# Patient Record
Sex: Female | Born: 1935 | Race: White | Hispanic: No | Smoking: Former smoker
Health system: Southern US, Community
[De-identification: ages and names within clinical notes are randomized; demographics above are authoritative.]

## PROBLEM LIST (undated history)

## (undated) DIAGNOSIS — B0229 Other postherpetic nervous system involvement: Secondary | ICD-10-CM

## (undated) DIAGNOSIS — G629 Polyneuropathy, unspecified: Secondary | ICD-10-CM

## (undated) DIAGNOSIS — E785 Hyperlipidemia, unspecified: Secondary | ICD-10-CM

## (undated) DIAGNOSIS — I4892 Unspecified atrial flutter: Secondary | ICD-10-CM

## (undated) DIAGNOSIS — M869 Osteomyelitis, unspecified: Secondary | ICD-10-CM

## (undated) DIAGNOSIS — I480 Paroxysmal atrial fibrillation: Secondary | ICD-10-CM

## (undated) DIAGNOSIS — R208 Other disturbances of skin sensation: Secondary | ICD-10-CM

## (undated) DIAGNOSIS — D649 Anemia, unspecified: Secondary | ICD-10-CM

## (undated) DIAGNOSIS — Z89621 Acquired absence of right hip joint: Secondary | ICD-10-CM

## (undated) DIAGNOSIS — N183 Chronic kidney disease, stage 3 unspecified: Secondary | ICD-10-CM

## (undated) DIAGNOSIS — I10 Essential (primary) hypertension: Secondary | ICD-10-CM

## (undated) DIAGNOSIS — S7290XA Unspecified fracture of unspecified femur, initial encounter for closed fracture: Secondary | ICD-10-CM

## (undated) DIAGNOSIS — I455 Other specified heart block: Secondary | ICD-10-CM

## (undated) DIAGNOSIS — I5032 Chronic diastolic (congestive) heart failure: Secondary | ICD-10-CM

## (undated) DIAGNOSIS — Z950 Presence of cardiac pacemaker: Secondary | ICD-10-CM

## (undated) DIAGNOSIS — G4730 Sleep apnea, unspecified: Secondary | ICD-10-CM

## (undated) DIAGNOSIS — K219 Gastro-esophageal reflux disease without esophagitis: Secondary | ICD-10-CM

## (undated) DIAGNOSIS — H9190 Unspecified hearing loss, unspecified ear: Secondary | ICD-10-CM

## (undated) DIAGNOSIS — K922 Gastrointestinal hemorrhage, unspecified: Secondary | ICD-10-CM

## (undated) DIAGNOSIS — I2510 Atherosclerotic heart disease of native coronary artery without angina pectoris: Secondary | ICD-10-CM

## (undated) DIAGNOSIS — E079 Disorder of thyroid, unspecified: Secondary | ICD-10-CM

## (undated) DIAGNOSIS — I495 Sick sinus syndrome: Secondary | ICD-10-CM

## (undated) HISTORY — PX: TUBAL LIGATION: SHX77

## (undated) HISTORY — DX: Atherosclerotic heart disease of native coronary artery without angina pectoris: I25.10

## (undated) HISTORY — PX: LEG AMPUTATION: SHX1105

## (undated) HISTORY — DX: Chronic kidney disease, stage 3 (moderate): N18.3

## (undated) HISTORY — DX: Polyneuropathy, unspecified: G62.9

## (undated) HISTORY — PX: SEPTOPLASTY: SUR1290

## (undated) HISTORY — DX: Acquired absence of right hip joint: Z89.621

## (undated) HISTORY — DX: Gastro-esophageal reflux disease without esophagitis: K21.9

## (undated) HISTORY — DX: Unspecified fracture of unspecified femur, initial encounter for closed fracture: S72.90XA

## (undated) HISTORY — DX: Presence of cardiac pacemaker: Z95.0

## (undated) HISTORY — DX: Chronic kidney disease, stage 3 unspecified: N18.30

## (undated) HISTORY — PX: APPENDECTOMY: SHX54

## (undated) HISTORY — PX: COLONOSCOPY: SHX174

## (undated) HISTORY — PX: ABDOMINAL HYSTERECTOMY: SHX81

## (undated) HISTORY — DX: Other disturbances of skin sensation: R20.8

## (undated) HISTORY — PX: EYE SURGERY: SHX253

## (undated) HISTORY — DX: Sick sinus syndrome: I49.5

## (undated) HISTORY — DX: Chronic diastolic (congestive) heart failure: I50.32

## (undated) HISTORY — DX: Osteomyelitis, unspecified: M86.9

## (undated) HISTORY — DX: Gastrointestinal hemorrhage, unspecified: K92.2

## (undated) HISTORY — DX: Other postherpetic nervous system involvement: B02.29

## (undated) HISTORY — DX: Disorder of thyroid, unspecified: E07.9

## (undated) HISTORY — DX: Anemia, unspecified: D64.9

---

## 1998-05-16 ENCOUNTER — Other Ambulatory Visit: Admission: RE | Admit: 1998-05-16 | Discharge: 1998-05-16 | Payer: Self-pay | Admitting: Obstetrics and Gynecology

## 1998-08-16 ENCOUNTER — Encounter: Payer: Self-pay | Admitting: Orthopedic Surgery

## 1998-08-21 ENCOUNTER — Encounter: Payer: Self-pay | Admitting: Orthopedic Surgery

## 1998-08-21 ENCOUNTER — Inpatient Hospital Stay (HOSPITAL_COMMUNITY): Admission: RE | Admit: 1998-08-21 | Discharge: 1998-08-27 | Payer: Self-pay | Admitting: Orthopedic Surgery

## 1999-07-07 ENCOUNTER — Other Ambulatory Visit: Admission: RE | Admit: 1999-07-07 | Discharge: 1999-07-07 | Payer: Self-pay | Admitting: Obstetrics and Gynecology

## 2001-02-23 ENCOUNTER — Ambulatory Visit (HOSPITAL_COMMUNITY): Admission: RE | Admit: 2001-02-23 | Discharge: 2001-02-23 | Payer: Self-pay | Admitting: Gastroenterology

## 2001-08-18 ENCOUNTER — Other Ambulatory Visit: Admission: RE | Admit: 2001-08-18 | Discharge: 2001-08-18 | Payer: Self-pay | Admitting: Obstetrics and Gynecology

## 2001-12-14 ENCOUNTER — Encounter: Admission: RE | Admit: 2001-12-14 | Discharge: 2002-03-14 | Payer: Self-pay | Admitting: Orthopedic Surgery

## 2002-03-15 ENCOUNTER — Encounter: Admission: RE | Admit: 2002-03-15 | Discharge: 2002-03-16 | Payer: Self-pay | Admitting: Orthopedic Surgery

## 2002-04-06 HISTORY — PX: KNEE ARTHROSCOPY: SUR90

## 2002-08-21 ENCOUNTER — Inpatient Hospital Stay (HOSPITAL_COMMUNITY): Admission: EM | Admit: 2002-08-21 | Discharge: 2002-08-23 | Payer: Self-pay | Admitting: Emergency Medicine

## 2002-08-21 ENCOUNTER — Encounter: Payer: Self-pay | Admitting: Emergency Medicine

## 2002-10-04 ENCOUNTER — Encounter: Payer: Self-pay | Admitting: Orthopedic Surgery

## 2002-10-06 ENCOUNTER — Inpatient Hospital Stay (HOSPITAL_COMMUNITY): Admission: RE | Admit: 2002-10-06 | Discharge: 2002-10-12 | Payer: Self-pay | Admitting: Orthopedic Surgery

## 2003-04-13 ENCOUNTER — Encounter: Admission: RE | Admit: 2003-04-13 | Discharge: 2003-04-13 | Payer: Self-pay | Admitting: Orthopedic Surgery

## 2003-04-16 ENCOUNTER — Ambulatory Visit (HOSPITAL_BASED_OUTPATIENT_CLINIC_OR_DEPARTMENT_OTHER): Admission: RE | Admit: 2003-04-16 | Discharge: 2003-04-16 | Payer: Self-pay | Admitting: Orthopedic Surgery

## 2003-04-16 ENCOUNTER — Ambulatory Visit (HOSPITAL_COMMUNITY): Admission: RE | Admit: 2003-04-16 | Discharge: 2003-04-16 | Payer: Self-pay | Admitting: Orthopedic Surgery

## 2003-06-13 ENCOUNTER — Ambulatory Visit (HOSPITAL_BASED_OUTPATIENT_CLINIC_OR_DEPARTMENT_OTHER): Admission: RE | Admit: 2003-06-13 | Discharge: 2003-06-13 | Payer: Self-pay | Admitting: Orthopedic Surgery

## 2003-06-13 ENCOUNTER — Ambulatory Visit (HOSPITAL_COMMUNITY): Admission: RE | Admit: 2003-06-13 | Discharge: 2003-06-13 | Payer: Self-pay | Admitting: Orthopedic Surgery

## 2004-02-07 ENCOUNTER — Ambulatory Visit: Payer: Self-pay | Admitting: Internal Medicine

## 2004-02-15 ENCOUNTER — Ambulatory Visit: Payer: Self-pay | Admitting: Internal Medicine

## 2004-04-06 HISTORY — PX: REPLACEMENT TOTAL KNEE: SUR1224

## 2004-08-20 ENCOUNTER — Ambulatory Visit: Payer: Self-pay | Admitting: Internal Medicine

## 2004-08-28 ENCOUNTER — Ambulatory Visit: Payer: Self-pay | Admitting: Internal Medicine

## 2005-03-05 ENCOUNTER — Ambulatory Visit: Payer: Self-pay | Admitting: Internal Medicine

## 2005-03-12 ENCOUNTER — Ambulatory Visit: Payer: Self-pay | Admitting: Internal Medicine

## 2005-08-24 ENCOUNTER — Ambulatory Visit: Payer: Self-pay | Admitting: Family Medicine

## 2005-09-01 ENCOUNTER — Ambulatory Visit: Payer: Self-pay | Admitting: Internal Medicine

## 2005-09-15 ENCOUNTER — Ambulatory Visit: Payer: Self-pay | Admitting: Internal Medicine

## 2005-09-25 ENCOUNTER — Ambulatory Visit: Payer: Self-pay | Admitting: Internal Medicine

## 2005-10-05 ENCOUNTER — Encounter: Admission: RE | Admit: 2005-10-05 | Discharge: 2005-10-05 | Payer: Self-pay | Admitting: Orthopedic Surgery

## 2005-10-06 ENCOUNTER — Ambulatory Visit (HOSPITAL_BASED_OUTPATIENT_CLINIC_OR_DEPARTMENT_OTHER): Admission: RE | Admit: 2005-10-06 | Discharge: 2005-10-06 | Payer: Self-pay | Admitting: Orthopedic Surgery

## 2005-10-06 ENCOUNTER — Encounter (INDEPENDENT_AMBULATORY_CARE_PROVIDER_SITE_OTHER): Payer: Self-pay | Admitting: Specialist

## 2005-10-08 ENCOUNTER — Emergency Department (HOSPITAL_COMMUNITY): Admission: EM | Admit: 2005-10-08 | Discharge: 2005-10-08 | Payer: Self-pay | Admitting: Emergency Medicine

## 2006-01-08 ENCOUNTER — Ambulatory Visit: Payer: Self-pay | Admitting: Internal Medicine

## 2006-04-20 ENCOUNTER — Ambulatory Visit: Payer: Self-pay | Admitting: Internal Medicine

## 2006-04-20 LAB — CONVERTED CEMR LAB
AST: 30 units/L (ref 0–37)
LDL Cholesterol: 101 mg/dL — ABNORMAL HIGH (ref 0–99)
Total CHOL/HDL Ratio: 3.2
Triglycerides: 162 mg/dL — ABNORMAL HIGH (ref 0–149)
VLDL: 32 mg/dL (ref 0–40)

## 2006-04-27 ENCOUNTER — Ambulatory Visit: Payer: Self-pay | Admitting: Internal Medicine

## 2006-10-27 ENCOUNTER — Ambulatory Visit: Payer: Self-pay | Admitting: Internal Medicine

## 2006-11-03 ENCOUNTER — Ambulatory Visit: Payer: Self-pay | Admitting: Internal Medicine

## 2006-11-03 DIAGNOSIS — E785 Hyperlipidemia, unspecified: Secondary | ICD-10-CM

## 2006-11-03 DIAGNOSIS — E039 Hypothyroidism, unspecified: Secondary | ICD-10-CM

## 2006-11-03 LAB — CONVERTED CEMR LAB
AST: 32 units/L (ref 0–37)
BUN: 36 mg/dL — ABNORMAL HIGH (ref 6–23)
Creatinine, Ser: 1 mg/dL (ref 0.4–1.2)
HDL: 50.1 mg/dL (ref >39.0)
Hgb A1c MFr Bld: 6.1 % — ABNORMAL HIGH (ref 4.6–6.0)
Potassium: 4.2 meq/L (ref 3.5–5.1)
Triglycerides: 195 mg/dL — ABNORMAL HIGH (ref 0–149)
VLDL: 39 mg/dL (ref 0–40)

## 2006-11-08 ENCOUNTER — Telehealth (INDEPENDENT_AMBULATORY_CARE_PROVIDER_SITE_OTHER): Payer: Self-pay | Admitting: *Deleted

## 2006-12-22 ENCOUNTER — Encounter: Payer: Self-pay | Admitting: Internal Medicine

## 2006-12-30 ENCOUNTER — Encounter: Payer: Self-pay | Admitting: Internal Medicine

## 2007-02-01 ENCOUNTER — Ambulatory Visit: Payer: Self-pay | Admitting: Internal Medicine

## 2007-02-02 ENCOUNTER — Encounter (INDEPENDENT_AMBULATORY_CARE_PROVIDER_SITE_OTHER): Payer: Self-pay | Admitting: *Deleted

## 2007-02-07 ENCOUNTER — Telehealth (INDEPENDENT_AMBULATORY_CARE_PROVIDER_SITE_OTHER): Payer: Self-pay | Admitting: *Deleted

## 2007-03-22 ENCOUNTER — Encounter: Payer: Self-pay | Admitting: Internal Medicine

## 2007-05-02 ENCOUNTER — Telehealth (INDEPENDENT_AMBULATORY_CARE_PROVIDER_SITE_OTHER): Payer: Self-pay | Admitting: *Deleted

## 2007-06-03 ENCOUNTER — Telehealth (INDEPENDENT_AMBULATORY_CARE_PROVIDER_SITE_OTHER): Payer: Self-pay | Admitting: *Deleted

## 2007-07-20 ENCOUNTER — Ambulatory Visit: Payer: Self-pay | Admitting: Internal Medicine

## 2007-07-20 ENCOUNTER — Telehealth: Payer: Self-pay | Admitting: Internal Medicine

## 2007-07-20 ENCOUNTER — Encounter (INDEPENDENT_AMBULATORY_CARE_PROVIDER_SITE_OTHER): Payer: Self-pay | Admitting: *Deleted

## 2007-07-31 ENCOUNTER — Encounter (INDEPENDENT_AMBULATORY_CARE_PROVIDER_SITE_OTHER): Payer: Self-pay | Admitting: *Deleted

## 2007-07-31 LAB — CONVERTED CEMR LAB: Hgb A1c MFr Bld: 6 % (ref 4.6–6.0)

## 2007-08-03 ENCOUNTER — Telehealth: Payer: Self-pay | Admitting: Internal Medicine

## 2007-08-11 ENCOUNTER — Ambulatory Visit: Payer: Self-pay | Admitting: Internal Medicine

## 2007-09-01 ENCOUNTER — Telehealth (INDEPENDENT_AMBULATORY_CARE_PROVIDER_SITE_OTHER): Payer: Self-pay | Admitting: *Deleted

## 2007-09-01 ENCOUNTER — Encounter (INDEPENDENT_AMBULATORY_CARE_PROVIDER_SITE_OTHER): Payer: Self-pay | Admitting: *Deleted

## 2007-11-28 ENCOUNTER — Ambulatory Visit: Payer: Self-pay | Admitting: Internal Medicine

## 2007-11-28 DIAGNOSIS — K219 Gastro-esophageal reflux disease without esophagitis: Secondary | ICD-10-CM | POA: Insufficient documentation

## 2007-11-28 DIAGNOSIS — I10 Essential (primary) hypertension: Secondary | ICD-10-CM

## 2007-11-28 DIAGNOSIS — R7309 Other abnormal glucose: Secondary | ICD-10-CM

## 2007-11-29 ENCOUNTER — Encounter (INDEPENDENT_AMBULATORY_CARE_PROVIDER_SITE_OTHER): Payer: Self-pay | Admitting: *Deleted

## 2007-12-02 ENCOUNTER — Encounter (INDEPENDENT_AMBULATORY_CARE_PROVIDER_SITE_OTHER): Payer: Self-pay | Admitting: Obstetrics and Gynecology

## 2007-12-02 ENCOUNTER — Ambulatory Visit (HOSPITAL_COMMUNITY): Admission: RE | Admit: 2007-12-02 | Discharge: 2007-12-02 | Payer: Self-pay | Admitting: Obstetrics and Gynecology

## 2007-12-20 ENCOUNTER — Encounter (INDEPENDENT_AMBULATORY_CARE_PROVIDER_SITE_OTHER): Payer: Self-pay | Admitting: *Deleted

## 2007-12-20 ENCOUNTER — Ambulatory Visit: Payer: Self-pay | Admitting: Internal Medicine

## 2007-12-20 LAB — CONVERTED CEMR LAB
OCCULT 1: NEGATIVE
OCCULT 3: NEGATIVE

## 2008-01-25 ENCOUNTER — Ambulatory Visit: Payer: Self-pay | Admitting: Internal Medicine

## 2008-05-30 ENCOUNTER — Ambulatory Visit: Payer: Self-pay | Admitting: Internal Medicine

## 2008-05-30 LAB — CONVERTED CEMR LAB
LDL Cholesterol: 93 mg/dL (ref 0–99)
Total CHOL/HDL Ratio: 2.8
Triglycerides: 122 mg/dL (ref 0–149)

## 2008-06-04 ENCOUNTER — Ambulatory Visit: Payer: Self-pay | Admitting: Internal Medicine

## 2008-06-09 LAB — CONVERTED CEMR LAB: Hgb A1c MFr Bld: 6.2 % — ABNORMAL HIGH (ref 4.6–6.0)

## 2008-06-12 ENCOUNTER — Encounter (INDEPENDENT_AMBULATORY_CARE_PROVIDER_SITE_OTHER): Payer: Self-pay | Admitting: *Deleted

## 2008-12-06 ENCOUNTER — Ambulatory Visit: Payer: Self-pay | Admitting: Internal Medicine

## 2008-12-06 ENCOUNTER — Telehealth (INDEPENDENT_AMBULATORY_CARE_PROVIDER_SITE_OTHER): Payer: Self-pay | Admitting: *Deleted

## 2008-12-06 LAB — CONVERTED CEMR LAB
Cholesterol: 271 mg/dL — ABNORMAL HIGH (ref 0–200)
Direct LDL: 180.3 mg/dL
Hgb A1c MFr Bld: 5.9 % (ref 4.6–6.5)

## 2008-12-07 ENCOUNTER — Ambulatory Visit: Payer: Self-pay | Admitting: Internal Medicine

## 2008-12-07 ENCOUNTER — Telehealth (INDEPENDENT_AMBULATORY_CARE_PROVIDER_SITE_OTHER): Payer: Self-pay | Admitting: *Deleted

## 2008-12-13 ENCOUNTER — Ambulatory Visit: Payer: Self-pay | Admitting: Internal Medicine

## 2008-12-17 ENCOUNTER — Encounter: Payer: Self-pay | Admitting: Internal Medicine

## 2008-12-21 ENCOUNTER — Ambulatory Visit: Payer: Self-pay | Admitting: Internal Medicine

## 2008-12-21 DIAGNOSIS — M549 Dorsalgia, unspecified: Secondary | ICD-10-CM | POA: Insufficient documentation

## 2008-12-21 LAB — CONVERTED CEMR LAB
Bilirubin Urine: NEGATIVE
Nitrite: NEGATIVE

## 2008-12-27 ENCOUNTER — Telehealth (INDEPENDENT_AMBULATORY_CARE_PROVIDER_SITE_OTHER): Payer: Self-pay | Admitting: *Deleted

## 2009-01-16 ENCOUNTER — Telehealth (INDEPENDENT_AMBULATORY_CARE_PROVIDER_SITE_OTHER): Payer: Self-pay | Admitting: *Deleted

## 2009-02-01 ENCOUNTER — Encounter (INDEPENDENT_AMBULATORY_CARE_PROVIDER_SITE_OTHER): Payer: Self-pay | Admitting: *Deleted

## 2009-02-08 ENCOUNTER — Telehealth (INDEPENDENT_AMBULATORY_CARE_PROVIDER_SITE_OTHER): Payer: Self-pay | Admitting: *Deleted

## 2009-03-04 ENCOUNTER — Ambulatory Visit: Payer: Self-pay | Admitting: Internal Medicine

## 2009-03-12 ENCOUNTER — Encounter (INDEPENDENT_AMBULATORY_CARE_PROVIDER_SITE_OTHER): Payer: Self-pay | Admitting: *Deleted

## 2009-03-12 LAB — CONVERTED CEMR LAB
ALT: 25 units/L (ref 0–35)
AST: 30 units/L (ref 0–37)
Alkaline Phosphatase: 60 units/L (ref 39–117)
Cholesterol: 192 mg/dL (ref 0–200)
Total CHOL/HDL Ratio: 3

## 2009-03-22 ENCOUNTER — Ambulatory Visit: Payer: Self-pay | Admitting: Internal Medicine

## 2009-04-18 ENCOUNTER — Telehealth (INDEPENDENT_AMBULATORY_CARE_PROVIDER_SITE_OTHER): Payer: Self-pay | Admitting: *Deleted

## 2009-04-30 ENCOUNTER — Telehealth (INDEPENDENT_AMBULATORY_CARE_PROVIDER_SITE_OTHER): Payer: Self-pay | Admitting: *Deleted

## 2009-05-27 ENCOUNTER — Telehealth (INDEPENDENT_AMBULATORY_CARE_PROVIDER_SITE_OTHER): Payer: Self-pay | Admitting: *Deleted

## 2009-06-26 ENCOUNTER — Telehealth (INDEPENDENT_AMBULATORY_CARE_PROVIDER_SITE_OTHER): Payer: Self-pay | Admitting: *Deleted

## 2009-08-01 ENCOUNTER — Telehealth (INDEPENDENT_AMBULATORY_CARE_PROVIDER_SITE_OTHER): Payer: Self-pay | Admitting: *Deleted

## 2009-08-27 ENCOUNTER — Telehealth (INDEPENDENT_AMBULATORY_CARE_PROVIDER_SITE_OTHER): Payer: Self-pay | Admitting: *Deleted

## 2009-09-05 ENCOUNTER — Ambulatory Visit: Payer: Self-pay | Admitting: Internal Medicine

## 2009-09-05 DIAGNOSIS — R5381 Other malaise: Secondary | ICD-10-CM | POA: Insufficient documentation

## 2009-09-05 DIAGNOSIS — G479 Sleep disorder, unspecified: Secondary | ICD-10-CM | POA: Insufficient documentation

## 2009-09-05 DIAGNOSIS — R5383 Other fatigue: Secondary | ICD-10-CM

## 2009-09-12 LAB — CONVERTED CEMR LAB
ALT: 26 units/L (ref 0–35)
AST: 31 units/L (ref 0–37)
Basophils Absolute: 0 10*3/uL (ref 0.0–0.1)
Basophils Relative: 0.6 % (ref 0.0–3.0)
Bilirubin, Direct: 0.1 mg/dL (ref 0.0–0.3)
Cholesterol: 248 mg/dL — ABNORMAL HIGH (ref 0–200)
Eosinophils Absolute: 0.1 10*3/uL (ref 0.0–0.7)
Eosinophils Relative: 2.4 % (ref 0.0–5.0)
HDL: 78.6 mg/dL (ref >39.00)
Lymphocytes Relative: 38.7 % (ref 12.0–46.0)
Lymphs Abs: 2.3 10*3/uL (ref 0.7–4.0)
MCHC: 34.4 g/dL (ref 30.0–36.0)
Monocytes Relative: 8.5 % (ref 3.0–12.0)
Neutro Abs: 3 10*3/uL (ref 1.4–7.7)
Neutrophils Relative %: 49.8 % (ref 43.0–77.0)
Platelets: 285 10*3/uL (ref 150.0–400.0)
Potassium: 4.2 meq/L (ref 3.5–5.1)
RBC: 3.88 M/uL (ref 3.87–5.11)
Total Protein: 6.8 g/dL (ref 6.0–8.3)
Triglycerides: 153 mg/dL — ABNORMAL HIGH (ref 0.0–149.0)
VLDL: 30.6 mg/dL (ref 0.0–40.0)

## 2009-09-16 ENCOUNTER — Encounter: Payer: Self-pay | Admitting: Internal Medicine

## 2009-09-20 ENCOUNTER — Ambulatory Visit: Payer: Self-pay | Admitting: Pulmonary Disease

## 2009-09-20 DIAGNOSIS — R0989 Other specified symptoms and signs involving the circulatory and respiratory systems: Secondary | ICD-10-CM | POA: Insufficient documentation

## 2009-09-20 DIAGNOSIS — G4710 Hypersomnia, unspecified: Secondary | ICD-10-CM

## 2009-09-20 DIAGNOSIS — R0609 Other forms of dyspnea: Secondary | ICD-10-CM

## 2009-09-27 ENCOUNTER — Ambulatory Visit: Payer: Self-pay | Admitting: Pulmonary Disease

## 2009-09-30 ENCOUNTER — Ambulatory Visit: Payer: Self-pay | Admitting: Internal Medicine

## 2009-09-30 ENCOUNTER — Inpatient Hospital Stay (HOSPITAL_COMMUNITY): Admission: EM | Admit: 2009-09-30 | Discharge: 2009-10-01 | Payer: Self-pay | Admitting: Emergency Medicine

## 2009-10-01 ENCOUNTER — Encounter: Payer: Self-pay | Admitting: Internal Medicine

## 2009-10-02 DIAGNOSIS — G4733 Obstructive sleep apnea (adult) (pediatric): Secondary | ICD-10-CM

## 2009-10-09 ENCOUNTER — Ambulatory Visit: Payer: Self-pay | Admitting: Pulmonary Disease

## 2009-10-09 DIAGNOSIS — I4891 Unspecified atrial fibrillation: Secondary | ICD-10-CM

## 2009-10-14 ENCOUNTER — Ambulatory Visit: Payer: Self-pay | Admitting: Internal Medicine

## 2009-10-14 ENCOUNTER — Ambulatory Visit: Payer: Self-pay

## 2009-10-14 DIAGNOSIS — R002 Palpitations: Secondary | ICD-10-CM | POA: Insufficient documentation

## 2009-11-13 ENCOUNTER — Ambulatory Visit: Payer: Self-pay | Admitting: Pulmonary Disease

## 2009-12-02 ENCOUNTER — Ambulatory Visit: Payer: Self-pay | Admitting: Internal Medicine

## 2009-12-13 ENCOUNTER — Encounter: Payer: Self-pay | Admitting: Internal Medicine

## 2010-01-01 ENCOUNTER — Ambulatory Visit: Payer: Self-pay | Admitting: Pulmonary Disease

## 2010-01-24 ENCOUNTER — Telehealth: Payer: Self-pay | Admitting: Pulmonary Disease

## 2010-02-12 ENCOUNTER — Encounter: Payer: Self-pay | Admitting: Internal Medicine

## 2010-02-27 ENCOUNTER — Encounter: Payer: Self-pay | Admitting: Pulmonary Disease

## 2010-03-13 ENCOUNTER — Encounter: Payer: Self-pay | Admitting: Pulmonary Disease

## 2010-03-18 ENCOUNTER — Ambulatory Visit: Payer: Self-pay | Admitting: Internal Medicine

## 2010-03-24 ENCOUNTER — Encounter: Payer: Self-pay | Admitting: Pulmonary Disease

## 2010-03-27 ENCOUNTER — Telehealth (INDEPENDENT_AMBULATORY_CARE_PROVIDER_SITE_OTHER): Payer: Self-pay | Admitting: *Deleted

## 2010-03-27 LAB — CONVERTED CEMR LAB
ALT: 25 units/L (ref 0–35)
Alkaline Phosphatase: 54 units/L (ref 39–117)
Direct LDL: 120.3 mg/dL
HDL: 62 mg/dL (ref >39.00)
Total CHOL/HDL Ratio: 4
Total Protein: 6.4 g/dL (ref 6.0–8.3)
Triglycerides: 188 mg/dL — ABNORMAL HIGH (ref 0.0–149.0)

## 2010-04-01 ENCOUNTER — Ambulatory Visit: Payer: Self-pay | Admitting: Internal Medicine

## 2010-04-01 DIAGNOSIS — E119 Type 2 diabetes mellitus without complications: Secondary | ICD-10-CM

## 2010-04-02 ENCOUNTER — Telehealth: Payer: Self-pay | Admitting: Internal Medicine

## 2010-04-11 ENCOUNTER — Encounter: Payer: Self-pay | Admitting: Pulmonary Disease

## 2010-04-15 ENCOUNTER — Encounter: Payer: Self-pay | Admitting: Pulmonary Disease

## 2010-04-15 ENCOUNTER — Ambulatory Visit (HOSPITAL_BASED_OUTPATIENT_CLINIC_OR_DEPARTMENT_OTHER)
Admission: RE | Admit: 2010-04-15 | Discharge: 2010-04-15 | Payer: Self-pay | Source: Home / Self Care | Attending: Pulmonary Disease | Admitting: Pulmonary Disease

## 2010-04-17 ENCOUNTER — Ambulatory Visit
Admission: RE | Admit: 2010-04-17 | Discharge: 2010-04-17 | Payer: Self-pay | Source: Home / Self Care | Attending: Pulmonary Disease | Admitting: Pulmonary Disease

## 2010-05-02 ENCOUNTER — Telehealth (INDEPENDENT_AMBULATORY_CARE_PROVIDER_SITE_OTHER): Payer: Self-pay | Admitting: *Deleted

## 2010-05-04 LAB — CONVERTED CEMR LAB
ALT: 29 units/L (ref 0–35)
AST: 34 units/L (ref 0–37)
Albumin: 4.1 g/dL (ref 3.5–5.2)
Basophils Absolute: 0.1 10*3/uL (ref 0.0–0.1)
Bilirubin, Direct: 0.1 mg/dL (ref 0.0–0.3)
CO2: 33 meq/L — ABNORMAL HIGH (ref 19–32)
Calcium: 9.8 mg/dL (ref 8.4–10.5)
Cholesterol, target level: 200 mg/dL
Direct LDL: 133 mg/dL
Eosinophils Absolute: 0.1 10*3/uL (ref 0.0–0.7)
GFR calc Af Amer: 78 mL/min
Glucose, Bld: 118 mg/dL — ABNORMAL HIGH (ref 70–99)
HDL: 64.2 mg/dL (ref >39.0)
Hgb A1c MFr Bld: 5.9 % (ref 4.6–6.0)
LDL Goal: 100 mg/dL
MCHC: 33.8 g/dL (ref 30.0–36.0)
Neutro Abs: 5.5 10*3/uL (ref 1.4–7.7)
Platelets: 247 10*3/uL (ref 150–400)
Sodium: 141 meq/L (ref 135–145)
TSH: 1.97 microintl units/mL (ref 0.35–5.50)
Total Bilirubin: 0.8 mg/dL (ref 0.3–1.2)

## 2010-05-08 ENCOUNTER — Other Ambulatory Visit: Payer: Self-pay | Admitting: Dermatology

## 2010-05-08 NOTE — Letter (Signed)
Summary: [facility]   Imported By: Lanelle Bal 02/25/2010 09:47:12  _____________________________________________________________________  External Attachment:    Type:   Image     Comment:   External Document

## 2010-05-08 NOTE — Assessment & Plan Note (Signed)
Summary: rov to review cpap titration study    Copy to:  Marga Melnick Primary Provider/Referring Provider:  Marga Melnick MD  CC:  Pt is here for a f/u appt to discuss sleep study  results. .  History of Present Illness: the pt comes in today for f/u of her recent cpap titration study.  She has known osa, and has not responded as expected to cpap treatment.  She underwent titration study 04/15/10, and surprisingly found to have fairly high pressure needs.  She was changed over to bilevel at 17/13, and ultimately required 21/17 to eliminate apneas with subsequent REM rebound.  I have reviewed the study with her in detail, and answered all of her questions.   Medications Prior to Update: 1)  Levothyroxine Sodium 100 Mcg Tabs (Levothyroxine Sodium) .Marland Kitchen.. 1 By Mouth Qd 2)  Demadex 20 Mg  Tabs (Torsemide) .Marland Kitchen.. 1 By Mouth Qd 3)  Omeprazole 40 Mg Cpdr (Omeprazole) .... Take 1 Tablet By Mouth Once A Day 4)  Singulair 10 Mg  Tabs (Montelukast Sodium) .Marland Kitchen.. 1 By Mouth Qd 5)  Avapro 300 Mg  Tabs (Irbesartan) .Marland Kitchen.. 1 By Mouth Qd 6)  Premarin 0.3 Mg  Tabs (Estrogens Conjugated) .Marland Kitchen.. 1 By Mouth Qd 7)  Fish Oil 1000 Mg  Caps (Omega-3 Fatty Acids) .... Take 1 Capsule By Mouth Once A Day 8)  Calcium/vit D .... Bid 9)  Multivitamins   Tabs (Multiple Vitamin) .... Take 1 Tablet By Mouth Once A Day 10)  Iron 325 (65 Fe) Mg Tabs (Ferrous Sulfate) .... Take 1 Tablet By Mouth Once A Day 11)  Metoprolol Tartrate 50 Mg  Tabs (Metoprolol Tartrate) .Marland Kitchen.. 1 By Mouth Bid 12)  Amlodipine Besylate 10 Mg Tabs (Amlodipine Besylate) .... One By Mouth Daily 13)  Coq10 150 Mg Caps (Coenzyme Q10) .Marland Kitchen.. 1 By Mouth Once Daily 14)  Aspirin 81 Mg Tabs (Aspirin) .Marland Kitchen.. 1 By Mouth Once Daily 15)  Lorazepam 0.5 Mg Tabs (Lorazepam) .Marland Kitchen.. 1-2 At Bedtime As Needed 16)  Simvastatin 40 Mg Tabs (Simvastatin) .... 1/2 Tablet Po At Bedtime 17)  Glucosamine 500 Mg Caps (Glucosamine Sulfate) .Marland Kitchen.. 1 By Mouth Two Times A Day 18)  Align  Caps  (Probiotic Product) .... Take 1 Capsule By Mouth Once A Day  Allergies (verified): 1)  ! * Latex  Review of Systems       The patient complains of sneezing, itching, hand/feet swelling, and joint stiffness or pain.  The patient denies shortness of breath with activity, shortness of breath at rest, productive cough, non-productive cough, coughing up blood, chest pain, irregular heartbeats, acid heartburn, indigestion, loss of appetite, weight change, abdominal pain, difficulty swallowing, sore throat, tooth/dental problems, headaches, nasal congestion/difficulty breathing through nose, ear ache, anxiety, depression, rash, change in color of mucus, and fever.    Vital Signs:  Patient profile:   77 year old female Height:      63 inches Weight:      139.38 pounds BMI:     24.78 O2 Sat:      97 % on Room air Temp:     97.9 degrees F oral Pulse rate:   68 / minute BP sitting:   130 / 70  (left arm) Cuff size:   regular  Vitals Entered By: Arman Filter LPN (April 17, 2010 2:57 PM)  O2 Flow:  Room air CC: Pt is here for a f/u appt to discuss sleep study  results.  Comments Medications reviewed with patient Arman Filter LPN  April 17, 2010 2:57 PM    Physical Exam  General:  wd female in nad Nose:  no skin breakdown or pressure necrosis from cpap mask Extremities:  no significant edema, no cyanosis  Neurologic:  alert, oriented, moves all 4.   Impression & Recommendations:  Problem # 1:  OBSTRUCTIVE SLEEP APNEA (ICD-327.23) the pt was found to have fairly high pressure needs for adequate treatment of her osa.  Will need to try her on bilevel, and see if she can tolerate the recommended pressure.  Will start a little lower to allow for desensitization, and then try to get her to therapeutic level.  Will also change over to the mask recommended at the sleep center fitting.  Other Orders: Est. Patient Level III (91478) DME Referral (DME)  Patient Instructions: 1)  will  change your cpap machine to a bipap machine at 18/14, but will try to increase to 21/17 in 2-3 weeks.  Let me know if tolerance issue. 2)  change mask to a small/medium respironics comfort gel full face mask. 3)  followup with me in 8 weeks, but again, let me know if tolerance issues.    Immunization History:  Influenza Immunization History:    Influenza:  historical (04/06/2010)
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
## 2010-05-08 NOTE — Assessment & Plan Note (Signed)
Summary: home sleep testing shows moderate to severe osa   History of Present Illness: the pt has undergone home sleep testing with a type 3 device.  I have reviewed the data and tracings with the following findings:  1) flow evaluation period of 6h 2) 114 obstructive apneas, and 113 obstructive hypopneas, for AHI 36/hr. 3) desat to as low as 75%, with less than or equal to 88%.  Allergies: 1)  ! * Latex   Impression & Recommendations:  Problem # 1:  OBSTRUCTIVE SLEEP APNEA (ICD-327.23)  the pt has moderate to severe osa by her home study, and will need aggressive treatment.  Will arrange for office visit to discuss results and to talk about treatment options.  Other Orders: Sleep Std Airflow/Heartrate and O2 SAT unattended (04540)  Appended Document: home sleep testing shows moderate to severe osa megan, pt needs ov to discuss sleep study results.  let her know she does have sleep apnea  Appended Document: home sleep testing shows moderate to severe osa called and spoke with pt.  pt scheduled to see [facility] 10-09-2009 at 4pm

## 2010-05-08 NOTE — Assessment & Plan Note (Signed)
Summary: followup to discuss labs///sph   Vital Signs:  Patient profile:   77 year old female Weight:      141 pounds BMI:     25.07 Pulse rate:   60 / minute Resp:     15 per minute BP sitting:   140 / 70  (left arm) Cuff size:   regular  Vitals Entered By: Shonna Chock CMA (April 01, 2010 3:30 PM) CC: Follow-up visit: discuss labs (copy given) , Type 2 diabetes mellitus follow-up   Primary Care Provider:  Marga Melnick MD  CC:  Follow-up visit: discuss labs (copy given)  and Type 2 diabetes mellitus follow-up.  History of Present Illness:      This is an 77 year old woman who presents for Hyperlipidemia follow-up; Dr Johney Frame decreased her Simvastatiin from 40 mg to 20 mg during overnight stay for AF demonstated on EMT strips.  The patient reports itching and fatigue ( over holiday), but denies muscle aches, GI upset, abdominal pain, flushing, constipation, and diarrhea.  Other symptoms include pedal edema.  The patient denies the following symptoms: chest pain/pressure, exercise intolerance, dypsnea, palpitations, and syncope.  Compliance with medications (by patient report) has been near 100%.  Dietary compliance has been good.  The patient reports exercising 3 X per week.  Adjunctive measures currently used by the patient include fiber, ASA, fish oil supplements, and Co-Q 10. LDL 120, but TSH is 14.33.   Type 2 Diabetes Mellitus Follow-Up      The patient is also here for Type 2 diabetes mellitus follow-up.  A1c  is  6.3%. The patient reports polyuria and weight gain of 2 # , but denies polydipsia, blurred vision, and self managed hypoglycemia.  The patient denies the following symptoms: vomiting, orthostatic symptoms, poor wound healing, vision loss, and foot ulcer.  The patient has been measuring capillary blood glucose before breakfast, range 93-104.    Current Medications (verified): 1)  Synthroid 75 Mcg  Tabs (Levothyroxine Sodium) .Marland Kitchen.. 1 By Mouth Qd 2)  Demadex 20 Mg  Tabs  (Torsemide) .Marland Kitchen.. 1 By Mouth Qd 3)  Omeprazole 40 Mg Cpdr (Omeprazole) .... Take 1 Tablet By Mouth Once A Day 4)  Singulair 10 Mg  Tabs (Montelukast Sodium) .Marland Kitchen.. 1 By Mouth Qd 5)  Avapro 300 Mg  Tabs (Irbesartan) .Marland Kitchen.. 1 By Mouth Qd 6)  Premarin 0.3 Mg  Tabs (Estrogens Conjugated) .Marland Kitchen.. 1 By Mouth Qd 7)  Fish Oil 1000 Mg  Caps (Omega-3 Fatty Acids) .... Take 1 Capsule By Mouth Once A Day 8)  Calcium/vit D .... Bid 9)  Multivitamins   Tabs (Multiple Vitamin) .... Take 1 Tablet By Mouth Once A Day 10)  Iron 325 (65 Fe) Mg Tabs (Ferrous Sulfate) .... Take 1 Tablet By Mouth Once A Day 11)  Metoprolol Tartrate 50 Mg  Tabs (Metoprolol Tartrate) .Marland Kitchen.. 1 By Mouth Bid 12)  Amlodipine Besylate 10 Mg Tabs (Amlodipine Besylate) .... One By Mouth Daily 13)  Coq10 150 Mg Caps (Coenzyme Q10) .Marland Kitchen.. 1 By Mouth Once Daily 14)  Aspirin 81 Mg Tabs (Aspirin) .Marland Kitchen.. 1 By Mouth Once Daily 15)  Lorazepam 0.5 Mg Tabs (Lorazepam) .Marland Kitchen.. 1-2 At Bedtime As Needed 16)  Simvastatin 40 Mg Tabs (Simvastatin) .... 1/2 Tablet Po At Bedtime 17)  Glucosamine 500 Mg Caps (Glucosamine Sulfate) .Marland Kitchen.. 1 By Mouth Two Times A Day 18)  Align  Caps (Probiotic Product) .... Take 1 Capsule By Mouth Once A Day  Allergies: 1)  ! * Latex  Review of Systems Eyes:  Denies double vision and vision loss-both eyes. ENT:  Denies difficulty swallowing and hoarseness. Derm:  Complains of changes in nail beds and hair loss; denies dryness and lesion(s). Neuro:  Complains of numbness; denies tingling; RUE numbness despite CTS surgery. Endo:  Denies cold intolerance and heat intolerance.  Physical Exam  General:  well-nourished;alert,appropriate and cooperative throughout examination Neck:  No deformities, masses, or tenderness noted. Lungs:  Normal respiratory effort, chest expands symmetrically. Lungs are clear to auscultation, no crackles or wheezes. Heart:  Normal rate and regular rhythm. S1 and S2 normal without gallop, murmur, click, rub .  S4 Pulses:  R and L carotid,radial, L dorsalis pedis and posterior tibial pulse  are full  Extremities:  trace left pedal edema.   Neurologic:  alert & oriented X3 and sensation intact to light touch over L foot.   Skin:  Intact without suspicious lesions or rashes Psych:  memory intact for recent and remote, normally interactive, and good eye contact.     Impression & Recommendations:  Problem # 1:  DIABETES MELLITUS, TYPE II, CONTROLLED (ICD-250.00)  Her updated medication list for this problem includes:    Avapro 300 Mg Tabs (Irbesartan) .Marland Kitchen... 1 by mouth qd    Aspirin 81 Mg Tabs (Aspirin) .Marland Kitchen... 1 by mouth once daily  Problem # 2:  HYPOTHYROIDISM (ICD-244.9) not corrected  Problem # 3:  HYPERLIPIDEMIA NEC/NOS (ICD-272.4) LDL not @ goal but HYPOthyroidism uncorrected Her updated medication list for this problem includes:    Simvastatin 40 Mg Tabs (Simvastatin) .Marland Kitchen... 1/2 tablet po at bedtime  Problem # 4:  ATRIAL FIBRILLATION (ICD-427.31) RR @ present Her updated medication list for this problem includes:    Metoprolol Tartrate 50 Mg Tabs (Metoprolol tartrate) .Marland Kitchen... 1 by mouth bid    Amlodipine Besylate 10 Mg Tabs (Amlodipine besylate) ..... One by mouth daily    Aspirin 81 Mg Tabs (Aspirin) .Marland Kitchen... 1 by mouth once daily  Problem # 5:  OBSTRUCTIVE SLEEP APNEA (ICD-327.23) titration studies pending  Complete Medication List: 1)  Levothyroxine Sodium 100 Mcg  .Marland KitchenMarland Kitchen. 1 by mouth qd 2)  Demadex 20 Mg Tabs (Torsemide) .Marland Kitchen.. 1 by mouth qd 3)  Omeprazole 40 Mg Cpdr (Omeprazole) .... Take 1 tablet by mouth once a day 4)  Singulair 10 Mg Tabs (Montelukast sodium) .Marland Kitchen.. 1 by mouth qd 5)  Avapro 300 Mg Tabs (Irbesartan) .Marland Kitchen.. 1 by mouth qd 6)  Premarin 0.3 Mg Tabs (Estrogens conjugated) .Marland Kitchen.. 1 by mouth qd 7)  Fish Oil 1000 Mg Caps (Omega-3 fatty acids) .... Take 1 capsule by mouth once a day 8)  Calcium/vit D  .... Bid 9)  Multivitamins Tabs (Multiple vitamin) .... Take 1 tablet by mouth once  a day 10)  Iron 325 (65 Fe) Mg Tabs (Ferrous sulfate) .... Take 1 tablet by mouth once a day 11)  Metoprolol Tartrate 50 Mg Tabs (Metoprolol tartrate) .Marland Kitchen.. 1 by mouth bid 12)  Amlodipine Besylate 10 Mg Tabs (Amlodipine besylate) .... One by mouth daily 13)  Coq10 150 Mg Caps (coenzyme Q10)  .Marland Kitchen.. 1 by mouth once daily 14)  Aspirin 81 Mg Tabs (Aspirin) .Marland Kitchen.. 1 by mouth once daily 15)  Lorazepam 0.5 Mg Tabs (Lorazepam) .Marland Kitchen.. 1-2 at bedtime as needed 16)  Simvastatin 40 Mg Tabs (Simvastatin) .... 1/2 tablet po at bedtime 17)  Glucosamine 500 Mg Caps (Glucosamine sulfate) .Marland Kitchen.. 1 by mouth two times a day 18)  Align Caps (Probiotic product) .... Take 1 capsule by  mouth once a day  Patient Instructions: 1)  Please schedule a follow-up  Lab appointment in  8 weeks for  2)  TSH (244.9). Repeat Lipids  & A1c after thyroid function has been normal for > 3 months. Prescriptions: LORAZEPAM 0.5 MG TABS (LORAZEPAM) 1-2 at bedtime as needed  #90 x 1   Entered and Authorized by:   Marga Melnick MD   Signed by:   Marga Melnick MD on 04/01/2010   Method used:   Print then Give to Patient   RxID:   [id number] LEVOTHYROXINE SODIUM 100 MCG 1 by mouth qd  #90 x 1   Entered and Authorized by:   Marga Melnick MD   Signed by:   Marga Melnick MD on 04/01/2010   Method used:   Print then Give to Patient   RxID:   [id number]    Orders Added: 1)  Est. Patient Level IV [13244]

## 2010-05-08 NOTE — Progress Notes (Signed)
Summary: simvastatin  Phone Note Refill Request Message from:  Fax from Pharmacy on May 27, 2009 12:33 PM  Refills Requested: Medication #1:  SIMVASTATIN 40 MG TABS 1 at bedtime. Initial call taken by: Doristine Devoid,  May 27, 2009 12:33 PM    Prescriptions: SIMVASTATIN 40 MG TABS (SIMVASTATIN) 1 at bedtime  #90 x 0   Entered by:   Doristine Devoid   Authorized by:   Marga Melnick MD   Signed by:   Doristine Devoid on 05/27/2009   Method used:   Electronically to        [facility]* (mail-order)             ,          Ph: [phone]       Fax: [phone]   RxID:   [id number]

## 2010-05-08 NOTE — Progress Notes (Signed)
Summary: Levothyroxine rx  Phone Note Call from Patient   Summary of Call: Patient left message on triage that she needs synthroid sent to local pharmacy while she waits on mail order. Please send to [facility]. Lucious Groves CMA  April 02, 2010 2:25 PM     New/Updated Medications: LEVOTHYROXINE SODIUM 100 MCG TABS (LEVOTHYROXINE SODIUM) 1 by mouth qd Prescriptions: LEVOTHYROXINE SODIUM 100 MCG TABS (LEVOTHYROXINE SODIUM) 1 by mouth qd  #30 x 0   Entered by:   Lucious Groves CMA   Authorized by:   Marga Melnick MD   Signed by:   Lucious Groves CMA on 04/02/2010   Method used:   Electronically to        [id number]* (retail)       [address] [place], [place]  [address]       Ph: [phone]       Fax: [phone]   RxID:   [id number]

## 2010-05-08 NOTE — Assessment & Plan Note (Signed)
Summary: Discuss sleep study,referral/drb   Vital Signs:  Patient profile:   77 year old female Weight:      139.2 pounds BMI:     24.55 Pulse rate:   60 / minute Resp:     16 per minute BP sitting:   126 / 70  (left arm) Cuff size:   large  Vitals Entered By: Shonna Chock ([place]  2, 2011 9:54 AM) CC: Discuss getting a sleep study-energy is low, Fatigue, Insomnia Comments REVIEWED MED LIST, PATIENT AGREED DOSE AND INSTRUCTION CORRECT    CC:  Discuss getting a sleep study-energy is low, Fatigue, and Insomnia.  History of Present Illness:  Fatigue      This is an 77 year old woman who presents with Fatigue, worse over 8 weeks.  The patient reports persistent fatigue, fatigue unrelated to  physical limitations (water aerobics 3X/week).This is  primarily a  motivational fatigue.  The patient denies fever, night sweats, weight loss, exertional chest pain, dyspnea, cough, hemoptysis, and new medications.  Other symptoms include leg swelling, melena, severe snoring despite a dental appliance ("Snore Guard"), and daytime sleepiness.  The patient denies the following symptoms: orthopnea, PND, adenopathy, and skin changes.  Depressive symptoms include feeling depressed  and poor sleep.  The patient denies anhedonia and altered appetite.  Her husband has dementia which is a major  stress.   The patient reports  occasional difficulty falling asleep,occa  frequent awakening, occasional  early awakening,  occasional nightmares, and occa  leg movement & Phantom Pain RLE.  Behaviors that may contribute to insomnia include daytime naps.  Past treatments that have been effective include sedatives(Lorazepam).    Allergies (verified): No Known Drug Allergies  Review of Systems General:  Denies chills. Eyes:  Denies blurring, double vision, and vision loss-both eyes. ENT:  Denies difficulty swallowing and hoarseness. CV:  Denies palpitations. Resp:  Denies morning headaches; No observed apnea. GI:   Denies constipation and diarrhea. Derm:  Complains of changes in nail beds; denies dryness. Neuro:  Complains of numbness and tingling; N&T LUE from CTS. Psych:  Denies anxiety, easily angered, easily tearful, and irritability. Endo:  Denies cold intolerance and heat intolerance.  Physical Exam  General:  well-nourished,in no acute distress; alert,appropriate and cooperative throughout examination Eyes:  No corneal or conjunctival inflammation noted. No lid lag. Perrla. Nose:  External nasal examination shows no deformity or inflammation. Nasal mucosa are pink and moist without lesions or exudates. Mouth:  Oral mucosa and oropharynx without lesions or exudates.  Teeth in good repair. Oropharynx not crowded Neck:  No deformities, masses, or tenderness noted. Lungs:  Normal respiratory effort, chest expands symmetrically. Lungs are clear to auscultation, no crackles or wheezes. Heart:  regular rhythm, no murmur, no gallop, no rub, no JVD, no HJR, and bradycardia.   Split S1 Abdomen:  Bowel sounds positive,abdomen soft and non-tender without masses, organomegaly or hernias noted. Extremities:  No clubbing, cyanosis, edema. Prosthesis RLE.  Neurologic:  alert & oriented X3 and DTR decreased LLE . Skin:  Intact without suspicious lesions or rashes Cervical Nodes:  No lymphadenopathy noted Axillary Nodes:  No palpable lymphadenopathy Psych:  memory intact for recent and remote, normally interactive, good eye contact, not anxious appearing, and not depressed appearing.     Impression & Recommendations:  Problem # 1:  FATIGUE (ICD-780.79)  Multifactorial  Orders: Sleep Disorder Referral (Sleep Disorder) Venipuncture (08657) TLB-CBC Platelet - w/Differential (85025-CBCD) TLB-TSH (Thyroid Stimulating Hormone) (84443-TSH)  Problem # 2:  SLEEP  DISORDER, CHRONIC (ICD-780.50)  Orders: Sleep Disorder Referral (Sleep Disorder)  Problem # 3:  HYPERTENSION (ICD-401.9) controlled Her updated  medication list for this problem includes:    Demadex 20 Mg Tabs (Torsemide) .Marland Kitchen... 1 by mouth qd    Avapro 300 Mg Tabs (Irbesartan) .Marland Kitchen... 1 by mouth qd    Metoprolol Tartrate 50 Mg Tabs (Metoprolol tartrate) .Marland Kitchen... 1 by mouth bid    Amlodipine Besylate 5 Mg Tabs (Amlodipine besylate) .Marland Kitchen... 1 qd  Orders: Venipuncture (16109) TLB-Creatinine, Blood (82565-CREA) TLB-Potassium (K+) (84132-K) TLB-BUN (Urea Nitrogen) (84520-BUN)  Problem # 4:  HYPERLIPIDEMIA NEC/NOS (ICD-272.4)  Her updated medication list for this problem includes:    Simvastatin 40 Mg Tabs (Simvastatin) .Marland Kitchen... 1 at bedtime  Orders: Venipuncture (60454) TLB-Lipid Panel (80061-LIPID) TLB-Hepatic/Liver Function Pnl (80076-HEPATIC)  Problem # 5:  HYPOTHYROIDISM (ICD-244.9)  Her updated medication list for this problem includes:    Synthroid 75 Mcg Tabs (Levothyroxine sodium) .Marland Kitchen... 1 by mouth qd  Orders: Venipuncture (09811) TLB-TSH (Thyroid Stimulating Hormone) (84443-TSH)  Complete Medication List: 1)  Synthroid 75 Mcg Tabs (Levothyroxine sodium) .Marland Kitchen.. 1 by mouth qd 2)  Demadex 20 Mg Tabs (Torsemide) .Marland Kitchen.. 1 by mouth qd 3)  Omeprazole 20 Mg Tbec (Omeprazole) .Marland Kitchen.. 1 by mouth once daily 4)  Singulair 10 Mg Tabs (Montelukast sodium) .Marland Kitchen.. 1 by mouth qd 5)  Avapro 300 Mg Tabs (Irbesartan) .Marland Kitchen.. 1 by mouth qd 6)  Premarin 0.3 Mg Tabs (Estrogens conjugated) .Marland Kitchen.. 1 by mouth qd 7)  Fish Oil 1000 Mg Caps (Omega-3 fatty acids) .... Bid 8)  Calcium/vit D  .... Bid 9)  Mvi/fe  10)  Fe  11)  Metoprolol Tartrate 50 Mg Tabs (Metoprolol tartrate) .Marland Kitchen.. 1 by mouth bid 12)  Amlodipine Besylate 5 Mg Tabs (Amlodipine besylate) .Marland Kitchen.. 1 qd 13)  Coq10 150 Mg Caps (coenzyme Q10)  .Marland Kitchen.. 1 by mouth once daily 14)  Zolpidem Tartrate 5 Mg Tabs (Zolpidem tartrate) .Marland Kitchen.. 1 q 3rd night prn 15)  Mobic 15 Mg Tabs (Meloxicam) .Marland Kitchen.. 1 by mouth at bedtime 16)  Bayer Low Strength 81 Mg Tbec (Aspirin) .Marland Kitchen.. 1 by mouth once daily 17)  Lorazepam 0.5 Mg Tabs  (Lorazepam) .Marland Kitchen.. 1-2 at bedtime as needed 18)  Simvastatin 40 Mg Tabs (Simvastatin) .Marland Kitchen.. 1 at bedtime 19)  Glucosamine 500 Mg Caps (Glucosamine sulfate) .Marland Kitchen.. 1 by mouth two times a day

## 2010-05-08 NOTE — Letter (Signed)
Summary: CMN for CPAP Supplies/[facility]  CMN for CPAP Supplies/[facility]   Imported By: Sherian Rein 03/07/2010 08:18:32  _____________________________________________________________________  External Attachment:    Type:   Image     Comment:   External Document

## 2010-05-08 NOTE — Assessment & Plan Note (Signed)
Summary: rov to discuss sleep study results.   Copy to:  Marga Melnick Primary Provider/Referring Provider:  Marga Melnick MD  CC:  Followup to discuss sleep study results.  No new complaints today.Marland Kitchen  History of Present Illness: the pt comes in today for f/u of her recent home sleep study.  She was surprisingly found to have an AHI of 36/hr with desat as low as 75%.  I have gone over the study in detail with her, and answered all of her questions.  Current Medications (verified): 1)  Synthroid 75 Mcg  Tabs (Levothyroxine Sodium) .Marland Kitchen.. 1 By Mouth Qd 2)  Demadex 20 Mg  Tabs (Torsemide) .Marland Kitchen.. 1 By Mouth Qd 3)  Omeprazole 20 Mg  Tbec (Omeprazole) .Marland Kitchen.. 1 By Mouth Once Daily 4)  Singulair 10 Mg  Tabs (Montelukast Sodium) .Marland Kitchen.. 1 By Mouth Qd 5)  Avapro 300 Mg  Tabs (Irbesartan) .Marland Kitchen.. 1 By Mouth Qd 6)  Premarin 0.3 Mg  Tabs (Estrogens Conjugated) .Marland Kitchen.. 1 By Mouth Qd 7)  Fish Oil 1000 Mg  Caps (Omega-3 Fatty Acids) .... Bid 8)  Calcium/vit D .... Bid 9)  Mvi/fe 10)  Fe 11)  Metoprolol Tartrate 50 Mg  Tabs (Metoprolol Tartrate) .Marland Kitchen.. 1 By Mouth Bid 12)  Amlodipine Besylate 5 Mg  Tabs (Amlodipine Besylate) .Marland Kitchen.. 1 Qd 13)  Coq10 150 Mg Caps (Coenzyme Q10) .Marland Kitchen.. 1 By Mouth Once Daily 14)  Mobic 15 Mg Tabs (Meloxicam) .Marland Kitchen.. 1 By Mouth At Bedtime 15)  Bayer Aspirin 325 Mg Tabs (Aspirin) .Marland Kitchen.. 1 Once Daily 16)  Lorazepam 0.5 Mg Tabs (Lorazepam) .Marland Kitchen.. 1-2 At Bedtime As Needed 17)  Simvastatin 40 Mg Tabs (Simvastatin) .Marland Kitchen.. 1 At Bedtime 18)  Glucosamine 500 Mg Caps (Glucosamine Sulfate) .Marland Kitchen.. 1 By Mouth Two Times A Day 19)  Prilosec 20 Mg Cpdr (Omeprazole) .Marland Kitchen.. 1 Once Daily  Allergies (verified): 1)  ! * Latex  Review of Systems       The patient complains of irregular heartbeats.  The patient denies shortness of breath with activity, shortness of breath at rest, productive cough, non-productive cough, coughing up blood, chest pain, acid heartburn, indigestion, loss of appetite, weight change, abdominal  pain, difficulty swallowing, sore throat, tooth/dental problems, headaches, nasal congestion/difficulty breathing through nose, sneezing, itching, ear ache, anxiety, depression, hand/feet swelling, joint stiffness or pain, rash, change in color of mucus, and fever.    Vital Signs:  Patient profile:   77 year old female Weight:      138 pounds O2 Sat:      97 % on Room air Temp:     97.7 degrees F oral Pulse rate:   56 / minute BP sitting:   114 / 62  (left arm)  Vitals Entered By: Vernie Murders (October 09, 2009 4:09 PM)  O2 Flow:  Room air CC: Followup to discuss sleep study results.  No new complaints today.   Physical Exam  General:  wd female in nad   Impression & Recommendations:  Problem # 1:  OBSTRUCTIVE SLEEP APNEA (ICD-327.23) Surprisingly, the pt had moderate to severe osa by her recent sleep study.  I have gone over the results with her, and also discussed the various treatment options.  Given the severity of her osa, along with her underlying cardiac issues, I think a trial of cpap should be considered first.  The pt is willing to do this.  I will set the patient up on cpap at a moderate pressure level to allow for desensitization, and  will troubleshoot the device over the next 4-6weeks if needed.  The pt is to call me if having issues with tolerance.  Will then optimize the pressure once patient is able to wear cpap on a consistent basis.  Time spent with pt today was . discussing the above.  Medications Added to Medication List This Visit: 1)  Bayer Aspirin 325 Mg Tabs (Aspirin) .Marland Kitchen.. 1 once daily 2)  Prilosec 20 Mg Cpdr (Omeprazole) .Marland Kitchen.. 1 once daily  Other Orders: Est. Patient Level IV (95284) DME Referral (DME)  Patient Instructions: 1)  will try on cpap for the next 4 weeks, and followup with me at that time.  Please call if having tolerance issues.

## 2010-05-08 NOTE — Medication Information (Signed)
Summary: Diabetes Supplies/[facility]  Diabetes Supplies/[facility]   Imported By: Lanelle Bal 09/21/2009 11:37:32  _____________________________________________________________________  External Attachment:    Type:   Image     Comment:   External Document

## 2010-05-08 NOTE — Progress Notes (Signed)
Summary: Refill Request  Phone Note Refill Request Call back at [phone] Message from:  Pharmacy on August 01, 2009 8:17 AM  Refills Requested: Medication #1:  OMEPRAZOLE 20 MG  TBEC 1 by mouth once daily   Dosage confirmed as above?Dosage Confirmed   Supply Requested: 3 months   Notes: 1 refill  Medication #2:  DEMADEX 20 MG  TABS 1 by mouth qd   Dosage confirmed as above?Dosage Confirmed   Brand Name Necessary? No   Supply Requested: 3 months   Notes: 1 refill [facility]  Next Appointment Scheduled: none Initial call taken by: Harold Barban,  August 01, 2009 8:17 AM    Prescriptions: OMEPRAZOLE 20 MG  TBEC (OMEPRAZOLE) 1 by mouth once daily  #90 x 2   Entered by:   Shonna Chock   Authorized by:   Marga Melnick MD   Signed by:   Shonna Chock on 08/01/2009   Method used:   Faxed to ...       [facility] (mail-order)             ,          Ph: [phone]       Fax: [phone]   RxID:   [id number] DEMADEX 20 MG  TABS (TORSEMIDE) 1 by mouth qd  #90 x 2   Entered by:   Shonna Chock   Authorized by:   Marga Melnick MD   Signed by:   Shonna Chock on 08/01/2009   Method used:   Faxed to ...       [facility]* (mail-order)             ,          Ph: [phone]       Fax: [phone]   RxID:   [id number]

## 2010-05-08 NOTE — Progress Notes (Signed)
Summary: cpap  Phone Note Call from Patient   Caller: Patient Call For: clance Summary of Call: pt requests to speak to pcc re: cpap and ONO. [phone] (cell) Initial call taken by: Tivis Ringer, CNA,  May 02, 2010 4:26 PM  Follow-up for Phone Call        SPOKE TO PT AND TO [email or web address]  PT EILL BE CALLED ASAP TO SET UP BIPAP Follow-up by: Oneita Jolly,  May 02, 2010 4:51 PM

## 2010-05-08 NOTE — Miscellaneous (Signed)
Summary: Order/[facility]  Order/[facility]   Imported By: Sherian Rein 04/01/2010 11:29:50  _____________________________________________________________________  External Attachment:    Type:   Image     Comment:   External Document

## 2010-05-08 NOTE — Assessment & Plan Note (Signed)
Summary: no visit.Marland Kitchenpt never received cpap device.   Copy to:  Marga Melnick Primary Malene Blaydes/Referring Jacia Sickman:  Marga Melnick MD  CC:  Pt is here for a f/u appt.  Pt states she never got sleep study because she was informed by [facility] from [facility] that Medicare would not pay for pt to get a cpap machine because she didn't have a "formal sleep study" only had an apnea link study. .  History of Present Illness: no visit.  pt never received cpap machine.  Medications Prior to Update: 1)  Synthroid 75 Mcg  Tabs (Levothyroxine Sodium) .Marland Kitchen.. 1 By Mouth Qd 2)  Demadex 20 Mg  Tabs (Torsemide) .Marland Kitchen.. 1 By Mouth Qd 3)  Omeprazole 20 Mg  Tbec (Omeprazole) .Marland Kitchen.. 1 By Mouth Once Daily 4)  Singulair 10 Mg  Tabs (Montelukast Sodium) .Marland Kitchen.. 1 By Mouth Qd 5)  Avapro 300 Mg  Tabs (Irbesartan) .Marland Kitchen.. 1 By Mouth Qd 6)  Premarin 0.3 Mg  Tabs (Estrogens Conjugated) .Marland Kitchen.. 1 By Mouth Qd 7)  Fish Oil 1000 Mg  Caps (Omega-3 Fatty Acids) .... Bid 8)  Calcium/vit D .... Bid 9)  Mvi/fe 10)  Fe 11)  Metoprolol Tartrate 50 Mg  Tabs (Metoprolol Tartrate) .Marland Kitchen.. 1 By Mouth Bid 12)  Amlodipine Besylate 10 Mg Tabs (Amlodipine Besylate) .... One By Mouth Daily 13)  Coq10 150 Mg Caps (Coenzyme Q10) .Marland Kitchen.. 1 By Mouth Once Daily 14)  Mobic 15 Mg Tabs (Meloxicam) .Marland Kitchen.. 1 By Mouth At Bedtime 15)  Bayer Aspirin 325 Mg Tabs (Aspirin) .Marland Kitchen.. 1 Once Daily 16)  Lorazepam 0.5 Mg Tabs (Lorazepam) .Marland Kitchen.. 1-2 At Bedtime As Needed 17)  Simvastatin 40 Mg Tabs (Simvastatin) .... 1/2 Tablet Po At Bedtime 18)  Glucosamine 500 Mg Caps (Glucosamine Sulfate) .Marland Kitchen.. 1 By Mouth Two Times A Day  Allergies (verified): 1)  ! * Latex  Vital Signs:  Patient profile:   77 year old female Height:      63 inches Weight:      139 pounds BMI:     24.71 O2 Sat:      96 % on Room air Temp:     97.7 degrees F oral Pulse rate:   50 / minute BP sitting:   136 / 70  (left arm) Cuff size:   regular  Vitals Entered By: Arman Filter LPN (November 13, 2009 2:35 PM)  O2  Flow:  Room air CC: Pt is here for a f/u appt.  Pt states she never got sleep study because she was informed by [facility] from [facility] that Medicare would not pay for pt to get a cpap machine because she didn't have a "formal sleep study" only had an apnea link study.  Comments Medications reviewed with patient Arman Filter LPN  November 13, 2009 2:40 PM     Other Orders: No Charge Patient Arrived (NCPA0) (NCPA0)
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
## 2010-05-08 NOTE — Progress Notes (Signed)
Summary: REFILL LORAZEPAM  Phone Note Call from Patient Call back at Home Phone [phone]   Caller: Patient Call For: Marga Melnick MD Reason for Call: Refill Medication Summary of Call: PATIENT IS CALLING TO INFORM us THAT [facility] WILL BE FAXING [place] A RX REQUEST FOR 90 DAY SUPPLY OF LORAZEPAM 0.5MG .  PATIENT STATES [facility] TOLD HER THAT OUR OFFICE NEVER FAXED IN THE ORIGINAL RX.   LAST OV WAS 03-22-2009.   Initial call taken by: Magdalen Spatz [facility],  April 18, 2009 3:49 PM  Follow-up for Phone Call        Faxed over paper request to [facility], that had Dr.Hopper's actual sig not stamped Follow-up by: Shonna Chock,  April 19, 2009 8:15 AM    Prescriptions: LORAZEPAM 0.5 MG TABS (LORAZEPAM) 1-2 at bedtime as needed  #90 x 4   Entered by:   Shonna Chock   Authorized by:   Marga Melnick MD   Signed by:   Shonna Chock on 04/19/2009   Method used:   Historical   RxID:   [id number]
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
## 2010-05-08 NOTE — Letter (Signed)
Summary: Statement of Medical Necessity / [employer]  Statement of Medical Necessity / [employer]   Imported By: Lennie Odor 04/18/2010 09:30:56  _____________________________________________________________________  External Attachment:    Type:   Image     Comment:   External Document

## 2010-05-08 NOTE — Progress Notes (Signed)
Summary: auto showing pressure of 17-20 cm  Phone Note From Other Clinic   Caller: Buford Dresser with [facility] Call For: Dr. Shelle Iron Summary of Call: [phone] Mardelle Matte with [facility] called and stated that auto is showing pt is needing 17-20 cm of pressure.  Pt used the apnea link and has not had a formal study. Since pt is requiring this amount of pressure from cpap, do you feel like pt would benefit from a cpap/bipap titration study? Please advise. Thanks, Initial call taken by: Alfonso Ramus,  January 24, 2010 1:59 PM  Follow-up for Phone Call        Pt called yesterday Monday 01/27/10 and stated that she is having a lot of air escaping from her mask when the cpap gets up to the higher pressures. She states that she just can't wear  the cpap for long before she has to take it off. She is worried about being compliant.  Pt said that doesn't have a problem with the low pressure but when it gets up around 19 is when the air starts coming out of her mask and is very loud. Please advise. Alfonso Ramus  January 28, 2010 10:07 AM   Additional Follow-up for Phone Call Additional follow up Details #1::        my guess is the reason she needs such high pressure is that her mask is leaking, and providing feedback to machine.  We can either send her to the sleep lab to have her pressure adjusted under direct observation, or put limits on the auto for a few weeks and see how she does from there.   If she wants to stick with auto at lower pressures, would get pcc to send order to limit auto to 5cm-14cm.  she may also need to have her mask fit looked at again.   Additional Follow-up by: Barbaraann Share MD,  January 28, 2010 1:21 PM    Additional Follow-up for Phone Call Additional follow up Details #2::    [facility] for pt to return my call. Rhonda Cobb  January 28, 2010 2:04 PM Spoke with pt and she has decided to try reducing pressure on cpap to 5-14 cm. Verbal order given to Buford Dresser, with [facility] to change auto  pressure from 5-20 cwp to 5-14 cwp. Advised pt to call us and let us know if this helps. She verbalized understanding and is aware to call us if she has any additional problems or concerns. She will call us and give [place] a report of how this pressure setting is working for her. Alfonso Ramus  January 28, 2010 4:47 PM
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
## 2010-05-08 NOTE — Miscellaneous (Signed)
  Clinical Lists Changes  Orders: Added new Referral order of Sleep Disorder Referral (Sleep Disorder) - Signed received communication from advanced that pt is not having a good response to cpap, and is having an elevated AHI by download.  I think a lot of this is due to mask leak.  I think she is going to need a formal titration study at sleep center with better mask fitting.  will arrange.

## 2010-05-08 NOTE — Progress Notes (Signed)
Summary: need followup appt; has appt for 12/27  ---- Converted from flag ---- ---- 03/27/2010 6:22 AM, Marga Melnick MD wrote: please verify appt; her hypothyroidism needs to be addressed ------------------------------       Additional Follow-up for Phone Call Additional follow up Details #2::    will call to make appt.Kelsey Joseph  March 27, 2010 11:48 AM  Patient has followup appt with Dr Alwyn Ren for Tuesday 12/27 at 3:10 pm Follow-up by: Kelsey Joseph,  March 27, 2010 12:15 PM

## 2010-05-08 NOTE — Progress Notes (Signed)
Summary: refill  Phone Note Refill Request Message from:  Patient on Aug 27, 2009 4:39 PM  Refills Requested: Medication #1:  SIMVASTATIN 40 MG TABS 1 at bedtime. patient wants rx faxed to [facility]   Method Requested: Fax to Mail Away Pharmacy Initial call taken by: Okey Regal [place],  Aug 27, 2009 4:39 PM    Prescriptions: SIMVASTATIN 40 MG TABS (SIMVASTATIN) 1 at bedtime  #90 x 1   Entered by:   Jeremy Johann CMA   Authorized by:   Marga Melnick MD   Signed by:   Jeremy Johann CMA on 08/28/2009   Method used:   Faxed to ...       [facility] (mail-order)             ,          Ph: [phone]       Fax: [phone]   RxID:   [id number]

## 2010-05-08 NOTE — Progress Notes (Signed)
Summary: Refill Request  Phone Note Refill Request Message from:  Pharmacy on [facility] Fax #: [phone]  Refills Requested: Medication #1:  METOPROLOL TARTRATE 50 MG  TABS 1 by mouth bid   Dosage confirmed as above?Dosage Confirmed   Supply Requested: 3 months Next Appointment Scheduled: none Initial call taken by: Harold Barban,  June 26, 2009 8:18 AM  Follow-up for Phone Call        RX was faxed to [phone] Follow-up by: Shonna Chock,  June 26, 2009 10:05 AM    Prescriptions: METOPROLOL TARTRATE 50 MG  TABS (METOPROLOL TARTRATE) 1 by mouth bid  #180 x 1   Entered by:   Shonna Chock   Authorized by:   Marga Melnick MD   Signed by:   Shonna Chock on 06/26/2009   Method used:   Print then Give to Patient   RxID:   [id number]

## 2010-05-08 NOTE — Letter (Signed)
Summary: [facility]  [facility]   Imported By: Lanelle Bal 12/25/2009 10:14:33  _____________________________________________________________________  External Attachment:    Type:   Image     Comment:   External Document

## 2010-05-08 NOTE — Progress Notes (Signed)
Summary: REFILL  Phone Note Refill Request Message from:  Fax from Pharmacy on [phone]  Refills Requested: Medication #1:  SYNTHROID 75 MCG  TABS 1 by mouth qd Initial call taken by: Barb Merino,  April 30, 2009 9:21 AM    Prescriptions: SYNTHROID 75 MCG  TABS (LEVOTHYROXINE SODIUM) 1 by mouth qd  #90 Tablet x 2   Entered by:   Shonna Chock   Authorized by:   Marga Melnick MD   Signed by:   Shonna Chock on 04/30/2009   Method used:   Electronically to        [id number] (retail)       [address] [place], [place]  [address]       Ph: [phone]       Fax: [phone]   RxID:   [id number]

## 2010-05-29 ENCOUNTER — Telehealth (INDEPENDENT_AMBULATORY_CARE_PROVIDER_SITE_OTHER): Payer: Self-pay | Admitting: *Deleted

## 2010-05-29 ENCOUNTER — Other Ambulatory Visit (INDEPENDENT_AMBULATORY_CARE_PROVIDER_SITE_OTHER): Payer: Medicare Other

## 2010-05-29 ENCOUNTER — Encounter (INDEPENDENT_AMBULATORY_CARE_PROVIDER_SITE_OTHER): Payer: Self-pay | Admitting: *Deleted

## 2010-05-29 ENCOUNTER — Other Ambulatory Visit: Payer: Self-pay | Admitting: Internal Medicine

## 2010-05-29 DIAGNOSIS — E039 Hypothyroidism, unspecified: Secondary | ICD-10-CM

## 2010-05-29 LAB — TSH: TSH: 1.94 u[IU]/mL (ref 0.35–5.50)

## 2010-06-03 NOTE — Progress Notes (Signed)
Summary: tingling  Phone Note Call from Patient Call back at Home Phone [phone] [facility]   Call back at [phone]   Caller: Patient Reason for Call: Talk to Nurse Summary of Call: You asked me if I had tingling in my leg and I said "No" but now I am noticing a considerable amount of tingling.  I had my tsh tested today. If i need medication, please don"t put me on anything that causes weight gain. Blood sugar testing between 86-165 but mostly low. Initial call taken by: Freddy Jaksch,  May 29, 2010 9:29 AM  Follow-up for Phone Call        Please advise. Lucious Groves CMA  May 29, 2010 9:41 AM   Additional Follow-up for Phone Call Additional follow up Details #1::        DM control was excellent (A1c 6.3%) ; fasting glucose goal = 90-150 ON AVERAGE. TSH is in ideal range of 1-3. In 03/2010 there was numbness in arm  in context of PMH of CTS. If symptoms perssit or progress appt needed. A1c should be monitored every 4 months (250.00); report HYPOglycemia. Additional Follow-up by: Marga Melnick MD,  May 30, 2010 4:37 AM    Additional Follow-up for Phone Call Additional follow up Details #2::    Patient aware of Dr.Hoppper's response and will call next week after checking schedule to set up appointment to discuss Tingling futher./Chrae [facility] CMA  May 30, 2010 2:45 PM

## 2010-06-22 LAB — CK TOTAL AND CKMB (NOT AT ARMC)
CK, MB: 2.8 ng/mL (ref 0.3–4.0)
CK, MB: 3 ng/mL (ref 0.3–4.0)
Relative Index: INVALID (ref 0.0–2.5)
Total CK: 92 U/L (ref 7–177)

## 2010-06-22 LAB — URINALYSIS, ROUTINE W REFLEX MICROSCOPIC
Bilirubin Urine: NEGATIVE
Ketones, ur: NEGATIVE mg/dL
Nitrite: POSITIVE — AB
Protein, ur: NEGATIVE mg/dL
Specific Gravity, Urine: 1.016 (ref 1.005–1.030)

## 2010-06-22 LAB — CBC
HCT: 35.6 % — ABNORMAL LOW (ref 36.0–46.0)
Hemoglobin: 12.3 g/dL (ref 12.0–15.0)
MCH: 33.7 pg (ref 26.0–34.0)
MCH: 34 pg (ref 26.0–34.0)
MCHC: 34.7 g/dL (ref 30.0–36.0)
MCV: 97.1 fL (ref 78.0–100.0)
MCV: 97.7 fL (ref 78.0–100.0)
Platelets: 198 10*3/uL (ref 150–400)
Platelets: 218 10*3/uL (ref 150–400)
RBC: 3.46 MIL/uL — ABNORMAL LOW (ref 3.87–5.11)
RDW: 12.4 % (ref 11.5–15.5)
RDW: 12.6 % (ref 11.5–15.5)
WBC: 6.5 10*3/uL (ref 4.0–10.5)

## 2010-06-22 LAB — DIFFERENTIAL
Basophils Relative: 0 % (ref 0–1)
Eosinophils Absolute: 0.1 10*3/uL (ref 0.0–0.7)
Eosinophils Relative: 2 % (ref 0–5)
Lymphocytes Relative: 44 % (ref 12–46)
Monocytes Absolute: 0.6 10*3/uL (ref 0.1–1.0)

## 2010-06-22 LAB — COMPREHENSIVE METABOLIC PANEL
ALT: 25 U/L (ref 0–35)
Albumin: 3.1 g/dL — ABNORMAL LOW (ref 3.5–5.2)
Albumin: 3.6 g/dL (ref 3.5–5.2)
Alkaline Phosphatase: 63 U/L (ref 39–117)
BUN: 22 mg/dL (ref 6–23)
Calcium: 9.1 mg/dL (ref 8.4–10.5)
Chloride: 104 mEq/L (ref 96–112)
Creatinine, Ser: 0.67 mg/dL (ref 0.4–1.2)
Creatinine, Ser: 0.76 mg/dL (ref 0.4–1.2)
GFR calc non Af Amer: >60 mL/min (ref >60)
Total Bilirubin: 0.2 mg/dL — ABNORMAL LOW (ref 0.3–1.2)
Total Bilirubin: 0.5 mg/dL (ref 0.3–1.2)
Total Protein: 6 g/dL (ref 6.0–8.3)

## 2010-06-22 LAB — LIPID PANEL
Cholesterol: 179 mg/dL (ref 0–200)
LDL Cholesterol: 92 mg/dL (ref 0–99)
Total CHOL/HDL Ratio: 3.3 RATIO

## 2010-06-22 LAB — CARDIAC PANEL(CRET KIN+CKTOT+MB+TROPI)
CK, MB: 2.3 ng/mL (ref 0.3–4.0)
Relative Index: INVALID (ref 0.0–2.5)
Relative Index: INVALID (ref 0.0–2.5)
Total CK: 83 U/L (ref 7–177)
Troponin I: 0.02 ng/mL (ref 0.00–0.06)
Troponin I: 0.02 ng/mL (ref 0.00–0.06)

## 2010-06-22 LAB — URINE MICROSCOPIC-ADD ON

## 2010-06-22 LAB — TSH: TSH: 4.286 u[IU]/mL (ref 0.350–4.500)

## 2010-06-22 LAB — TROPONIN I: Troponin I: 0.02 ng/mL (ref 0.00–0.06)

## 2010-06-25 ENCOUNTER — Encounter: Payer: Self-pay | Admitting: Internal Medicine

## 2010-06-25 ENCOUNTER — Ambulatory Visit (INDEPENDENT_AMBULATORY_CARE_PROVIDER_SITE_OTHER): Payer: Medicare Other | Admitting: Internal Medicine

## 2010-06-25 VITALS — BP 112/58 | HR 60 | Temp 98.6°F | Wt 140.4 lb

## 2010-06-25 DIAGNOSIS — G4730 Sleep apnea, unspecified: Secondary | ICD-10-CM

## 2010-06-25 DIAGNOSIS — R609 Edema, unspecified: Secondary | ICD-10-CM

## 2010-06-25 DIAGNOSIS — R209 Unspecified disturbances of skin sensation: Secondary | ICD-10-CM

## 2010-06-25 DIAGNOSIS — R202 Paresthesia of skin: Secondary | ICD-10-CM

## 2010-06-25 DIAGNOSIS — R7309 Other abnormal glucose: Secondary | ICD-10-CM

## 2010-06-25 NOTE — Progress Notes (Signed)
  Subjective:    Patient ID: Kelsey Joseph, female    DOB: 1928/04/03, 77 y.o.   MRN: [id number]  HPI   She has had tingling in the left lower extremity from the knee down for approximately 10 weeks. Additionally she had near syncope last week. This occurred while she was standing in kitchen w/o position change . There were no definite neurologic or cardiac triggers prior to episode. The symptoms resolved basically as she supported herself over < 1 minute. Additionally she has had edema which has been worse over the last month. This has been an intermittent problem in the past. She questions whether it might be related to her diclofenac an nonsteroidal anti-inflammatory agent.  She denies excess salt intake.   She questions whether she has diabetes based on glucose monitoring at home. She has had some hypoglycemia with glucoses in the 70s. Typically the fasting blood sugars will be 80-97. She denies polyuria polyphagia or polydipsia.  2 hours after any meal glucoses have been less than 150.         Review of Systems : she denies blurred vision, double vision, or loss of vision.    she denies any significant hair or nail or skin changes. She denies any heat or cold intolerance.  She's had no significant headaches. She denies any asymmetric weakness or paralysis. Her balance has been good. She participates in water aerobics.   She denies exertional dyspnea or paroxysmal nocturnal dyspnea. As stated she has had no chest pain , rhythm change , or  palpitations particularly in reference to the near syncopal event. She denies any headache  associated with that event.    significantly she has had some difficulty with her CPAP device; she believes it has a leak as noises will actually wake her or her husband.       Objective:   Physical Exam   On exam she is in no acute distress.  She has no scleral icterus or jaundice.   the thyroid is normal to palpation. Deep tendon reflexes are normal in the upper  extremities but decreased in the left lower extremity.  Her chest is clear to auscultation with no rales or increased work of breathing  Heart rhythm is regular with a grade 1 systolic murmur at the left base with radiation to the left carotid.  She has no neck vein distention at 10. There is no hepatojugular reflux.   pulses are intact(Note:she has a  Total RLE prosthesis)    she has no cyanosis or clubbing of the nailbeds. There is trace edema of the left lower extremity.   Assessment & Plan:   #1 tingling in the left lower extremity from the knee down    #2 edema chronic ; possible exacerbation by ingestion of nonsteroidal anti-inflammatory agents. Clinically there is no evidence of congestive heart failure or hepatic insufficiency.   #3 fasting hyperglycemia with excellent glucoses based on home diary.   Plan: #1 a B12 level will be checked along with an RPR ; a recent TSH was normal    #2 a BNP  & CMET will be collected.   #3 an A1c will be drawn to assess diabetes status.   #4 sleep apnea; possible suboptimal seal with her CPAP. Consideration should be given to an echocardiogram if this issue cannot be resolved.

## 2010-06-25 NOTE — Patient Instructions (Signed)
Please schedule a follow up with Dr. Marcelyn Bruins  to assess your CPAP equipment. If sleep apnea is not optimally controlled it could put stress on the right side of the heart and contribute to the edema or swelling problem. Further recommendations will depend on the results from today's test.

## 2010-06-26 LAB — COMPREHENSIVE METABOLIC PANEL
BUN: 46 mg/dL — ABNORMAL HIGH (ref 6–23)
CO2: 28 mEq/L (ref 19–32)
Creatinine, Ser: 0.9 mg/dL (ref 0.4–1.2)
GFR: 60.74 mL/min (ref >60.00)
Glucose, Bld: 135 mg/dL — ABNORMAL HIGH (ref 70–99)
Total Bilirubin: 0.2 mg/dL — ABNORMAL LOW (ref 0.3–1.2)

## 2010-06-26 LAB — BRAIN NATRIURETIC PEPTIDE: Pro B Natriuretic peptide (BNP): 140.4 pg/mL — ABNORMAL HIGH (ref 0.0–100.0)

## 2010-07-03 ENCOUNTER — Other Ambulatory Visit: Payer: Self-pay

## 2010-07-03 MED ORDER — METOPROLOL TARTRATE 50 MG PO TABS: 50.0000 mg | ORAL_TABLET | Freq: Two times a day (BID) | ORAL | Status: DC

## 2010-08-19 NOTE — Op Note (Signed)
NAME:  Kelsey Joseph, Kelsey Joseph                ACCOUNT NO.:  [id number]   MEDICAL RECORD NO.:  [id number]          PATIENT TYPE:  AMB   LOCATION:  SDC                           FACILITY:  [facility]   PHYSICIAN:  Juluis Mire, M.D.   DATE OF BIRTH:  16-Apr-1927   DATE OF PROCEDURE:  12/02/2007  DATE OF DISCHARGE:                               OPERATIVE REPORT   PREOPERATIVE DIAGNOSES:  Redundant labia with irritation from  prosthesis.  Abdominal wall irritation at incision site with prosthesis.   POSTOPERATIVE DIAGNOSES:  Redundant labia with irritation from  prosthesis.  Abdominal wall irritation at incision site with prosthesis.   PROCEDURE:  Revision of abdominal incision.  Bilateral labial reduction.   SURGEON:  Juluis Mire, MD   ANESTHESIA:  General.   ESTIMATED BLOOD LOSS:  Minimal.   PACKS AND DRAINS:  None.   INTRAOPERATIVE BLOOD PLACED:  None.   COMPLICATIONS:  None.   INDICATIONS:  As noted in the history and physical.   PROCEDURES:  The patient was taken to the OR and placed in supine  position.  After satisfactory level of general anesthesia was obtained,  the patient's left leg was placed in the [place] stirrups.  The abdomen,  perineum, and vagina were prepped out with Betadine.  The patient draped  in sterile field.  The right leg was surgically absent.  We first turned  to the abdominal area.  There was an irritated ulcerative area at a  midline incision.  This is the area of the prosthesis seem to irritate  it.  We did a wide excision of the irritated area.  Then, we undermined  the skin using the Bovie.  We brought together the subcu with  interrupted 3-0 Vicryl.  The skin was reapproximated with 3-0 silk.  We  had good hemostasis.   We then went to labia.  First we excised a large portion of the right  labia.  We used the Bovie to bring about hemostasis.  We reapproximated  the skin with interrupted sutures of 3-0 Vicryl.  Left labia was not as  large.  We  approximated to the same size as the right, excised the  distal labial portion.  Again, used the Bovie to bring about hemostasis.  The skin was reapproximated with interrupted suture of 3-0 Vicryl.  At  the end of the procedure, we did instill 0.25% Marcaine to the incision  sites.  We visualized, had good hemostasis, and no evidence of a  hematoma  formation.  Bladder was emptied by in and out catheterization.  The  patient was then taken out of the dorsal lithotomy position once alert  and extubated, transferred to recovery room in good condition.  Sponge,  instrument, and needle counts were reported as correct by circulating  nurse.      Juluis Mire, M.D.  Electronically Signed     JSM/MEDQ  D:  12/02/2007  T:  12/02/2007  Job:  [id number]

## 2010-08-19 NOTE — H&P (Signed)
NAME:  Kelsey Joseph, Kelsey Joseph NO.:  [id number]   MEDICAL RECORD NO.:  [id number]          PATIENT TYPE:  AMB   LOCATION:  SDC                           FACILITY:  [facility]   PHYSICIAN:  Juluis Mire, M.D.   DATE OF BIRTH:  November 12, 1927   DATE OF ADMISSION:  12/02/2007  DATE OF DISCHARGE:                              HISTORY & PHYSICAL   The patient is a 77 year old postmenopausal patient, presents for labial  reduction and abdominal incision revision.   In relation to present admission, the patient has a child, had  osteomyelitis.  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  With  the prosthesis she wears, the labia are chronically being caught and  irritated.  Because of this, we are going to proceed with labial  reduction.  She also has an area of the abdomen becomes irritated.  This  was an area where she had a previous abdominal hysterectomy.  We are  going to try to revise that slightly.  She does understand the nature of  the procedure that obviously despite this continued irritation from the  prosthesis could be undertaken.   In terms of allergies, she has no known drug allergies.   MEDICATIONS:  1. She is on Synthroid 75 mcg a day.  2. Prilosec 40 mg a day.  3. Toprol 50 mg twice a day.  4. Vytorin.  5. Premarin tablets 0.03 mg a day.  6. Singulair.  7. Demadex 50 mg a day.  8. Avapro 300 mg daily.   PAST MEDICAL HISTORY:  She does have a history of hypertension,  hypercholesterolemia, and hypothyroidism.   All medications noted and followed by Dr. Lona Kettle, MD.   PAST SURGICAL HISTORY:  She has had numerous leg procedures on the right  due to the osteomyelitis.  Subsequently had this articulation removal of  the right leg.  She has had a total abdominal hysterectomy in 1970.  She  had a total left knee x2.  She has had a previous bilateral tubal  ligation.   OBSTETRICAL  HISTORY:  She has had four vaginal deliveries.   SOCIAL HISTORY:  Reveals no tobacco or alcohol use.   REVIEW OF SYSTEMS:  Noncontributory.   PHYSICAL EXAM:  VITAL SIGNS:  The patient is afebrile, stable vital  signs.  HEENT: The patient is normocephalic.  Pupils equal and react to light  accommodation.  Extraocular movements were intact.  Sclerae and  conjunctivae are clear.  Oropharynx clear.  NECK:  Without thyromegaly.  BREASTS:  Not examined.  LUNGS:  Clear.  CARDIAC:  Regular rhythm and rate without murmurs or gallops.  ABDOMEN:  She has an abdominal incision from her previous hysterectomy  to the small area of irritation noted in the right lower quadrant.  PELVIC:  Does have redundant labia.  Vaginal mucosa is clear.  She has a  mild cystocele, cuff intact.  Bimanual exam unremarkable.  NEUROLOGIC:  Grossly in normal limits.  EXTREMITIES:  Right leg is missing.  Left leg  unremarkable.  No edema.   IMPRESSION:  1. Labial irritation from leg prosthesis.  Also abdominal wall      irritation.  2. Hypertension.  3. Hypercholesterolemia.  4. Hypothyroidism.   PLAN:  At present time, will undergo labial reduction along with  revision of abdominal incision.  The risks of surgery have been  discussed including the risk of infection, the risk of bleeding or  hematoma formation that could require further surgical management.  There is really no organs near by the injury.  There is a risk of deep  venous thrombosis with associated pulmonary embolus.  She does  understand despite our revisions that continued irritation and pain  could continue.  There will be a time of healing where she cannot use  the prosthesis.  The patient expressed understanding the  indications  and risks.      Juluis Mire, M.D.  Electronically Signed     JSM/MEDQ  D:  12/02/2007  T:  12/02/2007  Job:  [id number]

## 2010-08-22 NOTE — Discharge Summary (Signed)
NAME:  Kelsey Joseph, Kelsey Joseph                          ACCOUNT NO.:  [id number]   MEDICAL RECORD NO.:  [id number]                   PATIENT TYPE:  INP   LOCATION:  5025                                 FACILITY:  [facility]   PHYSICIAN:  Feliberto Gottron. Turner Daniels, M.D.                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   PROCEDURE:  Fourth time revision of left total knee arthroplasty.   HISTORY OF PRESENT ILLNESS:  The patient is a 77 year old woman with a  complex third revision of left total knee in place.  Most recent revision  done by Dr. Turner Daniels four years ago.  Essentially two repairs were placed  previously with a median titanium rod going into the residual femur and  hooking to a custom module which was then further down linked to an S-ROM  Noiles knee.  This rod was bound to the modular using a mortise taper and  also locking pin and through the locking pin, the rod fatigued and fractured  three to four weeks prior to this admission with the sudden onset of  instability and pain and another custom module was made to be cemented on  the residual stem.  This was fabricated in the [employer] over a three to  four week period and it is now time to revise the total knee and femur.   ALLERGIES:  The patient is allergic to ADHESIVE TAPE.   CURRENT MEDICATIONS:  Synthroid, Demadex, Protonix, Lipitor, calcium,  Toprol, Omega-3, garlic, chromium, aspirin, Avapro, Estrace, multivitamin  with iron, Vicodin as needed for pain.   PAST MEDICAL HISTORY:  Usual childhood diseases.  Adult history:  1. Hypertension.  2. Hypothyroidism.  3. Elevated cholesterol.   PAST MEDICAL HISTORY:  1. Multiple revisions left knee.  2. 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   SOCIAL HISTORY:  No  tobacco, positive ethanol occasionally.  No IV drug  abuse.  She lives with her husband who is able-bodied.   FAMILY HISTORY:  Mother positive for hypertension and diabetes.  Father  positive for COPD.   REVIEW OF SYSTEMS:  HEENT:  Positive for glasses and lower dentures.  CARDIOPULMONARY:  Denies any shortness of breath, chest pain, fatigue, or  recent illness.   PHYSICAL EXAMINATION:  VITAL SIGNS:  Pulse 60, respirations 16, blood  pressure 170/68.  GENERAL:  She is a 5 feet 3 inches, 134 pound female.  HEENT:  Normocephalic, atraumatic.  Ears - TMs are clear.  Eyes - pupils are  equal, round and reactive to light and accommodation.  Nose - benign.  Throat is patent.  NECK:  Full range of motion.  CHEST:  Clear to auscultation and percussion.  HEART:  Regular rate and rhythm.  ABDOMEN:  Soft, nontender.  EXTREMITIES:  Left lower extremity is in a immobilizer with skin grafts  intact.  Neurovascularly intact to light touch.  Her upper extremities are  within normal limits.  She has no right lower extremity.   LABORATORY DATA AND X-RAYS:  X-ray shows a fracture with a long stem femoral  component.  Preoperative labs including CBC, CMET, chest x-ray, EKG, PT/PTT  all within normal limits with the exception of the EKG which showed a  possible anterior infarct, age undetermined.  BUN was 30.   HOSPITAL COURSE:  On the day of admission, the patient was taken to the  [facility]  operating room where she underwent a rerevision left  lower total knee arthroplasty using custom components from [employer] to bridge  the gap between the fractured femoral stem and the new S-ROM Noiles knee  module.  The patient was placed on perioperative antibiotics for the first  48 hours.  She was placed on Coumadin  prophylaxis with a target INR of 1.5-  2 per pharmacy protocol.  She was held from any physical therapy because of  the risk to previous graft sites on her skin.  Hemovac double-arm was  placed  into the knee and a Foley catheter was placed in her bladder.  Postoperative  day #1 - she was without complaints.  She was afebrile, vital signs stable,  hemoglobin 10, INR 1.1, Hemovac output 25 mL. Dressing was clean and dry.  She was okayed to be out of bed to chair.  The patient was seen by physical  therapy to have help with ambulation on that date and felt that rehab  consult would be appropriate because of the patient's slow progress due to  her being held because of her lack of range of motion.  Postoperative day #2  - the patient was without complaints, voiding without difficulty.  T-max  100.5. Range of motion 0.8.  Hemoglobin 8.9.  Cultures taken at the time of  surgery remain negative for any type of growth.  INR 1.2.  Hemovac  discontinued without difficulty.  Postoperative day #3 - the patient was  without complaints.  T-max 101. Hemoglobin 8.7, INR 1.7.  Dressing was dry.  Skin intact showing no signs of breakdown; otherwise stable.  She continued  to work on ambulation.  Weightbearing as tolerated with immobilizer as well  as on strengthening.  Postoperative day #4 - the patient was awake and  alert.  Pain was controlled with Percocet.  Vital signs were stable. Wound  showed no significant signs of breakdown.  Hemoglobin 8.7.  She was awaiting  rehab transfer at that point.  The patient was making steady progress with  physical therapy at this point, had actually completed her occupational  therapy with goals met.  Postoperative day #5 - the patient was awake and  alert in mild pain.  Was making significant progress with physical therapy.  PT 18.9; otherwise stable.  Home health planning was in place for home  health physical therapy and OT as needed as well as home health R.N. through  [facility] for blood draws for her Coumadin prophylaxis.  Postoperative day #6 - the patient was awake and alert in mild pain. Vital signs were stable.  She was afebrile.   Wound was clean and dry.  PT 19.2.  the patient was otherwise noted to be stable and improved compared to that  prior to surgery.  She  was discharged home to the care of her family.   She is to return to see Dr. Turner Daniels in one week's time.   DISCHARGE MEDICATIONS:  Prescriptions given for Percocet and Coumadin, to be  [facility]ed [facility] for their prophylaxis protocol, and Keflex  for antibiotic coverage.   DIET:  Regular.   ACTIVITY:  Weightbearing as tolerated with knee immobilizer.  No bending  yet.  Dressing changes p.r.n.     Laural Benes. Jannet Mantis.                     Feliberto Gottron. Turner Daniels, M.D.   Merita Norton  D:  10/25/2002  T:  10/26/2002  Job:  [id number]

## 2010-08-22 NOTE — Op Note (Signed)
NAME:  Kelsey Joseph, Kelsey Joseph                ACCOUNT NO.:  [id number]   MEDICAL RECORD NO.:  [id number]          PATIENT TYPE:  AMB   LOCATION:  DSC                          FACILITY:  [facility]   PHYSICIAN:  Feliberto Gottron. Turner Daniels, M.D.   DATE OF BIRTH:  May 25, 1927   DATE OF PROCEDURE:  10/06/2005  DATE OF DISCHARGE:                                 OPERATIVE REPORT   PREOPERATIVE DIAGNOSIS:  Left elbow olecranon bursitis.   POSTOPERATIVE DIAGNOSIS:  Left elbow olecranon bursitis.   PROCEDURE:  Left olecranon bursectomy.   SURGEON:  Feliberto Gottron. Turner Daniels, M.D.   FIRST ASSISTANT:  None.   ANESTHETIC:  General LMA and a local block at the end.   ESTIMATED BLOOD LOSS:  Minimal.   FLUID REPLACEMENT:  500 mL of crystalloid.   DRAINS PLACED:  None.   TOURNIQUET TIME:  None.   INDICATIONS FOR PROCEDURE:  A 77 year old woman well-known to me for knee  replacement surgery, has had chronic left elbow bursitis now for about 6  months.  She has had a couple of drainages; and cultures have come back  negative.  We tried local anesthetic and cortisone, works for a while, and  then the bursal sac returns.  No erythema, no increased warmth, and then no  sign of infection at this point.  In any event, after 2 attempts at  aspiration and cortisone injection.  She desires elective olecranon  bursectomy.  The risks and benefits of surgery are discussed; and patient  prepared for surgical intervention; questions answered.   DESCRIPTION OF PROCEDURE:  The patient identified by armband, taken the  operating room at [facility].  Appropriate anesthetic monitors  were attached, and general LMA anesthesia induced with the patient in the  supine position.  Tourniquet applied high to the left upper extremity,  button never used.  Left upper extremity prepped and draped in the usual  sterile fashion from the fingertips to the tourniquet; and we began the  procedure by aspirating the bursa with a 10 mL syringe and  an 18-gauge  needle; and got back serosanguineous fluid about 8 mL.  It was sent off for  Gram stain and culture.  I then made a longitudinal, elliptical incision  over the bursa removing about 5 mm of skin at the center of the incision  allowing [place] to tighten up the skin somewhat after removal of the bursa.  We  also incidentally removed, what looked like a small psoriatic lesion when we  did this.   This got [place] down to the bursa which was then removed using dissection  scissors and rongeurs back to healthy-appearing tissue.  Once this had been  accomplished the bursa was irrigated out twice with hydrogen peroxide to  stop any punctate bleeding, which was noted to be minimal.  At this point,  closure of the skin was accomplished with  running 3-0 Vicryl suture and running interlocking 4-0 nylon suture with the  elbow flexed at 100 degrees.  A dressing of Xeroform 4 x 4 dressing,  sponges, Webril, and Ace wrap applied, followed  by a sling.  The patient was  then awakened and taken to the recovery room without difficulty.      Feliberto Gottron. Turner Daniels, M.D.  Electronically Signed     FJR/MEDQ  D:  10/06/2005  T:  10/06/2005  Job:  [id number]

## 2010-08-22 NOTE — Procedures (Signed)
[place]  Patient:    Kelsey Joseph, Kelsey Joseph Visit Number: [id number] MRN: [id number]          Service Type: END Location: ENDO Attending Physician:  Rich Brave Dictated by:   Florencia Reasons, M.D. Proc. Date: 02/23/01 Admit Date:  02/23/2001   CC:         Titus Dubin. Alwyn Ren, M.D. [facility]   Procedure Report  PROCEDURE PERFORMED:  Screening colonoscopy.  ENDOSCOPIST:  Florencia Reasons, M.D.  INDICATIONS FOR PROCEDURE:  The patient is a 77 year old female with a tendency toward constipation but no other worrisome symptoms for colon cancer screening, having last had sigmoidoscopic evaluation about four years ago which was normal to 28 cm except for diverticular disease.  FINDINGS:  Pancolonic scattered diverticulosis, otherwise normal.  DESCRIPTION OF PROCEDURE:  The patient provided written consent for the procedure.  Sedation was fentanyl 100 mcg and Versed 10 mg IV without arrhythmias or desaturation.  The Olympus adjustable tension pediatric video colonoscope was advanced to the cecum with the help of some external abdominal compression.  Pull-back was then performed.  The cecum was identified by clear visualization of the appendiceal orifice.  There was a 2 mm vascular malformation in the proximal ascending colon.  No other arteriovenous malformations were seen.  There was scattered diverticulosis in both the right and the left colon.  No polyps, cancer or colitis were noted.  Retroflexion was unremarkable.  No biopsies were obtained.  The patient tolerated the procedure well and there were no apparent complications.  IMPRESSION:  Normal colonoscopy except for tiny solitary vascular malformation in the proximal colon.  PLAN: Consider screening sigmoidoscopic evaluation in five years with possible repeat colonoscopy in 10 years.Dictated by:   Florencia Reasons, M.D.  Attending Physician:  Rich Brave DD:   02/23/01 TD:  02/23/01 Job: [id number]

## 2010-08-22 NOTE — Op Note (Signed)
NAME:  Kelsey Joseph, Kelsey Joseph                          ACCOUNT NO.:  [id number]   MEDICAL RECORD NO.:  [id number]                   PATIENT TYPE:  AMB   LOCATION:  DSC                                  FACILITY:  [facility]   PHYSICIAN:  Feliberto Gottron. Turner Daniels, M.D.                DATE OF BIRTH:  04/19/1927   DATE OF PROCEDURE:  04/16/2003  DATE OF DISCHARGE:                                 OPERATIVE REPORT   PREOPERATIVE DIAGNOSIS:  Left long finger trigger.   POSTOPERATIVE DIAGNOSIS:  Left long finger trigger.   OPERATION PERFORMED:  Left long finger trigger release.   SURGEON:  Feliberto Gottron. Turner Daniels, M.D.   ANESTHESIA:  Local with intravenous sedation.   ESTIMATED BLOOD LOSS:  Minimal.   FLUIDS REPLACED:  700 mL crystalloid.   DRAINS:  None.   TOURNIQUET TIME:  12 minutes.   INDICATIONS FOR PROCEDURE:  A 77 year old patient of mine with a chronic  left long finger trigger who desires trigger finger release because it is  catching, hanging and causing pain.   DESCRIPTION OF PROCEDURE:  The patient was identified by arm band, taken to  the operating room at [facility] where appropriate  anesthetic monitors were attached and tourniquet applied to the left arm.  The left upper extremity was prepped and draped in sterile fashion from the  finger tips to the tourniquet.  Limb wrapped with an Esmarch bandage.  Tourniquet inflated to 300 mmHg and we began the procedure by first  administering a local block that was actually done before the prep and drape  with 9 mL of 0.5% Marcaine and Xylocaine without epinephrine local  anesthetic.  We then made a 1.5 cm incision over the A-1 pulley of the left  long finger transverse just through the skin and when we got to the  subcutaneous tissue, used tenotomy scissors to spread and get down to the A-  1 pulley.  We could see where the finger was triggering, the pulley was  incised longitudinally and a rectangular section taken out and then the  tendon sheath incision extended proximally and distally for about 0.5 cm  each.  The patient was asked to move her finger back and forth and no  further triggering was noted.  The wound was washed out with normal saline  solution and then the skin only was closed with running 5-0 nylon suture. A  dressing of Xeroform, 2 x 2 dressing sponges and Coban tape applied.  The  tourniquet let down, the patient was then taken to recovery room without  difficulty.                                               Feliberto Gottron. Turner Daniels, M.D.    Ovid Curd  D:  04/16/2003  T:  04/16/2003  Job:  [id number]

## 2010-08-22 NOTE — Op Note (Signed)
NAME:  Kelsey Joseph, Kelsey Joseph                          ACCOUNT NO.:  [id number]   MEDICAL RECORD NO.:  [id number]                   PATIENT TYPE:  INP   LOCATION:  5025                                 FACILITY:  [facility]   PHYSICIAN:  Feliberto Gottron. Turner Daniels, M.D.                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   PROCEDURE:  Revision left total knee replacement.   SURGEON:  Feliberto Gottron. Turner Daniels, M.D.   FIRST ASSISTANT:  Gerrit Halls, P.A.-C   SECOND ASSISTANT:  Johny Shears, P.A. student.   ANESTHESIA:  General endotracheal anesthesia.   ESTIMATED BLOOD LOSS:  300 mL.   FLUIDS REPLACED:  2200 mL Crystalloid.   URINE OUTPUT:  400 mL.   TOURNIQUET TIME:  None.   INDICATIONS FOR PROCEDURE:  The patient is a 77 year old woman with a very  complex 4th revision of left total knee in place. The most recent revision  was done 4 years ago. Essentially 2/3rds of her femur was replaced  previously by a beaded titanium rod going up into the residual femur and  then hooking up to a custom module which then further downlinked to an S-ROM  Noiles knee. In any event the rod was bound to the module using a Morris  taper and also a locking pin, and through the locking pin the rod fatigue  fractured about 3 to 4 weeks ago and yet another custom module was made  which would be cemented on the residual stem which was about 8 or 9 cm in  length and 18.2 mm in diameter. This module was fabricated by the [employer] over a 3 to 4 week period and it is now time to revise her left  total knee and femur.   DESCRIPTION OF PROCEDURE:  The patient was identified  by armband and was  taken to the operating room at [facility] main hospital.  Appropriate  anesthetic monitors were attached and general endotracheal anesthesia was  induced with the patient in the supine position on a beanbag. She was then  rolled about 30 degrees into the right lateral decubitus position  and held  there with the beanbag. The left lower extremity was then prepped and draped  in the usual sterile fashion from the ankle to the hemipelvis.   Utilizing an old anterolateral incision we cut through the skin and  subcutaneous tissue following  the old incision and through the vastus  lateralis. We immediately identified  the metal fracture fatigue site as we  cut through the vastus lateralis and removed titanium stained membrane and  also exposed the knee joint itself  down to the proximal tibia  anterolaterally about 4 cm distal  to the joint. The quadriceps tendon was  intact. The residual patella was still intact, and this was not disturbed.   We worked carefully around the scar tissue and once we had fully exposed the  distal femur we were able to extract the entire femoral component, the wrist  pin and the tibial rotating bearing came out as 1 unit. We then exposed the  distal femoral metal stem that remained circumferentially over about a 9 to  10-cm area to the point where bone was growing back on it from the previous  2 reconstructions ago that were done in 1994, where the beaded stem was  hammered up into the femur.   We then used a wire brush to clean the beads so that we would  have a good  interlock with the cement which would go over the custom module. With this  exposure we were able to slip the custom module over the distal femoral stem  and put an extra small trial with a 12-mm bearing on the module and we were  able to reduce the knee and take it through a range of motion from about 0  to 90 degrees, and it was felt to be actually quite stable.   At this point the trial components were removed. The distal femoral beaded  stem was  dried with suction and sponges, as was the sleeve of the module. A  double batch of Palacos polymethyl methacrylate cement was then mixed and  injected into the module which was then slipped over the beaded stem and the  cement allowed to harden in the correct rotation. This gave [place] good firm  fixation between the residual stem and the module. An extra small femur was  then hammered onto the stem in the correct rotation and another double batch  of Palacos polymethyl methacrylate cement with 1500 mg of Zinacef was  applied medially and laterally to mimic the contours of the distal femur.   A 12-mm bearing and wrist pin were then placed on the distal femur and  locked, and then this entire construct was placed into the tibial component  with an efflux of about 60 degrees from an anterolateral side. Once the  reduction had been obtained, the knee was taken through a range of motion  from 0 to about 80 degrees without too much difficulty, and medium Hemovacs  were placed deep in the wound.   The wound was once again pulse lavaged clean and then hand lavaged clean.  The fascial layer including the vastus lateralis and the lateral knee  capsule was closed with running #1 Vicryl suture. The subcutaneous tissue  with 0 and 2-0 undyed Vicryl suture and the skin  with skin staples. A  dressing of Xeroform, 4 x 4 dressing sponges, Webril,  an Ace wrap and a  knee immobilizer were applied.   The patient was then awakened. She was taken to the recovery room without  difficulty.                                               Feliberto Gottron. Turner Daniels, M.D.    Ovid Curd  D:  10/06/2002  T:  10/06/2002  Job:  [id number]

## 2010-08-22 NOTE — Op Note (Signed)
NAME:  Kelsey Joseph, Kelsey Joseph                          ACCOUNT NO.:  [id number]   MEDICAL RECORD NO.:  [id number]                   PATIENT TYPE:  AMB   LOCATION:  DSC                                  FACILITY:  [facility]   PHYSICIAN:  Feliberto Gottron. Turner Daniels, M.D.                DATE OF BIRTH:  1927/06/19   DATE OF PROCEDURE:  06/13/2003  DATE OF DISCHARGE:                                 OPERATIVE REPORT   PREOPERATIVE DIAGNOSIS:  Right carpal tunnel syndrome.   POSTOPERATIVE DIAGNOSIS:  Right carpal tunnel syndrome.   OPERATION PERFORMED:  Right carpal tunnel release.   SURGEON:  Feliberto Gottron. Turner Daniels, M.D.   ASSISTANTLaural Benes. Jannet Mantis.   ANESTHESIA:  General LMA.   ESTIMATED BLOOD LOSS:  Minimal.   FLUIDS REPLACED:  500 mL crystalloid.   TOURNIQUET TIME:  12 minutes.   INDICATIONS FOR PROCEDURE:  The patient is a 76-year-woman with severe EMG  proven carpal tunnel syndrome on the right with symptoms, who desires  elective carpal tunnel release and has some mild thenar wasting.   DESCRIPTION OF PROCEDURE:  The patient was identified by arm band, taken to  the operating room at [facility] where appropriate  anesthetic monitors were attached and general LMA anesthesia induced with  the patient in supine position.  Tourniquet applied to the right arm and the  right upper extremity prepped and draped in the usual sterile fashion from  the finger tips to the mid forearm.  The limb was then wrapped with an  Esmarch bandage, tourniquet inflated to 250 mmHg.  We began the procedure by  making a palmar longitudinal incision starting out at the wrist flexion  crease going just to the ulnar side of the thenar crease over a distance of  about 3 to 4 cm.  Small bleeders in the skin and subcutaneous tissue  identified and cauterized with the bipolar and distally we incised the  palmar fascia with a number 15 blade entering the carpal tunnel.  Keeping  tissue tension with medial and  lateral center retractors, a Therapist, nutritional  was then passed volarly into the carpal tunnel.  We cut down on the [facility]  elevator performing the carpal tunnel release proximally.  The fascial  incision was extended up underneath the skin with the black-handled scissors  under direct vision.  Having released the carpal tunnel, we then probed it  with an L-shaped retractor as well as the gloved small finger making sure  there were no masses or adhesions. The wound was then washed out with normal  saline solution irrigation.  0.5% Marcaine plain was placed  in the subcutaneous tissue medially and laterally and then the skin only was  closed with running 5-0 nylon suture.  A dressing of Xeroform, 4 x 4  dressing sponges, Webril and Ace wrap was applied.  The patient was awakened  and  taken to the recovery room without difficulty.                                               Feliberto Gottron. Turner Daniels, M.D.    Ovid Curd  D:  06/13/2003  T:  06/14/2003  Job:  [id number]

## 2010-09-12 ENCOUNTER — Telehealth: Payer: Self-pay | Admitting: Internal Medicine

## 2010-09-12 ENCOUNTER — Telehealth: Payer: Self-pay | Admitting: Pulmonary Disease

## 2010-09-12 DIAGNOSIS — G4733 Obstructive sleep apnea (adult) (pediatric): Secondary | ICD-10-CM

## 2010-09-12 NOTE — Telephone Encounter (Signed)
Patient had labs [phone] dr hoppers comment - hypothyroidism needs addressed. Diabetes is controlled-- tsh [id number] - TSH is in ideal range of 1-3; no change needed. Hopp--- patient said  she was told to have labs this month i dont see order

## 2010-09-12 NOTE — Telephone Encounter (Signed)
Repeat Lipids  & A1c after thyroid function has been normal for > 3 months. 272.4/995.20/250.00

## 2010-09-12 NOTE — Telephone Encounter (Signed)
Patient called back Kelsey Joseph scheduled lab

## 2010-09-15 NOTE — Telephone Encounter (Signed)
This needs to go to one of the sleep mds like sood/cdy/ra to address please

## 2010-09-15 NOTE — Telephone Encounter (Signed)
Pt called and left message on my voice mail that her bipap pressure was set to high and she is requesting her pressure to be decreased. Pt is set up on bipap with [facility]. Pt was seen in January 2012 and was advised to return in 8 wks. Dr. Shelle Iron is out of the office, so this message is being sent to triage. Please call pt to advise.

## 2010-09-15 NOTE — Telephone Encounter (Signed)
Spoke with pt.  She is c/o pressure to high on BIPAP causing her mask to leak and this wakes her up.  She wants the pressure decreased. She states that she has not been able to use machine in 3 wks. Will forward to doc of the day b/c KC out of the office this wk. Please advise thanks!

## 2010-09-15 NOTE — Telephone Encounter (Signed)
Spoke to husband, pt not there will have her call back

## 2010-09-16 ENCOUNTER — Other Ambulatory Visit: Payer: Self-pay | Admitting: Internal Medicine

## 2010-09-16 DIAGNOSIS — E119 Type 2 diabetes mellitus without complications: Secondary | ICD-10-CM

## 2010-09-16 DIAGNOSIS — E039 Hypothyroidism, unspecified: Secondary | ICD-10-CM

## 2010-09-16 DIAGNOSIS — T887XXA Unspecified adverse effect of drug or medicament, initial encounter: Secondary | ICD-10-CM

## 2010-09-16 DIAGNOSIS — E785 Hyperlipidemia, unspecified: Secondary | ICD-10-CM

## 2010-09-16 NOTE — Telephone Encounter (Signed)
Will forward to CDY in KC's absence. Pls advise thanks!

## 2010-09-16 NOTE — Telephone Encounter (Signed)
She is now on BIPAP 21/17 as of ov 04/17/10. This seems high for an 77 yo.   I will have [facility] reduce to 18/15 for trial pending reassessment by Dr Shelle Iron: order through [facility]

## 2010-09-16 NOTE — Telephone Encounter (Signed)
Called spoke with pt's husband, advised of CDY's recs.  Pt's husband verbalized his understanding.  Order placed > husband is aware will be taken care of tomorrow.  Will route to [facility] as FYI.

## 2010-09-17 ENCOUNTER — Other Ambulatory Visit (INDEPENDENT_AMBULATORY_CARE_PROVIDER_SITE_OTHER): Payer: Medicare Other

## 2010-09-17 DIAGNOSIS — T887XXA Unspecified adverse effect of drug or medicament, initial encounter: Secondary | ICD-10-CM

## 2010-09-17 DIAGNOSIS — E039 Hypothyroidism, unspecified: Secondary | ICD-10-CM

## 2010-09-17 DIAGNOSIS — E785 Hyperlipidemia, unspecified: Secondary | ICD-10-CM

## 2010-09-17 LAB — HEMOGLOBIN A1C: Hgb A1c MFr Bld: 6.2 % (ref 4.6–6.5)

## 2010-09-17 LAB — LIPID PANEL
Total CHOL/HDL Ratio: 4
Triglycerides: 308 mg/dL — ABNORMAL HIGH (ref 0.0–149.0)

## 2010-09-17 LAB — LDL CHOLESTEROL, DIRECT: Direct LDL: 118.2 mg/dL

## 2010-09-17 NOTE — Progress Notes (Signed)
Labs only

## 2010-10-02 ENCOUNTER — Telehealth: Payer: Self-pay | Admitting: Pulmonary Disease

## 2010-10-02 NOTE — Telephone Encounter (Signed)
LMTCB

## 2010-10-03 ENCOUNTER — Other Ambulatory Visit: Payer: Self-pay | Admitting: Pulmonary Disease

## 2010-10-03 DIAGNOSIS — G4733 Obstructive sleep apnea (adult) (pediatric): Secondary | ICD-10-CM

## 2010-10-03 NOTE — Telephone Encounter (Signed)
LMTCBx2. Kaveon Blatz, CMA  

## 2010-10-03 NOTE — Telephone Encounter (Signed)
Pt called on 09-16-10 and bipap setting was changed from 21/17 to 18/15. Spoke with the pt and she states  18/15 and she states this is way too strong. She states it feels like she is suffocating and cant catch her breath on this setting. She states her mask fit is fine and she is comfortable with it. She states when the air blows into her face it feels like is creates a suction and she cannot breath.  Please advise. Carron Curie, CMA

## 2010-10-03 NOTE — Telephone Encounter (Signed)
Can decrease further and see how she does, but if we get too low, wont be treating enough of her sleep apnea.  Will send an order to [facility].

## 2010-10-06 NOTE — Telephone Encounter (Signed)
Called, spoke with pt.  She is aware order sent to have pressure decreased to see how she does but if we go too low, wont be treating enough of her sleep apnea.  She verbalized understanding of this.

## 2010-10-12 ENCOUNTER — Other Ambulatory Visit: Payer: Self-pay | Admitting: Internal Medicine

## 2010-10-18 ENCOUNTER — Other Ambulatory Visit: Payer: Self-pay | Admitting: Internal Medicine

## 2010-11-18 ENCOUNTER — Encounter: Payer: Self-pay | Admitting: Internal Medicine

## 2010-11-18 ENCOUNTER — Ambulatory Visit (INDEPENDENT_AMBULATORY_CARE_PROVIDER_SITE_OTHER): Payer: Medicare Other | Admitting: Internal Medicine

## 2010-11-18 DIAGNOSIS — E785 Hyperlipidemia, unspecified: Secondary | ICD-10-CM

## 2010-11-18 DIAGNOSIS — E039 Hypothyroidism, unspecified: Secondary | ICD-10-CM

## 2010-11-18 DIAGNOSIS — E119 Type 2 diabetes mellitus without complications: Secondary | ICD-10-CM

## 2010-11-18 DIAGNOSIS — I10 Essential (primary) hypertension: Secondary | ICD-10-CM

## 2010-11-18 DIAGNOSIS — F329 Major depressive disorder, single episode, unspecified: Secondary | ICD-10-CM

## 2010-11-18 MED ORDER — CITALOPRAM HYDROBROMIDE 20 MG PO TABS: 20.0000 mg | ORAL_TABLET | Freq: Every day | ORAL | Status: DC

## 2010-11-18 MED ORDER — LORAZEPAM 0.5 MG PO TABS: 0.5000 mg | ORAL_TABLET | ORAL | Status: DC

## 2010-11-18 MED ORDER — CITALOPRAM HYDROBROMIDE 20 MG PO TABS: 20.0000 mg | ORAL_TABLET | ORAL | Status: DC

## 2010-11-18 NOTE — Patient Instructions (Signed)
Eat a low-fat diet with lots of fruits and vegetables, up to 7-9 servings per day. Avoid obesity; your goal is waist measurement < 40 inches.Consume less than 40 grams of sugar per day from foods & drinks with High Fructose Corn Sugar as #1,2,3 or # 4 on label. Follow the low carb nutrition program in [employer] as closely as possible to prevent Diabetes progression & complications. White carbohydrates (potatoes, rice, bread, and pasta) have a high spike of sugar and a high load of sugar. For example a  baked potato has a cup of sugar and a  french fry  2 teaspoons of sugar. Yams, wild  rice, whole grained bread &  wheat pasta have been much lower spike and load of  sugar. Portions should be the size of a deck of cards or your palm. Please  schedule fasting Labs : Lipids, TSH in 3-4 months (272.4, 244.9)

## 2010-11-18 NOTE — Progress Notes (Signed)
Subjective:    Patient ID: Kelsey Joseph, female    DOB: 1927/09/20, 77 y.o.   MRN: [id number]  HPI #1 Dyslipidemia assessment: Prior Advanced Lipid Testing: NMR Lipoprofile 2010: LDL goal = < 125.   Family history of premature CAD/ MI: no .  Nutrition: low carb .  Exercise: water aerobics 3X/week . Diabetes : A1c  6.2 . HTN: BP 120- 140/< 60. Smoking history  : quit 1961 .   Weight :  stable. Lab results reviewed :LDL 118. She is on simvastatin 40 mg one half pill at bedtime. Her cardiologist to decrease the dose previously. She had been evaluated for tachycardia arrhythmias which have not recurred.   #2 Thyroid function monitor  Medications status(change in dose/brand/mode of administration):no Constitutional: Weight change: no;  Sleep pattern:good but difficulty with CPAP mask; Appetite:good  Visual change(blurred/diplopia/visual loss):no Hoarseness:no; Swallowing issues:no Neuro: Numbness/tingling:no; Tremor:no Psych: Anxiety:no; Depression:due to husband's health Endo: Temperature intolerance: Heat:no; Cold:no  #3 Diabetes status assessment: Fasting or morning glucose range:  82-91 Highest glucose 2 hours after any meal:  < 150. Hypoglycemia :  no .                                                     Excess thirst ;  Excess hunger;  Excess urination:  no.                                                                                                                                                                   Non healing skin  ulcers or sores,especially over the foot:  no. Eye exam : 5/12, no issues. Foot care : no.     Review of Systems ROS: fatigue: intermittent ; chest pain : no ;claudication: no; palpitations: no; abd pain/bowel changes: no ; myalgias:no;  syncope : no ; memory loss: no;skin changes: no.      Objective:   Physical Exam  Gen.: well-nourished; in no acute distress Neck : full ROM ; thyroid normal Eyes: Extraocular motion intact; no lid lag or  proptosis Heart: Slow & slightly irregular  without significant  gallop, or extra heart sounds. Grade 1/6 systolic murmur  Lungs: Chest clear to auscultation without rales,rales, wheezes Neuro:Deep tendon reflexes in UE  are equal and within normal limits; no tremor  Skin: Warm and dry without significant lesions or rashes; no onycholysis RLE : no edema ; slightly decreased R DPP & PTP Psych: Normally communicative and interactive; no abnormal mood or affect clinically.         Assessment & Plan:  #1 diabetes, excellent control  #2  dyslipidemia; triglycerides have risen from 188 to 308  #3 hypothyroidism, TSH therapeutic  #4 depression  Plan: See orders and recommendations.

## 2010-11-27 ENCOUNTER — Other Ambulatory Visit: Payer: Self-pay | Admitting: Internal Medicine

## 2010-12-10 ENCOUNTER — Other Ambulatory Visit: Payer: Self-pay | Admitting: Internal Medicine

## 2010-12-10 NOTE — Telephone Encounter (Addendum)
Spoke with patient, patient states Dr.Hopper originally prescribed pravachol. Patient said when she was in the hospital she was assigned to Dr.Allred and he changed pravachol. Patient states she doesn't think she needs to see cardiologist regularly Dr.Hopper please advise    Patient also mentioned that she questions if she has a UTI, pressure in bladder area x 2 days. Patient with prosthetic leg and not sure if the pressure is related to that but would like to make sure it is not a bladder infection. Patient aware Dr.Hopper with no openings tomorrow and agreed to seeing Dr.Tabori.

## 2010-12-10 NOTE — Telephone Encounter (Signed)
Please  schedule fasting Labs after 10 weeks of Simvastatin 20 mg qhs : CK,Lipids, hepatic panel(272.4, 995.20)

## 2010-12-11 ENCOUNTER — Encounter: Payer: Self-pay | Admitting: Family Medicine

## 2010-12-11 ENCOUNTER — Ambulatory Visit (INDEPENDENT_AMBULATORY_CARE_PROVIDER_SITE_OTHER): Payer: Medicare Other | Admitting: Family Medicine

## 2010-12-11 VITALS — BP 120/54 | Temp 98.6°F | Wt 149.0 lb

## 2010-12-11 DIAGNOSIS — N390 Urinary tract infection, site not specified: Secondary | ICD-10-CM

## 2010-12-11 LAB — POCT URINALYSIS DIPSTICK
Bilirubin, UA: NEGATIVE
Blood, UA: NEGATIVE
Glucose, UA: NEGATIVE
Spec Grav, UA: 1.01
Urobilinogen, UA: NEGATIVE

## 2010-12-11 MED ORDER — CEPHALEXIN 500 MG PO CAPS: 500.0000 mg | ORAL_CAPSULE | Freq: Two times a day (BID) | ORAL | Status: AC

## 2010-12-11 MED ORDER — SIMVASTATIN 20 MG PO TABS: 20.0000 mg | ORAL_TABLET | Freq: Every day | ORAL | Status: DC

## 2010-12-11 NOTE — Assessment & Plan Note (Signed)
Despite normal UA, pt's sxs and urinary odor are consistent w/ infxn.  Start keflex rather than let pt wait the weekend waiting on cx results.  Reviewed supportive care and red flags that should prompt return.  Pt expressed understanding and is in agreement w/ plan.

## 2010-12-11 NOTE — Progress Notes (Signed)
Addended by: Beverely Low on: 12/11/2010 11:26 AM   Modules accepted: Orders

## 2010-12-11 NOTE — Progress Notes (Signed)
  Subjective:    Patient ID: Kelsey Joseph, female    DOB: 03/28/1928, 77 y.o.   MRN: [id number]  HPI ? UTI- having constant pelvic burning sensation.  Denies dysuria.  sxs started 3-4 days ago.  Reports that leg prosthesis is strapped around pelvis and this is sometimes irritating.  Some increased frequency.  + urgency.  No fevers or chills.  Denies CVA tenderness.  Pt does report strong urinary odor recently.   Review of Systems For ROS see HPI     Objective:   Physical Exam  Vitals reviewed. Constitutional: She appears well-developed and well-nourished. No distress.  Abdominal: Soft. She exhibits no distension. There is no tenderness (no CVA). There is no rebound.          Assessment & Plan:

## 2010-12-11 NOTE — Patient Instructions (Signed)
Start the Keflex for a presumed bladder infection Drink plenty of fluids Call with any questions or concerns Hang in there!!!

## 2010-12-13 LAB — URINE CULTURE: Organism ID, Bacteria: NO GROWTH

## 2010-12-15 ENCOUNTER — Telehealth: Payer: Self-pay

## 2010-12-15 NOTE — Telephone Encounter (Signed)
Message copied by Beverely Low on Mon Dec 15, 2010 10:06 AM ------      Message from: Sheliah Hatch      Created: Sun Dec 14, 2010  8:06 PM       No evidence of infxn.  Can stop abx.

## 2010-12-15 NOTE — Telephone Encounter (Signed)
Left message on personally identified voicemail to notify pt  

## 2010-12-26 ENCOUNTER — Other Ambulatory Visit: Payer: Self-pay | Admitting: Internal Medicine

## 2010-12-26 MED ORDER — AMLODIPINE BESYLATE 10 MG PO TABS: 10.0000 mg | ORAL_TABLET | Freq: Every day | ORAL | Status: DC

## 2010-12-26 NOTE — Telephone Encounter (Signed)
Patient wants refiil for norvasc 10mg    [facility] day supply to [facility] - [facility] day supply [facility] patient going out of town

## 2010-12-26 NOTE — Telephone Encounter (Signed)
RXs sent.

## 2010-12-29 ENCOUNTER — Telehealth: Payer: Self-pay | Admitting: *Deleted

## 2010-12-29 NOTE — Telephone Encounter (Signed)
Opened new phone note in error

## 2011-01-21 ENCOUNTER — Encounter: Payer: Self-pay | Admitting: Internal Medicine

## 2011-01-21 ENCOUNTER — Ambulatory Visit (INDEPENDENT_AMBULATORY_CARE_PROVIDER_SITE_OTHER): Payer: Medicare Other | Admitting: Internal Medicine

## 2011-01-21 ENCOUNTER — Other Ambulatory Visit: Payer: Self-pay | Admitting: Internal Medicine

## 2011-01-21 VITALS — BP 132/72 | HR 56 | Wt 137.0 lb

## 2011-01-21 DIAGNOSIS — S91009A Unspecified open wound, unspecified ankle, initial encounter: Secondary | ICD-10-CM

## 2011-01-21 DIAGNOSIS — S91019A Laceration without foreign body, unspecified ankle, initial encounter: Secondary | ICD-10-CM

## 2011-01-21 DIAGNOSIS — Z23 Encounter for immunization: Secondary | ICD-10-CM

## 2011-01-21 DIAGNOSIS — S81009A Unspecified open wound, unspecified knee, initial encounter: Secondary | ICD-10-CM

## 2011-01-21 NOTE — Progress Notes (Signed)
  Subjective:    Patient ID: Kelsey Joseph, female    DOB: [birth year], 77 y.o.   MRN: [id number]  HPI Last night she noted some drainage on her stocking. She established that this was coming from the anterior ankle area. She denies fever, chills, sweats, change in color of the skin or an injury or  wound in this area.  She's been using a generic antibiotic cream on this (X2). She is unsure as to the status of her tetanus immunization.  She expresses concern as one episode of osteomyelitis began as clear serous drainage from the right knee area. This progressed to the point that amputation was necessary. There is no PMH of MRSA    Review of Systems     Objective:   Physical Exam she is healthy appearing and in no acute distress   there are minor venous varicosities over the left foot.  There appears to be a 4 mm abrasion/laceration which is dry  & closed at this time. There is no significant erythema, temperature d differential or purulence.  Pedal pulses are decreased but intact.  There is suggestion of trace edema.        Assessment & Plan:  #1 minor laceration clinically without evidence of cellulitis  #2 past history of osteomyelitis of the right lower extremity requiring amputation  Plan: See recommendations.

## 2011-01-21 NOTE — Patient Instructions (Signed)
Dip gauze in  sterile saline and applied to the wound twice a day. Cover the wound with Telfa , non stick dressing  without any antibiotic ointment. The saline can be purchased at the drugstore or you can make your own .Boil cup of salt in a gallon of water. Store mixture  in a clean container.Report Warning  signs as discussed (red streaks, pus, fever, increasing pain). 

## 2011-01-24 ENCOUNTER — Other Ambulatory Visit: Payer: Self-pay | Admitting: Internal Medicine

## 2011-01-24 LAB — WOUND CULTURE
Gram Stain: NONE SEEN
Organism ID, Bacteria: NO GROWTH

## 2011-01-27 ENCOUNTER — Other Ambulatory Visit: Payer: Self-pay

## 2011-01-27 MED ORDER — LORAZEPAM 0.5 MG PO TABS: 0.5000 mg | ORAL_TABLET | ORAL | Status: DC

## 2011-01-27 NOTE — Telephone Encounter (Signed)
Paper fax sent back to [facility]

## 2011-02-17 ENCOUNTER — Other Ambulatory Visit: Payer: Self-pay | Admitting: Internal Medicine

## 2011-02-17 DIAGNOSIS — E785 Hyperlipidemia, unspecified: Secondary | ICD-10-CM

## 2011-02-17 DIAGNOSIS — E039 Hypothyroidism, unspecified: Secondary | ICD-10-CM

## 2011-02-17 DIAGNOSIS — T887XXA Unspecified adverse effect of drug or medicament, initial encounter: Secondary | ICD-10-CM

## 2011-02-18 ENCOUNTER — Other Ambulatory Visit (INDEPENDENT_AMBULATORY_CARE_PROVIDER_SITE_OTHER): Payer: Medicare Other

## 2011-02-18 DIAGNOSIS — T887XXA Unspecified adverse effect of drug or medicament, initial encounter: Secondary | ICD-10-CM

## 2011-02-18 DIAGNOSIS — E039 Hypothyroidism, unspecified: Secondary | ICD-10-CM

## 2011-02-18 DIAGNOSIS — E785 Hyperlipidemia, unspecified: Secondary | ICD-10-CM

## 2011-02-18 LAB — CK: Total CK: 92 U/L (ref 7–177)

## 2011-02-18 LAB — LIPID PANEL
HDL: 63.7 mg/dL (ref >39.00)
Total CHOL/HDL Ratio: 3
Triglycerides: 197 mg/dL — ABNORMAL HIGH (ref 0.0–149.0)
VLDL: 39.4 mg/dL (ref 0.0–40.0)

## 2011-02-18 LAB — TSH: TSH: 0.64 u[IU]/mL (ref 0.35–5.50)

## 2011-02-18 LAB — HEPATIC FUNCTION PANEL
ALT: 24 U/L (ref 0–35)
Bilirubin, Direct: 0 mg/dL (ref 0.0–0.3)
Total Bilirubin: 0.6 mg/dL (ref 0.3–1.2)

## 2011-02-18 LAB — LDL CHOLESTEROL, DIRECT: Direct LDL: 126.2 mg/dL

## 2011-02-18 NOTE — Progress Notes (Signed)
12  

## 2011-03-20 ENCOUNTER — Telehealth: Payer: Self-pay | Admitting: Internal Medicine

## 2011-03-20 MED ORDER — SIMVASTATIN 20 MG PO TABS: 20.0000 mg | ORAL_TABLET | Freq: Every day | ORAL | Status: DC

## 2011-03-20 NOTE — Telephone Encounter (Signed)
Patient want rx for simvastatin 90 day supply - [facility] [address]

## 2011-03-20 NOTE — Telephone Encounter (Signed)
RX sent

## 2011-04-24 ENCOUNTER — Encounter: Payer: Self-pay | Admitting: Family Medicine

## 2011-04-24 ENCOUNTER — Ambulatory Visit (INDEPENDENT_AMBULATORY_CARE_PROVIDER_SITE_OTHER): Payer: Medicare Other | Admitting: Family Medicine

## 2011-04-24 VITALS — BP 112/64 | HR 61 | Temp 97.7°F | Wt 139.2 lb

## 2011-04-24 DIAGNOSIS — J329 Chronic sinusitis, unspecified: Secondary | ICD-10-CM

## 2011-04-24 MED ORDER — FLUTICASONE FUROATE 27.5 MCG/SPRAY NA SUSP: 2.0000 | Freq: Every day | NASAL | Status: DC

## 2011-04-24 MED ORDER — AMOXICILLIN-POT CLAVULANATE 875-125 MG PO TABS: 1.0000 | ORAL_TABLET | Freq: Two times a day (BID) | ORAL | Status: AC

## 2011-04-24 NOTE — Patient Instructions (Signed)

## 2011-04-24 NOTE — Progress Notes (Signed)
  Subjective:     Kelsey Joseph is a 78 y.o. female who presents for evaluation of sinus pain. Symptoms include: congestion, cough, frequent clearing of the throat, headaches, nasal congestion and sinus pressure. Onset of symptoms was 3 weeks ago. Symptoms have been gradually worsening since that time. Past history is significant for no history of pneumonia or bronchitis. Patient is a former smoker.  The following portions of the patient's history were reviewed and updated as appropriate: allergies, current medications, past family history, past medical history, past social history, past surgical history and problem list.  Review of Systems Pertinent items are noted in HPI.   Objective:    BP 112/64  Pulse 61  Temp(Src) 97.7 F (36.5 C) (Oral)  Wt 139 lb 3.2 oz (63.141 kg)  SpO2 95% General appearance: alert, cooperative, appears stated age and no distress Ears: + fluid b/l Nose: green discharge, moderate congestion, sinus tenderness bilateral Throat: lips, mucosa, and tongue normal; teeth and gums normal Neck: moderate anterior cervical adenopathy, supple, symmetrical, trachea midline and thyroid not enlarged, symmetric, no tenderness/mass/nodules Lungs: clear to auscultation bilaterally Extremities: CCE   Assessment:    Acute bacterial sinusitis.    Plan:    Nasal steroids per medication orders. Antihistamines per medication orders. Augmentin per medication orders. f/u prn

## 2011-06-22 ENCOUNTER — Other Ambulatory Visit: Payer: Self-pay | Admitting: Dermatology

## 2011-06-22 DIAGNOSIS — L570 Actinic keratosis: Secondary | ICD-10-CM | POA: Diagnosis not present

## 2011-06-22 DIAGNOSIS — C44529 Squamous cell carcinoma of skin of other part of trunk: Secondary | ICD-10-CM | POA: Diagnosis not present

## 2011-06-22 DIAGNOSIS — D045 Carcinoma in situ of skin of trunk: Secondary | ICD-10-CM | POA: Diagnosis not present

## 2011-07-08 ENCOUNTER — Other Ambulatory Visit: Payer: Self-pay | Admitting: Internal Medicine

## 2011-08-26 DIAGNOSIS — H40019 Open angle with borderline findings, low risk, unspecified eye: Secondary | ICD-10-CM | POA: Diagnosis not present

## 2011-08-26 DIAGNOSIS — H04129 Dry eye syndrome of unspecified lacrimal gland: Secondary | ICD-10-CM | POA: Diagnosis not present

## 2011-08-26 DIAGNOSIS — Z961 Presence of intraocular lens: Secondary | ICD-10-CM | POA: Diagnosis not present

## 2011-08-30 ENCOUNTER — Other Ambulatory Visit: Payer: Self-pay | Admitting: Internal Medicine

## 2011-09-10 ENCOUNTER — Other Ambulatory Visit: Payer: Self-pay | Admitting: Internal Medicine

## 2011-09-15 DIAGNOSIS — M19049 Primary osteoarthritis, unspecified hand: Secondary | ICD-10-CM | POA: Diagnosis not present

## 2011-09-15 DIAGNOSIS — M6530 Trigger finger, unspecified finger: Secondary | ICD-10-CM | POA: Diagnosis not present

## 2011-10-13 ENCOUNTER — Ambulatory Visit (INDEPENDENT_AMBULATORY_CARE_PROVIDER_SITE_OTHER): Payer: Medicare Other | Admitting: Emergency Medicine

## 2011-10-13 VITALS — BP 138/60 | HR 56 | Temp 97.8°F | Resp 16 | Ht 63.5 in | Wt 134.0 lb

## 2011-10-13 DIAGNOSIS — IMO0002 Reserved for concepts with insufficient information to code with codable children: Secondary | ICD-10-CM | POA: Diagnosis not present

## 2011-10-13 DIAGNOSIS — L03119 Cellulitis of unspecified part of limb: Secondary | ICD-10-CM

## 2011-10-13 MED ORDER — SULFAMETHOXAZOLE-TRIMETHOPRIM 800-160 MG PO TABS: 1.0000 | ORAL_TABLET | Freq: Two times a day (BID) | ORAL | Status: AC

## 2011-10-13 MED ORDER — SULFAMETHOXAZOLE-TRIMETHOPRIM 800-160 MG PO TABS: 1.0000 | ORAL_TABLET | Freq: Two times a day (BID) | ORAL | Status: DC
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
## 2011-10-13 NOTE — Patient Instructions (Signed)

## 2011-10-14 ENCOUNTER — Encounter: Payer: Self-pay | Admitting: Internal Medicine

## 2011-10-16 ENCOUNTER — Other Ambulatory Visit: Payer: Self-pay

## 2011-10-16 MED ORDER — LORAZEPAM 0.5 MG PO TABS: 0.5000 mg | ORAL_TABLET | ORAL | Status: DC

## 2011-10-16 NOTE — Telephone Encounter (Signed)
RX sent

## 2011-10-18 ENCOUNTER — Telehealth: Payer: Self-pay

## 2011-10-18 MED ORDER — CEFDINIR 300 MG PO CAPS: 300.0000 mg | ORAL_CAPSULE | Freq: Two times a day (BID) | ORAL | Status: AC

## 2011-10-18 NOTE — Telephone Encounter (Signed)
Patient states she saw Dr. Dareen Piano Tuesday for a tear on her arm. Antibiotic seems to be making her sick, so she would like to know if she can stop taking it. Please advise. [phone]

## 2011-10-18 NOTE — Telephone Encounter (Signed)
Please call patient and verify that she is taking the antibiotic (septra DS) with food.  If so, and it's still causing nausea/vomiting, change to cefdinir (Omnicef) 300 mg 2 PO QD x 10 days, #20 no refills

## 2011-10-18 NOTE — Telephone Encounter (Signed)
Pt states she does take the med with food. Sent in RX to [facility] for [employer].

## 2011-10-21 ENCOUNTER — Other Ambulatory Visit: Payer: Self-pay | Admitting: Internal Medicine

## 2011-10-22 ENCOUNTER — Other Ambulatory Visit: Payer: Self-pay | Admitting: Dermatology

## 2011-10-22 DIAGNOSIS — L820 Inflamed seborrheic keratosis: Secondary | ICD-10-CM | POA: Diagnosis not present

## 2011-10-27 DIAGNOSIS — K5730 Diverticulosis of large intestine without perforation or abscess without bleeding: Secondary | ICD-10-CM | POA: Diagnosis not present

## 2011-10-27 DIAGNOSIS — Z1211 Encounter for screening for malignant neoplasm of colon: Secondary | ICD-10-CM | POA: Diagnosis not present

## 2011-11-03 ENCOUNTER — Other Ambulatory Visit: Payer: Self-pay | Admitting: Internal Medicine

## 2011-11-20 DIAGNOSIS — M67919 Unspecified disorder of synovium and tendon, unspecified shoulder: Secondary | ICD-10-CM | POA: Diagnosis not present

## 2011-11-20 DIAGNOSIS — M19019 Primary osteoarthritis, unspecified shoulder: Secondary | ICD-10-CM | POA: Diagnosis not present

## 2011-11-20 DIAGNOSIS — M19049 Primary osteoarthritis, unspecified hand: Secondary | ICD-10-CM | POA: Diagnosis not present

## 2011-11-20 DIAGNOSIS — M719 Bursopathy, unspecified: Secondary | ICD-10-CM | POA: Diagnosis not present

## 2011-11-25 ENCOUNTER — Telehealth: Payer: Self-pay

## 2011-11-25 NOTE — Telephone Encounter (Signed)
Medicare will not cover any labs without diagnostic code. If she has a secondary policy she should call and see if they will allow labs to be drawn prior to the office visit. That is certainly what we would prefer but insurance makes these rules.Marland Kitchen

## 2011-11-25 NOTE — Telephone Encounter (Signed)
Patient called and stated Kelsey Joseph was sending [place] a fax to get her diabetic supplies, I made the patient aware we no longer accept those faxes unless the patient has either been seen recently or if she comes in to discuss with Dr.Hopper, she voiced understanding and said her labs are due in Nov, I made her aware we can schedule, we scheduled CPE for 02/26/12, she wanted to know if she could have her labs done before the apt so she could discuss during CPE. I advised I would send msg to [facility] and we can call back with an answer. She agreed. Please advise     KP

## 2011-11-26 NOTE — Telephone Encounter (Signed)
Patient informed and verbalized understanding

## 2011-12-15 DIAGNOSIS — M19049 Primary osteoarthritis, unspecified hand: Secondary | ICD-10-CM | POA: Diagnosis not present

## 2011-12-15 DIAGNOSIS — M75120 Complete rotator cuff tear or rupture of unspecified shoulder, not specified as traumatic: Secondary | ICD-10-CM | POA: Diagnosis not present

## 2012-01-06 DIAGNOSIS — M75120 Complete rotator cuff tear or rupture of unspecified shoulder, not specified as traumatic: Secondary | ICD-10-CM | POA: Diagnosis not present

## 2012-01-13 DIAGNOSIS — M75120 Complete rotator cuff tear or rupture of unspecified shoulder, not specified as traumatic: Secondary | ICD-10-CM | POA: Diagnosis not present

## 2012-01-13 DIAGNOSIS — M25519 Pain in unspecified shoulder: Secondary | ICD-10-CM | POA: Diagnosis not present

## 2012-01-14 DIAGNOSIS — Z23 Encounter for immunization: Secondary | ICD-10-CM | POA: Diagnosis not present

## 2012-01-18 DIAGNOSIS — M25519 Pain in unspecified shoulder: Secondary | ICD-10-CM | POA: Diagnosis not present

## 2012-01-18 DIAGNOSIS — M75120 Complete rotator cuff tear or rupture of unspecified shoulder, not specified as traumatic: Secondary | ICD-10-CM | POA: Diagnosis not present

## 2012-01-21 DIAGNOSIS — M25519 Pain in unspecified shoulder: Secondary | ICD-10-CM | POA: Diagnosis not present

## 2012-01-26 DIAGNOSIS — M75120 Complete rotator cuff tear or rupture of unspecified shoulder, not specified as traumatic: Secondary | ICD-10-CM | POA: Diagnosis not present

## 2012-01-26 DIAGNOSIS — M25519 Pain in unspecified shoulder: Secondary | ICD-10-CM | POA: Diagnosis not present

## 2012-01-27 ENCOUNTER — Other Ambulatory Visit: Payer: Self-pay | Admitting: Internal Medicine

## 2012-01-28 DIAGNOSIS — M75120 Complete rotator cuff tear or rupture of unspecified shoulder, not specified as traumatic: Secondary | ICD-10-CM | POA: Diagnosis not present

## 2012-01-28 DIAGNOSIS — M25519 Pain in unspecified shoulder: Secondary | ICD-10-CM | POA: Diagnosis not present

## 2012-02-02 DIAGNOSIS — M25519 Pain in unspecified shoulder: Secondary | ICD-10-CM | POA: Diagnosis not present

## 2012-02-02 DIAGNOSIS — M75120 Complete rotator cuff tear or rupture of unspecified shoulder, not specified as traumatic: Secondary | ICD-10-CM | POA: Diagnosis not present

## 2012-02-04 DIAGNOSIS — M75120 Complete rotator cuff tear or rupture of unspecified shoulder, not specified as traumatic: Secondary | ICD-10-CM | POA: Diagnosis not present

## 2012-02-04 DIAGNOSIS — M25519 Pain in unspecified shoulder: Secondary | ICD-10-CM | POA: Diagnosis not present

## 2012-02-05 ENCOUNTER — Other Ambulatory Visit: Payer: Self-pay | Admitting: Internal Medicine

## 2012-02-09 ENCOUNTER — Telehealth: Payer: Self-pay | Admitting: Internal Medicine

## 2012-02-09 NOTE — Telephone Encounter (Signed)
Message copied by Marshell Garfinkel on Tue Feb 09, 2012 10:55 AM ------      Message from: Pecola Lawless      Created: Tue Jan 19, 2012  2:21 PM       Get labs 1 week pre CPX please      ----- Message -----         From: Marshell Garfinkel         Sent: 01/19/2012   2:06 PM           To: Pecola Lawless, MD            Pt is scheduled for CPE on 02/26/12. Does she still need to come in for this?      ----- Message -----         From: Pecola Lawless, MD         Sent: 01/17/2012  10:33 AM           To: [place] P Campbell            Please  schedule fasting Labs & F/U appt : CK,BMET,Lipids, hepatic panel, CBC & dif, TSH.  Codes: 250.00,272.4,995.20

## 2012-02-09 NOTE — Telephone Encounter (Signed)
called coming in for labs 11.15.13 at 8:30

## 2012-02-09 NOTE — Telephone Encounter (Signed)
Lmovm for pt to call office. °

## 2012-02-11 DIAGNOSIS — M25519 Pain in unspecified shoulder: Secondary | ICD-10-CM | POA: Diagnosis not present

## 2012-02-11 DIAGNOSIS — M75120 Complete rotator cuff tear or rupture of unspecified shoulder, not specified as traumatic: Secondary | ICD-10-CM | POA: Diagnosis not present

## 2012-02-16 DIAGNOSIS — M25519 Pain in unspecified shoulder: Secondary | ICD-10-CM | POA: Diagnosis not present

## 2012-02-16 DIAGNOSIS — M75120 Complete rotator cuff tear or rupture of unspecified shoulder, not specified as traumatic: Secondary | ICD-10-CM | POA: Diagnosis not present

## 2012-02-17 DIAGNOSIS — M67919 Unspecified disorder of synovium and tendon, unspecified shoulder: Secondary | ICD-10-CM | POA: Diagnosis not present

## 2012-02-17 DIAGNOSIS — M75120 Complete rotator cuff tear or rupture of unspecified shoulder, not specified as traumatic: Secondary | ICD-10-CM | POA: Diagnosis not present

## 2012-02-18 DIAGNOSIS — M67919 Unspecified disorder of synovium and tendon, unspecified shoulder: Secondary | ICD-10-CM | POA: Diagnosis not present

## 2012-02-18 DIAGNOSIS — M719 Bursopathy, unspecified: Secondary | ICD-10-CM | POA: Diagnosis not present

## 2012-02-18 DIAGNOSIS — M75120 Complete rotator cuff tear or rupture of unspecified shoulder, not specified as traumatic: Secondary | ICD-10-CM | POA: Diagnosis not present

## 2012-02-19 ENCOUNTER — Other Ambulatory Visit (INDEPENDENT_AMBULATORY_CARE_PROVIDER_SITE_OTHER): Payer: Medicare Other

## 2012-02-19 DIAGNOSIS — E785 Hyperlipidemia, unspecified: Secondary | ICD-10-CM

## 2012-02-19 DIAGNOSIS — T887XXA Unspecified adverse effect of drug or medicament, initial encounter: Secondary | ICD-10-CM

## 2012-02-19 DIAGNOSIS — E119 Type 2 diabetes mellitus without complications: Secondary | ICD-10-CM | POA: Diagnosis not present

## 2012-02-19 LAB — HEPATIC FUNCTION PANEL
ALT: 29 U/L (ref 0–35)
AST: 30 U/L (ref 0–37)
Alkaline Phosphatase: 55 U/L (ref 39–117)
Bilirubin, Direct: 0.1 mg/dL (ref 0.0–0.3)
Total Bilirubin: 0.8 mg/dL (ref 0.3–1.2)

## 2012-02-19 LAB — CBC WITH DIFFERENTIAL/PLATELET
Basophils Relative: 0.1 % (ref 0.0–3.0)
Eosinophils Absolute: 0 10*3/uL (ref 0.0–0.7)
MCHC: 33.3 g/dL (ref 30.0–36.0)
MCV: 95.9 fl (ref 78.0–100.0)
Monocytes Absolute: 0.7 10*3/uL (ref 0.1–1.0)
Neutrophils Relative %: 61.5 % (ref 43.0–77.0)
Platelets: 253 10*3/uL (ref 150.0–400.0)
RBC: 4.1 Mil/uL (ref 3.87–5.11)

## 2012-02-19 LAB — BASIC METABOLIC PANEL
BUN: 40 mg/dL — ABNORMAL HIGH (ref 6–23)
CO2: 26 mEq/L (ref 19–32)
Chloride: 102 mEq/L (ref 96–112)
Creatinine, Ser: 0.9 mg/dL (ref 0.4–1.2)
Glucose, Bld: 100 mg/dL — ABNORMAL HIGH (ref 70–99)
Potassium: 3.8 mEq/L (ref 3.5–5.1)

## 2012-02-19 LAB — LDL CHOLESTEROL, DIRECT: Direct LDL: 123.2 mg/dL

## 2012-02-23 DIAGNOSIS — M75120 Complete rotator cuff tear or rupture of unspecified shoulder, not specified as traumatic: Secondary | ICD-10-CM | POA: Diagnosis not present

## 2012-02-23 DIAGNOSIS — M67919 Unspecified disorder of synovium and tendon, unspecified shoulder: Secondary | ICD-10-CM | POA: Diagnosis not present

## 2012-02-23 DIAGNOSIS — M719 Bursopathy, unspecified: Secondary | ICD-10-CM | POA: Diagnosis not present

## 2012-02-25 DIAGNOSIS — M75120 Complete rotator cuff tear or rupture of unspecified shoulder, not specified as traumatic: Secondary | ICD-10-CM | POA: Diagnosis not present

## 2012-02-25 DIAGNOSIS — M719 Bursopathy, unspecified: Secondary | ICD-10-CM | POA: Diagnosis not present

## 2012-02-26 ENCOUNTER — Encounter: Payer: Self-pay | Admitting: Internal Medicine

## 2012-02-26 ENCOUNTER — Ambulatory Visit (INDEPENDENT_AMBULATORY_CARE_PROVIDER_SITE_OTHER): Payer: Medicare Other | Admitting: Internal Medicine

## 2012-02-26 VITALS — BP 150/66 | HR 64 | Temp 97.4°F | Resp 14 | Ht 62.08 in | Wt 131.0 lb

## 2012-02-26 DIAGNOSIS — E785 Hyperlipidemia, unspecified: Secondary | ICD-10-CM | POA: Diagnosis not present

## 2012-02-26 DIAGNOSIS — I10 Essential (primary) hypertension: Secondary | ICD-10-CM | POA: Diagnosis not present

## 2012-02-26 DIAGNOSIS — Z0000 Encounter for general adult medical examination without abnormal findings: Secondary | ICD-10-CM | POA: Diagnosis not present

## 2012-02-26 DIAGNOSIS — I440 Atrioventricular block, first degree: Secondary | ICD-10-CM | POA: Insufficient documentation

## 2012-02-26 DIAGNOSIS — E039 Hypothyroidism, unspecified: Secondary | ICD-10-CM

## 2012-02-26 DIAGNOSIS — R7309 Other abnormal glucose: Secondary | ICD-10-CM

## 2012-02-26 DIAGNOSIS — G4733 Obstructive sleep apnea (adult) (pediatric): Secondary | ICD-10-CM

## 2012-02-26 MED ORDER — CITALOPRAM HYDROBROMIDE 20 MG PO TABS: ORAL_TABLET | ORAL | Status: DC

## 2012-02-26 MED ORDER — LEVOTHYROXINE SODIUM 100 MCG PO TABS: 100.0000 ug | ORAL_TABLET | Freq: Every day | ORAL | Status: DC

## 2012-02-26 NOTE — Patient Instructions (Addendum)
Blood Pressure Goal = < 140/90 , Ideal is an AVERAGE < 135/85. This AVERAGE should be calculated from @ least 5-7 BP readings taken @ different times of day on different days of week. You should not respond to isolated BP readings , but rather the AVERAGE for that week Please bring your  blood pressure cuff to office visits to verify that it is reliable.It  can also be checked against the blood pressure device at the pharmacy. Finger or wrist cuffs are not dependable; an arm cuff is.  Please followup with Dr. Shelle Iron concerning your sleep apnea and inability to tolerate CPAP

## 2012-02-26 NOTE — Progress Notes (Signed)
Subjective:    Patient ID: Kelsey Joseph, female    DOB: Sep 01, 1927, 78 y.o.   MRN: [id number]  HPI Medicare Wellness Visit:  The following psychosocial & medical history were reviewed as required by Medicare.   Social history: caffeine: 3 cups/ day , alcohol:  rarely ,  tobacco use : quit 1960  & exercise : water aerobics 3X/ week X 60 min.   Home & personal  safety / fall risk: only related to amputation, activities of daily living:no limitations , seatbelt use : yes , and smoke alarm employment : yes .  Power of Attorney/Living Will status : in place  Vision ( as recorded per Nurse) & Hearing  evaluation :  Ophth exam 6 mos ago; hearing aids from [employer]. Orientation :oriented X 3 , memory & recall : good, spelling or math testing:good,and mood & affect : normal . Depression / anxiety: on meds with response Travel history : last 2005 [place] , immunization status :up to date , transfusion history: as child & post op as adult, last in 1996, and preventive health surveillance ( colonoscopies, BMD , etc as per protocol/ [facility]): colonoscopy up to date, Dental care: every 12 mos . Chart reviewed &  Updated. Active issues reviewed & addressed.      Review of Systems HYPERTENSION: Disease Monitoring: Blood pressure range- 135-160/59  Chest pain, palpitations-no       Dyspnea- no Medications: Compliance- yes  Lightheadedness,Syncope- no    Edema- minor intermittently   FASTING HYPERGLYCEMIA, PMH of:  Disease Monitoring: Blood Sugar ranges-FBS  96-110  Polyuria/phagia/dipsia- no      Visual problems- no   HYPERLIPIDEMIA: Disease Monitoring: See symptoms for Hypertension Medications: Compliance- yes  Abd pain, bowel changes- no   Muscle aches- L thigh X 2 weeks         Objective:   Physical Exam Gen.: Healthy and well-nourished in appearance. Alert, appropriate and cooperative throughout exam.Appears younger than stated age  Head: Normocephalic without obvious  abnormalities  Eyes: No corneal or conjunctival inflammation noted. Pupils equal round reactive to light and accommodation. Fundal exam is benign without hemorrhages, exudate, papilledema. Extraocular motion intact. Vision grossly normal wit lenses. Ears: External  ear exam reveals no significant lesions or deformities.  Hearing aids bilaterally. Nose: External nasal exam reveals no deformity or inflammation. Nasal mucosa are pink and moist. No lesions or exudates noted.   Mouth: Oral mucosa and oropharynx reveal no lesions or exudates. Teeth in good repair. Neck: No deformities, masses, or tenderness noted. Range of motion & Thyroid normal. Lungs: Normal respiratory effort; chest expands symmetrically. Lungs are clear to auscultation without rales, wheezes, or increased work of breathing. Heart: Normal rate and rhythm. Normal S1 and S2. No gallop, click, or rub. Grade 1/2- 1 over 6 systolic murmur  Abdomen: Bowel sounds normal; abdomen soft and nontender. No masses, organomegaly or hernias noted. Genitalia: Dr Arelia Sneddon, Gyn                                          Musculoskeletal/extremities: No deformity or scoliosis noted of  the thoracic or lumbar spine. No clubbing, cyanosis, edema noted. Tone & strength  Normal. Minor DIP osteoarthritic finger changes . Nail health  Good.AKA RLE Vascular: Carotid & radial artery  are full and equal. Minor carotid bruits present. Decreased dorsalis pedis and  posterior tibial pulses LLE Neurologic:  Alert and oriented x3. Deep tendon reflexes symmetrical and normal.          Skin: Intact without suspicious lesions or rashes. Lymph: No cervical, axillary lymphadenopathy present. Psych: Mood and affect are normal. Normally interactive                                                                                       Assessment & Plan:  #1 Medicare Wellness Exam; criteria met ; data entered #2 Problem List reviewed ; Assessment/ Recommendations made Plan:  see Orders

## 2012-03-01 DIAGNOSIS — M719 Bursopathy, unspecified: Secondary | ICD-10-CM | POA: Diagnosis not present

## 2012-03-01 DIAGNOSIS — M67919 Unspecified disorder of synovium and tendon, unspecified shoulder: Secondary | ICD-10-CM | POA: Diagnosis not present

## 2012-03-01 DIAGNOSIS — M75120 Complete rotator cuff tear or rupture of unspecified shoulder, not specified as traumatic: Secondary | ICD-10-CM | POA: Diagnosis not present

## 2012-03-06 ENCOUNTER — Other Ambulatory Visit: Payer: Self-pay | Admitting: Internal Medicine

## 2012-03-07 NOTE — Telephone Encounter (Signed)
Rx printed and faxed.   MW  

## 2012-03-08 ENCOUNTER — Other Ambulatory Visit: Payer: Self-pay

## 2012-03-08 DIAGNOSIS — M719 Bursopathy, unspecified: Secondary | ICD-10-CM | POA: Diagnosis not present

## 2012-03-08 DIAGNOSIS — M67919 Unspecified disorder of synovium and tendon, unspecified shoulder: Secondary | ICD-10-CM | POA: Diagnosis not present

## 2012-03-08 DIAGNOSIS — M75120 Complete rotator cuff tear or rupture of unspecified shoulder, not specified as traumatic: Secondary | ICD-10-CM | POA: Diagnosis not present

## 2012-03-08 MED ORDER — LORAZEPAM 0.5 MG PO TABS: ORAL_TABLET | ORAL | Status: DC

## 2012-03-08 NOTE — Telephone Encounter (Signed)
RX manually faxed to [phone]

## 2012-03-10 DIAGNOSIS — M67919 Unspecified disorder of synovium and tendon, unspecified shoulder: Secondary | ICD-10-CM | POA: Diagnosis not present

## 2012-03-10 DIAGNOSIS — M75120 Complete rotator cuff tear or rupture of unspecified shoulder, not specified as traumatic: Secondary | ICD-10-CM | POA: Diagnosis not present

## 2012-03-23 DIAGNOSIS — M75120 Complete rotator cuff tear or rupture of unspecified shoulder, not specified as traumatic: Secondary | ICD-10-CM | POA: Diagnosis not present

## 2012-04-03 ENCOUNTER — Other Ambulatory Visit: Payer: Self-pay | Admitting: Internal Medicine

## 2012-04-04 ENCOUNTER — Other Ambulatory Visit: Payer: Self-pay | Admitting: Internal Medicine

## 2012-04-04 NOTE — Telephone Encounter (Signed)
Pt refilled meds on 12/3- #90, should not require refill.  No meds at this time.

## 2012-04-04 NOTE — Telephone Encounter (Signed)
Last seen 02/26/12 and filled 03/08/12 # 90. Dr.Hopper's patient. Please advise     KP

## 2012-04-04 NOTE — Telephone Encounter (Signed)
Refill done.  

## 2012-04-05 NOTE — Telephone Encounter (Signed)
I spoke with patient, patient aware rx not to be filled at this time since last filled on 03/08/12. Patient states her husband has alzheimer and he may have got the mail and missed placed her rx, if it was received earlier in the month. Patient will check and call us if needed.

## 2012-04-14 ENCOUNTER — Encounter: Payer: Self-pay | Admitting: Internal Medicine

## 2012-04-15 ENCOUNTER — Telehealth: Payer: Self-pay

## 2012-04-18 MED ORDER — LORAZEPAM 0.5 MG PO TABS: ORAL_TABLET | ORAL | Status: DC

## 2012-04-18 NOTE — Telephone Encounter (Signed)
Message copied form MyChart  I requested a refill of Lorazepam 20mg  on December 2 and it was denied. The last order I received was sent to me on October 21, 2011. Your nurse thought I received a refill in December but I did not. I searched the records on [email or web address] and verified this information. Please approve a refill. I hope you had a good Christmas and a Happy New Year. Kelsey Joseph    I called [facility] to verify rx received on 03/08/12. I was told rx not received. RX will be re-submitted

## 2012-04-22 ENCOUNTER — Other Ambulatory Visit: Payer: Self-pay | Admitting: Internal Medicine

## 2012-05-17 ENCOUNTER — Emergency Department (HOSPITAL_COMMUNITY)
Admission: EM | Admit: 2012-05-17 | Discharge: 2012-05-17 | Disposition: A | Discharge status: Home / Self Care | Payer: Medicare Other | Attending: Emergency Medicine | Admitting: Emergency Medicine

## 2012-05-17 ENCOUNTER — Encounter (HOSPITAL_COMMUNITY): Payer: Self-pay | Admitting: Adult Health

## 2012-05-17 ENCOUNTER — Telehealth: Payer: Self-pay | Admitting: Internal Medicine

## 2012-05-17 DIAGNOSIS — I10 Essential (primary) hypertension: Secondary | ICD-10-CM | POA: Insufficient documentation

## 2012-05-17 DIAGNOSIS — R55 Syncope and collapse: Secondary | ICD-10-CM | POA: Diagnosis not present

## 2012-05-17 DIAGNOSIS — R079 Chest pain, unspecified: Secondary | ICD-10-CM | POA: Insufficient documentation

## 2012-05-17 DIAGNOSIS — Z87891 Personal history of nicotine dependence: Secondary | ICD-10-CM | POA: Diagnosis not present

## 2012-05-17 DIAGNOSIS — Z8781 Personal history of (healed) traumatic fracture: Secondary | ICD-10-CM | POA: Diagnosis not present

## 2012-05-17 DIAGNOSIS — R42 Dizziness and giddiness: Secondary | ICD-10-CM | POA: Insufficient documentation

## 2012-05-17 DIAGNOSIS — Z79899 Other long term (current) drug therapy: Secondary | ICD-10-CM | POA: Diagnosis not present

## 2012-05-17 DIAGNOSIS — Z8739 Personal history of other diseases of the musculoskeletal system and connective tissue: Secondary | ICD-10-CM | POA: Insufficient documentation

## 2012-05-17 DIAGNOSIS — Z7982 Long term (current) use of aspirin: Secondary | ICD-10-CM | POA: Insufficient documentation

## 2012-05-17 HISTORY — DX: Essential (primary) hypertension: I10

## 2012-05-17 LAB — CBC
Hemoglobin: 14.4 g/dL (ref 12.0–15.0)
MCHC: 34.7 g/dL (ref 30.0–36.0)
Platelets: 257 10*3/uL (ref 150–400)
RDW: 12.7 % (ref 11.5–15.5)

## 2012-05-17 LAB — BASIC METABOLIC PANEL
GFR calc Af Amer: >90 mL/min (ref >90)
GFR calc non Af Amer: 78 mL/min — ABNORMAL LOW (ref >90)
Potassium: 4.1 mEq/L (ref 3.5–5.1)
Sodium: 136 mEq/L (ref 135–145)

## 2012-05-17 LAB — POCT I-STAT TROPONIN I: Troponin i, poc: 0.01 ng/mL (ref 0.00–0.08)

## 2012-05-17 MED ORDER — SODIUM CHLORIDE 0.9 % IV BOLUS (SEPSIS): 1000.0000 mL | Freq: Once | INTRAVENOUS | Status: DC

## 2012-05-17 NOTE — ED Notes (Signed)
Pt presents with one week of near syncopal episodes X5, she states, "It feels like a whoosh and then I feel like I am going to pass out. I have been more week than normal" c/o chest heaviness that began this am. The episodes are described as brief and denies SOB and nausea.

## 2012-05-17 NOTE — ED Notes (Signed)
Pt st's she has been having periods of near syncopal episodes over past week.  St's she has not had a syncopal episode because it only last a short period.  Pt alert and oriented x's 3.
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
## 2012-05-17 NOTE — Telephone Encounter (Signed)
Spoke with patient, patient with 5 near syncope episodes with in the last week. Last Episode was today-this am. I had patient hold while I discussed this with Dr.Hopper, per Dr.Hopper have patient seek medical attention at the Emergency Room, patient will need to have someone drive her, patient was not thrilled at the idea of going to the ER. Patient indicates her husband has Alzheimer and she needs to be there to take care of him, I expressed that is more reason to go to r/o any serious conditions that could be the cause of near syncope episodes. Patient to contact her daughter to take her to the ER.   Patient verbalized understanding that is very important that she not put this off and go to the ER, patient assured me that she would go to the ER.  I will f/u (Check patient's chart) to see if she went to a [facility] owned facility. If not I will call patient to f/u

## 2012-05-17 NOTE — Telephone Encounter (Signed)
Patient states she was trying to send Dr. Alwyn Ren a MyChart message regarding some "almost" black-out spells (not passing out) that she has been having over the past week, but her message would not go through. I transferred pt to [facility] for evaluation.

## 2012-05-18 NOTE — ED Provider Notes (Signed)
I have supervised the resident on the management of this patient and agree with the note above. I personally interviewed and examined the patient and my addendum is below.   Kelsey Joseph is a 79 y.o. female here with several episodes of near syncope over the last week. Patient well appearing. Neuro exam unremarkable (has R leg ambulated, otherwise unremarkable). Labs unremarkable. EKG nl. I don't suspect cardiac cause or strokes. Will d/c home with pcp f/u.    Richardean Canal, MD 05/18/12 2150

## 2012-05-21 ENCOUNTER — Other Ambulatory Visit: Payer: Self-pay

## 2012-05-26 DIAGNOSIS — Z13820 Encounter for screening for osteoporosis: Secondary | ICD-10-CM | POA: Diagnosis not present

## 2012-05-26 DIAGNOSIS — E8941 Symptomatic postprocedural ovarian failure: Secondary | ICD-10-CM | POA: Diagnosis not present

## 2012-05-26 DIAGNOSIS — Z1231 Encounter for screening mammogram for malignant neoplasm of breast: Secondary | ICD-10-CM | POA: Diagnosis not present

## 2012-05-26 DIAGNOSIS — Z124 Encounter for screening for malignant neoplasm of cervix: Secondary | ICD-10-CM | POA: Diagnosis not present

## 2012-05-28 ENCOUNTER — Encounter: Payer: Self-pay | Admitting: Internal Medicine

## 2012-05-29 ENCOUNTER — Other Ambulatory Visit: Payer: Self-pay | Admitting: Internal Medicine

## 2012-06-06 ENCOUNTER — Ambulatory Visit: Payer: Medicare Other | Admitting: Internal Medicine

## 2012-06-08 ENCOUNTER — Encounter: Payer: Self-pay | Admitting: Internal Medicine

## 2012-06-08 ENCOUNTER — Ambulatory Visit (INDEPENDENT_AMBULATORY_CARE_PROVIDER_SITE_OTHER): Payer: Medicare Other | Admitting: Internal Medicine

## 2012-06-08 VITALS — BP 138/72 | HR 68 | Temp 97.7°F | Wt 133.0 lb

## 2012-06-08 DIAGNOSIS — I479 Paroxysmal tachycardia, unspecified: Secondary | ICD-10-CM | POA: Diagnosis not present

## 2012-06-08 DIAGNOSIS — E039 Hypothyroidism, unspecified: Secondary | ICD-10-CM

## 2012-06-08 DIAGNOSIS — R55 Syncope and collapse: Secondary | ICD-10-CM | POA: Diagnosis not present

## 2012-06-08 NOTE — Patient Instructions (Addendum)
If the near syncope or passing out recurs or if having intermittent tachycardia; a cardiac monitor will be ordered.

## 2012-06-08 NOTE — Progress Notes (Signed)
  Subjective:    Patient ID: Kelsey Joseph, female    DOB: 1927-09-16, 79 y.o.   MRN: [id number]  HPI  05/17/12 ER notes were reviewed. She was seen for near syncope. There was no specific positional, neurologic, or cardiac trigger or prodrome. Specifically she denied headache, numbness and tingling, or limb weakness prior to the symptoms. She also denied chest pain or palpitations.  He BUN was minimally elevated at 25; GFR 78; and nonfasting glucose 142.  Since that appointment; she has had no significant near syncopal episodes but has had intermittent tachycardia. Her gynecologist documented tachycardia 2/20.EKG 11 2/11 revealed first-degree AV block but was otherwise normal.  Her last TSH was 0.32 on 02/19/12      Review of Systems Yesterday she noted some visual changes of " zigzag's and flashing lights" with  associated mild  headache lasting approximately 5 minutes. This has happened in the past; she questioned possible visual migraine without significant headache.       Objective:   Physical Exam  Gen.:  well-nourished; in no acute distress Eyes: Extraocular motion intact; no lid lag or proptosis ,hyperpigmentation of lateral sclerae Neck:  no masses ; thyroid normal  Heart: Normal rhythm and rate with Grade 1 murmur, gallop, or extra heart sounds Lungs: Chest clear to auscultation without rales,rales, wheezes Neuro:Deep tendon reflexes are equal and 1/2+ @ UE; no tremor . Prosthesis RLE Skin: Warm and dry without significant lesions or rashes; no onycholysis Lymphatic: no cervical or axillary LA Psych: Normally communicative and interactive; no abnormal mood or affect clinically.         Assessment & Plan:   #1near syncope, essentially resolved  #2 intermittent tachycardia  #3 TSH less than 0.35 in 11/13  #4 possible visual migraine  Plan: See orders and recommendations. If near syncope recurs and thyroid issues are not present; an event monitor would be  considered

## 2012-06-09 LAB — TSH: TSH: 0.7 u[IU]/mL (ref 0.35–5.50)

## 2012-06-20 ENCOUNTER — Ambulatory Visit (INDEPENDENT_AMBULATORY_CARE_PROVIDER_SITE_OTHER): Payer: Medicare Other | Admitting: Family Medicine

## 2012-06-20 ENCOUNTER — Ambulatory Visit: Payer: Medicare Other

## 2012-06-20 VITALS — BP 140/57 | HR 71 | Temp 98.4°F | Resp 18 | Wt 132.0 lb

## 2012-06-20 DIAGNOSIS — I10 Essential (primary) hypertension: Secondary | ICD-10-CM

## 2012-06-20 DIAGNOSIS — R05 Cough: Secondary | ICD-10-CM

## 2012-06-20 DIAGNOSIS — R059 Cough, unspecified: Secondary | ICD-10-CM

## 2012-06-20 DIAGNOSIS — J209 Acute bronchitis, unspecified: Secondary | ICD-10-CM

## 2012-06-20 LAB — POCT CBC
Granulocyte percent: 53.1 %G (ref 37–80)
HCT, POC: 41 % (ref 37.7–47.9)
MCV: 95.4 fL (ref 80–97)
RBC: 4.3 M/uL (ref 4.04–5.48)

## 2012-06-20 MED ORDER — BENZONATATE 200 MG PO CAPS: 200.0000 mg | ORAL_CAPSULE | Freq: Three times a day (TID) | ORAL | Status: DC | PRN

## 2012-06-20 MED ORDER — AZITHROMYCIN 250 MG PO TABS: ORAL_TABLET | ORAL | Status: DC

## 2012-06-20 MED ORDER — IPRATROPIUM BROMIDE 0.03 % NA SOLN: 2.0000 | Freq: Four times a day (QID) | NASAL | Status: DC

## 2012-06-20 NOTE — Progress Notes (Signed)
  Subjective:    Patient ID: Kelsey Joseph, female    DOB: 1927-06-08, 79 y.o.   MRN: [id number] Chief Complaint  Patient presents with  . Cough  . URI    HPI  URI and cough x 2 wks.  No f/c, a little congesting but mainly in chest.  Has been taking some nyquil/dayquil and mucinex.  Cough occ productive of yellow and clear - same with rhinitis. Some blood. No HAs or sinus pressure, no lightheaded but is having some right ear pain and fullness. No sore throat or trouble swallowing (that has gone away).  No SHoB or CP. Tol po    Review of Systems    BP 140/57  Pulse 71  Temp(Src) 98.4 F (36.9 C) (Oral)  Resp 18  Wt 132 lb (59.875 kg)  BMI 24.08 kg/m2 Objective:   Physical Exam  Pulmonary/Chest: No accessory muscle usage. Not tachypneic. No respiratory distress. She has no decreased breath sounds. She has wheezes in the right lower field and the left lower field. She has rhonchi in the left upper field. She has rales in the right upper field, the right lower field and the left lower field.         [facility] reading (PRIMARY) by  Dr. Clelia Croft. No acute abnormality  Assessment & Plan:  HYPERTENSION  Cough - Plan: DG Chest 2 View, POCT CBC  Acute bronchitis  Meds ordered this encounter  Medications  . bromfenac (XIBROM) 0.09 % ophthalmic solution    Sig:   . estradiol (ESTRACE) 0.5 MG tablet    Sig:   . triamcinolone cream (KENALOG) 0.1 %    Sig:                                                . azithromycin (ZITHROMAX) 250 MG tablet    Sig: Take 2 tabs PO x 1 dose, then 1 tab PO QD x 4 days    Dispense:  6 tablet    Refill:  0  . benzonatate (TESSALON) 200 MG capsule    Sig: Take 1 capsule (200 mg total) by mouth 3 (three) times daily as needed for cough.    Dispense:  30 capsule    Refill:  0  . ipratropium (ATROVENT) 0.03 % nasal spray    Sig: Place 2 sprays into the nose 4 (four) times daily.    Dispense:  30 mL    Refill:  1

## 2012-06-20 NOTE — Patient Instructions (Addendum)

## 2012-07-11 DIAGNOSIS — M19049 Primary osteoarthritis, unspecified hand: Secondary | ICD-10-CM | POA: Diagnosis not present

## 2012-07-25 DIAGNOSIS — D1801 Hemangioma of skin and subcutaneous tissue: Secondary | ICD-10-CM | POA: Diagnosis not present

## 2012-07-25 DIAGNOSIS — L821 Other seborrheic keratosis: Secondary | ICD-10-CM | POA: Diagnosis not present

## 2012-07-25 DIAGNOSIS — Z85828 Personal history of other malignant neoplasm of skin: Secondary | ICD-10-CM | POA: Diagnosis not present

## 2012-07-25 DIAGNOSIS — L570 Actinic keratosis: Secondary | ICD-10-CM | POA: Diagnosis not present

## 2012-10-29 ENCOUNTER — Other Ambulatory Visit: Payer: Self-pay | Admitting: Internal Medicine

## 2012-11-04 ENCOUNTER — Other Ambulatory Visit: Payer: Self-pay | Admitting: Internal Medicine

## 2012-11-09 ENCOUNTER — Other Ambulatory Visit: Payer: Self-pay

## 2012-11-10 ENCOUNTER — Encounter: Payer: Self-pay | Admitting: Internal Medicine

## 2012-11-10 DIAGNOSIS — R739 Hyperglycemia, unspecified: Secondary | ICD-10-CM

## 2012-11-17 NOTE — Telephone Encounter (Signed)
Left message on voicemail informing patient she is due for labs and relayed Dr.Hopper's response to her request for DM testing supplies

## 2012-11-17 NOTE — Telephone Encounter (Signed)
Hopp have you seen a request for DM supplies on this patient?

## 2012-12-07 DIAGNOSIS — Z23 Encounter for immunization: Secondary | ICD-10-CM | POA: Diagnosis not present

## 2012-12-20 DIAGNOSIS — M1710 Unilateral primary osteoarthritis, unspecified knee: Secondary | ICD-10-CM | POA: Diagnosis not present

## 2012-12-20 DIAGNOSIS — M545 Low back pain: Secondary | ICD-10-CM | POA: Diagnosis not present

## 2012-12-20 DIAGNOSIS — M25559 Pain in unspecified hip: Secondary | ICD-10-CM | POA: Diagnosis not present

## 2012-12-22 DIAGNOSIS — H16219 Exposure keratoconjunctivitis, unspecified eye: Secondary | ICD-10-CM | POA: Diagnosis not present

## 2012-12-22 DIAGNOSIS — H04129 Dry eye syndrome of unspecified lacrimal gland: Secondary | ICD-10-CM | POA: Diagnosis not present

## 2012-12-22 DIAGNOSIS — H02839 Dermatochalasis of unspecified eye, unspecified eyelid: Secondary | ICD-10-CM | POA: Diagnosis not present

## 2012-12-22 DIAGNOSIS — H40019 Open angle with borderline findings, low risk, unspecified eye: Secondary | ICD-10-CM | POA: Diagnosis not present

## 2012-12-22 DIAGNOSIS — Z961 Presence of intraocular lens: Secondary | ICD-10-CM | POA: Diagnosis not present

## 2013-01-12 ENCOUNTER — Other Ambulatory Visit: Payer: Self-pay | Admitting: Orthopedic Surgery

## 2013-01-12 DIAGNOSIS — M545 Low back pain: Secondary | ICD-10-CM

## 2013-01-12 DIAGNOSIS — M47816 Spondylosis without myelopathy or radiculopathy, lumbar region: Secondary | ICD-10-CM

## 2013-01-23 ENCOUNTER — Ambulatory Visit
Admission: RE | Admit: 2013-01-23 | Discharge: 2013-01-23 | Disposition: A | Discharge status: Home / Self Care | Payer: Medicare Other | Source: Ambulatory Visit | Attending: Orthopedic Surgery | Admitting: Orthopedic Surgery

## 2013-01-23 DIAGNOSIS — M545 Low back pain, unspecified: Secondary | ICD-10-CM

## 2013-01-23 DIAGNOSIS — M47816 Spondylosis without myelopathy or radiculopathy, lumbar region: Secondary | ICD-10-CM

## 2013-01-23 DIAGNOSIS — M48061 Spinal stenosis, lumbar region without neurogenic claudication: Secondary | ICD-10-CM | POA: Diagnosis not present

## 2013-01-23 DIAGNOSIS — M5126 Other intervertebral disc displacement, lumbar region: Secondary | ICD-10-CM | POA: Diagnosis not present

## 2013-01-26 DIAGNOSIS — M48061 Spinal stenosis, lumbar region without neurogenic claudication: Secondary | ICD-10-CM | POA: Diagnosis not present

## 2013-01-27 ENCOUNTER — Other Ambulatory Visit: Payer: Self-pay | Admitting: Internal Medicine

## 2013-01-27 NOTE — Telephone Encounter (Signed)
Med filled. Pt due for follow up.  

## 2013-02-01 DIAGNOSIS — IMO0002 Reserved for concepts with insufficient information to code with codable children: Secondary | ICD-10-CM | POA: Diagnosis not present

## 2013-02-09 ENCOUNTER — Other Ambulatory Visit: Payer: Self-pay

## 2013-02-13 ENCOUNTER — Other Ambulatory Visit (INDEPENDENT_AMBULATORY_CARE_PROVIDER_SITE_OTHER): Payer: Medicare Other

## 2013-02-13 DIAGNOSIS — R7309 Other abnormal glucose: Secondary | ICD-10-CM

## 2013-02-13 DIAGNOSIS — R739 Hyperglycemia, unspecified: Secondary | ICD-10-CM

## 2013-02-13 LAB — HEMOGLOBIN A1C: Hgb A1c MFr Bld: 6.4 % (ref 4.6–6.5)

## 2013-02-13 LAB — MICROALBUMIN / CREATININE URINE RATIO
Creatinine,U: 50 mg/dL
Microalb Creat Ratio: 1.4 mg/g (ref 0.0–30.0)

## 2013-02-14 DIAGNOSIS — IMO0002 Reserved for concepts with insufficient information to code with codable children: Secondary | ICD-10-CM | POA: Diagnosis not present

## 2013-02-22 ENCOUNTER — Other Ambulatory Visit: Payer: Self-pay | Admitting: Internal Medicine

## 2013-02-24 ENCOUNTER — Other Ambulatory Visit: Payer: Self-pay | Admitting: Internal Medicine

## 2013-02-24 NOTE — Telephone Encounter (Signed)
Patient is calling to check the status of her medication refill request. Please advise.

## 2013-02-26 NOTE — Telephone Encounter (Signed)
Simvastatin refilled per protocol.  

## 2013-02-26 NOTE — Telephone Encounter (Signed)
Levothyroxine refilled per protocol 

## 2013-02-27 ENCOUNTER — Other Ambulatory Visit: Payer: Self-pay | Admitting: Internal Medicine

## 2013-02-27 DIAGNOSIS — IMO0002 Reserved for concepts with insufficient information to code with codable children: Secondary | ICD-10-CM | POA: Diagnosis not present

## 2013-02-28 NOTE — Telephone Encounter (Signed)
Citalopram refilled per protocol 

## 2013-03-07 DIAGNOSIS — IMO0002 Reserved for concepts with insufficient information to code with codable children: Secondary | ICD-10-CM | POA: Diagnosis not present

## 2013-03-25 ENCOUNTER — Other Ambulatory Visit: Payer: Self-pay | Admitting: Internal Medicine

## 2013-03-28 NOTE — Telephone Encounter (Signed)
Metoprolol, Amlodipine, and Torsemide refilled per protocol. JG//CMA

## 2013-04-01 ENCOUNTER — Other Ambulatory Visit: Payer: Self-pay | Admitting: Internal Medicine

## 2013-04-03 DIAGNOSIS — IMO0001 Reserved for inherently not codable concepts without codable children: Secondary | ICD-10-CM | POA: Diagnosis not present

## 2013-04-03 DIAGNOSIS — M25519 Pain in unspecified shoulder: Secondary | ICD-10-CM | POA: Diagnosis not present

## 2013-04-03 DIAGNOSIS — IMO0002 Reserved for concepts with insufficient information to code with codable children: Secondary | ICD-10-CM | POA: Diagnosis not present

## 2013-04-03 NOTE — Telephone Encounter (Signed)
Avapro refilled per protocol. JG//CMA 

## 2013-04-05 ENCOUNTER — Other Ambulatory Visit: Payer: Self-pay | Admitting: Internal Medicine

## 2013-04-05 NOTE — Telephone Encounter (Signed)
Montelukast refilled per protocol. JG//CMA 

## 2013-05-01 DIAGNOSIS — M25519 Pain in unspecified shoulder: Secondary | ICD-10-CM | POA: Diagnosis not present

## 2013-05-02 DIAGNOSIS — M48061 Spinal stenosis, lumbar region without neurogenic claudication: Secondary | ICD-10-CM | POA: Diagnosis not present

## 2013-05-19 DIAGNOSIS — M1610 Unilateral primary osteoarthritis, unspecified hip: Secondary | ICD-10-CM | POA: Diagnosis not present

## 2013-05-23 DIAGNOSIS — M67919 Unspecified disorder of synovium and tendon, unspecified shoulder: Secondary | ICD-10-CM | POA: Diagnosis not present

## 2013-05-23 DIAGNOSIS — M25519 Pain in unspecified shoulder: Secondary | ICD-10-CM | POA: Diagnosis not present

## 2013-05-23 DIAGNOSIS — M25559 Pain in unspecified hip: Secondary | ICD-10-CM | POA: Diagnosis not present

## 2013-05-23 DIAGNOSIS — M719 Bursopathy, unspecified: Secondary | ICD-10-CM | POA: Diagnosis not present

## 2013-05-29 ENCOUNTER — Other Ambulatory Visit: Payer: Self-pay | Admitting: Internal Medicine

## 2013-05-30 NOTE — Telephone Encounter (Signed)
Rx sent to the pharmacy by e-script.  Pt needs complete physical.//AB/CMA 

## 2013-06-03 ENCOUNTER — Other Ambulatory Visit: Payer: Self-pay | Admitting: Internal Medicine

## 2013-06-05 DIAGNOSIS — M19019 Primary osteoarthritis, unspecified shoulder: Secondary | ICD-10-CM | POA: Diagnosis not present

## 2013-06-06 NOTE — Telephone Encounter (Signed)
Rx sent to the pharmacy by e-script.  Pt needs complete physical and fasting labs.//AB/CMA 

## 2013-06-08 DIAGNOSIS — M25559 Pain in unspecified hip: Secondary | ICD-10-CM | POA: Diagnosis not present

## 2013-06-08 DIAGNOSIS — M75120 Complete rotator cuff tear or rupture of unspecified shoulder, not specified as traumatic: Secondary | ICD-10-CM | POA: Diagnosis not present

## 2013-06-11 ENCOUNTER — Other Ambulatory Visit: Payer: Self-pay | Admitting: Internal Medicine

## 2013-06-13 DIAGNOSIS — N390 Urinary tract infection, site not specified: Secondary | ICD-10-CM | POA: Diagnosis not present

## 2013-06-13 DIAGNOSIS — R7309 Other abnormal glucose: Secondary | ICD-10-CM | POA: Diagnosis not present

## 2013-06-13 DIAGNOSIS — Z23 Encounter for immunization: Secondary | ICD-10-CM | POA: Diagnosis not present

## 2013-06-13 DIAGNOSIS — E7800 Pure hypercholesterolemia, unspecified: Secondary | ICD-10-CM | POA: Diagnosis not present

## 2013-06-13 DIAGNOSIS — G4733 Obstructive sleep apnea (adult) (pediatric): Secondary | ICD-10-CM | POA: Diagnosis not present

## 2013-06-13 DIAGNOSIS — I10 Essential (primary) hypertension: Secondary | ICD-10-CM | POA: Diagnosis not present

## 2013-06-13 DIAGNOSIS — E039 Hypothyroidism, unspecified: Secondary | ICD-10-CM | POA: Diagnosis not present

## 2013-06-13 DIAGNOSIS — Z91040 Latex allergy status: Secondary | ICD-10-CM | POA: Diagnosis not present

## 2013-06-28 DIAGNOSIS — Z01419 Encounter for gynecological examination (general) (routine) without abnormal findings: Secondary | ICD-10-CM | POA: Diagnosis not present

## 2013-06-28 DIAGNOSIS — N8110 Cystocele, unspecified: Secondary | ICD-10-CM | POA: Diagnosis not present

## 2013-06-28 DIAGNOSIS — Z1231 Encounter for screening mammogram for malignant neoplasm of breast: Secondary | ICD-10-CM | POA: Diagnosis not present

## 2013-06-30 ENCOUNTER — Other Ambulatory Visit: Payer: Self-pay | Admitting: Internal Medicine

## 2013-07-01 ENCOUNTER — Inpatient Hospital Stay (HOSPITAL_COMMUNITY)
Admission: EM | Admit: 2013-07-01 | Discharge: 2013-07-04 | DRG: 872 | Disposition: A | Discharge status: Home / Self Care | Payer: Medicare Other | Attending: Internal Medicine | Admitting: Internal Medicine

## 2013-07-01 ENCOUNTER — Encounter (HOSPITAL_COMMUNITY): Payer: Self-pay | Admitting: Emergency Medicine

## 2013-07-01 DIAGNOSIS — I10 Essential (primary) hypertension: Secondary | ICD-10-CM | POA: Diagnosis present

## 2013-07-01 DIAGNOSIS — E039 Hypothyroidism, unspecified: Secondary | ICD-10-CM | POA: Diagnosis present

## 2013-07-01 DIAGNOSIS — I2510 Atherosclerotic heart disease of native coronary artery without angina pectoris: Secondary | ICD-10-CM | POA: Diagnosis present

## 2013-07-01 DIAGNOSIS — I4891 Unspecified atrial fibrillation: Secondary | ICD-10-CM | POA: Diagnosis present

## 2013-07-01 DIAGNOSIS — E785 Hyperlipidemia, unspecified: Secondary | ICD-10-CM | POA: Diagnosis present

## 2013-07-01 DIAGNOSIS — N390 Urinary tract infection, site not specified: Secondary | ICD-10-CM | POA: Diagnosis present

## 2013-07-01 DIAGNOSIS — K219 Gastro-esophageal reflux disease without esophagitis: Secondary | ICD-10-CM | POA: Diagnosis present

## 2013-07-01 DIAGNOSIS — H9190 Unspecified hearing loss, unspecified ear: Secondary | ICD-10-CM | POA: Diagnosis present

## 2013-07-01 DIAGNOSIS — M25551 Pain in right hip: Secondary | ICD-10-CM

## 2013-07-01 DIAGNOSIS — M79609 Pain in unspecified limb: Secondary | ICD-10-CM | POA: Diagnosis not present

## 2013-07-01 DIAGNOSIS — I059 Rheumatic mitral valve disease, unspecified: Secondary | ICD-10-CM | POA: Diagnosis not present

## 2013-07-01 DIAGNOSIS — I498 Other specified cardiac arrhythmias: Secondary | ICD-10-CM | POA: Diagnosis not present

## 2013-07-01 DIAGNOSIS — G4730 Sleep apnea, unspecified: Secondary | ICD-10-CM | POA: Diagnosis present

## 2013-07-01 DIAGNOSIS — E7800 Pure hypercholesterolemia, unspecified: Secondary | ICD-10-CM | POA: Diagnosis present

## 2013-07-01 DIAGNOSIS — G547 Phantom limb syndrome without pain: Secondary | ICD-10-CM | POA: Diagnosis present

## 2013-07-01 DIAGNOSIS — I959 Hypotension, unspecified: Secondary | ICD-10-CM | POA: Diagnosis not present

## 2013-07-01 DIAGNOSIS — A419 Sepsis, unspecified organism: Secondary | ICD-10-CM | POA: Diagnosis not present

## 2013-07-01 DIAGNOSIS — S78119A Complete traumatic amputation at level between unspecified hip and knee, initial encounter: Secondary | ICD-10-CM

## 2013-07-01 DIAGNOSIS — R6889 Other general symptoms and signs: Secondary | ICD-10-CM | POA: Diagnosis not present

## 2013-07-01 DIAGNOSIS — M25559 Pain in unspecified hip: Secondary | ICD-10-CM | POA: Diagnosis not present

## 2013-07-01 DIAGNOSIS — R0989 Other specified symptoms and signs involving the circulatory and respiratory systems: Secondary | ICD-10-CM | POA: Diagnosis not present

## 2013-07-01 DIAGNOSIS — G546 Phantom limb syndrome with pain: Secondary | ICD-10-CM | POA: Diagnosis present

## 2013-07-01 HISTORY — DX: Sleep apnea, unspecified: G47.30

## 2013-07-01 HISTORY — DX: Unspecified hearing loss, unspecified ear: H91.90

## 2013-07-01 HISTORY — DX: Hyperlipidemia, unspecified: E78.5

## 2013-07-01 NOTE — ED Notes (Signed)
Unable to  Speak with the pt yet edp at bedside now.  Med tech before that and registration

## 2013-07-01 NOTE — ED Notes (Signed)
Pt alert on arrival to ED. Daughter at bedside

## 2013-07-01 NOTE — ED Notes (Signed)
Pt c/o right leg pain since 2pm  Pt had aKA in 1989. Daughter at bedside.  Pt took anxiety med pta

## 2013-07-02 ENCOUNTER — Emergency Department (HOSPITAL_COMMUNITY): Payer: Medicare Other

## 2013-07-02 ENCOUNTER — Encounter (HOSPITAL_COMMUNITY): Payer: Self-pay | Admitting: Emergency Medicine

## 2013-07-02 DIAGNOSIS — N390 Urinary tract infection, site not specified: Secondary | ICD-10-CM

## 2013-07-02 DIAGNOSIS — R0989 Other specified symptoms and signs involving the circulatory and respiratory systems: Secondary | ICD-10-CM | POA: Diagnosis not present

## 2013-07-02 DIAGNOSIS — G547 Phantom limb syndrome without pain: Secondary | ICD-10-CM

## 2013-07-02 DIAGNOSIS — I959 Hypotension, unspecified: Secondary | ICD-10-CM | POA: Diagnosis present

## 2013-07-02 DIAGNOSIS — I4891 Unspecified atrial fibrillation: Secondary | ICD-10-CM | POA: Diagnosis not present

## 2013-07-02 DIAGNOSIS — A419 Sepsis, unspecified organism: Principal | ICD-10-CM

## 2013-07-02 DIAGNOSIS — E7800 Pure hypercholesterolemia, unspecified: Secondary | ICD-10-CM | POA: Diagnosis present

## 2013-07-02 DIAGNOSIS — G546 Phantom limb syndrome with pain: Secondary | ICD-10-CM | POA: Diagnosis present

## 2013-07-02 DIAGNOSIS — M25559 Pain in unspecified hip: Secondary | ICD-10-CM | POA: Diagnosis not present

## 2013-07-02 DIAGNOSIS — E039 Hypothyroidism, unspecified: Secondary | ICD-10-CM | POA: Diagnosis present

## 2013-07-02 LAB — CBC WITH DIFFERENTIAL/PLATELET
BASOS PCT: 0 % (ref 0–1)
Basophils Absolute: 0 10*3/uL (ref 0.0–0.1)
EOS ABS: 0 10*3/uL (ref 0.0–0.7)
Eosinophils Relative: 0 % (ref 0–5)
HCT: 39.2 % (ref 36.0–46.0)
Hemoglobin: 13.6 g/dL (ref 12.0–15.0)
LYMPHS ABS: 1 10*3/uL (ref 0.7–4.0)
Lymphocytes Relative: 7 % — ABNORMAL LOW (ref 12–46)
MCH: 31.3 pg (ref 26.0–34.0)
MCHC: 34.7 g/dL (ref 30.0–36.0)
MCV: 90.3 fL (ref 78.0–100.0)
Monocytes Absolute: 1.1 10*3/uL — ABNORMAL HIGH (ref 0.1–1.0)
Monocytes Relative: 7 % (ref 3–12)
NEUTROS PCT: 85 % — AB (ref 43–77)
Neutro Abs: 12.5 10*3/uL — ABNORMAL HIGH (ref 1.7–7.7)
Platelets: 209 10*3/uL (ref 150–400)
RBC: 4.34 MIL/uL (ref 3.87–5.11)
RDW: 13.2 % (ref 11.5–15.5)
WBC: 14.6 10*3/uL — ABNORMAL HIGH (ref 4.0–10.5)

## 2013-07-02 LAB — CBC
HCT: 35.8 % — ABNORMAL LOW (ref 36.0–46.0)
Hemoglobin: 12.5 g/dL (ref 12.0–15.0)
MCH: 31.6 pg (ref 26.0–34.0)
MCHC: 34.9 g/dL (ref 30.0–36.0)
MCV: 90.4 fL (ref 78.0–100.0)
Platelets: 207 10*3/uL (ref 150–400)
RBC: 3.96 MIL/uL (ref 3.87–5.11)
RDW: 13.4 % (ref 11.5–15.5)
WBC: 14.9 10*3/uL — ABNORMAL HIGH (ref 4.0–10.5)

## 2013-07-02 LAB — BASIC METABOLIC PANEL
BUN: 22 mg/dL (ref 6–23)
BUN: 20 mg/dL (ref 6–23)
CALCIUM: 8.3 mg/dL — AB (ref 8.4–10.5)
CO2: 22 mEq/L (ref 19–32)
CO2: 23 mEq/L (ref 19–32)
Calcium: 8.9 mg/dL (ref 8.4–10.5)
Chloride: 95 mEq/L — ABNORMAL LOW (ref 96–112)
Chloride: 98 mEq/L (ref 96–112)
Creatinine, Ser: 0.7 mg/dL (ref 0.50–1.10)
Creatinine, Ser: 0.77 mg/dL (ref 0.50–1.10)
GFR calc Af Amer: 87 mL/min — ABNORMAL LOW (ref >90)
GFR, EST AFRICAN AMERICAN: 90 mL/min — AB (ref >90)
GFR, EST NON AFRICAN AMERICAN: 75 mL/min — AB (ref >90)
GFR, EST NON AFRICAN AMERICAN: 77 mL/min — AB (ref >90)
GLUCOSE: 155 mg/dL — AB (ref 70–99)
Glucose, Bld: 158 mg/dL — ABNORMAL HIGH (ref 70–99)
Potassium: 3.9 mEq/L (ref 3.7–5.3)
Potassium: 3.5 mEq/L — ABNORMAL LOW (ref 3.7–5.3)
SODIUM: 135 meq/L — AB (ref 137–147)
Sodium: 136 mEq/L — ABNORMAL LOW (ref 137–147)

## 2013-07-02 LAB — URINALYSIS, ROUTINE W REFLEX MICROSCOPIC
Bilirubin Urine: NEGATIVE
Glucose, UA: NEGATIVE mg/dL
Ketones, ur: NEGATIVE mg/dL
NITRITE: POSITIVE — AB
Protein, ur: NEGATIVE mg/dL
SPECIFIC GRAVITY, URINE: 1.01 (ref 1.005–1.030)
UROBILINOGEN UA: 0.2 mg/dL (ref 0.0–1.0)
pH: 6 (ref 5.0–8.0)

## 2013-07-02 LAB — URINE MICROSCOPIC-ADD ON

## 2013-07-02 LAB — HEPARIN LEVEL (UNFRACTIONATED)
HEPARIN UNFRACTIONATED: 0.28 [IU]/mL — AB (ref 0.30–0.70)
HEPARIN UNFRACTIONATED: 0.24 [IU]/mL — AB (ref 0.30–0.70)

## 2013-07-02 LAB — I-STAT CG4 LACTIC ACID, ED: LACTIC ACID, VENOUS: 1.07 mmol/L (ref 0.5–2.2)

## 2013-07-02 LAB — MRSA PCR SCREENING: MRSA by PCR: NEGATIVE

## 2013-07-02 MED ORDER — CEFTRIAXONE SODIUM 1 G IJ SOLR
1.0000 g | INTRAMUSCULAR | Status: DC
  Administered 2013-07-03 – 2013-07-04 (×2): 1 g via INTRAVENOUS
  Filled 2013-07-02 (×4): qty 10

## 2013-07-02 MED ORDER — ONDANSETRON HCL 4 MG/2ML IJ SOLN
4.0000 mg | Freq: Four times a day (QID) | INTRAMUSCULAR | Status: DC | PRN
Start: 2013-07-02 — End: 2013-07-04

## 2013-07-02 MED ORDER — ACETAMINOPHEN 325 MG PO TABS: 650.0000 mg | ORAL_TABLET | Freq: Four times a day (QID) | ORAL | Status: DC | PRN

## 2013-07-02 MED ORDER — HEPARIN BOLUS VIA INFUSION
2000.0000 [IU] | Freq: Once | INTRAVENOUS | Status: AC
  Administered 2013-07-02: 2000 [IU] via INTRAVENOUS
  Filled 2013-07-02: qty 2000

## 2013-07-02 MED ORDER — DILTIAZEM LOAD VIA INFUSION
15.0000 mg | Freq: Once | INTRAVENOUS | Status: AC
  Administered 2013-07-02: 15 mg via INTRAVENOUS
  Filled 2013-07-02: qty 15

## 2013-07-02 MED ORDER — ESTRADIOL 1 MG PO TABS
0.5000 mg | ORAL_TABLET | Freq: Every day | ORAL | Status: DC
  Administered 2013-07-02 – 2013-07-04 (×3): 0.5 mg via ORAL
  Filled 2013-07-02 (×3): qty 0.5

## 2013-07-02 MED ORDER — LORAZEPAM 0.5 MG PO TABS
0.5000 mg | ORAL_TABLET | Freq: Three times a day (TID) | ORAL | Status: DC
  Administered 2013-07-02 – 2013-07-03 (×4): 0.5 mg via ORAL
  Filled 2013-07-02 (×4): qty 1

## 2013-07-02 MED ORDER — MELOXICAM 15 MG PO TABS
15.0000 mg | ORAL_TABLET | Freq: Every evening | ORAL | Status: DC
  Administered 2013-07-02 – 2013-07-03 (×2): 15 mg via ORAL
  Filled 2013-07-02 (×3): qty 1

## 2013-07-02 MED ORDER — TRAMADOL HCL 50 MG PO TABS
50.0000 mg | ORAL_TABLET | Freq: Four times a day (QID) | ORAL | Status: DC | PRN
  Administered 2013-07-02: 50 mg via ORAL
  Filled 2013-07-02: qty 1

## 2013-07-02 MED ORDER — METOPROLOL TARTRATE 50 MG PO TABS
50.0000 mg | ORAL_TABLET | Freq: Two times a day (BID) | ORAL | Status: DC
  Administered 2013-07-02 – 2013-07-04 (×5): 50 mg via ORAL
  Filled 2013-07-02 (×7): qty 1

## 2013-07-02 MED ORDER — HYDROMORPHONE HCL PF 1 MG/ML IJ SOLN: 0.5000 mg | INTRAMUSCULAR | Status: DC | PRN

## 2013-07-02 MED ORDER — ONDANSETRON HCL 4 MG PO TABS: 4.0000 mg | ORAL_TABLET | Freq: Four times a day (QID) | ORAL | Status: DC | PRN

## 2013-07-02 MED ORDER — SODIUM CHLORIDE 0.9 % IV BOLUS (SEPSIS)
1000.0000 mL | Freq: Once | INTRAVENOUS | Status: AC
  Administered 2013-07-02: 1000 mL via INTRAVENOUS

## 2013-07-02 MED ORDER — CITALOPRAM HYDROBROMIDE 10 MG PO TABS
10.0000 mg | ORAL_TABLET | Freq: Every day | ORAL | Status: DC
  Administered 2013-07-02 – 2013-07-03 (×2): 10 mg via ORAL
  Filled 2013-07-02 (×4): qty 1

## 2013-07-02 MED ORDER — CYCLOBENZAPRINE HCL 5 MG PO TABS
5.0000 mg | ORAL_TABLET | Freq: Two times a day (BID) | ORAL | Status: DC | PRN
  Administered 2013-07-02 – 2013-07-03 (×2): 5 mg via ORAL
  Filled 2013-07-02: qty 1
  Filled 2013-07-02: qty 0.5
  Filled 2013-07-02: qty 1

## 2013-07-02 MED ORDER — ACETAMINOPHEN 325 MG PO TABS
650.0000 mg | ORAL_TABLET | Freq: Once | ORAL | Status: AC
  Administered 2013-07-02: 650 mg via ORAL
  Filled 2013-07-02: qty 2

## 2013-07-02 MED ORDER — FENTANYL CITRATE 0.05 MG/ML IJ SOLN
40.0000 ug | Freq: Once | INTRAMUSCULAR | Status: AC
  Administered 2013-07-02: 40 ug via INTRAVENOUS
  Filled 2013-07-02: qty 2

## 2013-07-02 MED ORDER — ONDANSETRON HCL 4 MG/2ML IJ SOLN: 4.0000 mg | Freq: Three times a day (TID) | INTRAMUSCULAR | Status: DC | PRN

## 2013-07-02 MED ORDER — CALCIUM CARBONATE-VITAMIN D 500-200 MG-UNIT PO TABS
1.0000 | ORAL_TABLET | Freq: Every day | ORAL | Status: DC
  Administered 2013-07-02 – 2013-07-03 (×2): 1 via ORAL
  Filled 2013-07-02 (×3): qty 1

## 2013-07-02 MED ORDER — OXYCODONE HCL 5 MG PO TABS
5.0000 mg | ORAL_TABLET | ORAL | Status: DC | PRN
  Administered 2013-07-02 – 2013-07-04 (×2): 5 mg via ORAL
  Filled 2013-07-02 (×2): qty 1

## 2013-07-02 MED ORDER — ADULT MULTIVITAMIN W/MINERALS CH
1.0000 | ORAL_TABLET | Freq: Every day | ORAL | Status: DC
  Administered 2013-07-02 – 2013-07-04 (×3): 1 via ORAL
  Filled 2013-07-02 (×3): qty 1

## 2013-07-02 MED ORDER — DILTIAZEM HCL 100 MG IV SOLR
5.0000 mg/h | INTRAVENOUS | Status: DC
  Administered 2013-07-02: 10 mg/h via INTRAVENOUS
  Administered 2013-07-02: 5 mg/h via INTRAVENOUS
  Filled 2013-07-02: qty 100

## 2013-07-02 MED ORDER — PHILLIPS COLON HEALTH PO CAPS: 1.0000 | ORAL_CAPSULE | Freq: Every morning | ORAL | Status: DC

## 2013-07-02 MED ORDER — MONTELUKAST SODIUM 10 MG PO TABS
10.0000 mg | ORAL_TABLET | Freq: Every day | ORAL | Status: DC
  Administered 2013-07-02 – 2013-07-03 (×2): 10 mg via ORAL
  Filled 2013-07-02 (×4): qty 1

## 2013-07-02 MED ORDER — PANTOPRAZOLE SODIUM 40 MG PO TBEC
40.0000 mg | DELAYED_RELEASE_TABLET | Freq: Every day | ORAL | Status: DC
  Administered 2013-07-02 – 2013-07-04 (×3): 40 mg via ORAL
  Filled 2013-07-02 (×3): qty 1

## 2013-07-02 MED ORDER — SODIUM CHLORIDE 0.9 % IV SOLN: INTRAVENOUS | Status: AC

## 2013-07-02 MED ORDER — ACETAMINOPHEN 650 MG RE SUPP: 650.0000 mg | Freq: Four times a day (QID) | RECTAL | Status: DC | PRN

## 2013-07-02 MED ORDER — LEVOTHYROXINE SODIUM 100 MCG PO TABS
100.0000 ug | ORAL_TABLET | Freq: Every day | ORAL | Status: DC
  Administered 2013-07-02 – 2013-07-04 (×3): 100 ug via ORAL
  Filled 2013-07-02 (×4): qty 1

## 2013-07-02 MED ORDER — SIMVASTATIN 20 MG PO TABS
20.0000 mg | ORAL_TABLET | Freq: Every day | ORAL | Status: DC
  Filled 2013-07-02: qty 1

## 2013-07-02 MED ORDER — OMEGA-3-ACID ETHYL ESTERS 1 G PO CAPS
1.0000 g | ORAL_CAPSULE | Freq: Every evening | ORAL | Status: DC
  Administered 2013-07-02 – 2013-07-03 (×2): 1 g via ORAL
  Filled 2013-07-02 (×3): qty 1

## 2013-07-02 MED ORDER — SODIUM CHLORIDE 0.9 % IV SOLN
INTRAVENOUS | Status: AC
  Administered 2013-07-02: 03:00:00 via INTRAVENOUS

## 2013-07-02 MED ORDER — HEPARIN (PORCINE) IN NACL 100-0.45 UNIT/ML-% IJ SOLN
1050.0000 [IU]/h | INTRAMUSCULAR | Status: DC
  Administered 2013-07-02: 1000 [IU]/h via INTRAVENOUS
  Administered 2013-07-02: 800 [IU]/h via INTRAVENOUS
  Administered 2013-07-03: 1000 [IU]/h via INTRAVENOUS
  Administered 2013-07-04: 1050 [IU]/h via INTRAVENOUS
  Filled 2013-07-02 (×5): qty 250

## 2013-07-02 MED ORDER — SODIUM CHLORIDE 0.9 % IV SOLN
INTRAVENOUS | Status: DC
  Administered 2013-07-03: 75 mL via INTRAVENOUS

## 2013-07-02 MED ORDER — CEFTRIAXONE SODIUM 1 G IJ SOLR
1.0000 g | Freq: Once | INTRAMUSCULAR | Status: AC
  Administered 2013-07-02: 1 g via INTRAVENOUS
  Filled 2013-07-02: qty 10

## 2013-07-02 MED ORDER — ATORVASTATIN CALCIUM 10 MG PO TABS
10.0000 mg | ORAL_TABLET | Freq: Every day | ORAL | Status: DC
  Administered 2013-07-02 – 2013-07-03 (×2): 10 mg via ORAL
  Filled 2013-07-02 (×3): qty 1

## 2013-07-02 MED ORDER — ALUM & MAG HYDROXIDE-SIMETH 200-200-20 MG/5ML PO SUSP: 30.0000 mL | Freq: Four times a day (QID) | ORAL | Status: DC | PRN

## 2013-07-02 NOTE — Progress Notes (Addendum)
Patient seen and examined this morning. Agree with H&P. Urine culture sent this morning. Patient with history of pre-syncopal episodes last year, intermittent tachycardia with prior consideration for event monitor. Cardiology consulted as well, appreciate input. 2D echo pending. Patient has 2 charts, the other one MRN: [id number]   Jesper Stirewalt M. Cruzita Lederer, MD [facility] [phone]

## 2013-07-02 NOTE — ED Notes (Signed)
Iv and pain med givne

## 2013-07-02 NOTE — ED Notes (Signed)
The pt returned from  Xray tylenol given
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
## 2013-07-02 NOTE — ED Notes (Signed)
The recephin iv was not started at the time recorded  Blood cultures were ordered after the med was scanned and it was not started.  The end documented was not accuarte.  The rocephin was never started  Error

## 2013-07-02 NOTE — ED Notes (Signed)
Dr jenkins at the bedside 
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
## 2013-07-02 NOTE — Progress Notes (Signed)
Pt noted to have converted to SR with 1st deg HB on telemetry. [place], [facility]
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
## 2013-07-02 NOTE — ED Provider Notes (Signed)
CSN: [id number]     Arrival date & time 07/01/13  2257 History   First MD Initiated Contact with Patient 07/01/13 2328     Chief Complaint  Patient presents with  . 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 Patient has episodes of should intermittent sharp pain, random timing. Patient has had significant workup per her report including MRI for back and pelvis with nerve impingement. Today she developed low-grade fever however has not had signs of infection at the right hip area. She has had a mild cough. No recent hospitalizations or sick contacts. The pain comes in very brief episodes and radiates from the right hip.  Patient is a 80 y.o. female presenting with leg pain. The history is provided by the patient.  Leg Pain Associated symptoms: fever   Associated symptoms: no back pain and no neck pain     No past medical history on file. Past Surgical History  Procedure Laterality Date  . Above knee leg amputation Right    No family history on file. History  Substance Use Topics  . Smoking status: Not on file  . Smokeless tobacco: Not on file  . Alcohol Use: Not on file   OB History   Grav Para Term Preterm Abortions TAB SAB Ect Mult Living                 Review of Systems  Constitutional: Positive for fever. Negative for chills.  HENT: Negative for congestion.   Eyes: Negative for visual disturbance.  Respiratory: Positive for cough. Negative for shortness of breath.   Cardiovascular: Negative for chest pain.  Gastrointestinal: Negative for vomiting and abdominal pain.  Genitourinary: Negative for dysuria and flank pain.  Musculoskeletal: Positive for arthralgias. Negative for back pain, neck pain and neck stiffness.  Skin: Negative  for rash.  Neurological: Negative for light-headedness and headaches.      Allergies  Sulfa antibiotics  Home Medications   Current Outpatient Rx  Name  Route  Sig  Dispense  Refill  . ALPRAZolam (XANAX PO)   Oral   Take 1 tablet by mouth once. Not patient's prescribtion         . amLODipine (NORVASC) 10 MG tablet   Oral   Take 10 mg by mouth daily.         Marland Kitchen BIOTIN PO   Oral   Take 2 tablets by mouth daily.         . Calcium Carbonate-Vitamin D (CALCIUM + D PO)   Oral   Take 1 tablet by mouth every evening.         . citalopram (CELEXA) 20 MG tablet   Oral   Take 10 mg by mouth at bedtime.         . Coenzyme Q10 (COQ10 PO)   Oral   Take 150 mg by mouth every evening.         . conjugated estrogens (PREMARIN) vaginal cream   Vaginal   Place 1 Applicatorful vaginally See admin instructions. 1-2 times per week at bedtime         . estradiol (ESTRACE) 0.5 MG tablet   Oral   Take 0.5 mg by mouth every evening.         . irbesartan (AVAPRO) 300 MG tablet   Oral   Take 300 mg  by mouth every evening.         Marland Kitchen levothyroxine (SYNTHROID, LEVOTHROID) 100 MCG tablet   Oral   Take 100 mcg by mouth daily before breakfast.         . LORazepam (ATIVAN) 0.5 MG tablet   Oral   Take 0.5 mg by mouth every 8 (eight) hours. For anxiety         . meloxicam (MOBIC) 15 MG tablet   Oral   Take 15 mg by mouth every evening.         . metoprolol (LOPRESSOR) 50 MG tablet   Oral   Take 50 mg by mouth 2 (two) times daily.         . montelukast (SINGULAIR) 10 MG tablet   Oral   Take 10 mg by mouth daily.         . Multiple Vitamin (MULTIVITAMIN WITH MINERALS) TABS tablet   Oral   Take 1 tablet by mouth daily.         Marland Kitchen omega-3 acid ethyl esters (LOVAZA) 1 G capsule   Oral   Take 1 g by mouth every evening.         Marland Kitchen omeprazole (PRILOSEC) 40 MG capsule   Oral   Take 40 mg by mouth daily.         . Probiotic Product (PHILLIPS COLON  HEALTH PO)   Oral   Take 1 tablet by mouth daily.         . simvastatin (ZOCOR) 20 MG tablet   Oral   Take 20 mg by mouth at bedtime.         . torsemide (DEMADEX) 20 MG tablet   Oral   Take 20 mg by mouth daily.         . traMADol (ULTRAM) 50 MG tablet   Oral   Take 50 mg by mouth every 6 (six) hours as needed for moderate pain.          BP 117/53  Pulse 144  Temp(Src) 100.3 F (37.9 C) (Oral)  Resp 14  Ht 5\' 4"  (1.626 m)  Wt 125 lb (56.7 kg)  BMI 21.45 kg/m2  SpO2 94% Physical Exam  Nursing note and vitals reviewed. Constitutional: She is oriented to person, place, and time. She appears well-developed and well-nourished.  HENT:  Head: Normocephalic and atraumatic.  Mild dry mm  Eyes: Conjunctivae are normal. Right eye exhibits no discharge. Left eye exhibits no discharge.  Neck: Normal range of motion. Neck supple. No tracheal deviation present.  Cardiovascular: Regular rhythm.  Tachycardia present.   Pulmonary/Chest: Effort normal. She has rales (few bilateral right base).  Abdominal: Soft. She exhibits no distension. There is no tenderness. There is no guarding.  Musculoskeletal: She exhibits no edema and no tenderness.  Right hip stump, no signs of infection, no erythema. Tenderness, abscess or streaking Left leg warm to palpation, no signs of infection, scarring from pervious surgeries.  Lymphadenopathy:    She has no cervical adenopathy.  Neurological: She is alert and oriented to person, place, and time.  CNs intact, no meningismus AO4  Skin: Skin is warm. No rash noted.  Psychiatric: She has a normal mood and affect.    ED Course  Procedures (including critical care time) CRITICAL CARE Performed by: Mariea Clonts   Total critical care time: 35 min  Critical care time was exclusive of separately billable procedures and treating other patients.  Critical care was necessary to treat or prevent  imminent or life-threatening  deterioration.  Critical care was time spent personally by me on the following activities: development of treatment plan with patient and/or surrogate as well as nursing, discussions with consultants, evaluation of patient's response to treatment, examination of patient, obtaining history from patient or surrogate, ordering and performing treatments and interventions, ordering and review of laboratory studies, ordering and review of radiographic studies, pulse oximetry and re-evaluation of patient's condition.  Labs Review Labs Reviewed  CBC WITH DIFFERENTIAL - Abnormal; Notable for the following:    WBC 14.6 (*)    Neutrophils Relative % 85 (*)    Neutro Abs 12.5 (*)    Lymphocytes Relative 7 (*)    Monocytes Absolute 1.1 (*)    All other components within normal limits  BASIC METABOLIC PANEL - Abnormal; Notable for the following:    Sodium 135 (*)    Chloride 95 (*)    Glucose, Bld 158 (*)    GFR calc non Af Amer 77 (*)    GFR calc Af Amer 90 (*)    All other components within normal limits  URINALYSIS, ROUTINE W REFLEX MICROSCOPIC - Abnormal; Notable for the following:    APPearance CLOUDY (*)    Hgb urine dipstick SMALL (*)    Nitrite POSITIVE (*)    Leukocytes, UA SMALL (*)    All other components within normal limits  URINE MICROSCOPIC-ADD ON - Abnormal; Notable for the following:    Squamous Epithelial / LPF FEW (*)    Bacteria, UA MANY (*)    All other components within normal limits  CULTURE, BLOOD (ROUTINE X 2)  CULTURE, BLOOD (ROUTINE X 2)  I-STAT CG4 LACTIC ACID, ED   Imaging Review Dg Chest 2 View  07/02/2013   CLINICAL DATA:  Leg pain.  EXAM: CHEST  2 VIEW  COMPARISON:  None available for comparison at time of study interpretation.  FINDINGS: The cardiac silhouette is upper limits of normal in size, mediastinal silhouette is nonsuspicious. Slight prominence of the bronchovascular markings are likely chronic without pleural effusions or focal consolidations. No  pneumothorax. Moderate mid thoracic degenerative change. Remote left lateral rib fractures.Soft tissue planes are nonsuspicious.  IMPRESSION: Borderline cardiomegaly, no acute pulmonary process.   Electronically Signed   By: Elon Alas   On: 07/02/2013 02:03   Dg Pelvis 1-2 Views  07/02/2013   CLINICAL DATA:  LEG PAIN.  EXAM: PELVIS - 1-2 VIEW  COMPARISON:  None available for comparison at time of study interpretation.  FINDINGS: No acute fracture deformity. Amputation of the right femur with deformity of the right acetabulum which is shallow, chronic in appearance. Included view of the left leg demonstrates intra medullary rod, and surgical clips within the soft tissues. Bone mineral density is decreased. Lucency within the right iliac wing may reflect donor site/ postoperative change. Surgical clips in the pelvis.  IMPRESSION: Right hip amputation/disarticulation, No acute fracture deformity or dislocation. Osteopenia may decrease sensitivity for acute nondisplaced fractures.   Electronically Signed   By: Elon Alas   On: 07/02/2013 02:02     EKG Interpretation None    not in muse  Date: 07/02/2013  Rate: 132  Rhythm: atrial fibrillation  QRS Axis: normal  Intervals: normal  ST/T Wave abnormalities: ST depressions inferiorly and ST depressions laterally  Conduction Disutrbances:a fib  Narrative Interpretation:   Old EKG Reviewed: changes noted    MDM   Final diagnoses:  UTI (lower urinary tract infection)  Sepsis  Atrial fibrillation with  RVR  Right hip pain   Patient with fever, tachycardia and right hip pain. Clinical concern for sepsis further evaluation for source. Urine is grossly infected, culture and Rocephin ordered. Fluid bolus given ED. Heart rate did not improve EKG performed and revealed atrial fibrillation with rapid ventricular response. Patient has possible brief history of atrial fibrillation but is not on medicines at this time. Cardizem drip ordered  with small bolus and titration. On recheck patient feels mild improvement. I spoke with Dr. Arnoldo Morale for triads is a 6 patient for stepped-down.  HR improved in the ED.  Recheck, no acute changes prior to admission.   The patients results and plan were reviewed and discussed.   Any x-rays performed were personally reviewed by myself.   Differential diagnosis were considered with the presenting HPI.   EKG: reviewed  Filed Vitals:   07/02/13 0530 07/02/13 0545 07/02/13 0600 07/02/13 0630  BP: 113/54 103/50 105/44 104/41  Pulse: 101 99 101 99  Temp:      TempSrc:      Resp: 18 15 16 17   Height:      Weight:      SpO2: 99% 96% 96% 99%    Admission/ observation were discussed with the admitting physician, patient and/or family and they are comfortable with the plan.     Mariea Clonts, MD 07/02/13 [phone]

## 2013-07-02 NOTE — ED Notes (Signed)
Rocephin started now

## 2013-07-02 NOTE — ED Notes (Signed)
Pt to xray

## 2013-07-02 NOTE — ED Notes (Signed)
Waiting on blood cultures to start rocephin

## 2013-07-02 NOTE — Progress Notes (Signed)
In to assist pt with bedpan.  Upon taking pt off bedpan, pt stated she felt "like she was going to pass out." Pt noted to have HR change from NSR with 1st deg HB at 86 to rate of 39.  Pt retained LOC, instructed to cough.  HR increased to 45-55 but in an irregular rhythm.  BP 87/56. MD notified.  Will continue to closely monitor. Kenyona Rena, [facility]

## 2013-07-02 NOTE — ED Notes (Signed)
diltizem started

## 2013-07-02 NOTE — ED Notes (Signed)
recephin delayed start waiting for  Blood cultures to be done

## 2013-07-02 NOTE — Progress Notes (Addendum)
ANTICOAGULATION CONSULT NOTE - Follow Up Consult  Pharmacy Consult for heparin Indication: atrial fibrillation  Labs:  Recent Labs  07/02/13 0047 07/02/13 0530 07/02/13 1246 07/02/13 2215  HGB 13.6 12.5  --   --   HCT 39.2 35.8*  --   --   PLT 209 207  --   --   HEPARINUNFRC  --   --  0.28* 0.24*  CREATININE 0.70 0.77  --   --     Assessment: 80yo female now with lower heparin level despite rate increase earlier, initial level likely resultant of initial bolus.  Goal of Therapy:  Heparin level 0.3-0.7 units/ml   Plan:  Will increase heparin gtt by 2-3 units/kg/hr to 1000 units/hr and check level with am labs.  Wynona Neat, PharmD, BCPS  07/02/2013,11:16 PM    Addendum:  AM labs drawn just 3hr after rate adjustment, level now 0.29.  Will continue as expect level to rise and recheck 8hr after rate change.   VB 07/03/2013 4:04 AM

## 2013-07-03 DIAGNOSIS — I4891 Unspecified atrial fibrillation: Secondary | ICD-10-CM | POA: Diagnosis not present

## 2013-07-03 DIAGNOSIS — I10 Essential (primary) hypertension: Secondary | ICD-10-CM | POA: Diagnosis present

## 2013-07-03 DIAGNOSIS — N390 Urinary tract infection, site not specified: Secondary | ICD-10-CM | POA: Diagnosis not present

## 2013-07-03 DIAGNOSIS — I059 Rheumatic mitral valve disease, unspecified: Secondary | ICD-10-CM | POA: Diagnosis not present

## 2013-07-03 DIAGNOSIS — G547 Phantom limb syndrome without pain: Secondary | ICD-10-CM | POA: Diagnosis not present

## 2013-07-03 LAB — CBC
HCT: 32.9 % — ABNORMAL LOW (ref 36.0–46.0)
HEMOGLOBIN: 11.3 g/dL — AB (ref 12.0–15.0)
MCH: 31.5 pg (ref 26.0–34.0)
MCHC: 34.3 g/dL (ref 30.0–36.0)
MCV: 91.6 fL (ref 78.0–100.0)
Platelets: 188 10*3/uL (ref 150–400)
RBC: 3.59 MIL/uL — AB (ref 3.87–5.11)
RDW: 13.7 % (ref 11.5–15.5)
WBC: 12.8 10*3/uL — AB (ref 4.0–10.5)

## 2013-07-03 LAB — BASIC METABOLIC PANEL
BUN: 24 mg/dL — ABNORMAL HIGH (ref 6–23)
CHLORIDE: 101 meq/L (ref 96–112)
CO2: 22 meq/L (ref 19–32)
Calcium: 8.1 mg/dL — ABNORMAL LOW (ref 8.4–10.5)
Creatinine, Ser: 0.77 mg/dL (ref 0.50–1.10)
GFR calc Af Amer: 87 mL/min — ABNORMAL LOW (ref >90)
GFR calc non Af Amer: 75 mL/min — ABNORMAL LOW (ref >90)
GLUCOSE: 137 mg/dL — AB (ref 70–99)
POTASSIUM: 4.4 meq/L (ref 3.7–5.3)
SODIUM: 136 meq/L — AB (ref 137–147)

## 2013-07-03 LAB — HEPARIN LEVEL (UNFRACTIONATED)
HEPARIN UNFRACTIONATED: 0.29 [IU]/mL — AB (ref 0.30–0.70)
Heparin Unfractionated: 0.35 IU/mL (ref 0.30–0.70)

## 2013-07-03 NOTE — Progress Notes (Signed)
SUBJECTIVE:  One episode of bradycardia with using the bed pan.  Overall , she feels well.  She did not feel her AFib either. Converted spontaneously.  OBJECTIVE:   Vitals:   Filed Vitals:   07/02/13 2324 07/03/13 0000 07/03/13 0337 07/03/13 0745  BP: 123/39  154/40 148/45  Pulse: 83 77 73 77  Temp: 100.1 F (37.8 C)  97.9 F (36.6 C) 99.3 F (37.4 C)  TempSrc: Oral  Oral Oral  Resp: 19 20 19    Height:      Weight:      SpO2: 96% 90% 97% 99%   I&O's:   Intake/Output Summary (Last 24 hours) at 07/03/13 1109 Last data filed at 07/03/13 0800  Gross per 24 hour  Intake 2072.56 ml  Output   1600 ml  Net 472.56 ml   TELEMETRY: Reviewed telemetry pt in NSR, PACs:     PHYSICAL EXAM General: Well developed, well nourished, in no acute distress Head:   Normal cephalic and atramatic  Lungs:   Clear bilaterally to auscultation and percussion. Heart:   HRRR S1 S2, premature beats No JVD.  No abdominal bruits. No femoral bruits. Abdomen: , abdomen soft and non-tender Msk:  Back normal. Normal strength and tone for age. Extremities:  No edema.  Right leg amputation Neuro: Alert and oriented X 3. Psych:  Good affect, responds appropriately   LABS: Basic Metabolic Panel:  Recent Labs  07/02/13 0530 07/03/13 0240  NA 136* 136*  K 3.5* 4.4  CL 98 101  CO2 23 22  GLUCOSE 155* 137*  BUN 20 24*  CREATININE 0.77 0.77  CALCIUM 8.3* 8.1*   Liver Function Tests: No results found for this basename: AST, ALT, ALKPHOS, BILITOT, PROT, ALBUMIN,  in the last 72 hours No results found for this basename: LIPASE, AMYLASE,  in the last 72 hours CBC:  Recent Labs  07/02/13 0047 07/02/13 0530 07/03/13 0240  WBC 14.6* 14.9* 12.8*  NEUTROABS 12.5*  --   --   HGB 13.6 12.5 11.3*  HCT 39.2 35.8* 32.9*  MCV 90.3 90.4 91.6  PLT 209 207 188   Cardiac Enzymes: No results found for this basename: CKTOTAL, CKMB, CKMBINDEX, TROPONINI,  in the last 72 hours BNP: No components found with  this basename: POCBNP,  D-Dimer: No results found for this basename: DDIMER,  in the last 72 hours Hemoglobin A1C: No results found for this basename: HGBA1C,  in the last 72 hours Fasting Lipid Panel: No results found for this basename: CHOL, HDL, LDLCALC, TRIG, CHOLHDL, LDLDIRECT,  in the last 72 hours Thyroid Function Tests: No results found for this basename: TSH, T4TOTAL, FREET3, T3FREE, THYROIDAB,  in the last 72 hours Anemia Panel: No results found for this basename: VITAMINB12, FOLATE, FERRITIN, TIBC, IRON, RETICCTPCT,  in the last 72 hours Coag Panel:   No results found for this basename: INR, PROTIME    RADIOLOGY: Dg Chest 2 View  07/02/2013   CLINICAL DATA:  Leg pain.  EXAM: CHEST  2 VIEW  COMPARISON:  None available for comparison at time of study interpretation.  FINDINGS: The cardiac silhouette is upper limits of normal in size, mediastinal silhouette is nonsuspicious. Slight prominence of the bronchovascular markings are likely chronic without pleural effusions or focal consolidations. No pneumothorax. Moderate mid thoracic degenerative change. Remote left lateral rib fractures.Soft tissue planes are nonsuspicious.  IMPRESSION: Borderline cardiomegaly, no acute pulmonary process.   Electronically Signed   By: Elon Alas   On: 07/02/2013 02:03  Dg Pelvis 1-2 Views  07/02/2013   CLINICAL DATA:  LEG PAIN.  EXAM: PELVIS - 1-2 VIEW  COMPARISON:  None available for comparison at time of study interpretation.  FINDINGS: No acute fracture deformity. Amputation of the right femur with deformity of the right acetabulum which is shallow, chronic in appearance. Included view of the left leg demonstrates intra medullary rod, and surgical clips within the soft tissues. Bone mineral density is decreased. Lucency within the right iliac wing may reflect donor site/ postoperative change. Surgical clips in the pelvis.  IMPRESSION: Right hip amputation/disarticulation, No acute fracture  deformity or dislocation. Osteopenia may decrease sensitivity for acute nondisplaced fractures.   Electronically Signed   By: Elon Alas   On: 07/02/2013 02:02      ASSESSMENT: PAF in the setting of UTI and phantom leg pain  PLAN:  AFib: We discussed anticoagualtion.  She does not want to take Coumadin. Plan for eliquis 2.5 mg BID at the time of discharge along with her metoprolol, based on weight and age.  All questions answered.  OK to transfer to tele from a cardiac standpoint.  HTN: Avapro on hold.  May need to restart at a lower dose prior to d/c.  Would restart avapro be fore norvasc due to secondary benefits of ARB.  Jettie Booze., MD  07/03/2013  11:09 AM

## 2013-07-03 NOTE — Progress Notes (Signed)
ANTICOAGULATION CONSULT NOTE - Follow Up  Pharmacy Consult for heparin gtt Indication: atrial fibrillation  Allergies  Allergen Reactions  . Sulfa Antibiotics Other (See Comments)    Severe diarrhea   Patient Measurements: Height: 5\' 3"  (160 cm) Weight: 129 lb 3 oz (58.6 kg) IBW/kg (Calculated) : 52.4 Heparin Dosing Weight: 58.6 kg (actual body weight)  Vital Signs: Temp: 98.4 F (36.9 C) (03/30 2037) Temp src: Oral (03/30 2037) BP: 157/54 mmHg (03/30 2037) Pulse Rate: 78 (03/30 2037)  Labs:  Recent Labs  07/02/13 0047 07/02/13 0530  07/03/13 0240 07/03/13 0746 07/03/13 2028  HGB 13.6 12.5  --  11.3*  --   --   HCT 39.2 35.8*  --  32.9*  --   --   PLT 209 207  --  188  --   --   HEPARINUNFRC  --   --   < > 0.29* 0.35 <0.10*  CREATININE 0.70 0.77  --  0.77  --   --   < > = values in this interval not displayed.  Estimated Creatinine Clearance: 43.3 ml/min (by C-G formula based on Cr of 0.77).   Assessment: 80 yo F presented to ED with c/o worsening phantom leg pain, but found with UTI and to be in afib w/ RVR. Patient has possible brief h/o afib, but was not being treated for it PTA.  Pharmacy was consulted to begin heparin gtt. Heparin infusion currently running at 1050 units/hr and HL this PM was sub-therapeutic at <0.1. Her heparin infusion had been paused for some amount of time and I suspect this is why her level was low.  Restarted at 21:00PM, will just recheck level with AM labs to make sure she is within goal.   Goal of Therapy:  Heparin level 0.3-0.7 units/ml Monitor platelets by anticoagulation protocol: Yes   Plan:  - Continue IV heparin infusion at 1050 units/hr.    - daily HL and CBC - monitor for s/s of bleeding  Rober Minion, PharmD., MS Clinical Pharmacist Pager:  [phone] Thank you for allowing pharmacy to be part of this patients care team.

## 2013-07-03 NOTE — Progress Notes (Signed)
  Echocardiogram 2D Echocardiogram has been performed.  Kelsey Joseph 07/03/2013, 1:01 PM

## 2013-07-03 NOTE — Progress Notes (Signed)
PROGRESS NOTE  Kelsey Joseph RDE:[id number] DOB: Aug 17, 1927 DOA: 07/01/2013 PCP: Ruben Reason, MD  Assessment/Plan: A fib with RVR - converted to sinus rhythm yesterday. Brief episode of symptomatic bradycardia. Appreciate cards input, ?NOAC today. 2d echo pending still.  UTI - preliminary with E coli, awaiting sensitivities.  Hypothyroidism - continue synthroid HLD - statin Phantom limb pain - no pain today.   Diet: heart healthy Fluids: none DVT Prophylaxis: heparin  Code Status: FUll Family Communication: d/w patient  Disposition Plan: transfer telemetry  Consultants:  Cardiology   Procedures:  none   Antibiotics  Anti-infectives   Start     Dose/Rate Route Frequency Ordered Stop   07/03/13 0300  cefTRIAXone (ROCEPHIN) 1 g in dextrose 5 % 50 mL IVPB     1 g 100 mL/hr over 30 Minutes Intravenous Every 24 hours 07/02/13 0403     07/02/13 0115  cefTRIAXone (ROCEPHIN) 1 g in dextrose 5 % 50 mL IVPB     1 g 100 mL/hr over 30 Minutes Intravenous  Once 07/02/13 0101 07/02/13 0255     Antibiotics Given (last 72 hours)   Date/Time Action Medication Dose Rate   07/03/13 0337 Given   cefTRIAXone (ROCEPHIN) 1 g in dextrose 5 % 50 mL IVPB 1 g 100 mL/hr      HPI/Subjective: - feeling better, no pain, no palpitations, denies chest pain.  Objective: Filed Vitals:   07/02/13 2324 07/03/13 0000 07/03/13 0337 07/03/13 0745  BP: 123/39  154/40 148/45  Pulse: 83 77 73 77  Temp: 100.1 F (37.8 C)  97.9 F (36.6 C) 99.3 F (37.4 C)  TempSrc: Oral  Oral Oral  Resp: 19 20 19    Height:      Weight:      SpO2: 96% 90% 97% 99%    Intake/Output Summary (Last 24 hours) at 07/03/13 1110 Last data filed at 07/03/13 0800  Gross per 24 hour  Intake 2072.56 ml  Output   1600 ml  Net 472.56 ml   Filed Weights   07/01/13 2308 07/02/13 0500  Weight: 56.7 kg (125 lb) 58.6 kg (129 lb 3 oz)   Exam:  General:  NAD  Cardiovascular: regular rate and rhythm  Respiratory: good  air movement, clear to auscultation throughout, no wheezing, ronchi or rales  Abdomen: soft, not tender to palpation, positive bowel sounds  MSK: no peripheral edema  Neuro: non focal  Data Reviewed: Basic Metabolic Panel:  Recent Labs Lab 07/02/13 0047 07/02/13 0530 07/03/13 0240  NA 135* 136* 136*  K 3.9 3.5* 4.4  CL 95* 98 101  CO2 22 23 22   GLUCOSE 158* 155* 137*  BUN 22 20 24*  CREATININE 0.70 0.77 0.77  CALCIUM 8.9 8.3* 8.1*   CBC:  Recent Labs Lab 07/02/13 0047 07/02/13 0530 07/03/13 0240  WBC 14.6* 14.9* 12.8*  NEUTROABS 12.5*  --   --   HGB 13.6 12.5 11.3*  HCT 39.2 35.8* 32.9*  MCV 90.3 90.4 91.6  PLT 209 207 188   Recent Results (from the past 240 hour(s))  URINE CULTURE     Status: None   Collection Time    07/02/13 12:40 AM      Result Value Ref Range Status   Specimen Description URINE, RANDOM   Final   Special Requests NONE   Final   Culture  Setup Time     Final   Value: 07/02/2013 14:10     Performed at [facility]  Final   Value: >=100,000 COLONIES/ML     Performed at [facility]   Culture     Final   Value: ESCHERICHIA COLI     Performed at [facility]   Report Status PENDING   Incomplete  CULTURE, BLOOD (ROUTINE X 2)     Status: None   Collection Time    07/02/13  2:30 AM      Result Value Ref Range Status   Specimen Description BLOOD LEFT ARM   Final   Special Requests     Final   Value: BOTTLES DRAWN AEROBIC AND ANAEROBIC 10CC AEROBIC [place] ANAEROBIC   Culture  Setup Time     Final   Value: 07/02/2013 12:34     Performed at [facility]   Culture     Final   Value:        BLOOD CULTURE RECEIVED NO GROWTH TO DATE CULTURE WILL BE HELD FOR 5 DAYS BEFORE ISSUING A FINAL NEGATIVE REPORT     Performed at [facility]   Report Status PENDING   Incomplete  CULTURE, BLOOD (ROUTINE X 2)     Status: None   Collection Time    07/02/13  2:37 AM      Result Value Ref Range  Status   Specimen Description BLOOD LEFT FOREARM   Final   Special Requests BOTTLES DRAWN AEROBIC ONLY 4CC   Final   Culture  Setup Time     Final   Value: 07/02/2013 12:34     Performed at [facility]   Culture     Final   Value:        BLOOD CULTURE RECEIVED NO GROWTH TO DATE CULTURE WILL BE HELD FOR 5 DAYS BEFORE ISSUING A FINAL NEGATIVE REPORT     Performed at [facility]   Report Status PENDING   Incomplete  MRSA PCR SCREENING     Status: None   Collection Time    07/02/13  4:51 AM      Result Value Ref Range Status   MRSA by PCR NEGATIVE  NEGATIVE Final   Comment:            The GeneXpert MRSA Assay (FDA     approved for NASAL specimens     only), is one component of a     comprehensive MRSA colonization     surveillance program. It is not     intended to diagnose MRSA     infection nor to guide or     monitor treatment for     MRSA infections.     Studies: Dg Chest 2 View  07/02/2013   CLINICAL DATA:  Leg pain.  EXAM: CHEST  2 VIEW  COMPARISON:  None available for comparison at time of study interpretation.  FINDINGS: The cardiac silhouette is upper limits of normal in size, mediastinal silhouette is nonsuspicious. Slight prominence of the bronchovascular markings are likely chronic without pleural effusions or focal consolidations. No pneumothorax. Moderate mid thoracic degenerative change. Remote left lateral rib fractures.Soft tissue planes are nonsuspicious.  IMPRESSION: Borderline cardiomegaly, no acute pulmonary process.   Electronically Signed   By: Elon Alas   On: 07/02/2013 02:03   Dg Pelvis 1-2 Views  07/02/2013   CLINICAL DATA:  LEG PAIN.  EXAM: PELVIS - 1-2 VIEW  COMPARISON:  None available for comparison at time of study interpretation.  FINDINGS: No acute fracture deformity. Amputation of the right femur with deformity  of the right acetabulum which is shallow, chronic in appearance. Included view of the left leg demonstrates intra  medullary rod, and surgical clips within the soft tissues. Bone mineral density is decreased. Lucency within the right iliac wing may reflect donor site/ postoperative change. Surgical clips in the pelvis.  IMPRESSION: Right hip amputation/disarticulation, No acute fracture deformity or dislocation. Osteopenia may decrease sensitivity for acute nondisplaced fractures.   Electronically Signed   By: Elon Alas   On: 07/02/2013 02:02    Scheduled Meds: . atorvastatin  10 mg Oral q1800  . calcium-vitamin D  1 tablet Oral Q supper  . cefTRIAXone (ROCEPHIN)  IV  1 g Intravenous Q24H  . citalopram  10 mg Oral QHS  . estradiol  0.5 mg Oral Daily  . levothyroxine  100 mcg Oral QAC breakfast  . LORazepam  0.5 mg Oral 3 times per day  . meloxicam  15 mg Oral QPM  . metoprolol  50 mg Oral BID  . montelukast  10 mg Oral QHS  . multivitamin with minerals  1 tablet Oral Daily  . omega-3 acid ethyl esters  1 g Oral QPM  . pantoprazole  40 mg Oral Daily   Continuous Infusions: . sodium chloride 75 mL (07/03/13 0602)  . diltiazem (CARDIZEM) infusion Stopped (07/02/13 1421)  . heparin 1,050 Units/hr (07/03/13 0846)    Principal Problem:   Atrial fibrillation with RVR Active Problems:   UTI (lower urinary tract infection)   Hypotension   Hypothyroid   Hyperlipidemia   Phantom limb pain  Time spent: 25  This note has been created with Surveyor, quantity. Any transcriptional errors are unintentional.   Marzetta Board, MD [facility] Pager [phone]. If 7 PM - 7 AM, please contact night-coverage at [email or web address], password [facility] 07/03/2013, 11:10 AM  LOS: 2 days

## 2013-07-03 NOTE — Progress Notes (Signed)
ANTICOAGULATION CONSULT NOTE - Follow Up  Pharmacy Consult for heparin gtt Indication: atrial fibrillation  Allergies  Allergen Reactions  . Sulfa Antibiotics Other (See Comments)    Severe diarrhea    Patient Measurements: Height: 5\' 3"  (160 cm) Weight: 129 lb 3 oz (58.6 kg) IBW/kg (Calculated) : 52.4 Heparin Dosing Weight: 58.6 kg (actual body weight)  Vital Signs: Temp: 99.3 F (37.4 C) (03/30 0745) Temp src: Oral (03/30 0745) BP: 148/45 mmHg (03/30 0745) Pulse Rate: 77 (03/30 0745)  Labs:  Recent Labs  07/02/13 0047 07/02/13 0530  07/02/13 2215 07/03/13 0240 07/03/13 0746  HGB 13.6 12.5  --   --  11.3*  --   HCT 39.2 35.8*  --   --  32.9*  --   PLT 209 207  --   --  188  --   HEPARINUNFRC  --   --   < > 0.24* 0.29* 0.35  CREATININE 0.70 0.77  --   --  0.77  --   < > = values in this interval not displayed.  Estimated Creatinine Clearance: 43.3 ml/min (by C-G formula based on Cr of 0.77).   Assessment: 80 yo F presented to ED with c/o worsening phantom leg pain, but found with UTI and to be in afib w/ RVR. Patient has possible brief h/o afib, but was not being treated for it PTA.  Pharmacy was consulted to begin heparin gtt. Heparin infusion currently running at 1000 units/hr and HL this AM was therapeutic at 0.35. Hgb slight trend down to 11.3 today. Plt wnl. Patient reports no s/s of bleeding.   Goal of Therapy:  Heparin level 0.3-0.7 units/ml Monitor platelets by anticoagulation protocol: Yes   Plan:  - Increase heparin infusion to 1050 since HL on lower end of goal range  - F/u 8-hr HL at 1730  - daily HL and CBC - monitor for s/s of bleeding  Albertina Parr, PharmD.  Clinical Pharmacist Pager [phone]

## 2013-07-04 ENCOUNTER — Other Ambulatory Visit: Payer: Self-pay | Admitting: Internal Medicine

## 2013-07-04 DIAGNOSIS — A419 Sepsis, unspecified organism: Secondary | ICD-10-CM | POA: Diagnosis present

## 2013-07-04 DIAGNOSIS — I4891 Unspecified atrial fibrillation: Secondary | ICD-10-CM | POA: Diagnosis not present

## 2013-07-04 DIAGNOSIS — N390 Urinary tract infection, site not specified: Secondary | ICD-10-CM | POA: Diagnosis not present

## 2013-07-04 LAB — CBC
HCT: 33.2 % — ABNORMAL LOW (ref 36.0–46.0)
Hemoglobin: 11.4 g/dL — ABNORMAL LOW (ref 12.0–15.0)
MCH: 31.4 pg (ref 26.0–34.0)
MCHC: 34.3 g/dL (ref 30.0–36.0)
MCV: 91.5 fL (ref 78.0–100.0)
PLATELETS: 214 10*3/uL (ref 150–400)
RBC: 3.63 MIL/uL — AB (ref 3.87–5.11)
RDW: 13.5 % (ref 11.5–15.5)
WBC: 10.4 10*3/uL (ref 4.0–10.5)

## 2013-07-04 LAB — URINE CULTURE: Colony Count: >=100000

## 2013-07-04 LAB — BASIC METABOLIC PANEL
BUN: 12 mg/dL (ref 6–23)
CALCIUM: 8.4 mg/dL (ref 8.4–10.5)
CHLORIDE: 103 meq/L (ref 96–112)
CO2: 24 meq/L (ref 19–32)
Creatinine, Ser: 0.57 mg/dL (ref 0.50–1.10)
GFR calc Af Amer: >90 mL/min (ref >90)
GFR calc non Af Amer: 83 mL/min — ABNORMAL LOW (ref >90)
Glucose, Bld: 130 mg/dL — ABNORMAL HIGH (ref 70–99)
POTASSIUM: 4.6 meq/L (ref 3.7–5.3)
SODIUM: 141 meq/L (ref 137–147)

## 2013-07-04 LAB — HEPARIN LEVEL (UNFRACTIONATED): HEPARIN UNFRACTIONATED: 0.29 [IU]/mL — AB (ref 0.30–0.70)

## 2013-07-04 MED ORDER — APIXABAN 2.5 MG PO TABS: 2.5000 mg | ORAL_TABLET | Freq: Two times a day (BID) | ORAL | Status: DC

## 2013-07-04 MED ORDER — APIXABAN 2.5 MG PO TABS
2.5000 mg | ORAL_TABLET | Freq: Two times a day (BID) | ORAL | Status: DC
  Administered 2013-07-04: 2.5 mg via ORAL
  Filled 2013-07-04: qty 1

## 2013-07-04 MED ORDER — HEPARIN (PORCINE) IN NACL 100-0.45 UNIT/ML-% IJ SOLN
1100.0000 [IU]/h | INTRAMUSCULAR | Status: DC
  Filled 2013-07-04: qty 250

## 2013-07-04 MED ORDER — CIPROFLOXACIN HCL 500 MG PO TABS: 500.0000 mg | ORAL_TABLET | Freq: Two times a day (BID) | ORAL | Status: DC

## 2013-07-04 MED ORDER — APIXABAN 2.5 MG PO TABS
2.5000 mg | ORAL_TABLET | Freq: Two times a day (BID) | ORAL | Status: DC
  Filled 2013-07-04: qty 1

## 2013-07-04 MED ORDER — CYCLOBENZAPRINE HCL 5 MG PO TABS: 5.0000 mg | ORAL_TABLET | Freq: Two times a day (BID) | ORAL | Status: DC | PRN

## 2013-07-04 NOTE — Progress Notes (Addendum)
Subjective: Feels better. Denies chest pain or palpitations.   Objective: Vital signs in last 24 hours: Temp:  [98.2 F (36.8 C)-99.7 F (37.6 C)] 99.7 F (37.6 C) (03/31 0649) Pulse Rate:  [71-79] 79 (03/31 0649) Resp:  [17-18] 18 (03/31 0649) BP: (119-157)/(39-72) 142/72 mmHg (03/31 0649) SpO2:  [93 %-97 %] 93 % (03/31 0649) Last BM Date: 07/03/13  Intake/Output from previous day: 03/30 0701 - 03/31 0700 In: 1406.4 [P.O.:600; I.V.:806.4] Out: 2400 [Urine:2400] Intake/Output this shift: Total I/O In: 360 [P.O.:360] Out: 300 [Urine:300]  Medications Current Facility-Administered Medications  Medication Dose Route Frequency Provider Last Rate Last Dose  . acetaminophen (TYLENOL) tablet 650 mg  650 mg Oral Q6H PRN Theressa Millard, MD       Or  . acetaminophen (TYLENOL) suppository 650 mg  650 mg Rectal Q6H PRN Theressa Millard, MD      . alum & mag hydroxide-simeth (MAALOX/MYLANTA) 200-200-20 MG/5ML suspension 30 mL  30 mL Oral Q6H PRN Theressa Millard, MD      . atorvastatin (LIPITOR) tablet 10 mg  10 mg Oral q1800 Theressa Millard, MD   10 mg at 07/03/13 1750  . calcium-vitamin D (OSCAL WITH D) 500-200 MG-UNIT per tablet 1 tablet  1 tablet Oral Q supper Theressa Millard, MD   1 tablet at 07/03/13 1750  . cefTRIAXone (ROCEPHIN) 1 g in dextrose 5 % 50 mL IVPB  1 g Intravenous Q24H Theressa Millard, MD   1 g at 07/04/13 0253  . citalopram (CELEXA) tablet 10 mg  10 mg Oral QHS Theressa Millard, MD   10 mg at 07/03/13 2124  . cyclobenzaprine (FLEXERIL) tablet 5 mg  5 mg Oral BID PRN Caren Griffins, MD   5 mg at 07/03/13 2259  . estradiol (ESTRACE) tablet 0.5 mg  0.5 mg Oral Daily Theressa Millard, MD   0.5 mg at 07/03/13 0931  . heparin ADULT infusion 100 units/mL (25000 units/250 mL)  1,100 Units/hr Intravenous Continuous Deboraha Sprang, [facility] 11 mL/hr at 07/04/13 8182 1,100 Units/hr at 07/04/13 0842  . HYDROmorphone (DILAUDID) injection 0.5-1 mg  0.5-1  mg Intravenous Q3H PRN Theressa Millard, MD      . levothyroxine (SYNTHROID, LEVOTHROID) tablet 100 mcg  100 mcg Oral QAC breakfast Theressa Millard, MD   100 mcg at 07/04/13 0841  . LORazepam (ATIVAN) tablet 0.5 mg  0.5 mg Oral 3 times per day Theressa Millard, MD   0.5 mg at 07/03/13 2124  . meloxicam (MOBIC) tablet 15 mg  15 mg Oral QPM Theressa Millard, MD   15 mg at 07/03/13 1750  . metoprolol (LOPRESSOR) tablet 50 mg  50 mg Oral BID Theressa Millard, MD   50 mg at 07/03/13 2124  . montelukast (SINGULAIR) tablet 10 mg  10 mg Oral QHS Theressa Millard, MD   10 mg at 07/03/13 2124  . multivitamin with minerals tablet 1 tablet  1 tablet Oral Daily Theressa Millard, MD   1 tablet at 07/03/13 0931  . omega-3 acid ethyl esters (LOVAZA) capsule 1 g  1 g Oral QPM Theressa Millard, MD   1 g at 07/03/13 1750  . ondansetron (ZOFRAN) tablet 4 mg  4 mg Oral Q6H PRN Theressa Millard, MD       Or  . ondansetron (ZOFRAN) injection 4 mg  4 mg Intravenous Q6H PRN Theressa Millard, MD      .  oxyCODONE (Oxy IR/ROXICODONE) immediate release tablet 5 mg  5 mg Oral Q4H PRN Theressa Millard, MD   5 mg at 07/04/13 0651  . pantoprazole (PROTONIX) EC tablet 40 mg  40 mg Oral Daily Theressa Millard, MD   40 mg at 07/03/13 0931  . traMADol (ULTRAM) tablet 50 mg  50 mg Oral Q6H PRN Theressa Millard, MD   50 mg at 07/02/13 1555    PE: General: Well developed, well nourished, NAD. Cooperative with examination.  Head: moist and normal color  Lungs: normal effort. Clear to auscultation.  Heart: RRR, normal S1 and S2. 1/6 murmurs at LSB.  No rubs or gallops. No JVD.  Abdomen: , abdomen soft and non-tender  Extremities: Right leg amputation. Left LE no edema.  2+ radial pulses and 1+ left DP. Neuro: Alert and oriented X 3. Grossly intact.    Lab Results:   Recent Labs  07/02/13 0530 07/03/13 0240 07/04/13 0534  WBC 14.9* 12.8* 10.4  HGB 12.5 11.3* 11.4*  HCT 35.8* 32.9* 33.2*  PLT 207  188 214   BMET  Recent Labs  07/02/13 0530 07/03/13 0240 07/04/13 0534  NA 136* 136* 141  K 3.5* 4.4 4.6  CL 98 101 103  CO2 23 22 24   GLUCOSE 155* 137* 130*  BUN 20 24* 12  CREATININE 0.77 0.77 0.57  CALCIUM 8.3* 8.1* 8.4   Assessment/Plan  Principal Problem:   Atrial fibrillation with RVR that self converted. No new events of A fib on tele. Pt asymptomatic.  Active Problems:   UTI (lower urinary tract infection)   Hypotension   Hypothyroid   Hyperlipidemia   Phantom limb pain   Essential hypertension, benign  Plan:  Afib: on Heparin . Discussed with Pt anticoagulation and she is agreeable to start Eliquis 2.5mg  BID at discharge  HTN: Home meds are Metoprolol, Avapro and Amlodipine. Here only on metoprolol for episodic hypotension. Consider restart Avapro low dose at discharge.    LOS: 3 days   Signed: D. [place] Philippa Sicks, MD Family Medicine  PGY-3   The patient was seen and agree with the above note.  Additions and changes made.   Tarri Fuller PA-C 07/04/2013 9:49 AM  Patient seen and examined. I agree with the assessment and plan as detailed above. See also my additional thoughts below.   The patient is in sinus rhythm. Overall cardiac status is now stable.  Dola Argyle, MD, [facility] 07/04/2013 1:24 PM

## 2013-07-04 NOTE — Progress Notes (Signed)
Reviewed discharge instructions with Kelsey Joseph and daughter, they stated their understanding.  Kelsey Joseph states she does not see Dr. Linna Darner any longer and follows up with Dr. Shelia Media, appointment made for next Monday.  Discharged home with daughter via wheelchair.  Kelsey Joseph

## 2013-07-04 NOTE — Discharge Instructions (Addendum)
Information on my medicine - ELIQUIS (apixaban)  This medication education was reviewed with me or my healthcare representative as part of my discharge preparation.  The pharmacist that spoke with me during my hospital stay was:  Deboraha Sprang, [facility]  Why was Eliquis prescribed for you? Eliquis was prescribed for you to reduce the risk of a blood clot forming that can cause a stroke if you have a medical condition called atrial fibrillation (a type of irregular heartbeat).  What do You need to know about Eliquis ? Take your Eliquis TWICE DAILY - one tablet in the morning and one tablet in the evening with or without food. If you have difficulty swallowing the tablet whole please discuss with your pharmacist how to take the medication safely.  Take Eliquis exactly as prescribed by your doctor and DO NOT stop taking Eliquis without talking to the doctor who prescribed the medication.  Stopping may increase your risk of developing a stroke.  Refill your prescription before you run out.  After discharge, you should have regular check-up appointments with your healthcare provider that is prescribing your Eliquis.  In the future your dose may need to be changed if your kidney function or weight changes by a significant amount or as you get older.  What do you do if you miss a dose? If you miss a dose, take it as soon as you remember on the same day and resume taking twice daily.  Do not take more than one dose of ELIQUIS at the same time to make up a missed dose.  Important Safety Information A possible side effect of Eliquis is bleeding. You should call your healthcare provider right away if you experience any of the following:   Bleeding from an injury or your nose that does not stop.   Unusual colored urine (red or dark brown) or unusual colored stools (red or black).   Unusual bruising for unknown reasons.   A serious fall or if you hit your head (even if there is no  bleeding).  Some medicines may interact with Eliquis and might increase your risk of bleeding or clotting while on Eliquis. To help avoid this, consult your healthcare provider or pharmacist prior to using any new prescription or non-prescription medications, including herbals, vitamins, non-steroidal anti-inflammatory drugs (NSAIDs) and supplements.  This website has more information on Eliquis (apixaban): [email or web address].no.    Atrial Fibrillation Atrial fibrillation is a condition that causes your heart to beat irregularly. It may also cause your heart to beat faster than normal. Atrial fibrillation can prevent your heart from pumping blood normally. It increases your risk of stroke and heart problems. HOME CARE  Take medications as told by your doctor.  Only take medications that your doctor says are safe. Some medications can make the condition worse or happen again.  If blood thinners were prescribed by your doctor, take them exactly as told. Too much can cause bleeding. Too little and you will not have the needed protection against stroke and other problems.  Perform blood tests at home if told by your doctor.  Perform blood tests exactly as told by your doctor.  Do not drink alcohol.  Do not drink beverages with caffeine such as coffee, soda, and some teas.  Maintain a healthy weight.  Do not use diet pills unless your doctor says they are safe. They may make heart problems worse.  Follow diet instructions as told by your doctor.  Exercise regularly as told by your doctor.  Keep all follow-up appointments. GET HELP RIGHT AWAY IF:   You have chest or belly (abdominal) pain.  You feel sick to your stomach (nauseous)  You suddenly have swollen feet and ankles.  You feel dizzy.  You face, arms, or legs feel numb or weak.  There is a change in your vision or speech.  You notice a change in the speed, rhythm, or strength of your heartbeat.  You suddenly begin  peeing (urinating) more often.  You get tired more easily when moving or exercising. MAKE SURE YOU:   Understand these instructions.  Will watch your condition.  Will get help right away if you are not doing well or get worse. Document Released: 12/31/2007 Document Revised: 07/18/2012 Document Reviewed: 05/03/2012 [facility] Patient Information 2014 [place].   Phantom Limb Pain Phantom limb pain occurs in an arm or leg following an amputation. It is pain in an extremity that no longer exists. This pain varies with different patients. Different activities may cause the pain. Some people with an amputated limb experience the opposite of phantom pain, which is phantom pleasure.  The trouble may start in a part of the brain known as the sensory cortex. The sensory cortex is the portion of your brain that processes sensations from the rest of your body. It is hypothesized that when a body part is lost, the corresponding part of the brain is not able to handle the loss and rewires its circuitry to make up for the signals it no longer receives from the missing extremity. The exact mechanism of how phantom limb pain occurs is not known. The severity of pain seems to be correlated with personal problems such as stress and attitude. It also seems to correlate with the amount of pain a person had before the operation. CAUSES   Damaged nerve endings.  Scar tissue at the amputation site. TREATMENT  Phantom limb pain can be severe and debilitating. Most cases of phantom limb pain only last briefly. There are a number of different therapies and medications that may give relief. Keep working with your health care provider until relief is obtained. Some treatments that may be helpful include:  Hypnosis and mental imagery. Their techniques can give patients the needed impetus to recognize their ability to regain control.  Biofeedback.  Relaxation techniques. They are related to hypnosis techniques  and use the mind and body to control pain.  Acupuncture.  Massage.  Exercise.  Antidepressant medicine.  Anticonvulsant medicine.  Narcotics or pain medicines. SEEK MEDICAL CARE IF: Pain is not relieved or increases. Document Released: 06/13/2002 Document Revised: 11/23/2012 Document Reviewed: 08/23/2012 [facility] Patient Information 2014 [place].  Urinary Tract Infection A urinary tract infection (UTI) can occur any place along the urinary tract. The tract includes the kidneys, ureters, bladder, and urethra. A type of germ called bacteria often causes a UTI. UTIs are often helped with antibiotic medicine.  HOME CARE   If given, take antibiotics as told by your doctor. Finish them even if you start to feel better.  Drink enough fluids to keep your pee (urine) clear or pale yellow.  Avoid tea, drinks with caffeine, and bubbly (carbonated) drinks.  Pee often. Avoid holding your pee in for a long time.  Pee before and after having sex (intercourse).  Wipe from front to back after you poop (bowel movement) if you are a woman. Use each tissue only once. GET HELP RIGHT AWAY IF:   You have back pain.  You have lower belly (  abdominal) pain.  You have chills.  You feel sick to your stomach (nauseous).  You throw up (vomit).  Your burning or discomfort with peeing does not go away.  You have a fever.  Your symptoms are not better in 3 days. MAKE SURE YOU:   Understand these instructions.  Will watch your condition.  Will get help right away if you are not doing well or get worse. Document Released: 09/09/2007 Document Revised: 12/16/2011 Document Reviewed: 10/22/2011 [facility] Patient Information 2014 [place], [place].

## 2013-07-04 NOTE — Care Management Note (Addendum)
  Page 1 of 1   07/04/2013     10:26:00 AM   CARE MANAGEMENT NOTE 07/04/2013  Patient:  BULAR, HICKOK   Account Number:  [id number]  Date Initiated:  07/03/2013  Documentation initiated by:  Elissa Hefty  Subjective/Objective Assessment:   adm w  rvr     Action/Plan:   lives w husband, pcp dr Shanon Brow hopper   Anticipated DC Date:     Anticipated DC Plan:           Choice offered to / List presented to:             Status of service:  Completed, signed off Medicare Important Message given?   (If response is "NO", the following Medicare IM given date fields will be blank) Date Medicare IM given:   Date Additional Medicare IM given:    Discharge Disposition:  HOME/SELF CARE  Per UR Regulation:  Reviewed for med. necessity/level of care/duration of stay  If discussed at [place] of Stay Meetings, dates discussed:    Comments:  07-04-13 [address] [place] [phone] CM did  a benefits check for eliquis 2.5 mg. Will make pt aware of  cost once completed. Pt states she uses [facility]. Usually has a mail order via [employer]. CM did call [facility] and the co pay will be 79.50 and the medication is available. Pt will need 30 day Rx no refills and then the original  Rx for mail order 90 day with refills.  Thanks. No further needs from CM at this time.

## 2013-07-04 NOTE — Discharge Summary (Signed)
Physician Discharge Summary  Kelsey Joseph KDT:[id number] DOB: [birth year] DOA: 07/01/2013  PCP: Ruben Reason, MD  Admit date: 07/01/2013 Discharge date: 07/04/2013  Time spent: 35 minutes  Recommendations for Outpatient Follow-up:  1. Follow up with PCP in 1 week 2. Follow up with cardiology in 1-2 months   Discharge Diagnoses:  Principal Problem:   Atrial fibrillation with RVR Active Problems:   UTI (lower urinary tract infection)   Hypotension   Hypothyroid   Hyperlipidemia   Phantom limb pain   Essential hypertension, benign   Sepsis  Discharge Condition: stable  Diet recommendation: heart healthy  Filed Weights   07/01/13 2308 07/02/13 0500  Weight: 56.7 kg (125 lb) 58.6 kg (129 lb 3 oz)   History of present illness:  Kelsey Joseph is a 80 y.o. female with a history of Multiple surgeries to both legs due to Osteomyelitis with Amputation of the Right Lower Leg to the Proximal Femur who presents to the ED with complaints of worsening Phantom pain and fevers and chills and a feeling of shakiness x 1 day. She had a temperature at home to 100.6 and of note she had a recent UTI treated without patient antibiotics. She was evaluated in the ED and was found to have a UTI and also found to have Atrial fibrillation with RVR. She was placed on an IV Cardizem drip and given IV Rocephin and referred for admission. Also please note that she has seen her Orthopedic Surgeon Dr. Mayer Camel for an outpatient workrup of her Severe Phantom pain and she has had multiple MRIs, and reports that it is suspected that she has a "Pinched Nerve". She also has bilateral shoulder pain and has been told that in the future she may have to have shoulder surgeries.   Hospital Course:  A fib with RVR - patient was admitted to step down unit on a Cardizem drip. Heart afebrile with RVR is like he used to her sepsis due to her urinary tract infection. Patient was started on heparin drip, and her anti-coagulation was  converted to Apixaban upon discharge.  Cardiology has been consulted and has seen the patient while she was hospitalized. Her atrial fibrillation converted to sinus rhythm, and she remained in sinus rhythm on discharge. She had a 2-D echo which showed an ejection fraction of 99-83%, grade 2 diastolic dysfunction. UTI - patient was initiated on ceftriaxone, and her antibiotics were narrowed to ciprofloxacin based on microbiology. She was found to have Escherichia coli, sensitive to ciprofloxacin, which she was discharged on to continue for 7 additional days. Hypothyroidism - continue synthroid  HLD - statin  Phantom limb pain - workup mostly as an outpatient  Procedures:  2D echo Study Conclusions - Left ventricle: The cavity size was normal. Wall thickness was normal. Systolic function was normal. The estimated ejection fraction was in the range of 60% to 65%. Wall motion was normal; there were no regional wall motion abnormalities. Features are consistent with a pseudonormal left ventricular filling pattern, with concomitant abnormal relaxation and increased filling pressure (grade 2 diastolic dysfunction).- Mitral valve: Mild regurgitation.- Right ventricle: The cavity size was mildly dilated. Wall thickness was normal.   Consultations:  Cardiology  Discharge Exam: Filed Vitals:   07/03/13 1130 07/03/13 1700 07/03/13 2037 07/04/13 0649  BP: 119/39 134/50 157/54 142/72  Pulse: 71 73 78 79  Temp: 98.2 F (36.8 C) 98.7 F (37.1 C) 98.4 F (36.9 C) 99.7 F (37.6 C)  TempSrc: Oral Oral Oral Oral  Resp:  17 18 18   Height:      Weight:      SpO2: 96% 96% 97% 93%   General: NAD Cardiovascular: RRR Respiratory: CTA biL  Discharge Instructions     Medication List         amLODipine 10 MG tablet  Commonly known as:  NORVASC  Take 10 mg by mouth daily.     apixaban 2.5 MG Tabs tablet  Commonly known as:  ELIQUIS  Take 1 tablet (2.5 mg total) by mouth 2 (two) times daily.      BIOTIN PO  Take 2 tablets by mouth daily.     CALCIUM + D PO  Take 1 tablet by mouth every evening.     ciprofloxacin 500 MG tablet  Commonly known as:  CIPRO  Take 1 tablet (500 mg total) by mouth 2 (two) times daily.     citalopram 20 MG tablet  Commonly known as:  CELEXA  Take 10 mg by mouth at bedtime.     conjugated estrogens vaginal cream  Commonly known as:  PREMARIN  Place 1 Applicatorful vaginally See admin instructions. 1-2 times per week at bedtime     COQ10 PO  Take 150 mg by mouth every evening.     cyclobenzaprine 5 MG tablet  Commonly known as:  FLEXERIL  Take 1 tablet (5 mg total) by mouth 2 (two) times daily as needed for muscle spasms.     estradiol 0.5 MG tablet  Commonly known as:  ESTRACE  Take 0.5 mg by mouth every evening.     irbesartan 300 MG tablet  Commonly known as:  AVAPRO  Take 300 mg by mouth every evening.     levothyroxine 100 MCG tablet  Commonly known as:  SYNTHROID, LEVOTHROID  Take 100 mcg by mouth daily before breakfast.     LORazepam 0.5 MG tablet  Commonly known as:  ATIVAN  Take 0.5 mg by mouth every 8 (eight) hours. For anxiety     meloxicam 15 MG tablet  Commonly known as:  MOBIC  Take 15 mg by mouth every evening.     metoprolol 50 MG tablet  Commonly known as:  LOPRESSOR  Take 50 mg by mouth 2 (two) times daily.     montelukast 10 MG tablet  Commonly known as:  SINGULAIR  Take 10 mg by mouth daily.     multivitamin with minerals Tabs tablet  Take 1 tablet by mouth daily.     omega-3 acid ethyl esters 1 G capsule  Commonly known as:  LOVAZA  Take 1 g by mouth every evening.     omeprazole 40 MG capsule  Commonly known as:  PRILOSEC  Take 40 mg by mouth daily.     PHILLIPS COLON HEALTH PO  Take 1 tablet by mouth daily.     simvastatin 20 MG tablet  Commonly known as:  ZOCOR  Take 20 mg by mouth at bedtime.     torsemide 20 MG tablet  Commonly known as:  DEMADEX  Take 20 mg by mouth daily.      traMADol 50 MG tablet  Commonly known as:  ULTRAM  Take 50 mg by mouth every 6 (six) hours as needed for moderate pain.     XANAX PO  Take 1 tablet by mouth once. Not patient's prescribtion           Follow-up Information   Follow up with HOPPER,DAVID, MD. Schedule an appointment as soon as possible for a  visit in 1 week.   Specialty:  Family Medicine   Contact information:   [place] [address] [phone]       Follow up with Candee Furbish, MD. Schedule an appointment as soon as possible for a visit in 2 months.   Specialty:  Cardiology   Contact information:   [address] [place] [address] [phone]       The results of significant diagnostics from this hospitalization (including imaging, microbiology, ancillary and laboratory) are listed below for reference.    Significant Diagnostic Studies: Dg Chest 2 View  07/02/2013   CLINICAL DATA:  Leg pain.  EXAM: CHEST  2 VIEW  COMPARISON:  None available for comparison at time of study interpretation.  FINDINGS: The cardiac silhouette is upper limits of normal in size, mediastinal silhouette is nonsuspicious. Slight prominence of the bronchovascular markings are likely chronic without pleural effusions or focal consolidations. No pneumothorax. Moderate mid thoracic degenerative change. Remote left lateral rib fractures.Soft tissue planes are nonsuspicious.  IMPRESSION: Borderline cardiomegaly, no acute pulmonary process.   Electronically Signed   By: Elon Alas   On: 07/02/2013 02:03   Dg Pelvis 1-2 Views  07/02/2013   CLINICAL DATA:  LEG PAIN.  EXAM: PELVIS - 1-2 VIEW  COMPARISON:  None available for comparison at time of study interpretation.  FINDINGS: No acute fracture deformity. Amputation of the right femur with deformity of the right acetabulum which is shallow, chronic in appearance. Included view of the left leg demonstrates intra medullary rod, and surgical clips within the  soft tissues. Bone mineral density is decreased. Lucency within the right iliac wing may reflect donor site/ postoperative change. Surgical clips in the pelvis.  IMPRESSION: Right hip amputation/disarticulation, No acute fracture deformity or dislocation. Osteopenia may decrease sensitivity for acute nondisplaced fractures.   Electronically Signed   By: Elon Alas   On: 07/02/2013 02:02    Microbiology: Recent Results (from the past 240 hour(s))  URINE CULTURE     Status: None   Collection Time    07/02/13 12:40 AM      Result Value Ref Range Status   Specimen Description URINE, RANDOM   Final   Special Requests NONE   Final   Culture  Setup Time     Final   Value: 07/02/2013 14:10     Performed at [facility]     Final   Value: >=100,000 COLONIES/ML     Performed at [facility]   Culture     Final   Value: ESCHERICHIA COLI     Performed at [facility]   Report Status 07/04/2013 FINAL   Final   Organism ID, Bacteria ESCHERICHIA COLI   Final  CULTURE, BLOOD (ROUTINE X 2)     Status: None   Collection Time    07/02/13  2:30 AM      Result Value Ref Range Status   Specimen Description BLOOD LEFT ARM   Final   Special Requests     Final   Value: BOTTLES DRAWN AEROBIC AND ANAEROBIC 10CC AEROBIC [place] ANAEROBIC   Culture  Setup Time     Final   Value: 07/02/2013 12:34     Performed at [facility]   Culture     Final   Value:        BLOOD CULTURE RECEIVED NO GROWTH TO DATE CULTURE WILL BE HELD FOR 5 DAYS BEFORE ISSUING  A FINAL NEGATIVE REPORT     Performed at [facility]   Report Status PENDING   Incomplete  CULTURE, BLOOD (ROUTINE X 2)     Status: None   Collection Time    07/02/13  2:37 AM      Result Value Ref Range Status   Specimen Description BLOOD LEFT FOREARM   Final   Special Requests BOTTLES DRAWN AEROBIC ONLY 4CC   Final   Culture  Setup Time     Final   Value: 07/02/2013 12:34     Performed at [facility]   Culture     Final   Value:        BLOOD CULTURE RECEIVED NO GROWTH TO DATE CULTURE WILL BE HELD FOR 5 DAYS BEFORE ISSUING A FINAL NEGATIVE REPORT     Performed at [facility]   Report Status PENDING   Incomplete  MRSA PCR SCREENING     Status: None   Collection Time    07/02/13  4:51 AM      Result Value Ref Range Status   MRSA by PCR NEGATIVE  NEGATIVE Final   Comment:            The GeneXpert MRSA Assay (FDA     approved for NASAL specimens     only), is one component of a     comprehensive MRSA colonization     surveillance program. It is not     intended to diagnose MRSA     infection nor to guide or     monitor treatment for     MRSA infections.     Labs: Basic Metabolic Panel:  Recent Labs Lab 07/02/13 0047 07/02/13 0530 07/03/13 0240 07/04/13 0534  NA 135* 136* 136* 141  K 3.9 3.5* 4.4 4.6  CL 95* 98 101 103  CO2 22 23 22 24   GLUCOSE 158* 155* 137* 130*  BUN 22 20 24* 12  CREATININE 0.70 0.77 0.77 0.57  CALCIUM 8.9 8.3* 8.1* 8.4   CBC:  Recent Labs Lab 07/02/13 0047 07/02/13 0530 07/03/13 0240 07/04/13 0534  WBC 14.6* 14.9* 12.8* 10.4  NEUTROABS 12.5*  --   --   --   HGB 13.6 12.5 11.3* 11.4*  HCT 39.2 35.8* 32.9* 33.2*  MCV 90.3 90.4 91.6 91.5  PLT 209 207 188 214    Signed:  Ravenne Wayment  [facility] 07/04/2013, 4:37 PM

## 2013-07-04 NOTE — Progress Notes (Signed)
ANTICOAGULATION CONSULT NOTE - Follow Up  Pharmacy Consult for Heparin Indication: atrial fibrillation  Allergies  Allergen Reactions  . Sulfa Antibiotics Other (See Comments)    Severe diarrhea   Patient Measurements: Height: 5\' 3"  (160 cm) Weight: 129 lb 3 oz (58.6 kg) IBW/kg (Calculated) : 52.4 Heparin Dosing Weight: 58.6 kg (actual body weight)  Vital Signs: Temp: 99.7 F (37.6 C) (03/31 0649) Temp src: Oral (03/31 0649) BP: 142/72 mmHg (03/31 0649) Pulse Rate: 79 (03/31 0649)  Labs:  Recent Labs  07/02/13 0530  07/03/13 0240 07/03/13 0746 07/03/13 2028 07/04/13 0534  HGB 12.5  --  11.3*  --   --  11.4*  HCT 35.8*  --  32.9*  --   --  33.2*  PLT 207  --  188  --   --  214  HEPARINUNFRC  --   < > 0.29* 0.35 <0.10* 0.29*  CREATININE 0.77  --  0.77  --   --  0.57  < > = values in this interval not displayed.  Estimated Creatinine Clearance: 43.3 ml/min (by C-G formula based on Cr of 0.57).   Assessment: 86yof presented to the ED with c/o worsening phantom leg pain, but found with UTI and to be in afib w/ RVR. Patient has possible brief h/o afib, but was not being treated for it PTA.  Pharmacy was consulted to begin IV heparin. Heparin level this morning is just below goal. CBC is stable. No bleeding reported.  Goal of Therapy:  Heparin level 0.3-0.7 units/ml Monitor platelets by anticoagulation protocol: Yes   Plan:  1) Increase heparin to 1100 units/hr 2) Follow up heparin level, CBC in AM 3) Follow up transition to oral [facility]  Nena Jordan, PharmD, BCPS 07/04/2013, 8:27 AM

## 2013-07-08 LAB — CULTURE, BLOOD (ROUTINE X 2)
CULTURE: NO GROWTH
Culture: NO GROWTH

## 2013-07-10 DIAGNOSIS — I4891 Unspecified atrial fibrillation: Secondary | ICD-10-CM | POA: Diagnosis not present

## 2013-07-10 DIAGNOSIS — Z87440 Personal history of urinary (tract) infections: Secondary | ICD-10-CM | POA: Diagnosis not present

## 2013-07-10 DIAGNOSIS — G4700 Insomnia, unspecified: Secondary | ICD-10-CM | POA: Diagnosis not present

## 2013-07-10 DIAGNOSIS — I10 Essential (primary) hypertension: Secondary | ICD-10-CM | POA: Diagnosis not present

## 2013-07-24 DIAGNOSIS — D1801 Hemangioma of skin and subcutaneous tissue: Secondary | ICD-10-CM | POA: Diagnosis not present

## 2013-07-24 DIAGNOSIS — Z85828 Personal history of other malignant neoplasm of skin: Secondary | ICD-10-CM | POA: Diagnosis not present

## 2013-07-24 DIAGNOSIS — L821 Other seborrheic keratosis: Secondary | ICD-10-CM | POA: Diagnosis not present

## 2013-07-24 DIAGNOSIS — L578 Other skin changes due to chronic exposure to nonionizing radiation: Secondary | ICD-10-CM | POA: Diagnosis not present

## 2013-07-24 DIAGNOSIS — L819 Disorder of pigmentation, unspecified: Secondary | ICD-10-CM | POA: Diagnosis not present

## 2013-07-27 DIAGNOSIS — G894 Chronic pain syndrome: Secondary | ICD-10-CM | POA: Diagnosis not present

## 2013-07-27 DIAGNOSIS — R6889 Other general symptoms and signs: Secondary | ICD-10-CM | POA: Diagnosis not present

## 2013-07-27 DIAGNOSIS — G547 Phantom limb syndrome without pain: Secondary | ICD-10-CM | POA: Diagnosis not present

## 2013-08-01 DIAGNOSIS — N816 Rectocele: Secondary | ICD-10-CM | POA: Diagnosis not present

## 2013-08-24 DIAGNOSIS — G5790 Unspecified mononeuropathy of unspecified lower limb: Secondary | ICD-10-CM | POA: Diagnosis not present

## 2013-08-27 ENCOUNTER — Other Ambulatory Visit: Payer: Self-pay | Admitting: Internal Medicine

## 2013-08-31 ENCOUNTER — Encounter: Payer: Self-pay | Admitting: Cardiovascular Disease

## 2013-08-31 ENCOUNTER — Ambulatory Visit (INDEPENDENT_AMBULATORY_CARE_PROVIDER_SITE_OTHER): Payer: Medicare Other | Admitting: Cardiovascular Disease

## 2013-08-31 VITALS — BP 140/76 | HR 60 | Resp 16 | Ht 63.5 in | Wt 125.0 lb

## 2013-08-31 DIAGNOSIS — R55 Syncope and collapse: Secondary | ICD-10-CM | POA: Diagnosis not present

## 2013-08-31 DIAGNOSIS — I4891 Unspecified atrial fibrillation: Secondary | ICD-10-CM
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
## 2013-08-31 NOTE — Patient Instructions (Signed)
Alternative meds to Eliquis are :  Xarelto, Pradaxa or Savaysa.  Your physician has recommended that you wear an event monitor for 30 days. Event monitors are medical devices that record the heart's electrical activity. Doctors most often [place] these monitors to diagnose arrhythmias. Arrhythmias are problems with the speed or rhythm of the heartbeat. The monitor is a small, portable device. You can wear one while you do your normal daily activities. This is usually used to diagnose what is causing palpitations/syncope (passing out).  Dr. Sallyanne Kuster recommends that you schedule a follow-up appointment in: 6-7 weeks

## 2013-09-02 ENCOUNTER — Encounter: Payer: Self-pay | Admitting: Cardiovascular Disease

## 2013-09-02 NOTE — Assessment & Plan Note (Addendum)
She is on beta blockers for rate control and takes a novel anticoagulant. She finds the Eliquis expensive and wonders about alternatives. Discussed these. I don't think warfarin is a good option for her since transportation for prothrombin time monitoring is not a trivial burden.  Antiarrhythmic therapy is not justified at this time, since the overall burden of atrial fibrillation is low and the arrhythmia is not clearly symptomatic. I wonder whether her episodes of near syncope might be an expression of post conversion sinus node pauses (tachycardia-bradycardia syndrome). She does have a long first degree AV block as an expression of conduction system disease. Have recommended that she wear an event monitor. If she has unexplained full-blown syncope I would strongly encourage an implantable loop recorder.

## 2013-10-13 ENCOUNTER — Telehealth: Payer: Self-pay | Admitting: *Deleted

## 2013-10-13 NOTE — Telephone Encounter (Signed)
Event Monitor results called to patient.  Voiced understanding.

## 2013-10-13 NOTE — Telephone Encounter (Signed)
Message copied by Tressa Busman on Fri Oct 13, 2013  8:44 AM ------      Message from: Sanda Klein      Created: Wed Oct 11, 2013  2:48 PM       Several brief bursts of paroxysmal atrial tachycardia, no true Afib. No post - conversion pauses were seen, no brady arrhythmia. No arrhythmia that could have caused syncope was recorded ------

## 2013-10-19 DIAGNOSIS — G547 Phantom limb syndrome without pain: Secondary | ICD-10-CM | POA: Diagnosis not present

## 2013-10-19 DIAGNOSIS — G5790 Unspecified mononeuropathy of unspecified lower limb: Secondary | ICD-10-CM | POA: Diagnosis not present

## 2013-10-19 DIAGNOSIS — G894 Chronic pain syndrome: Secondary | ICD-10-CM | POA: Diagnosis not present

## 2013-11-09 ENCOUNTER — Encounter: Payer: Self-pay | Admitting: Cardiovascular Disease

## 2013-11-09 ENCOUNTER — Ambulatory Visit (INDEPENDENT_AMBULATORY_CARE_PROVIDER_SITE_OTHER): Payer: Medicare Other | Admitting: Cardiovascular Disease

## 2013-11-09 VITALS — BP 120/42 | HR 60 | Resp 16 | Ht 63.5 in | Wt 133.0 lb

## 2013-11-09 DIAGNOSIS — I480 Paroxysmal atrial fibrillation: Secondary | ICD-10-CM

## 2013-11-09 DIAGNOSIS — I440 Atrioventricular block, first degree: Secondary | ICD-10-CM

## 2013-11-09 DIAGNOSIS — I471 Supraventricular tachycardia: Secondary | ICD-10-CM

## 2013-11-09 DIAGNOSIS — R55 Syncope and collapse: Secondary | ICD-10-CM | POA: Diagnosis not present

## 2013-11-09 DIAGNOSIS — I4891 Unspecified atrial fibrillation: Secondary | ICD-10-CM | POA: Diagnosis not present

## 2013-11-09 DIAGNOSIS — R002 Palpitations: Secondary | ICD-10-CM

## 2013-11-09 MED ORDER — AMLODIPINE BESYLATE 5 MG PO TABS: 5.0000 mg | ORAL_TABLET | Freq: Every day | ORAL | Status: DC

## 2013-11-09 NOTE — Patient Instructions (Signed)
Decrease Amlodipine to 5mg  daily.  Dr. Sallyanne Kuster recommends that you schedule a follow-up appointment in: 6 months.

## 2013-11-12 ENCOUNTER — Encounter: Payer: Self-pay | Admitting: Cardiovascular Disease

## 2013-11-12 DIAGNOSIS — R55 Syncope and collapse: Secondary | ICD-10-CM | POA: Insufficient documentation

## 2013-11-12 DIAGNOSIS — I471 Supraventricular tachycardia: Secondary | ICD-10-CM | POA: Insufficient documentation
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
## 2013-11-17 DIAGNOSIS — B372 Candidiasis of skin and nail: Secondary | ICD-10-CM | POA: Diagnosis not present

## 2013-11-18 ENCOUNTER — Encounter: Payer: Self-pay | Admitting: Cardiovascular Disease

## 2013-11-28 DIAGNOSIS — I4891 Unspecified atrial fibrillation: Secondary | ICD-10-CM | POA: Diagnosis not present

## 2013-11-28 DIAGNOSIS — I2510 Atherosclerotic heart disease of native coronary artery without angina pectoris: Secondary | ICD-10-CM | POA: Diagnosis not present

## 2013-11-28 DIAGNOSIS — I10 Essential (primary) hypertension: Secondary | ICD-10-CM | POA: Diagnosis not present

## 2013-11-28 DIAGNOSIS — R000 Tachycardia, unspecified: Secondary | ICD-10-CM | POA: Diagnosis not present

## 2013-11-28 DIAGNOSIS — G8928 Other chronic postprocedural pain: Secondary | ICD-10-CM | POA: Diagnosis not present

## 2013-11-28 DIAGNOSIS — T8730 Neuroma of amputation stump, unspecified extremity: Secondary | ICD-10-CM | POA: Diagnosis not present

## 2013-11-28 DIAGNOSIS — M79609 Pain in unspecified limb: Secondary | ICD-10-CM | POA: Diagnosis not present

## 2013-11-28 DIAGNOSIS — Z91040 Latex allergy status: Secondary | ICD-10-CM | POA: Diagnosis not present

## 2013-11-28 DIAGNOSIS — E039 Hypothyroidism, unspecified: Secondary | ICD-10-CM | POA: Diagnosis not present

## 2013-11-28 DIAGNOSIS — M129 Arthropathy, unspecified: Secondary | ICD-10-CM | POA: Diagnosis not present

## 2013-11-28 DIAGNOSIS — Z888 Allergy status to other drugs, medicaments and biological substances status: Secondary | ICD-10-CM | POA: Diagnosis not present

## 2013-11-28 DIAGNOSIS — Z9660 Presence of unspecified orthopedic joint implant: Secondary | ICD-10-CM | POA: Diagnosis not present

## 2013-12-01 DIAGNOSIS — B372 Candidiasis of skin and nail: Secondary | ICD-10-CM | POA: Diagnosis not present

## 2013-12-08 ENCOUNTER — Encounter: Payer: Self-pay | Admitting: Cardiovascular Disease

## 2013-12-15 ENCOUNTER — Telehealth: Payer: Self-pay | Admitting: *Deleted

## 2013-12-15 ENCOUNTER — Telehealth: Payer: Self-pay | Admitting: Cardiovascular Disease

## 2013-12-15 ENCOUNTER — Encounter: Payer: Self-pay | Admitting: Cardiovascular Disease

## 2013-12-15 DIAGNOSIS — R55 Syncope and collapse: Secondary | ICD-10-CM

## 2013-12-15 NOTE — Telephone Encounter (Signed)
Patient states she is in agreement to have the Loop Recorder implanted.  She will have it in the next week or two.  Will send message to Dr. Loletha Grayer and send to [facility] for scheduling (Tuesday 9/15 or 9/22).

## 2013-12-15 NOTE — Telephone Encounter (Signed)
Spoke with patient regarding Loop recorder implant that was ordered by Dr. Sallyanne Kuster.  Scheduled for 12/26/13 @ 3:30 pm---arrive at short stay at [facility] at 1:30 pm.  NPO after midnight except for morning medications.  Patient will need a driver after the procedure.  Patient voiced her understanding.

## 2013-12-15 NOTE — Telephone Encounter (Signed)
Thanks

## 2013-12-18 ENCOUNTER — Other Ambulatory Visit: Payer: Self-pay | Admitting: *Deleted

## 2013-12-21 ENCOUNTER — Telehealth: Payer: Self-pay | Admitting: Cardiovascular Disease

## 2013-12-21 NOTE — Telephone Encounter (Signed)
Notified patient that she will not need to spend the night.  Voiced understanding.

## 2013-12-21 NOTE — Telephone Encounter (Signed)
Pt is having a loop recorder put  In on Tuesday. Her question is,will she definitely have to spend the night or just depends on how she does? She said she received a letter mentioning spending the night,

## 2013-12-26 ENCOUNTER — Telehealth: Payer: Self-pay | Admitting: Cardiovascular Disease

## 2013-12-26 ENCOUNTER — Encounter (HOSPITAL_COMMUNITY)
Admission: RE | Disposition: A | Discharge status: Home / Self Care | Payer: Self-pay | Source: Ambulatory Visit | Attending: Cardiovascular Disease

## 2013-12-26 ENCOUNTER — Ambulatory Visit (HOSPITAL_COMMUNITY)
Admission: RE | Admit: 2013-12-26 | Discharge: 2013-12-26 | Disposition: A | Discharge status: Home / Self Care | Payer: Medicare Other | Source: Ambulatory Visit | Attending: Cardiovascular Disease | Admitting: Cardiovascular Disease

## 2013-12-26 DIAGNOSIS — I4891 Unspecified atrial fibrillation: Secondary | ICD-10-CM | POA: Insufficient documentation

## 2013-12-26 DIAGNOSIS — E785 Hyperlipidemia, unspecified: Secondary | ICD-10-CM | POA: Diagnosis not present

## 2013-12-26 DIAGNOSIS — Z79899 Other long term (current) drug therapy: Secondary | ICD-10-CM | POA: Insufficient documentation

## 2013-12-26 DIAGNOSIS — R55 Syncope and collapse: Secondary | ICD-10-CM | POA: Insufficient documentation

## 2013-12-26 DIAGNOSIS — Z96649 Presence of unspecified artificial hip joint: Secondary | ICD-10-CM | POA: Insufficient documentation

## 2013-12-26 DIAGNOSIS — Z7901 Long term (current) use of anticoagulants: Secondary | ICD-10-CM | POA: Diagnosis not present

## 2013-12-26 DIAGNOSIS — H9190 Unspecified hearing loss, unspecified ear: Secondary | ICD-10-CM | POA: Insufficient documentation

## 2013-12-26 DIAGNOSIS — K219 Gastro-esophageal reflux disease without esophagitis: Secondary | ICD-10-CM | POA: Insufficient documentation

## 2013-12-26 DIAGNOSIS — E039 Hypothyroidism, unspecified: Secondary | ICD-10-CM | POA: Insufficient documentation

## 2013-12-26 DIAGNOSIS — F411 Generalized anxiety disorder: Secondary | ICD-10-CM | POA: Diagnosis not present

## 2013-12-26 DIAGNOSIS — Z96659 Presence of unspecified artificial knee joint: Secondary | ICD-10-CM | POA: Diagnosis not present

## 2013-12-26 DIAGNOSIS — I480 Paroxysmal atrial fibrillation: Secondary | ICD-10-CM

## 2013-12-26 DIAGNOSIS — S78119A Complete traumatic amputation at level between unspecified hip and knee, initial encounter: Secondary | ICD-10-CM | POA: Insufficient documentation

## 2013-12-26 HISTORY — PX: LOOP RECORDER IMPLANT: SHX5477

## 2013-12-26 SURGERY — LOOP RECORDER IMPLANT
Anesthesia: LOCAL

## 2013-12-26 MED ORDER — LIDOCAINE-EPINEPHRINE 1 %-1:100000 IJ SOLN
INTRAMUSCULAR | Status: AC
  Filled 2013-12-26: qty 1

## 2013-12-26 NOTE — Op Note (Signed)
LOOP RECORDER IMPLANT   Procedure report   Procedure performed:  Loop recorder implantation   Reason for procedure:  Recurrent syncope/near-syncope Procedure performed by:  Sanda Klein, MD  Complications:  None  Estimated blood loss:  <5 mL  Medications administered during procedure:  Lidocaine 1% locally  Device details:  Medtronic Reveal Linq model number [id number], serial number [id number] Procedure details:  After the risks and benefits of the procedure were discussed the patient provided informed consent. She was brought to the cardiac catheter lab in the fasting state. The patient was prepped and draped in usual sterile fashion. Local anesthesia with 1% lidocaine was administered to an area 2 cm to the left of the sternum in the 4th intercostal space. A horizontal incision was made using the incision tool. The introducer was then used to create a subcutaneous tunnel and carefully deploy the device. Local pressure was held to ensure hemostasis. Appropriate sensing (R waves 0.41 mV) was noted. The incision was closed with SteriStrips and a sterile dressing was applied.   Sanda Klein, MD, [place] [phone] office [phone] pager 12/26/2013 3:49 PM
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
## 2013-12-26 NOTE — Discharge Instructions (Signed)
Wound Care REMOVE OUTER DRESSING IN 48 HOURS, DO NOT REMOVE STERI STRIPS- LET THEM FALL OFF ON THERE OWN. YOU MAY SHOWER.  SUPPLEMENTAL WOUND CARE INSTRUCTION SHEET GIVEN,  HOME CARE   Only take medicine as told by your doctor.  Change the bandage if it gets wet, dirty, or starts to smell.  Take showers. Do not take baths, swim, or do anything that puts your wound under water.  Keep all doctor visits as told. GET HELP RIGHT AWAY IF:   Yellowish-white fluid (pus) comes from the wound.  Medicine does not lessen your pain.  There is a red streak going away from the wound.  You have a fever. CALL OFFICE  MAKE SURE YOU:   Understand these instructions.  Will watch your condition.  Will get help right away if you are not doing well or get worse. Document Released: 12/31/2007 Document Revised: 06/15/2011 Document Reviewed: 07/27/2010 [facility] Patient Information 2015 [place], [place]. This information is not intended to replace advice given to you by your health care provider. Make sure you discuss any questions you have with your health care provider.

## 2013-12-26 NOTE — Telephone Encounter (Signed)
Closed encounter °

## 2014-01-02 DIAGNOSIS — G5790 Unspecified mononeuropathy of unspecified lower limb: Secondary | ICD-10-CM | POA: Diagnosis not present

## 2014-01-02 DIAGNOSIS — G547 Phantom limb syndrome without pain: Secondary | ICD-10-CM | POA: Diagnosis not present

## 2014-01-02 DIAGNOSIS — G894 Chronic pain syndrome: Secondary | ICD-10-CM | POA: Diagnosis not present

## 2014-01-09 ENCOUNTER — Encounter: Payer: Self-pay | Admitting: Cardiovascular Disease

## 2014-01-11 ENCOUNTER — Encounter: Payer: Self-pay | Admitting: Physician Assistant

## 2014-01-11 ENCOUNTER — Ambulatory Visit: Payer: Medicare Other | Admitting: Physician Assistant

## 2014-01-11 VITALS — BP 150/62 | HR 62 | Ht 63.5 in | Wt 131.6 lb

## 2014-01-11 DIAGNOSIS — I480 Paroxysmal atrial fibrillation: Secondary | ICD-10-CM

## 2014-01-11 DIAGNOSIS — R55 Syncope and collapse: Secondary | ICD-10-CM

## 2014-01-11 DIAGNOSIS — I10 Essential (primary) hypertension: Secondary | ICD-10-CM

## 2014-01-11 NOTE — Assessment & Plan Note (Signed)
Patient reports she is out of her eliquis and hasn't taken any for the last 2 days. She did order some more apparently by mail order but hasn't coming in later with some samples.
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
## 2014-01-11 NOTE — Assessment & Plan Note (Signed)
Status post Medtronic links loop recorder implant on 12/26/2013. Patient's wound is well-healed mild ecchymosis but no signs of infection.  Asked her to stay out of the pool for another week.

## 2014-01-11 NOTE — Assessment & Plan Note (Signed)
Blood pressure mildly elevated. Was controlled on previous office visit. no change in therapy to

## 2014-01-11 NOTE — Patient Instructions (Signed)
1.  Followup with Dr C in 3 months.

## 2014-01-25 ENCOUNTER — Ambulatory Visit (INDEPENDENT_AMBULATORY_CARE_PROVIDER_SITE_OTHER): Payer: Medicare Other | Admitting: *Deleted

## 2014-01-25 DIAGNOSIS — Z23 Encounter for immunization: Secondary | ICD-10-CM | POA: Diagnosis not present

## 2014-01-25 DIAGNOSIS — R55 Syncope and collapse: Secondary | ICD-10-CM | POA: Diagnosis not present

## 2014-01-26 ENCOUNTER — Telehealth: Payer: Self-pay | Admitting: Internal Medicine

## 2014-01-26 ENCOUNTER — Encounter: Payer: Self-pay | Admitting: Cardiovascular Disease

## 2014-01-26 NOTE — Telephone Encounter (Signed)
Spoke to patient because she had mild chest discomfort earlier today. She had her IRL monitor interrogated and she saw a yellow warning. Otherwise feels well right now.  I advised her i don't have access to her monitor information but if she felt unwell she should come to the ED, otherwise to call Dr. Antony Contras office in the AM for additional guidance on her monitor. She opted for the latter but is aware that she was counseled to come to the ED for concerning symptoms.  Raliegh Ip, MD MPH

## 2014-02-01 ENCOUNTER — Encounter: Payer: Self-pay | Admitting: Cardiovascular Disease

## 2014-02-02 NOTE — Progress Notes (Signed)
Loop recorder 

## 2014-02-23 ENCOUNTER — Ambulatory Visit (INDEPENDENT_AMBULATORY_CARE_PROVIDER_SITE_OTHER): Payer: Medicare Other | Admitting: *Deleted

## 2014-02-23 DIAGNOSIS — R55 Syncope and collapse: Secondary | ICD-10-CM

## 2014-02-27 NOTE — Progress Notes (Signed)
Loop recorder 

## 2014-03-08 ENCOUNTER — Telehealth: Payer: Self-pay | Admitting: *Deleted

## 2014-03-08 NOTE — Telephone Encounter (Signed)
Spoke to pt about 2.5 sec pause from 11-12. I informed pt that [facility] wants pt to keep appt for 05-2014. No changes in meds at this time. Pt was encouraged to call if she has any further symptoms of presyncope. Patient voiced understanding and told me about an episode she had on 11-30 where she felt a rapid HR. Strip showed to [facility]---tachy present. No changes for now. Patient to F/U as scheduled.

## 2014-03-12 ENCOUNTER — Telehealth: Payer: Self-pay | Admitting: Cardiovascular Disease

## 2014-03-12 DIAGNOSIS — Z79899 Other long term (current) drug therapy: Secondary | ICD-10-CM

## 2014-03-12 DIAGNOSIS — Z7901 Long term (current) use of anticoagulants: Secondary | ICD-10-CM

## 2014-03-12 DIAGNOSIS — I455 Other specified heart block: Secondary | ICD-10-CM

## 2014-03-12 DIAGNOSIS — R5383 Other fatigue: Secondary | ICD-10-CM

## 2014-03-12 NOTE — Telephone Encounter (Signed)
Please review and let me know

## 2014-03-12 NOTE — Telephone Encounter (Signed)
Pease call,had a black out spell on Saturday.She has a loop recorder,this is her first episode since the recorder was put in.

## 2014-03-12 NOTE — Telephone Encounter (Signed)
Spoke with patient. She states she has had a "blackout episode" Saturday night approx 7:15pm which she marked on her loop recorder. Was told by Dr. Sallyanne Kuster to notify us when this happens. She states "a strange feeling" and being aware during the event, denies LOC. Denies out of the ordinary symptoms. She also noted another event that she marked recently where she "felt like there was a lot of activity going on in my chest". Will pass to Dr. Sallyanne Kuster for review.

## 2014-03-13 ENCOUNTER — Encounter: Payer: Self-pay | Admitting: Cardiovascular Disease

## 2014-03-13 ENCOUNTER — Telehealth: Payer: Self-pay | Admitting: Cardiovascular Disease

## 2014-03-13 DIAGNOSIS — H40013 Open angle with borderline findings, low risk, bilateral: Secondary | ICD-10-CM | POA: Diagnosis not present

## 2014-03-13 DIAGNOSIS — H02831 Dermatochalasis of right upper eyelid: Secondary | ICD-10-CM | POA: Diagnosis not present

## 2014-03-13 DIAGNOSIS — Z79899 Other long term (current) drug therapy: Secondary | ICD-10-CM | POA: Diagnosis not present

## 2014-03-13 DIAGNOSIS — R5383 Other fatigue: Secondary | ICD-10-CM | POA: Diagnosis not present

## 2014-03-13 DIAGNOSIS — H02834 Dermatochalasis of left upper eyelid: Secondary | ICD-10-CM | POA: Diagnosis not present

## 2014-03-13 DIAGNOSIS — Z7901 Long term (current) use of anticoagulants: Secondary | ICD-10-CM | POA: Diagnosis not present

## 2014-03-13 DIAGNOSIS — H04123 Dry eye syndrome of bilateral lacrimal glands: Secondary | ICD-10-CM | POA: Diagnosis not present

## 2014-03-13 DIAGNOSIS — Z961 Presence of intraocular lens: Secondary | ICD-10-CM | POA: Diagnosis not present

## 2014-03-13 NOTE — Telephone Encounter (Signed)
Dr.Croitoru spoke to pt via telephone regarding recent pause episode (from12-5) and discussed pacemaker implantation. Decreased Metoprolol to 25mg  daily. Discussed risks of AF/RVR while temporarily decreasing this medication. Risks and benefits of pacemaker implantation/loop explantation discussed over telephone. Patient agreed to proceed. Patient needs to hold Eliquis x 24hours prior to procedure.

## 2014-03-13 NOTE — Telephone Encounter (Signed)
Loop event showed sinus pause - assessed by Dr. Loletha Grayer - needs a permanent pacemaker.  He discussed this with the patient in great detail and it was decided to proceed and set up for Tuesday 03/20/14.  Order placed and given to scheduling who will call patient with detail.

## 2014-03-13 NOTE — Telephone Encounter (Signed)
Kelsey Joseph has evidence of tachycardia-bradycardia syndrome with paroxysmal atrial tachycardia/paroxysmal atrial fibrillation as well as sinus node arrest with pauses up to 5 seconds resulting in syncope. Will require dual-chamber permanent pacemaker implantation.  The risks and benefits of this procedure were discussed in detail with Kelsey Joseph, including possible conscious sedation and potential complications of lead dislodgment, need for reoperation, pneumothorax, infection and perforation. She understands and agrees to proceed. Plan to explant the loop recorder at the same time. An overnight stay will be required, and she will need a little time to make arrangements for somebody to assist her husband, for which she is the primary caregiver. Procedure will be scheduled for next Tuesday. She will take her last dose of anticoagulant on Sunday evening. Meanwhile decrease the dose of metoprolol to only 25 mg twice a day.  Sanda Klein, MD, [facility] [phone] office [phone] pager

## 2014-03-14 LAB — CBC
HEMATOCRIT: 40.1 % (ref 36.0–46.0)
Hemoglobin: 13.3 g/dL (ref 12.0–15.0)
MCH: 30.9 pg (ref 26.0–34.0)
MCHC: 33.2 g/dL (ref 30.0–36.0)
MCV: 93 fL (ref 78.0–100.0)
MPV: 10.4 fL (ref 9.4–12.4)
Platelets: 253 10*3/uL (ref 150–400)
RBC: 4.31 MIL/uL (ref 3.87–5.11)
RDW: 13.2 % (ref 11.5–15.5)
WBC: 7 10*3/uL (ref 4.0–10.5)

## 2014-03-14 LAB — COMPREHENSIVE METABOLIC PANEL
ALT: 20 U/L (ref 0–35)
AST: 22 U/L (ref 0–37)
Albumin: 4.3 g/dL (ref 3.5–5.2)
Alkaline Phosphatase: 81 U/L (ref 39–117)
BILIRUBIN TOTAL: 0.5 mg/dL (ref 0.2–1.2)
BUN: 30 mg/dL — ABNORMAL HIGH (ref 6–23)
CALCIUM: 9.6 mg/dL (ref 8.4–10.5)
CO2: 29 meq/L (ref 19–32)
CREATININE: 0.84 mg/dL (ref 0.50–1.10)
Chloride: 101 mEq/L (ref 96–112)
Glucose, Bld: 142 mg/dL — ABNORMAL HIGH (ref 70–99)
Potassium: 4.1 mEq/L (ref 3.5–5.3)
Sodium: 139 mEq/L (ref 135–145)
Total Protein: 7 g/dL (ref 6.0–8.3)

## 2014-03-15 ENCOUNTER — Encounter (HOSPITAL_COMMUNITY): Payer: Self-pay | Admitting: Cardiovascular Disease

## 2014-03-15 LAB — MDC_IDC_ENUM_SESS_TYPE_REMOTE
Date Time Interrogation Session: 20151121063040
MDC IDC SET ZONE DETECTION INTERVAL: 3000 ms
Zone Setting Detection Interval: 2000 ms
Zone Setting Detection Interval: 410 ms

## 2014-03-15 LAB — APTT: aPTT: 34 seconds (ref 24–37)

## 2014-03-15 LAB — PROTIME-INR
INR: 1.11 (ref <1.50)
Prothrombin Time: 14.3 seconds (ref 11.6–15.2)

## 2014-03-16 ENCOUNTER — Other Ambulatory Visit: Payer: Self-pay | Admitting: *Deleted

## 2014-03-16 ENCOUNTER — Encounter: Payer: Self-pay | Admitting: Cardiovascular Disease

## 2014-03-16 DIAGNOSIS — I455 Other specified heart block: Secondary | ICD-10-CM

## 2014-03-19 DIAGNOSIS — K219 Gastro-esophageal reflux disease without esophagitis: Secondary | ICD-10-CM | POA: Diagnosis not present

## 2014-03-19 DIAGNOSIS — E039 Hypothyroidism, unspecified: Secondary | ICD-10-CM | POA: Diagnosis not present

## 2014-03-19 DIAGNOSIS — I495 Sick sinus syndrome: Secondary | ICD-10-CM | POA: Diagnosis not present

## 2014-03-19 DIAGNOSIS — F419 Anxiety disorder, unspecified: Secondary | ICD-10-CM | POA: Diagnosis not present

## 2014-03-19 DIAGNOSIS — Z89619 Acquired absence of unspecified leg above knee: Secondary | ICD-10-CM | POA: Diagnosis not present

## 2014-03-19 DIAGNOSIS — R55 Syncope and collapse: Secondary | ICD-10-CM | POA: Diagnosis present

## 2014-03-19 DIAGNOSIS — Z882 Allergy status to sulfonamides status: Secondary | ICD-10-CM | POA: Diagnosis not present

## 2014-03-19 DIAGNOSIS — E785 Hyperlipidemia, unspecified: Secondary | ICD-10-CM | POA: Diagnosis not present

## 2014-03-19 DIAGNOSIS — I10 Essential (primary) hypertension: Secondary | ICD-10-CM | POA: Diagnosis not present

## 2014-03-19 DIAGNOSIS — Z87828 Personal history of other (healed) physical injury and trauma: Secondary | ICD-10-CM | POA: Diagnosis not present

## 2014-03-19 DIAGNOSIS — I455 Other specified heart block: Secondary | ICD-10-CM | POA: Diagnosis not present

## 2014-03-19 DIAGNOSIS — I480 Paroxysmal atrial fibrillation: Secondary | ICD-10-CM | POA: Diagnosis not present

## 2014-03-19 DIAGNOSIS — I471 Supraventricular tachycardia: Secondary | ICD-10-CM | POA: Diagnosis not present

## 2014-03-19 DIAGNOSIS — G4730 Sleep apnea, unspecified: Secondary | ICD-10-CM | POA: Diagnosis not present

## 2014-03-19 DIAGNOSIS — Z7901 Long term (current) use of anticoagulants: Secondary | ICD-10-CM | POA: Diagnosis not present

## 2014-03-19 DIAGNOSIS — I2510 Atherosclerotic heart disease of native coronary artery without angina pectoris: Secondary | ICD-10-CM | POA: Diagnosis not present

## 2014-03-19 DIAGNOSIS — I4892 Unspecified atrial flutter: Secondary | ICD-10-CM | POA: Diagnosis not present

## 2014-03-19 MED ORDER — SODIUM CHLORIDE 0.9 % IR SOLN
80.0000 mg | Status: DC
  Filled 2014-03-19: qty 2

## 2014-03-19 MED ORDER — CEFAZOLIN SODIUM-DEXTROSE 2-3 GM-% IV SOLR: 2.0000 g | INTRAVENOUS | Status: DC

## 2014-03-19 MED ORDER — SODIUM CHLORIDE 0.9 % IJ SOLN: 3.0000 mL | INTRAMUSCULAR | Status: DC | PRN

## 2014-03-19 MED ORDER — SODIUM CHLORIDE 0.9 % IV SOLN
INTRAVENOUS | Status: DC
  Administered 2014-03-20: 15:00:00 via INTRAVENOUS

## 2014-03-20 ENCOUNTER — Ambulatory Visit (HOSPITAL_COMMUNITY)
Admission: RE | Admit: 2014-03-20 | Discharge: 2014-03-21 | Disposition: A | Discharge status: Home / Self Care | Payer: Medicare Other | Source: Ambulatory Visit | Attending: Cardiovascular Disease | Admitting: Cardiovascular Disease

## 2014-03-20 ENCOUNTER — Encounter (HOSPITAL_COMMUNITY): Payer: Self-pay | Admitting: Cardiovascular Disease

## 2014-03-20 ENCOUNTER — Encounter (HOSPITAL_COMMUNITY)
Admission: RE | Disposition: A | Discharge status: Home / Self Care | Payer: Self-pay | Source: Ambulatory Visit | Attending: Cardiovascular Disease

## 2014-03-20 DIAGNOSIS — G4730 Sleep apnea, unspecified: Secondary | ICD-10-CM | POA: Insufficient documentation

## 2014-03-20 DIAGNOSIS — I495 Sick sinus syndrome: Secondary | ICD-10-CM | POA: Diagnosis not present

## 2014-03-20 DIAGNOSIS — I2510 Atherosclerotic heart disease of native coronary artery without angina pectoris: Secondary | ICD-10-CM | POA: Insufficient documentation

## 2014-03-20 DIAGNOSIS — E785 Hyperlipidemia, unspecified: Secondary | ICD-10-CM | POA: Diagnosis not present

## 2014-03-20 DIAGNOSIS — I480 Paroxysmal atrial fibrillation: Secondary | ICD-10-CM | POA: Diagnosis not present

## 2014-03-20 DIAGNOSIS — I471 Supraventricular tachycardia: Secondary | ICD-10-CM | POA: Insufficient documentation

## 2014-03-20 DIAGNOSIS — Z89619 Acquired absence of unspecified leg above knee: Secondary | ICD-10-CM | POA: Insufficient documentation

## 2014-03-20 DIAGNOSIS — I455 Other specified heart block: Secondary | ICD-10-CM | POA: Insufficient documentation

## 2014-03-20 DIAGNOSIS — Z87828 Personal history of other (healed) physical injury and trauma: Secondary | ICD-10-CM | POA: Insufficient documentation

## 2014-03-20 DIAGNOSIS — Z950 Presence of cardiac pacemaker: Secondary | ICD-10-CM

## 2014-03-20 DIAGNOSIS — I4891 Unspecified atrial fibrillation: Secondary | ICD-10-CM | POA: Diagnosis present

## 2014-03-20 DIAGNOSIS — Z7901 Long term (current) use of anticoagulants: Secondary | ICD-10-CM | POA: Insufficient documentation

## 2014-03-20 DIAGNOSIS — I4892 Unspecified atrial flutter: Secondary | ICD-10-CM | POA: Insufficient documentation

## 2014-03-20 DIAGNOSIS — E039 Hypothyroidism, unspecified: Secondary | ICD-10-CM | POA: Insufficient documentation

## 2014-03-20 DIAGNOSIS — R55 Syncope and collapse: Secondary | ICD-10-CM | POA: Diagnosis present

## 2014-03-20 DIAGNOSIS — Z882 Allergy status to sulfonamides status: Secondary | ICD-10-CM | POA: Insufficient documentation

## 2014-03-20 DIAGNOSIS — F419 Anxiety disorder, unspecified: Secondary | ICD-10-CM | POA: Insufficient documentation

## 2014-03-20 DIAGNOSIS — I10 Essential (primary) hypertension: Secondary | ICD-10-CM | POA: Insufficient documentation

## 2014-03-20 DIAGNOSIS — K219 Gastro-esophageal reflux disease without esophagitis: Secondary | ICD-10-CM | POA: Insufficient documentation

## 2014-03-20 HISTORY — DX: Other specified heart block: I45.5

## 2014-03-20 HISTORY — PX: PERMANENT PACEMAKER INSERTION: SHX5480

## 2014-03-20 HISTORY — DX: Unspecified atrial flutter: I48.92

## 2014-03-20 HISTORY — PX: LOOP RECORDER EXPLANT: SHX5476

## 2014-03-20 HISTORY — DX: Paroxysmal atrial fibrillation: I48.0

## 2014-03-20 LAB — SURGICAL PCR SCREEN
MRSA, PCR: NEGATIVE
Staphylococcus aureus: NEGATIVE

## 2014-03-20 SURGERY — LOOP RECORDER EXPLANT
Anesthesia: LOCAL

## 2014-03-20 MED ORDER — ZOLPIDEM TARTRATE 5 MG PO TABS
2.5000 mg | ORAL_TABLET | Freq: Every evening | ORAL | Status: DC | PRN
  Administered 2014-03-20: 2.5 mg via ORAL
  Filled 2014-03-20: qty 1

## 2014-03-20 MED ORDER — MONTELUKAST SODIUM 10 MG PO TABS
10.0000 mg | ORAL_TABLET | Freq: Every day | ORAL | Status: DC
  Administered 2014-03-21: 11:00:00 10 mg via ORAL
  Filled 2014-03-20: qty 1

## 2014-03-20 MED ORDER — AMLODIPINE BESYLATE 5 MG PO TABS
5.0000 mg | ORAL_TABLET | Freq: Every day | ORAL | Status: DC
  Filled 2014-03-20: qty 1

## 2014-03-20 MED ORDER — MIDAZOLAM HCL 5 MG/5ML IJ SOLN
INTRAMUSCULAR | Status: AC
  Filled 2014-03-20: qty 5

## 2014-03-20 MED ORDER — CYCLOBENZAPRINE HCL 10 MG PO TABS: 5.0000 mg | ORAL_TABLET | Freq: Two times a day (BID) | ORAL | Status: DC | PRN

## 2014-03-20 MED ORDER — HEPARIN (PORCINE) IN NACL 2-0.9 UNIT/ML-% IJ SOLN
INTRAMUSCULAR | Status: AC
  Filled 2014-03-20: qty 500

## 2014-03-20 MED ORDER — MUPIROCIN 2 % EX OINT
1.0000 "application " | TOPICAL_OINTMENT | Freq: Once | CUTANEOUS | Status: AC
  Administered 2014-03-20: 1 via TOPICAL

## 2014-03-20 MED ORDER — FENTANYL CITRATE 0.05 MG/ML IJ SOLN
INTRAMUSCULAR | Status: AC
  Filled 2014-03-20: qty 2

## 2014-03-20 MED ORDER — MUPIROCIN 2 % EX OINT
TOPICAL_OINTMENT | CUTANEOUS | Status: AC
  Administered 2014-03-20: 1 via TOPICAL
  Filled 2014-03-20: qty 22

## 2014-03-20 MED ORDER — SIMVASTATIN 20 MG PO TABS
20.0000 mg | ORAL_TABLET | Freq: Every day | ORAL | Status: DC
  Administered 2014-03-20: 22:00:00 20 mg via ORAL
  Filled 2014-03-20 (×2): qty 1

## 2014-03-20 MED ORDER — IRBESARTAN 300 MG PO TABS
300.0000 mg | ORAL_TABLET | Freq: Every day | ORAL | Status: DC
  Administered 2014-03-20: 300 mg via ORAL
  Filled 2014-03-20 (×2): qty 1

## 2014-03-20 MED ORDER — ESTRADIOL 1 MG PO TABS
0.5000 mg | ORAL_TABLET | Freq: Every evening | ORAL | Status: DC
  Administered 2014-03-20: 22:00:00 0.5 mg via ORAL
  Filled 2014-03-20 (×3): qty 0.5

## 2014-03-20 MED ORDER — METOPROLOL TARTRATE 50 MG PO TABS
75.0000 mg | ORAL_TABLET | Freq: Two times a day (BID) | ORAL | Status: DC
  Administered 2014-03-20: 75 mg via ORAL
  Filled 2014-03-20 (×3): qty 1

## 2014-03-20 MED ORDER — TORSEMIDE 20 MG PO TABS
20.0000 mg | ORAL_TABLET | Freq: Every day | ORAL | Status: DC
Start: 2014-03-21 — End: 2014-03-21
  Filled 2014-03-20: qty 1

## 2014-03-20 MED ORDER — LEVOTHYROXINE SODIUM 50 MCG PO TABS
50.0000 ug | ORAL_TABLET | ORAL | Status: DC
  Administered 2014-03-21: 09:00:00 50 ug via ORAL
  Filled 2014-03-20: qty 1

## 2014-03-20 MED ORDER — YOU HAVE A PACEMAKER BOOK
Freq: Once | Status: AC
  Administered 2014-03-20: 22:00:00
  Filled 2014-03-20: qty 1

## 2014-03-20 MED ORDER — ONDANSETRON HCL 4 MG/2ML IJ SOLN: 4.0000 mg | Freq: Four times a day (QID) | INTRAMUSCULAR | Status: DC | PRN

## 2014-03-20 MED ORDER — ACETAMINOPHEN 325 MG PO TABS: 325.0000 mg | ORAL_TABLET | ORAL | Status: DC | PRN

## 2014-03-20 MED ORDER — HYDROCODONE-ACETAMINOPHEN 5-325 MG PO TABS
1.0000 | ORAL_TABLET | ORAL | Status: DC | PRN
  Administered 2014-03-20: 1 via ORAL
  Administered 2014-03-21: 03:00:00 2 via ORAL
  Filled 2014-03-20 (×3): qty 1

## 2014-03-20 MED ORDER — CEFAZOLIN SODIUM 1-5 GM-% IV SOLN
1.0000 g | Freq: Four times a day (QID) | INTRAVENOUS | Status: AC
  Administered 2014-03-20 – 2014-03-21 (×3): 1 g via INTRAVENOUS
  Filled 2014-03-20 (×3): qty 50

## 2014-03-20 MED ORDER — MELOXICAM 7.5 MG PO TABS
7.5000 mg | ORAL_TABLET | Freq: Every day | ORAL | Status: DC
  Filled 2014-03-20: qty 1

## 2014-03-20 MED ORDER — LEVOTHYROXINE SODIUM 100 MCG PO TABS
100.0000 ug | ORAL_TABLET | ORAL | Status: DC
  Filled 2014-03-20: qty 1

## 2014-03-20 MED ORDER — TRAMADOL HCL 50 MG PO TABS
50.0000 mg | ORAL_TABLET | Freq: Four times a day (QID) | ORAL | Status: DC | PRN
Start: 2014-03-20 — End: 2014-03-21

## 2014-03-20 MED ORDER — CYCLOSPORINE 0.05 % OP EMUL
1.0000 [drp] | Freq: Two times a day (BID) | OPHTHALMIC | Status: DC
  Administered 2014-03-20: 1 [drp] via OPHTHALMIC
  Filled 2014-03-20 (×3): qty 1

## 2014-03-20 MED ORDER — LIDOCAINE HCL (PF) 1 % IJ SOLN
INTRAMUSCULAR | Status: AC
  Filled 2014-03-20: qty 60

## 2014-03-20 MED ORDER — CEFAZOLIN SODIUM-DEXTROSE 2-3 GM-% IV SOLR
INTRAVENOUS | Status: AC
  Filled 2014-03-20: qty 50

## 2014-03-20 MED ORDER — PANTOPRAZOLE SODIUM 40 MG PO TBEC
40.0000 mg | DELAYED_RELEASE_TABLET | Freq: Every day | ORAL | Status: DC
  Administered 2014-03-21: 13:00:00 40 mg via ORAL
  Filled 2014-03-20: qty 1
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
## 2014-03-20 NOTE — Op Note (Signed)
Procedure report  Procedure performed:  1. Implantation of new dual chamber permanent pacemaker  2. Fluoroscopy  3. Light sedation  Reason for procedure: Symptomatic bradycardia (syncope) due to: Sinus node dysfunction Sinus arrest  Procedure performed by: Sanda Klein, MD  Complications: None  Estimated blood loss: <10 mL  Medications administered during procedure: Ancef 1 g intravenously Vancomycin 1 g intravenously Lidocaine 1% 30 mL locally,  Fentanyl 25 mcg intravenously Versed 1 mg intravenously  Device details: Generator Medtronic Advisa DR MRI model [id number] serial number [id number] H Right atrial lead Medtronic [id number] serial number [id number] Right ventricular lead Medtronic [id number] serial number [id number]  Procedure details:  After the risks and benefits of the procedure were discussed the patient provided informed consent and was brought to the cardiac cath lab in the fasting state. The patient was prepped and draped in usual sterile fashion. Local anesthesia with 1% lidocaine was administered to to the left infraclavicular area. A 5-6 cm horizontal incision was made parallel with and 2-3 cm caudal to the left clavicle. Using electrocautery and blunt dissection a prepectoral pocket was created down to the level of the pectoralis major muscle fascia. The pocket was carefully inspected for hemostasis. An antibiotic-soaked sponge was placed in the pocket.  Under fluoroscopic guidance and using the modified Seldinger technique 2 separate venipunctures were performed to access the left subclavian vein. No difficulty was encountered accessing the vein.  Two J-tip guidewires were subsequently exchanged for two 7 French safe sheaths.  Under fluoroscopic guidance the ventricular lead was advanced to level of the mid to apical right ventricular septum and thet active-fixation helix was deployed. Prominent current of injury was seen. Satisfactory pacing and sensing  parameters were recorded. There was no evidence of diaphragmatic stimulation at maximum device output. The safe sheath was peeled away and the lead was secured in place with 2-0 silk.  In similar fashion the right atrial lead was advanced to the level of the atrial appendage. The active-fixation helix was deployed. There was prominent current of injury. Satisfactory   sensing parameters were recorded, but threshold could not be tested due to atrial flutter. There was no evidence of diaphragmatic stimulation with pacing at maximum device output. The safe sheath was peeled away and the lead was secured in place with 2-0 silk.  The antibiotic-soaked sponge was removed from the pocket. The pocket was flushed with copious amounts of antibiotic solution. Reinspection showed excellent hemostasis..  The ventricular lead was connected to the generator and appropriate ventricular pacing was seen. Subsequently the atrial lead was also connected. Repeat testing of the lead parameters later showed excellent values.  The entire system was then carefully inserted in the pocket with care been taking that the leads and device assumed a comfortable position without pressure on the incision. Great care was taken that the leads be located deep to the generator. The pocket was then closed in layers using 2 layers of 2-0 Vicryl and cutaneous staples, after which a sterile dressing was applied.  At the end of the procedure the following lead parameters were encountered:  Right atrial lead sensed P waves 4.5 mV (atrial flutter), threshold could not be tested. Right ventricular lead sensed R waves 15 mV, impedance 1089ohms, threshold 0.8 V at 0.5 ms pulse width.  The left parasternal area was re-prepped and draped. Local anesthetic was administered to the area of the loop recorder. A 1 cm incision was performed and the device was easily located and removed. The site was closed with  2-0 Vicryl and two cutaneous  staples.   Sanda Klein, MD, [facility] [phone] office [phone] pager

## 2014-03-21 ENCOUNTER — Encounter (HOSPITAL_COMMUNITY): Payer: Self-pay | Admitting: Physician Assistant

## 2014-03-21 ENCOUNTER — Ambulatory Visit (HOSPITAL_COMMUNITY): Payer: Medicare Other

## 2014-03-21 DIAGNOSIS — E785 Hyperlipidemia, unspecified: Secondary | ICD-10-CM | POA: Diagnosis not present

## 2014-03-21 DIAGNOSIS — I10 Essential (primary) hypertension: Secondary | ICD-10-CM | POA: Diagnosis present

## 2014-03-21 DIAGNOSIS — I455 Other specified heart block: Secondary | ICD-10-CM | POA: Diagnosis not present

## 2014-03-21 DIAGNOSIS — I471 Supraventricular tachycardia: Secondary | ICD-10-CM | POA: Diagnosis not present

## 2014-03-21 DIAGNOSIS — I4892 Unspecified atrial flutter: Secondary | ICD-10-CM | POA: Diagnosis present

## 2014-03-21 DIAGNOSIS — I480 Paroxysmal atrial fibrillation: Secondary | ICD-10-CM | POA: Diagnosis not present

## 2014-03-21 DIAGNOSIS — I495 Sick sinus syndrome: Secondary | ICD-10-CM | POA: Diagnosis not present

## 2014-03-21 DIAGNOSIS — Z45018 Encounter for adjustment and management of other part of cardiac pacemaker: Secondary | ICD-10-CM | POA: Diagnosis not present

## 2014-03-21 MED ORDER — METOPROLOL TARTRATE 100 MG PO TABS: 100.0000 mg | ORAL_TABLET | Freq: Two times a day (BID) | ORAL | Status: DC

## 2014-03-21 MED ORDER — METOPROLOL TARTRATE 100 MG PO TABS
100.0000 mg | ORAL_TABLET | Freq: Two times a day (BID) | ORAL | Status: DC
Start: 2014-03-21 — End: 2014-03-21
  Administered 2014-03-21: 11:00:00 100 mg via ORAL
  Filled 2014-03-21 (×2): qty 1
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
## 2014-03-21 NOTE — Discharge Instructions (Signed)
Supplemental Discharge Instructions for  Pacemaker Patients   ACTIVITY  Do not raise your left/right arm above shoulder level or extend it backward beyond shoulder level for 2 weeks. Wear the arm sling as a reminder or as needed for comfort for 2 weeks. No heavy lifting or vigorous activity with your left/right arm for 6-8 weeks.   NO DRIVING for  2 weeks. DO wear your seatbelt, even if it crosses over the pacemaker site.   WOUND CARE  Keep the wound area clean and dry. Remove the dressing the day after you return home (usually 48 hours after the procedure).  DO NOT SUBMERGE UNDER WATER UNTIL FULLY HEALED (no tub baths, hot tubs, swimming pools, etc.).  You may shower or take a sponge bath after the dressing is removed. DO NOT SOAK the area and do not allow the shower to directly spray on the site.  If you have staples, these will be removed in the office in 7-14 days.  If you have tape/steri-strips on your wound, these will fall off; do not pull them off prematurely.  No bandage is needed on the site. DO NOT apply any creams, oils, or ointments to the wound area.  If you notice any drainage or discharge from the wound, any swelling, excessive redness or bruising at the site, or if you develop a fever > 101? F after you are discharged home, call the office at once.  SPECIAL INSTRUCTIONS  You are still able to use cellular telephones. Avoid carrying your cellular phone near your device.  When traveling through airports, show security personnel your identification card to avoid being screened in the metal detectors.  Avoid arc welding equipment, MRI testing (magnetic resonance imaging), TENS units (transcutaneous nerve stimulators). Call the office for questions about other devices.  Avoid electrical appliances that are in poor condition or are not properly grounded.  Microwave ovens are safe to be near or to operate.

## 2014-03-22 ENCOUNTER — Encounter: Payer: Self-pay | Admitting: Cardiovascular Disease

## 2014-03-23 ENCOUNTER — Other Ambulatory Visit: Payer: Self-pay | Admitting: *Deleted

## 2014-03-23 MED ORDER — METOPROLOL TARTRATE 100 MG PO TABS: 100.0000 mg | ORAL_TABLET | Freq: Two times a day (BID) | ORAL | Status: DC

## 2014-04-02 ENCOUNTER — Ambulatory Visit (INDEPENDENT_AMBULATORY_CARE_PROVIDER_SITE_OTHER): Payer: Medicare Other | Admitting: *Deleted

## 2014-04-02 DIAGNOSIS — I4891 Unspecified atrial fibrillation: Secondary | ICD-10-CM

## 2014-04-02 DIAGNOSIS — I455 Other specified heart block: Secondary | ICD-10-CM | POA: Diagnosis not present

## 2014-04-02 LAB — MDC_IDC_ENUM_SESS_TYPE_INCLINIC
Brady Statistic AS VP Percent: 1.55 %
Brady Statistic AS VS Percent: 98.45 %
Date Time Interrogation Session: 20151228141356
Lead Channel Impedance Value: 456 Ohm
Lead Channel Impedance Value: 475 Ohm
Lead Channel Impedance Value: 513 Ohm
Lead Channel Impedance Value: 608 Ohm
Lead Channel Pacing Threshold Amplitude: 0.75 V
Lead Channel Pacing Threshold Pulse Width: 0.4 ms
Lead Channel Sensing Intrinsic Amplitude: 2 mV
Lead Channel Setting Pacing Amplitude: 3.5 V
Lead Channel Setting Pacing Amplitude: 3.5 V
Lead Channel Setting Pacing Pulse Width: 0.4 ms
MDC IDC MSMT BATTERY VOLTAGE: 3.09 V
MDC IDC MSMT LEADCHNL RA SENSING INTR AMPL: 1.5 mV
MDC IDC MSMT LEADCHNL RV SENSING INTR AMPL: 19.875 mV
MDC IDC MSMT LEADCHNL RV SENSING INTR AMPL: 20.375 mV
MDC IDC SET LEADCHNL RV SENSING SENSITIVITY: 2 mV
MDC IDC SET ZONE DETECTION INTERVAL: 400 ms
MDC IDC SET ZONE DETECTION INTERVAL: 400 ms
MDC IDC STAT BRADY AP VP PERCENT: 0 %
MDC IDC STAT BRADY AP VS PERCENT: 0 %
MDC IDC STAT BRADY RA PERCENT PACED: 0 %
MDC IDC STAT BRADY RV PERCENT PACED: 1.55 %

## 2014-04-02 NOTE — Progress Notes (Signed)
Wound check appointment. Staples removed. Wound without redness or edema. Incision edges approximated, wound well healed. Normal device function. Threshold, sensing, and impedances consistent with implant measurements. Device programmed at 3.5V on for extra safety margin until 3 month visit. Histogram distribution appropriate for patient and level of activity. Persistant AFL since at least 12-16 + Eliquis---pt w/o sx's. No high ventricular rates noted. Patient educated about wound care, arm mobility, lifting restrictions. ROV in 3 months with [facility] on 2-9.

## 2014-04-12 ENCOUNTER — Encounter: Payer: Self-pay | Admitting: Cardiology

## 2014-04-12 ENCOUNTER — Ambulatory Visit (INDEPENDENT_AMBULATORY_CARE_PROVIDER_SITE_OTHER): Payer: Medicare Other | Admitting: Cardiology

## 2014-04-12 ENCOUNTER — Encounter: Payer: Self-pay | Admitting: Cardiovascular Disease

## 2014-04-12 VITALS — BP 152/76 | HR 100 | Ht 63.5 in | Wt 132.0 lb

## 2014-04-12 DIAGNOSIS — Z89621 Acquired absence of right hip joint: Secondary | ICD-10-CM | POA: Diagnosis not present

## 2014-04-12 DIAGNOSIS — Z950 Presence of cardiac pacemaker: Secondary | ICD-10-CM | POA: Diagnosis not present

## 2014-04-12 DIAGNOSIS — I4891 Unspecified atrial fibrillation: Secondary | ICD-10-CM | POA: Diagnosis not present

## 2014-04-12 DIAGNOSIS — Z7901 Long term (current) use of anticoagulants: Secondary | ICD-10-CM | POA: Diagnosis not present

## 2014-04-12 DIAGNOSIS — I455 Other specified heart block: Secondary | ICD-10-CM | POA: Diagnosis not present

## 2014-04-12 DIAGNOSIS — I10 Essential (primary) hypertension: Secondary | ICD-10-CM

## 2014-04-12 DIAGNOSIS — Z89629 Acquired absence of unspecified hip joint: Secondary | ICD-10-CM | POA: Insufficient documentation

## 2014-04-12 MED ORDER — DILTIAZEM HCL ER COATED BEADS 180 MG PO CP24: 180.0000 mg | ORAL_CAPSULE | Freq: Every day | ORAL | Status: DC

## 2014-04-12 MED ORDER — DILTIAZEM HCL ER COATED BEADS 180 MG PO CP24
180.0000 mg | ORAL_CAPSULE | Freq: Every day | ORAL | Status: DC
Start: 2014-04-12 — End: 2014-05-15

## 2014-04-12 NOTE — Assessment & Plan Note (Signed)
Elevated B/p since her pacemaker- 170/90

## 2014-04-12 NOTE — Assessment & Plan Note (Signed)
Appears to be in AF with VR 100

## 2014-04-12 NOTE — Patient Instructions (Signed)
START Diltiazem 180mg  daily.  Keep your appointment in February with Dr. Sallyanne Kuster.

## 2014-04-12 NOTE — Assessment & Plan Note (Signed)
MDT PTVDP Dec 2015

## 2014-04-12 NOTE — Assessment & Plan Note (Signed)
S/P pacemaker
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
## 2014-04-13 ENCOUNTER — Other Ambulatory Visit: Payer: Self-pay | Admitting: *Deleted

## 2014-04-13 ENCOUNTER — Encounter: Payer: Self-pay | Admitting: Cardiovascular Disease

## 2014-04-13 MED ORDER — ROSUVASTATIN CALCIUM 10 MG PO TABS: 10.0000 mg | ORAL_TABLET | Freq: Every day | ORAL | Status: DC

## 2014-04-16 ENCOUNTER — Encounter: Payer: Medicare Other | Admitting: Cardiovascular Disease

## 2014-04-17 ENCOUNTER — Encounter: Payer: Self-pay | Admitting: Cardiovascular Disease

## 2014-04-18 ENCOUNTER — Telehealth: Payer: Self-pay | Admitting: Cardiovascular Disease

## 2014-04-18 ENCOUNTER — Other Ambulatory Visit (HOSPITAL_COMMUNITY): Payer: Self-pay | Admitting: Cardiovascular Disease

## 2014-04-18 DIAGNOSIS — R0602 Shortness of breath: Secondary | ICD-10-CM

## 2014-04-18 NOTE — Telephone Encounter (Signed)
[facility] remote shows pt in A-flutter 95.4% of time since 04/02/14. Pt's current EGM shows Atrial preference pacing w/ intrinsic R-waves.

## 2014-04-18 NOTE — Telephone Encounter (Signed)
I think she already has a remote device for pacemaker download, please ask her to do an ad hoc device download today. Reschedule for echocardiogram ASAP, shortness of breath-rule out pericardial effusion.

## 2014-04-18 NOTE — Telephone Encounter (Signed)
Spoke to patient. She understands we want a device download today. Asks if similar to loop recorder download, I am unfamiliar and will defer to [facility]. Told pt to anticipate a call.  Also will send message to scheduling to get pt in for echo.

## 2014-04-18 NOTE — Telephone Encounter (Signed)
Spoke w/ patient. She states shortness of breath ongoing x 1 week+, (since Jan 4th). Worse in last couple days, but she was seen on 7th by Kerin Ransom.  She went to [facility] to exercise this AM, was unable to do so. States sensation of not being able to take a full breath. States LE does not feel/look edematous, but she has facial puffiness & tightness. States recent weight gain, 3-4 lbs this week.  BP readings have been high. 173/88, 167/88, 151/90. She had one reading of 124/67 but states that is the lowest it has been for several days.   She had pacemaker implanted Dec 15th. Was started on Cardizem 180mg  on 1/7.  She is on Torsemide 20mg  daily.

## 2014-04-18 NOTE — Telephone Encounter (Signed)
Suspect the sustained arrhythmia is the cause for dyspnea. Let's get echo, but may have to discuss cardioversion. Can we please schedule office visit with PA/NP or one of my colleagues as soon as possible after echo.

## 2014-04-18 NOTE — Telephone Encounter (Signed)
FYI:  Pt called in stating that she sent in a reading for her device.

## 2014-04-19 ENCOUNTER — Encounter: Payer: Self-pay | Admitting: Cardiovascular Disease

## 2014-04-19 ENCOUNTER — Telehealth: Payer: Self-pay | Admitting: Cardiovascular Disease

## 2014-04-19 ENCOUNTER — Ambulatory Visit (HOSPITAL_COMMUNITY)
Admission: RE | Admit: 2014-04-19 | Discharge: 2014-04-19 | Disposition: A | Discharge status: Home / Self Care | Payer: Medicare Other | Source: Ambulatory Visit | Attending: Cardiology | Admitting: Cardiology

## 2014-04-19 DIAGNOSIS — I10 Essential (primary) hypertension: Secondary | ICD-10-CM | POA: Diagnosis not present

## 2014-04-19 DIAGNOSIS — R0602 Shortness of breath: Secondary | ICD-10-CM | POA: Diagnosis not present

## 2014-04-19 DIAGNOSIS — I059 Rheumatic mitral valve disease, unspecified: Secondary | ICD-10-CM

## 2014-04-19 NOTE — Progress Notes (Signed)
2D Echo Performed 04/19/2014    Marygrace Drought, RCS

## 2014-04-19 NOTE — Telephone Encounter (Signed)
Spoke to patient appointment scheduled with Tarri Fuller PA Monday 04/23/14 at 9:30 am.

## 2014-04-23 ENCOUNTER — Ambulatory Visit (INDEPENDENT_AMBULATORY_CARE_PROVIDER_SITE_OTHER): Payer: Medicare Other | Admitting: Physician Assistant

## 2014-04-23 ENCOUNTER — Encounter: Payer: Self-pay | Admitting: Physician Assistant

## 2014-04-23 VITALS — BP 126/70 | HR 108 | Ht 63.5 in | Wt 136.6 lb

## 2014-04-23 DIAGNOSIS — I10 Essential (primary) hypertension: Secondary | ICD-10-CM | POA: Diagnosis not present

## 2014-04-23 DIAGNOSIS — I4819 Other persistent atrial fibrillation: Secondary | ICD-10-CM

## 2014-04-23 DIAGNOSIS — I5032 Chronic diastolic (congestive) heart failure: Secondary | ICD-10-CM

## 2014-04-23 DIAGNOSIS — Z950 Presence of cardiac pacemaker: Secondary | ICD-10-CM

## 2014-04-23 DIAGNOSIS — Z7901 Long term (current) use of anticoagulants: Secondary | ICD-10-CM | POA: Diagnosis not present

## 2014-04-23 DIAGNOSIS — I480 Paroxysmal atrial fibrillation: Secondary | ICD-10-CM | POA: Diagnosis not present

## 2014-04-23 DIAGNOSIS — I481 Persistent atrial fibrillation: Secondary | ICD-10-CM
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
## 2014-04-23 NOTE — Patient Instructions (Signed)
Weigh yourself daily.  Call if you gain 3lbs in 1 day or 5lbs in 1 week   Follow up with Dr. Sallyanne Kuster May 15, 2014

## 2014-04-23 NOTE — Assessment & Plan Note (Signed)
Continue statin. 

## 2014-04-23 NOTE — Assessment & Plan Note (Signed)
Blood pressure much better.  Continue current medication dosing

## 2014-04-23 NOTE — Assessment & Plan Note (Signed)
Rate is mildly elevated this morning however the patient showed me recorded heart rate she brought from home and they have mostly under 100

## 2014-04-23 NOTE — Assessment & Plan Note (Signed)
She is currently in atrial fibrillation. Rate is mildly elevated at 108. Her heart rates that she charted and brought in are well-controlled. No changes in current dosing. She does have room on her diltiazem to increase it if we need to.

## 2014-04-23 NOTE — Assessment & Plan Note (Signed)
Decrease Demadex to previous dosing. Continue daily weight monitoring low-sodium diet. Patient will call for weight increases.

## 2014-04-24 NOTE — Telephone Encounter (Signed)
Closed encounter °

## 2014-05-15 ENCOUNTER — Ambulatory Visit (INDEPENDENT_AMBULATORY_CARE_PROVIDER_SITE_OTHER): Payer: Medicare Other | Admitting: Cardiovascular Disease

## 2014-05-15 ENCOUNTER — Encounter: Payer: Self-pay | Admitting: Cardiovascular Disease

## 2014-05-15 VITALS — BP 154/84 | HR 80 | Ht 63.0 in | Wt 132.0 lb

## 2014-05-15 DIAGNOSIS — I480 Paroxysmal atrial fibrillation: Secondary | ICD-10-CM

## 2014-05-15 DIAGNOSIS — E785 Hyperlipidemia, unspecified: Secondary | ICD-10-CM | POA: Diagnosis not present

## 2014-05-15 MED ORDER — PRAVASTATIN SODIUM 40 MG PO TABS: 40.0000 mg | ORAL_TABLET | Freq: Every evening | ORAL | Status: DC

## 2014-05-15 MED ORDER — DILTIAZEM HCL ER COATED BEADS 240 MG PO CP24: 240.0000 mg | ORAL_CAPSULE | Freq: Every day | ORAL | Status: DC

## 2014-05-15 NOTE — Patient Instructions (Addendum)
Your physician recommends that you schedule a follow-up appointment and labs  in: 3 months with Dr.Croitoru.  Your physician has recommended you make the following change in your medication: STOP the diltiazem 180 mg and start the new prescription for 240 mg. Stop the crestor and start the new prescription for pravastatin. These changes has already been sent to your pharmacy.  Kelsey Joseph
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
## 2014-05-17 LAB — MDC_IDC_ENUM_SESS_TYPE_REMOTE
Battery Remaining Longevity: 119 mo
Brady Statistic AP VP Percent: 0.85 %
Brady Statistic AP VS Percent: 37.1 %
Brady Statistic AS VS Percent: 52.7 %
Brady Statistic RA Percent Paced: 37.95 %
Date Time Interrogation Session: 20160209220638
Lead Channel Impedance Value: 513 Ohm
Lead Channel Pacing Threshold Amplitude: 0.625 V
Lead Channel Pacing Threshold Pulse Width: 0.4 ms
Lead Channel Pacing Threshold Pulse Width: 0.4 ms
Lead Channel Sensing Intrinsic Amplitude: 18 mV
Lead Channel Sensing Intrinsic Amplitude: 18 mV
Lead Channel Sensing Intrinsic Amplitude: 2.75 mV
Lead Channel Sensing Intrinsic Amplitude: 2.75 mV
Lead Channel Setting Pacing Amplitude: 3.5 V
MDC IDC MSMT BATTERY VOLTAGE: 3.05 V
MDC IDC MSMT LEADCHNL RA IMPEDANCE VALUE: 475 Ohm
MDC IDC MSMT LEADCHNL RA IMPEDANCE VALUE: 570 Ohm
MDC IDC MSMT LEADCHNL RV IMPEDANCE VALUE: 456 Ohm
MDC IDC MSMT LEADCHNL RV PACING THRESHOLD AMPLITUDE: 0.625 V
MDC IDC SET LEADCHNL RA PACING AMPLITUDE: 3.5 V
MDC IDC SET LEADCHNL RV PACING PULSEWIDTH: 0.4 ms
MDC IDC SET LEADCHNL RV SENSING SENSITIVITY: 2 mV
MDC IDC SET ZONE DETECTION INTERVAL: 400 ms
MDC IDC STAT BRADY AS VP PERCENT: 9.35 %
MDC IDC STAT BRADY RV PERCENT PACED: 10.2 %
Zone Setting Detection Interval: 400 ms

## 2014-05-22 ENCOUNTER — Encounter: Payer: Self-pay | Admitting: Cardiovascular Disease

## 2014-06-27 ENCOUNTER — Encounter: Payer: Self-pay | Admitting: Cardiovascular Disease

## 2014-06-28 ENCOUNTER — Telehealth: Payer: Self-pay | Admitting: *Deleted

## 2014-06-28 MED ORDER — DILTIAZEM HCL ER COATED BEADS 360 MG PO CP24: 360.0000 mg | ORAL_CAPSULE | Freq: Every day | ORAL | Status: DC

## 2014-06-28 NOTE — Telephone Encounter (Signed)
Patient notified to increase Diltiazem to 360mg  daily.  New Rx sent to [facility].

## 2014-06-28 NOTE — Telephone Encounter (Signed)
-----   Message from Sanda Klein, MD sent at 06/28/2014  8:38 AM EDT ----- Please increase diltiazem to 360 mg QD

## 2014-07-20 DIAGNOSIS — Z85828 Personal history of other malignant neoplasm of skin: Secondary | ICD-10-CM | POA: Diagnosis not present

## 2014-07-20 DIAGNOSIS — I788 Other diseases of capillaries: Secondary | ICD-10-CM | POA: Diagnosis not present

## 2014-07-20 DIAGNOSIS — D1801 Hemangioma of skin and subcutaneous tissue: Secondary | ICD-10-CM | POA: Diagnosis not present

## 2014-07-20 DIAGNOSIS — L578 Other skin changes due to chronic exposure to nonionizing radiation: Secondary | ICD-10-CM | POA: Diagnosis not present

## 2014-07-20 DIAGNOSIS — L821 Other seborrheic keratosis: Secondary | ICD-10-CM | POA: Diagnosis not present

## 2014-08-07 DIAGNOSIS — R202 Paresthesia of skin: Secondary | ICD-10-CM | POA: Diagnosis not present

## 2014-08-09 DIAGNOSIS — M25562 Pain in left knee: Secondary | ICD-10-CM | POA: Diagnosis not present

## 2014-08-09 DIAGNOSIS — Z96652 Presence of left artificial knee joint: Secondary | ICD-10-CM | POA: Diagnosis not present

## 2014-08-09 DIAGNOSIS — M7021 Olecranon bursitis, right elbow: Secondary | ICD-10-CM | POA: Diagnosis not present

## 2014-08-14 ENCOUNTER — Telehealth: Payer: Self-pay | Admitting: Cardiovascular Disease

## 2014-08-14 ENCOUNTER — Encounter: Payer: Self-pay | Admitting: Cardiovascular Disease

## 2014-08-14 ENCOUNTER — Ambulatory Visit (INDEPENDENT_AMBULATORY_CARE_PROVIDER_SITE_OTHER): Payer: Medicare Other | Admitting: Cardiovascular Disease

## 2014-08-14 VITALS — BP 127/81 | HR 95 | Ht 63.5 in

## 2014-08-14 DIAGNOSIS — I4891 Unspecified atrial fibrillation: Secondary | ICD-10-CM

## 2014-08-14 DIAGNOSIS — I455 Other specified heart block: Secondary | ICD-10-CM

## 2014-08-14 DIAGNOSIS — I471 Supraventricular tachycardia: Secondary | ICD-10-CM

## 2014-08-14 DIAGNOSIS — I480 Paroxysmal atrial fibrillation: Secondary | ICD-10-CM

## 2014-08-14 DIAGNOSIS — I4892 Unspecified atrial flutter: Secondary | ICD-10-CM

## 2014-08-14 DIAGNOSIS — I440 Atrioventricular block, first degree: Secondary | ICD-10-CM | POA: Diagnosis not present

## 2014-08-14 DIAGNOSIS — I4719 Other supraventricular tachycardia: Secondary | ICD-10-CM

## 2014-08-14 MED ORDER — IRBESARTAN 300 MG PO TABS: 300.0000 mg | ORAL_TABLET | Freq: Every day | ORAL | Status: DC

## 2014-08-14 NOTE — Telephone Encounter (Signed)
Pt called in wanting to inform Dr. Loletha Grayer that she is taking Meloxicam 7.5mg .   Thanks

## 2014-08-14 NOTE — Patient Instructions (Signed)
Your physician recommends that you schedule a follow-up appointment in: 3 months with Dr.Croitoru + pacemaker check.  
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
## 2014-08-15 ENCOUNTER — Telehealth: Payer: Self-pay | Admitting: *Deleted

## 2014-08-15 DIAGNOSIS — E785 Hyperlipidemia, unspecified: Secondary | ICD-10-CM

## 2014-08-15 LAB — CUP PACEART REMOTE DEVICE CHECK
Brady Statistic AP VS Percent: 73.39 %
Brady Statistic AS VS Percent: 12.36 %
Date Time Interrogation Session: 20160510135148
Lead Channel Impedance Value: 513 Ohm
Lead Channel Impedance Value: 646 Ohm
Lead Channel Pacing Threshold Amplitude: 0.875 V
Lead Channel Pacing Threshold Pulse Width: 0.4 ms
Lead Channel Sensing Intrinsic Amplitude: 21.875 mV
Lead Channel Sensing Intrinsic Amplitude: 21.875 mV
Lead Channel Setting Pacing Amplitude: 1.75 V
Lead Channel Setting Pacing Pulse Width: 0.4 ms
MDC IDC MSMT BATTERY REMAINING LONGEVITY: 127 mo
MDC IDC MSMT BATTERY VOLTAGE: 3.03 V
MDC IDC MSMT LEADCHNL RA PACING THRESHOLD AMPLITUDE: 0.875 V
MDC IDC MSMT LEADCHNL RA PACING THRESHOLD PULSEWIDTH: 0.4 ms
MDC IDC MSMT LEADCHNL RA SENSING INTR AMPL: 1.875 mV
MDC IDC MSMT LEADCHNL RA SENSING INTR AMPL: 1.875 mV
MDC IDC MSMT LEADCHNL RV IMPEDANCE VALUE: 437 Ohm
MDC IDC MSMT LEADCHNL RV IMPEDANCE VALUE: 513 Ohm
MDC IDC SET LEADCHNL RV PACING AMPLITUDE: 2 V
MDC IDC SET LEADCHNL RV SENSING SENSITIVITY: 2 mV
MDC IDC STAT BRADY AP VP PERCENT: 6.69 %
MDC IDC STAT BRADY AS VP PERCENT: 7.56 %
MDC IDC STAT BRADY RA PERCENT PACED: 80.08 %
MDC IDC STAT BRADY RV PERCENT PACED: 14.25 %
Zone Setting Detection Interval: 400 ms
Zone Setting Detection Interval: 400 ms

## 2014-08-15 NOTE — Telephone Encounter (Signed)
This is already listed on her med list.

## 2014-08-15 NOTE — Telephone Encounter (Signed)
-----   Message from Sanda Klein, MD sent at 08/14/2014  4:20 PM EDT ----- Forgot to order a lipid profile. Can she please have it done in 3 months on the day when she comes in to see me (make her appointment in the morning so she can come in fasting please)

## 2014-09-07 DIAGNOSIS — M5416 Radiculopathy, lumbar region: Secondary | ICD-10-CM | POA: Diagnosis not present

## 2014-09-18 ENCOUNTER — Other Ambulatory Visit (HOSPITAL_COMMUNITY): Payer: Self-pay | Admitting: Orthopedic Surgery

## 2014-09-19 ENCOUNTER — Other Ambulatory Visit: Payer: Self-pay | Admitting: *Deleted

## 2014-09-19 ENCOUNTER — Telehealth: Payer: Self-pay | Admitting: Cardiovascular Disease

## 2014-09-19 MED ORDER — DILTIAZEM HCL ER COATED BEADS 360 MG PO CP24: 360.0000 mg | ORAL_CAPSULE | Freq: Every day | ORAL | Status: DC

## 2014-09-19 NOTE — Telephone Encounter (Signed)
Spoke with den[facility], they are asking for change in the diltiazem. Requested they re-send the fax for dr croitoru to review.

## 2014-09-19 NOTE — Telephone Encounter (Signed)
Rx(s) sent to pharmacy electronically.  

## 2014-09-20 ENCOUNTER — Other Ambulatory Visit (HOSPITAL_COMMUNITY): Payer: Self-pay | Admitting: Orthopedic Surgery

## 2014-09-20 DIAGNOSIS — M5416 Radiculopathy, lumbar region: Secondary | ICD-10-CM

## 2014-09-21 ENCOUNTER — Telehealth: Payer: Self-pay | Admitting: *Deleted

## 2014-09-21 ENCOUNTER — Telehealth: Payer: Self-pay | Admitting: Cardiovascular Disease

## 2014-09-21 MED ORDER — DILTIAZEM HCL ER BEADS 360 MG PO CP24: 360.0000 mg | ORAL_CAPSULE | Freq: Every day | ORAL | Status: DC

## 2014-09-21 NOTE — Telephone Encounter (Signed)
[facility] requesting Dilt be changed to Tiazac 360mg  costing patient 38.50 compared to the dilt CD 360 costing $250.00.  Authorization to change faxed to [facility].

## 2014-09-21 NOTE — Telephone Encounter (Signed)
[facility] called to get OK to switch cardizem CD to generic for tiazac (cardizem TZ) - same dose - for cost savings for patient - advised OK.

## 2014-09-21 NOTE — Telephone Encounter (Signed)
Kelsey Joseph from [facility] is calling about the Diltiazem HCL ER (CD Capsule 360 mg ) .Marland Kitchen

## 2014-09-25 DIAGNOSIS — G4733 Obstructive sleep apnea (adult) (pediatric): Secondary | ICD-10-CM | POA: Diagnosis not present

## 2014-09-25 DIAGNOSIS — Z23 Encounter for immunization: Secondary | ICD-10-CM | POA: Diagnosis not present

## 2014-09-25 DIAGNOSIS — Z0000 Encounter for general adult medical examination without abnormal findings: Secondary | ICD-10-CM | POA: Diagnosis not present

## 2014-09-25 DIAGNOSIS — E039 Hypothyroidism, unspecified: Secondary | ICD-10-CM | POA: Diagnosis not present

## 2014-09-25 DIAGNOSIS — I10 Essential (primary) hypertension: Secondary | ICD-10-CM | POA: Diagnosis not present

## 2014-09-25 DIAGNOSIS — E780 Pure hypercholesterolemia: Secondary | ICD-10-CM | POA: Diagnosis not present

## 2014-09-29 DIAGNOSIS — J069 Acute upper respiratory infection, unspecified: Secondary | ICD-10-CM | POA: Diagnosis not present

## 2014-09-30 ENCOUNTER — Other Ambulatory Visit: Payer: Self-pay | Admitting: Physician Assistant

## 2014-10-01 ENCOUNTER — Inpatient Hospital Stay (HOSPITAL_COMMUNITY)
Admission: EM | Admit: 2014-10-01 | Discharge: 2014-10-07 | DRG: 193 | Disposition: A | Discharge status: Home / Self Care | Payer: Medicare Other | Attending: Internal Medicine | Admitting: Internal Medicine

## 2014-10-01 ENCOUNTER — Encounter (HOSPITAL_COMMUNITY): Payer: Self-pay | Admitting: Family Medicine

## 2014-10-01 ENCOUNTER — Emergency Department (HOSPITAL_COMMUNITY): Payer: Medicare Other

## 2014-10-01 DIAGNOSIS — E871 Hypo-osmolality and hyponatremia: Secondary | ICD-10-CM | POA: Diagnosis not present

## 2014-10-01 DIAGNOSIS — D649 Anemia, unspecified: Secondary | ICD-10-CM | POA: Diagnosis not present

## 2014-10-01 DIAGNOSIS — E872 Acidosis: Secondary | ICD-10-CM | POA: Diagnosis present

## 2014-10-01 DIAGNOSIS — I5032 Chronic diastolic (congestive) heart failure: Secondary | ICD-10-CM | POA: Diagnosis present

## 2014-10-01 DIAGNOSIS — Z950 Presence of cardiac pacemaker: Secondary | ICD-10-CM | POA: Diagnosis present

## 2014-10-01 DIAGNOSIS — Z7901 Long term (current) use of anticoagulants: Secondary | ICD-10-CM

## 2014-10-01 DIAGNOSIS — H9190 Unspecified hearing loss, unspecified ear: Secondary | ICD-10-CM | POA: Diagnosis present

## 2014-10-01 DIAGNOSIS — I480 Paroxysmal atrial fibrillation: Secondary | ICD-10-CM | POA: Diagnosis present

## 2014-10-01 DIAGNOSIS — G4733 Obstructive sleep apnea (adult) (pediatric): Secondary | ICD-10-CM | POA: Diagnosis present

## 2014-10-01 DIAGNOSIS — I2510 Atherosclerotic heart disease of native coronary artery without angina pectoris: Secondary | ICD-10-CM | POA: Diagnosis present

## 2014-10-01 DIAGNOSIS — D638 Anemia in other chronic diseases classified elsewhere: Secondary | ICD-10-CM | POA: Diagnosis present

## 2014-10-01 DIAGNOSIS — J189 Pneumonia, unspecified organism: Secondary | ICD-10-CM | POA: Diagnosis not present

## 2014-10-01 DIAGNOSIS — K210 Gastro-esophageal reflux disease with esophagitis: Secondary | ICD-10-CM | POA: Diagnosis not present

## 2014-10-01 DIAGNOSIS — R059 Cough, unspecified: Secondary | ICD-10-CM

## 2014-10-01 DIAGNOSIS — K219 Gastro-esophageal reflux disease without esophagitis: Secondary | ICD-10-CM | POA: Diagnosis present

## 2014-10-01 DIAGNOSIS — Z96659 Presence of unspecified artificial knee joint: Secondary | ICD-10-CM | POA: Diagnosis present

## 2014-10-01 DIAGNOSIS — J9601 Acute respiratory failure with hypoxia: Secondary | ICD-10-CM | POA: Diagnosis not present

## 2014-10-01 DIAGNOSIS — E785 Hyperlipidemia, unspecified: Secondary | ICD-10-CM | POA: Diagnosis present

## 2014-10-01 DIAGNOSIS — R0602 Shortness of breath: Secondary | ICD-10-CM | POA: Diagnosis not present

## 2014-10-01 DIAGNOSIS — R05 Cough: Secondary | ICD-10-CM

## 2014-10-01 DIAGNOSIS — Z89611 Acquired absence of right leg above knee: Secondary | ICD-10-CM | POA: Diagnosis not present

## 2014-10-01 DIAGNOSIS — I5033 Acute on chronic diastolic (congestive) heart failure: Secondary | ICD-10-CM | POA: Diagnosis present

## 2014-10-01 DIAGNOSIS — I10 Essential (primary) hypertension: Secondary | ICD-10-CM | POA: Diagnosis present

## 2014-10-01 DIAGNOSIS — E039 Hypothyroidism, unspecified: Secondary | ICD-10-CM | POA: Diagnosis present

## 2014-10-01 DIAGNOSIS — E7800 Pure hypercholesterolemia, unspecified: Secondary | ICD-10-CM | POA: Diagnosis present

## 2014-10-01 DIAGNOSIS — E870 Hyperosmolality and hypernatremia: Secondary | ICD-10-CM | POA: Diagnosis not present

## 2014-10-01 DIAGNOSIS — T380X5A Adverse effect of glucocorticoids and synthetic analogues, initial encounter: Secondary | ICD-10-CM | POA: Diagnosis present

## 2014-10-01 DIAGNOSIS — I4892 Unspecified atrial flutter: Secondary | ICD-10-CM | POA: Diagnosis present

## 2014-10-01 DIAGNOSIS — Z791 Long term (current) use of non-steroidal anti-inflammatories (NSAID): Secondary | ICD-10-CM

## 2014-10-01 LAB — COMPREHENSIVE METABOLIC PANEL
ALBUMIN: 2.8 g/dL — AB (ref 3.5–5.0)
ALK PHOS: 99 U/L (ref 38–126)
ALT: 38 U/L (ref 14–54)
AST: 44 U/L — ABNORMAL HIGH (ref 15–41)
Anion gap: 13 (ref 5–15)
BUN: 15 mg/dL (ref 6–20)
CALCIUM: 8.3 mg/dL — AB (ref 8.9–10.3)
CO2: 20 mmol/L — ABNORMAL LOW (ref 22–32)
Chloride: 95 mmol/L — ABNORMAL LOW (ref 101–111)
Creatinine, Ser: 0.88 mg/dL (ref 0.44–1.00)
GFR calc Af Amer: >60 mL/min (ref >60)
GFR calc non Af Amer: 58 mL/min — ABNORMAL LOW (ref >60)
Glucose, Bld: 152 mg/dL — ABNORMAL HIGH (ref 65–99)
POTASSIUM: 3.9 mmol/L (ref 3.5–5.1)
SODIUM: 128 mmol/L — AB (ref 135–145)
TOTAL PROTEIN: 6.3 g/dL — AB (ref 6.5–8.1)
Total Bilirubin: 0.7 mg/dL (ref 0.3–1.2)

## 2014-10-01 LAB — CBC WITH DIFFERENTIAL/PLATELET
BASOS ABS: 0 10*3/uL (ref 0.0–0.1)
Basophils Relative: 0 % (ref 0–1)
Eosinophils Absolute: 0.1 10*3/uL (ref 0.0–0.7)
Eosinophils Relative: 2 % (ref 0–5)
HCT: 31.3 % — ABNORMAL LOW (ref 36.0–46.0)
Hemoglobin: 10.6 g/dL — ABNORMAL LOW (ref 12.0–15.0)
Lymphocytes Relative: 11 % — ABNORMAL LOW (ref 12–46)
Lymphs Abs: 1 10*3/uL (ref 0.7–4.0)
MCH: 31.7 pg (ref 26.0–34.0)
MCHC: 33.9 g/dL (ref 30.0–36.0)
MCV: 93.7 fL (ref 78.0–100.0)
MONO ABS: 0.7 10*3/uL (ref 0.1–1.0)
Monocytes Relative: 7 % (ref 3–12)
NEUTROS ABS: 7.4 10*3/uL (ref 1.7–7.7)
NEUTROS PCT: 80 % — AB (ref 43–77)
PLATELETS: 190 10*3/uL (ref 150–400)
RBC: 3.34 MIL/uL — ABNORMAL LOW (ref 3.87–5.11)
RDW: 13.5 % (ref 11.5–15.5)
WBC: 9.2 10*3/uL (ref 4.0–10.5)

## 2014-10-01 LAB — PROTIME-INR
INR: 1.34 (ref 0.00–1.49)
Prothrombin Time: 16.7 seconds — ABNORMAL HIGH (ref 11.6–15.2)

## 2014-10-01 LAB — TSH: TSH: 3.077 u[IU]/mL (ref 0.350–4.500)

## 2014-10-01 LAB — I-STAT CG4 LACTIC ACID, ED
Lactic Acid, Venous: 2.95 mmol/L (ref 0.5–2.0)
Lactic Acid, Venous: 1.53 mmol/L (ref 0.5–2.0)

## 2014-10-01 LAB — EXPECTORATED SPUTUM ASSESSMENT W GRAM STAIN, RFLX TO RESP C

## 2014-10-01 LAB — PROCALCITONIN: Procalcitonin: <0.1 ng/mL

## 2014-10-01 LAB — MRSA PCR SCREENING: MRSA BY PCR: NEGATIVE

## 2014-10-01 LAB — APTT: APTT: 38 s — AB (ref 24–37)

## 2014-10-01 LAB — EXPECTORATED SPUTUM ASSESSMENT W REFEX TO RESP CULTURE

## 2014-10-01 MED ORDER — APIXABAN 2.5 MG PO TABS
2.5000 mg | ORAL_TABLET | Freq: Two times a day (BID) | ORAL | Status: DC
  Administered 2014-10-01 – 2014-10-07 (×12): 2.5 mg via ORAL
  Filled 2014-10-01 (×13): qty 1

## 2014-10-01 MED ORDER — LEVOTHYROXINE SODIUM 50 MCG PO TABS: 50.0000 ug | ORAL_TABLET | Freq: Every day | ORAL | Status: DC

## 2014-10-01 MED ORDER — ESTRADIOL 1 MG PO TABS
0.5000 mg | ORAL_TABLET | Freq: Every evening | ORAL | Status: DC
  Administered 2014-10-01 – 2014-10-06 (×6): 0.5 mg via ORAL
  Filled 2014-10-01 (×7): qty 0.5

## 2014-10-01 MED ORDER — ONDANSETRON HCL 4 MG/2ML IJ SOLN: 4.0000 mg | Freq: Three times a day (TID) | INTRAMUSCULAR | Status: AC | PRN

## 2014-10-01 MED ORDER — TRAMADOL HCL 50 MG PO TABS
50.0000 mg | ORAL_TABLET | Freq: Four times a day (QID) | ORAL | Status: DC | PRN
  Administered 2014-10-01: 50 mg via ORAL
  Filled 2014-10-01: qty 1

## 2014-10-01 MED ORDER — OMEGA-3-ACID ETHYL ESTERS 1 G PO CAPS
1000.0000 mg | ORAL_CAPSULE | Freq: Every day | ORAL | Status: DC
  Administered 2014-10-02 – 2014-10-07 (×6): 1000 mg via ORAL
  Filled 2014-10-01 (×6): qty 1

## 2014-10-01 MED ORDER — DEXTROSE 5 % IV SOLN
1.0000 g | INTRAVENOUS | Status: DC
  Administered 2014-10-02 – 2014-10-04 (×3): 1 g via INTRAVENOUS
  Filled 2014-10-01 (×4): qty 10

## 2014-10-01 MED ORDER — METHYLPREDNISOLONE SODIUM SUCC 125 MG IJ SOLR
125.0000 mg | Freq: Once | INTRAMUSCULAR | Status: AC
  Administered 2014-10-01: 125 mg via INTRAVENOUS
  Filled 2014-10-01: qty 2

## 2014-10-01 MED ORDER — DEXTROSE 5 % IV SOLN
1.0000 g | Freq: Once | INTRAVENOUS | Status: AC
  Administered 2014-10-01: 1 g via INTRAVENOUS
  Filled 2014-10-01: qty 10

## 2014-10-01 MED ORDER — SODIUM CHLORIDE 0.9 % IV SOLN
INTRAVENOUS | Status: DC
  Administered 2014-10-01 – 2014-10-02 (×2): via INTRAVENOUS
  Administered 2014-10-02: 75 mL/h via INTRAVENOUS

## 2014-10-01 MED ORDER — CYCLOBENZAPRINE HCL 10 MG PO TABS: 5.0000 mg | ORAL_TABLET | Freq: Two times a day (BID) | ORAL | Status: DC | PRN

## 2014-10-01 MED ORDER — DEXTROSE 5 % IV SOLN
500.0000 mg | Freq: Once | INTRAVENOUS | Status: AC
  Administered 2014-10-01: 500 mg via INTRAVENOUS
  Filled 2014-10-01: qty 500

## 2014-10-01 MED ORDER — ALBUTEROL SULFATE (2.5 MG/3ML) 0.083% IN NEBU: 2.5000 mg | INHALATION_SOLUTION | Freq: Four times a day (QID) | RESPIRATORY_TRACT | Status: AC | PRN

## 2014-10-01 MED ORDER — MELOXICAM 7.5 MG PO TABS
7.5000 mg | ORAL_TABLET | Freq: Every day | ORAL | Status: DC
  Administered 2014-10-01: 7.5 mg via ORAL
  Filled 2014-10-01 (×2): qty 1

## 2014-10-01 MED ORDER — PRAVASTATIN SODIUM 40 MG PO TABS
40.0000 mg | ORAL_TABLET | Freq: Every evening | ORAL | Status: DC
  Administered 2014-10-01 – 2014-10-06 (×6): 40 mg via ORAL
  Filled 2014-10-01 (×7): qty 1

## 2014-10-01 MED ORDER — SODIUM CHLORIDE 0.9 % IV BOLUS (SEPSIS)
500.0000 mL | Freq: Once | INTRAVENOUS | Status: AC
  Administered 2014-10-01: 500 mL via INTRAVENOUS

## 2014-10-01 MED ORDER — FLUTICASONE PROPIONATE 50 MCG/ACT NA SUSP
1.0000 | Freq: Every day | NASAL | Status: DC
  Administered 2014-10-02 – 2014-10-07 (×6): 1 via NASAL
  Filled 2014-10-01: qty 16

## 2014-10-01 MED ORDER — MONTELUKAST SODIUM 10 MG PO TABS
10.0000 mg | ORAL_TABLET | Freq: Every day | ORAL | Status: DC
  Administered 2014-10-02 – 2014-10-07 (×6): 10 mg via ORAL
  Filled 2014-10-01 (×6): qty 1

## 2014-10-01 MED ORDER — CYCLOSPORINE 0.05 % OP EMUL
1.0000 [drp] | Freq: Two times a day (BID) | OPHTHALMIC | Status: DC
  Administered 2014-10-01 – 2014-10-07 (×12): 1 [drp] via OPHTHALMIC
  Filled 2014-10-01 (×15): qty 1

## 2014-10-01 MED ORDER — METHYLPREDNISOLONE SODIUM SUCC 125 MG IJ SOLR
60.0000 mg | Freq: Four times a day (QID) | INTRAMUSCULAR | Status: DC
  Administered 2014-10-01 – 2014-10-04 (×11): 60 mg via INTRAVENOUS
  Filled 2014-10-01 (×2): qty 0.96
  Filled 2014-10-01 (×2): qty 2
  Filled 2014-10-01 (×2): qty 0.96
  Filled 2014-10-01: qty 2
  Filled 2014-10-01 (×8): qty 0.96

## 2014-10-01 MED ORDER — LEVOTHYROXINE SODIUM 100 MCG PO TABS
100.0000 ug | ORAL_TABLET | ORAL | Status: DC
  Administered 2014-10-02 – 2014-10-07 (×5): 100 ug via ORAL
  Filled 2014-10-01 (×6): qty 1

## 2014-10-01 MED ORDER — IPRATROPIUM-ALBUTEROL 0.5-2.5 (3) MG/3ML IN SOLN
3.0000 mL | RESPIRATORY_TRACT | Status: DC
  Administered 2014-10-01 – 2014-10-02 (×5): 3 mL via RESPIRATORY_TRACT
  Filled 2014-10-01 (×5): qty 3

## 2014-10-01 MED ORDER — AZITHROMYCIN 500 MG IV SOLR
500.0000 mg | INTRAVENOUS | Status: DC
  Administered 2014-10-02 – 2014-10-04 (×3): 500 mg via INTRAVENOUS
  Filled 2014-10-01 (×4): qty 500

## 2014-10-01 MED ORDER — PANTOPRAZOLE SODIUM 40 MG PO TBEC
40.0000 mg | DELAYED_RELEASE_TABLET | Freq: Every day | ORAL | Status: DC
  Administered 2014-10-01 – 2014-10-07 (×7): 40 mg via ORAL
  Filled 2014-10-01 (×7): qty 1

## 2014-10-01 MED ORDER — LEVOTHYROXINE SODIUM 50 MCG PO TABS
50.0000 ug | ORAL_TABLET | ORAL | Status: DC
  Administered 2014-10-03: 50 ug via ORAL
  Filled 2014-10-01: qty 1
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
## 2014-10-01 NOTE — Care Management Note (Signed)
Case Management Note  Patient Details  Name: Kelsey Joseph MRN: [id number] Date of Birth: [birth year]  Subjective/Objective:      Adm w fever              Action/Plan: lives at home, pcp dr Banker   Expected Discharge Date:                  Expected Discharge Plan:     In-House Referral:     Discharge planning Services     Post Acute Care Choice:    Choice offered to:     DME Arranged:    DME Agency:     HH Arranged:    [place] Agency:     Status of Service:     Medicare Important Message Given:    Date Medicare IM Given:    Medicare IM give by:    Date Additional Medicare IM Given:    Additional Medicare Important Message give by:     If discussed at [place] of Stay Meetings, dates discussed:    Additional Comments: ur review done  Lacretia Leigh, RN 10/01/2014, 3:55 PM

## 2014-10-01 NOTE — ED Notes (Signed)
Pt here for cough since wednesday. Has been around daughter that had a cold. sts some wheezing. sts fever this am of 102. sts took hydrocodone

## 2014-10-01 NOTE — Progress Notes (Signed)
IV infilitrated in right forearm in ED. ED Rn removed IV and placed in gauze.

## 2014-10-02 ENCOUNTER — Telehealth: Payer: Self-pay | Admitting: Cardiovascular Disease

## 2014-10-02 ENCOUNTER — Ambulatory Visit (HOSPITAL_COMMUNITY): Admission: RE | Admit: 2014-10-02 | Payer: Medicare Other | Source: Ambulatory Visit

## 2014-10-02 LAB — BASIC METABOLIC PANEL
Anion gap: 11 (ref 5–15)
BUN: 23 mg/dL — ABNORMAL HIGH (ref 6–20)
CALCIUM: 7.7 mg/dL — AB (ref 8.9–10.3)
CO2: 19 mmol/L — ABNORMAL LOW (ref 22–32)
Chloride: 96 mmol/L — ABNORMAL LOW (ref 101–111)
Creatinine, Ser: 1.11 mg/dL — ABNORMAL HIGH (ref 0.44–1.00)
GFR calc Af Amer: 51 mL/min — ABNORMAL LOW (ref >60)
GFR calc non Af Amer: 44 mL/min — ABNORMAL LOW (ref >60)
Glucose, Bld: 361 mg/dL — ABNORMAL HIGH (ref 65–99)
Potassium: 4.3 mmol/L (ref 3.5–5.1)
SODIUM: 126 mmol/L — AB (ref 135–145)

## 2014-10-02 LAB — STREP PNEUMONIAE URINARY ANTIGEN: STREP PNEUMO URINARY ANTIGEN: NEGATIVE

## 2014-10-02 LAB — CBC
HCT: 27.7 % — ABNORMAL LOW (ref 36.0–46.0)
Hemoglobin: 9.5 g/dL — ABNORMAL LOW (ref 12.0–15.0)
MCH: 31.8 pg (ref 26.0–34.0)
MCHC: 34.3 g/dL (ref 30.0–36.0)
MCV: 92.6 fL (ref 78.0–100.0)
Platelets: 172 10*3/uL (ref 150–400)
RBC: 2.99 MIL/uL — AB (ref 3.87–5.11)
RDW: 13.8 % (ref 11.5–15.5)
WBC: 7.9 10*3/uL (ref 4.0–10.5)

## 2014-10-02 LAB — HIV ANTIBODY (ROUTINE TESTING W REFLEX): HIV Screen 4th Generation wRfx: NONREACTIVE

## 2014-10-02 MED ORDER — METOPROLOL TARTRATE 1 MG/ML IV SOLN
5.0000 mg | Freq: Four times a day (QID) | INTRAVENOUS | Status: DC
  Filled 2014-10-02 (×4): qty 5

## 2014-10-02 MED ORDER — HYDROCODONE-ACETAMINOPHEN 5-325 MG PO TABS
1.0000 | ORAL_TABLET | Freq: Four times a day (QID) | ORAL | Status: DC | PRN
  Administered 2014-10-05: 1 via ORAL
  Filled 2014-10-02 (×2): qty 1

## 2014-10-02 MED ORDER — MELOXICAM 15 MG PO TABS: 15.0000 mg | ORAL_TABLET | Freq: Every day | ORAL | Status: DC

## 2014-10-02 MED ORDER — GUAIFENESIN ER 1200 MG PO TB12: 1200.0000 mg | ORAL_TABLET | Freq: Every day | ORAL | Status: DC | PRN

## 2014-10-02 MED ORDER — ESTROGENS, CONJUGATED 0.625 MG/GM VA CREA
1.0000 | TOPICAL_CREAM | VAGINAL | Status: DC
  Administered 2014-10-04: 1 via VAGINAL
  Filled 2014-10-02: qty 30

## 2014-10-02 MED ORDER — ALBUTEROL SULFATE (2.5 MG/3ML) 0.083% IN NEBU
2.5000 mg | INHALATION_SOLUTION | RESPIRATORY_TRACT | Status: DC | PRN
  Administered 2014-10-02 – 2014-10-03 (×2): 2.5 mg via RESPIRATORY_TRACT
  Filled 2014-10-02 (×2): qty 3

## 2014-10-02 MED ORDER — METOPROLOL TARTRATE 100 MG PO TABS
100.0000 mg | ORAL_TABLET | Freq: Two times a day (BID) | ORAL | Status: DC
  Administered 2014-10-02 – 2014-10-07 (×11): 100 mg via ORAL
  Filled 2014-10-02 (×12): qty 1

## 2014-10-02 MED ORDER — IPRATROPIUM-ALBUTEROL 0.5-2.5 (3) MG/3ML IN SOLN
3.0000 mL | Freq: Three times a day (TID) | RESPIRATORY_TRACT | Status: DC
  Administered 2014-10-02 – 2014-10-03 (×3): 3 mL via RESPIRATORY_TRACT
  Filled 2014-10-02 (×3): qty 3

## 2014-10-02 MED ORDER — IRBESARTAN 300 MG PO TABS
300.0000 mg | ORAL_TABLET | Freq: Every day | ORAL | Status: DC
  Administered 2014-10-02 – 2014-10-06 (×5): 300 mg via ORAL
  Filled 2014-10-02 (×6): qty 1

## 2014-10-02 MED ORDER — DILTIAZEM HCL ER BEADS 240 MG PO CP24: 240.0000 mg | ORAL_CAPSULE | Freq: Every day | ORAL | Status: DC

## 2014-10-02 MED ORDER — FUROSEMIDE 10 MG/ML IJ SOLN
20.0000 mg | Freq: Once | INTRAMUSCULAR | Status: AC
  Administered 2014-10-02: 20 mg via INTRAVENOUS
  Filled 2014-10-02 (×2): qty 2

## 2014-10-02 MED ORDER — DIAZEPAM 5 MG PO TABS: 5.0000 mg | ORAL_TABLET | Freq: Three times a day (TID) | ORAL | Status: DC | PRN

## 2014-10-02 MED ORDER — GUAIFENESIN ER 600 MG PO TB12
1200.0000 mg | ORAL_TABLET | Freq: Every day | ORAL | Status: DC | PRN
  Administered 2014-10-02: 1200 mg via ORAL
  Filled 2014-10-02 (×2): qty 2

## 2014-10-02 MED ORDER — DILTIAZEM HCL ER COATED BEADS 240 MG PO CP24
240.0000 mg | ORAL_CAPSULE | Freq: Every day | ORAL | Status: DC
  Administered 2014-10-02: 240 mg via ORAL
  Filled 2014-10-02 (×2): qty 1

## 2014-10-02 MED ORDER — LORATADINE 10 MG PO TABS
10.0000 mg | ORAL_TABLET | Freq: Every day | ORAL | Status: DC
  Administered 2014-10-03 – 2014-10-07 (×5): 10 mg via ORAL
  Filled 2014-10-02 (×6): qty 1

## 2014-10-02 NOTE — Evaluation (Signed)
Physical Therapy Evaluation Patient Details Name: Kelsey Joseph MRN: [id number] DOB: 06-28-1927 Today's Date: 10/02/2014   History of Present Illness  Patient is a 81 year old female with paroxysmal atrial fibrillation on anticoagulation, right AKA, pacemaker, hypothyroidism presented to ED with fever, cough and congestion. Pt with bil PNA  Clinical Impression  Pt with AKA which she has not worn prosthesis for 1.5 years after neuroma on Right hip. Pt uses scooter in home and crutches to walk into her closet otherwise is self-sufficient from scooter level. Pt with increased fatigue, SOB and elevated HR with each transfer even supine to sitting. Sats on RA dropping to 87% and HR 126 with limited activity with sats returning to 93% on RA with ceasing movement and cues for breathing. Pt with decreased activity tolerance and strength who will benefit from acute therapy to maximize mobility and function for safe return home. Recommend OOB throughout the day with nursing assist.     Follow Up Recommendations No PT follow up    Equipment Recommendations  None recommended by PT    Recommendations for Other Services       Precautions / Restrictions Precautions Precautions: Fall Precaution Comments: watch sats, R AKA Restrictions Weight Bearing Restrictions: Yes RLE Weight Bearing: Non weight bearing      Mobility  Bed Mobility Overal bed mobility: Modified Independent                Transfers Overall transfer level: Needs assistance   Transfers: Sit to/from Stand;Stand Pivot Transfers Sit to Stand: Supervision Stand pivot transfers: Supervision       General transfer comment: cues and assist for lines as well as room setup. Pt able to transfer from bed to scooter to recliner. pt then stood x 6 times from recliner with bil UE assist mod I  Ambulation/Gait Ambulation/Gait assistance:  (pt states she only walks short distance with crutches and denied attempting with RW today)               Stairs            Wheelchair Mobility    Modified Rankin (Stroke Patients Only)       Balance Overall balance assessment: Needs assistance   Sitting balance-Leahy Scale: Good       Standing balance-Leahy Scale: Poor                               Pertinent Vitals/Pain Pain Assessment: No/denies pain    Home Living Family/patient expects to be discharged to:: Private residence Living Arrangements: Alone Available Help at Discharge: Available PRN/intermittently;Family Type of Home: House Home Access: Level entry     Home Layout: One level Home Equipment: Chartered certified accountant;Shower seat - built in      Prior Function Level of Independence: Independent         Comments: assist  for housework 1x/wk, manages from scooter level for last 1.5 years     Hand Dominance        Extremity/Trunk Assessment   Upper Extremity Assessment: Overall WFL for tasks assessed           Lower Extremity Assessment: Generalized weakness      Cervical / Trunk Assessment: Normal  Communication   Communication: No difficulties  Cognition Arousal/Alertness: Awake/alert Behavior During Therapy: WFL for tasks assessed/performed Overall Cognitive Status: Within Functional Limits for tasks assessed  General Comments      Exercises General Exercises - Lower Extremity Long Arc Quad: AROM;Seated;Left;15 reps Hip Flexion/Marching: AROM;Seated;Left;15 reps Other Exercises Other Exercises: armrest pushups bil UE x 10 AROM      Assessment/Plan    PT Assessment Patient needs continued PT services  PT Diagnosis Generalized weakness   PT Problem List Decreased strength;Decreased activity tolerance;Decreased mobility;Cardiopulmonary status limiting activity  PT Treatment Interventions Functional mobility training;Therapeutic activities;Therapeutic exercise;Patient/family education;DME instruction   PT Goals  (Current goals can be found in the Care Plan section) Acute Rehab PT Goals Patient Stated Goal: return home PT Goal Formulation: With patient Time For Goal Achievement: 10/16/14 Potential to Achieve Goals: Good    Frequency Min 3X/week   Barriers to discharge Decreased caregiver support      Co-evaluation               End of Session   Activity Tolerance: Patient tolerated treatment well Patient left: in chair;with call bell/phone within reach Nurse Communication: Mobility status;Precautions         Time: 7034-0352 PT Time Calculation (min) (ACUTE ONLY): 24 min   Charges:   PT Evaluation $Initial PT Evaluation Tier I: 1 Procedure PT Treatments $Therapeutic Exercise: 8-22 mins   PT G Codes:        Melford Aase 10/02/2014, 9:53 AM Elwyn Reach, [place]

## 2014-10-02 NOTE — Telephone Encounter (Signed)
Pt called in stating that the she is currently in the hospital for pneumonia and her heart rate got up to the 130's and doctor on call is going to give her a medication to aid this. She wanted to make Dr. Loletha Grayer aware of this. She stated that she is on unit 2 Heart 16.  Thanks

## 2014-10-02 NOTE — Progress Notes (Signed)
Inpatient Diabetes Program Recommendations  AACE/ADA: New Consensus Statement on Inpatient Glycemic Control (2013)  Target Ranges:  Prepandial:   less than 140 mg/dL      Peak postprandial:   less than 180 mg/dL (1-2 hours)      Critically ill patients:  140 - 180 mg/dL   Review of Glycemic Control:   Results for TRANIKA, SCHOLLER (MRN [id number]) as of 10/02/2014 11:00  Ref. Range 10/02/2014 04:00  Glucose Latest Ref Range: 65-99 mg/dL 361 (H)   No history of diabetes.    Consider checking CBG's tid with meals and HS.  Please consider adding Novolog moderate correction while patient is on steroids.  Thanks, Adah Perl, RN, BC-ADM Inpatient Diabetes Coordinator Pager [phone] (8a-5p)
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
## 2014-10-03 ENCOUNTER — Inpatient Hospital Stay (HOSPITAL_COMMUNITY): Payer: Medicare Other

## 2014-10-03 DIAGNOSIS — E871 Hypo-osmolality and hyponatremia: Secondary | ICD-10-CM

## 2014-10-03 DIAGNOSIS — I5032 Chronic diastolic (congestive) heart failure: Secondary | ICD-10-CM

## 2014-10-03 DIAGNOSIS — J9601 Acute respiratory failure with hypoxia: Secondary | ICD-10-CM

## 2014-10-03 DIAGNOSIS — D649 Anemia, unspecified: Secondary | ICD-10-CM

## 2014-10-03 DIAGNOSIS — J189 Pneumonia, unspecified organism: Principal | ICD-10-CM

## 2014-10-03 DIAGNOSIS — E039 Hypothyroidism, unspecified: Secondary | ICD-10-CM

## 2014-10-03 DIAGNOSIS — I480 Paroxysmal atrial fibrillation: Secondary | ICD-10-CM

## 2014-10-03 LAB — GLUCOSE, CAPILLARY
GLUCOSE-CAPILLARY: 242 mg/dL — AB (ref 65–99)
GLUCOSE-CAPILLARY: 126 mg/dL — AB (ref 65–99)
Glucose-Capillary: 353 mg/dL — ABNORMAL HIGH (ref 65–99)

## 2014-10-03 LAB — RENAL FUNCTION PANEL
ALBUMIN: 2.5 g/dL — AB (ref 3.5–5.0)
Anion gap: 13 (ref 5–15)
BUN: 25 mg/dL — ABNORMAL HIGH (ref 6–20)
CO2: 19 mmol/L — AB (ref 22–32)
CREATININE: 0.99 mg/dL (ref 0.44–1.00)
Calcium: 8.3 mg/dL — ABNORMAL LOW (ref 8.9–10.3)
Chloride: 96 mmol/L — ABNORMAL LOW (ref 101–111)
GFR calc Af Amer: 59 mL/min — ABNORMAL LOW (ref >60)
GFR, EST NON AFRICAN AMERICAN: 51 mL/min — AB (ref >60)
Glucose, Bld: 306 mg/dL — ABNORMAL HIGH (ref 65–99)
Phosphorus: 2.7 mg/dL (ref 2.5–4.6)
Potassium: 4.6 mmol/L (ref 3.5–5.1)
Sodium: 128 mmol/L — ABNORMAL LOW (ref 135–145)

## 2014-10-03 LAB — LEGIONELLA ANTIGEN, URINE

## 2014-10-03 LAB — URINE CULTURE

## 2014-10-03 LAB — RETICULOCYTES
RBC.: 3.06 MIL/uL — AB (ref 3.87–5.11)
RETIC COUNT ABSOLUTE: 61.2 10*3/uL (ref 19.0–186.0)
Retic Ct Pct: 2 % (ref 0.4–3.1)

## 2014-10-03 LAB — CBC WITH DIFFERENTIAL/PLATELET
Basophils Absolute: 0 10*3/uL (ref 0.0–0.1)
Basophils Relative: 0 % (ref 0–1)
EOS ABS: 0 10*3/uL (ref 0.0–0.7)
Eosinophils Relative: 0 % (ref 0–5)
HCT: 27.9 % — ABNORMAL LOW (ref 36.0–46.0)
Hemoglobin: 9.6 g/dL — ABNORMAL LOW (ref 12.0–15.0)
LYMPHS PCT: 6 % — AB (ref 12–46)
Lymphs Abs: 0.8 10*3/uL (ref 0.7–4.0)
MCH: 32.2 pg (ref 26.0–34.0)
MCHC: 34.4 g/dL (ref 30.0–36.0)
MCV: 93.6 fL (ref 78.0–100.0)
Monocytes Absolute: 0.5 10*3/uL (ref 0.1–1.0)
Monocytes Relative: 4 % (ref 3–12)
Neutro Abs: 11.6 10*3/uL — ABNORMAL HIGH (ref 1.7–7.7)
Neutrophils Relative %: 90 % — ABNORMAL HIGH (ref 43–77)
Platelets: 225 10*3/uL (ref 150–400)
RBC: 2.98 MIL/uL — ABNORMAL LOW (ref 3.87–5.11)
RDW: 14 % (ref 11.5–15.5)
WBC: 12.9 10*3/uL — AB (ref 4.0–10.5)

## 2014-10-03 LAB — VITAMIN B12: Vitamin B-12: 1112 pg/mL — ABNORMAL HIGH (ref 180–914)

## 2014-10-03 LAB — FERRITIN: Ferritin: 295 ng/mL (ref 11–307)

## 2014-10-03 LAB — IRON AND TIBC
Iron: 48 ug/dL (ref 28–170)
Saturation Ratios: 18 % (ref 10.4–31.8)
TIBC: 273 ug/dL (ref 250–450)
UIBC: 225 ug/dL

## 2014-10-03 LAB — PROCALCITONIN: Procalcitonin: 0.19 ng/mL

## 2014-10-03 LAB — FOLATE: Folate: 25 ng/mL (ref >5.9)

## 2014-10-03 MED ORDER — LEVALBUTEROL HCL 0.63 MG/3ML IN NEBU: 0.6300 mg | INHALATION_SOLUTION | RESPIRATORY_TRACT | Status: DC | PRN

## 2014-10-03 MED ORDER — TORSEMIDE 20 MG PO TABS
20.0000 mg | ORAL_TABLET | Freq: Every day | ORAL | Status: DC
  Administered 2014-10-03 – 2014-10-05 (×3): 20 mg via ORAL
  Filled 2014-10-03 (×4): qty 1

## 2014-10-03 MED ORDER — BUDESONIDE 0.25 MG/2ML IN SUSP
0.2500 mg | Freq: Two times a day (BID) | RESPIRATORY_TRACT | Status: DC
  Administered 2014-10-03 – 2014-10-07 (×7): 0.25 mg via RESPIRATORY_TRACT
  Filled 2014-10-03 (×11): qty 2

## 2014-10-03 MED ORDER — ARFORMOTEROL TARTRATE 15 MCG/2ML IN NEBU
15.0000 ug | INHALATION_SOLUTION | Freq: Two times a day (BID) | RESPIRATORY_TRACT | Status: DC
  Administered 2014-10-03 – 2014-10-07 (×7): 15 ug via RESPIRATORY_TRACT
  Filled 2014-10-03 (×11): qty 2

## 2014-10-03 MED ORDER — DILTIAZEM HCL ER COATED BEADS 360 MG PO CP24
360.0000 mg | ORAL_CAPSULE | Freq: Every day | ORAL | Status: DC
  Administered 2014-10-03 – 2014-10-07 (×5): 360 mg via ORAL
  Filled 2014-10-03 (×5): qty 1

## 2014-10-03 MED ORDER — INSULIN ASPART 100 UNIT/ML ~~LOC~~ SOLN
0.0000 [IU] | Freq: Three times a day (TID) | SUBCUTANEOUS | Status: DC
  Administered 2014-10-03: 15 [IU] via SUBCUTANEOUS
  Administered 2014-10-03: 11 [IU] via SUBCUTANEOUS
  Administered 2014-10-04: 15 [IU] via SUBCUTANEOUS
  Administered 2014-10-04: 7 [IU] via SUBCUTANEOUS
  Administered 2014-10-04: 4 [IU] via SUBCUTANEOUS
  Administered 2014-10-05: 11 [IU] via SUBCUTANEOUS
  Administered 2014-10-05 – 2014-10-06 (×3): 4 [IU] via SUBCUTANEOUS
  Administered 2014-10-06 (×2): 7 [IU] via SUBCUTANEOUS
  Administered 2014-10-07: 3 [IU] via SUBCUTANEOUS
  Administered 2014-10-07: 4 [IU] via SUBCUTANEOUS

## 2014-10-03 MED ORDER — FUROSEMIDE 10 MG/ML IJ SOLN
40.0000 mg | Freq: Once | INTRAMUSCULAR | Status: AC
  Administered 2014-10-03: 40 mg via INTRAVENOUS
  Filled 2014-10-03: qty 4

## 2014-10-03 NOTE — Progress Notes (Addendum)
[facility] PROGRESS NOTE  Kelsey Joseph INO:[id number] DOB: November 11, 1927 DOA: 10/01/2014 PCP: Horatio Pel, MD  Assessment/Plan: #1 acute respiratory failure with hypoxia Secondary to community acquired pneumonia with sepsis and lactic acidosis. Patient states no significant improvement with her breathing. Patient 95% on 3 L nasal cannula. Patient with respiratory wheezing, diffuse rhonchi, coarse breath sounds. Chest x-ray on admission with bilateral pneumonia. Continue oxygen, IV Rocephin and IV azithromycin, change: Nebs to Pulmicort and Brovana. Continue Flonase. Continue IV steroids. Repeat chest x-ray. Follow.  #2 bilateral community-acquired pneumonia Per chest x-ray. Sputum Gram stain and culture pending. Blood cultures pending. Patient is afebrile. Patient with a leukocytosis with a white count of 12.9 however on IV steroids. Continue empiric IV antibiotics, oxygen, change nebulizers to Pulmicort and Brovana, IV steroids taper. Repeat chest x-ray. Follow.  #3 hypernatremia ?? Etiology. TSH at 3.077. CXR with CAP.Some improvement. NSL; IVF. check a urine sodium. Check a urine creatinine. Resume home diuretics.Follow.  #4 PAF s/p PPM Will change metoprolol and Cardizem back to home dose. Continue up apixaban.  #5 hypothyroidism TSH within normal limits. Continue Synthroid.  #6 ?? Acute superimposed on chronic diastolic heart failure Was slightly decompensated yesterday. 2-D echo from 04/19/2014 with a EF of 60-65% with grade 2 diastolic dysfunction. Patient with complaints of worsening wheezing and shortness of breath. Repeat chest x-ray. Patient was given a dose of IV Lasix yesterday. Resume home dose torsemide. Follow.  #7 hyperlipidemia Continue Pravachol.  #8 leg injury status post multiple surgeries a right AKA patient with phantom leg pain. Continue diazepam and Flexeril. Outpatient follow-up.  #9 hypertension Increase Cardizem back to home dose. Continue  metoprolol and ARB. Monitor.   #10 anemia No overt bleeding. Follow H&H. Check an anemia panel.   #11 prophylaxis PPI for GI prophylaxis. On apixaban for DVT prophylaxis.     Code Status: Full Family Communication: Updated patient. No family present. Disposition Plan: Remain the step down unit.   Consultants:  None  Procedures:  Chest x-ray 10/01/2014  Antibiotics:  IV Rocephin 10/01/2014  IV azithromycin 10/01/2014  HPI/Subjective: Patient states no improvement in her shortness of breath and feels unwell this morning. Patient denies any chest pain.  Objective: Filed Vitals:   10/03/14 0806  BP: 148/86  Pulse: 105  Temp: 97.4 F (36.3 C)  Resp: 21    Intake/Output Summary (Last 24 hours) at 10/03/14 0941 Last data filed at 10/03/14 0900  Gross per 24 hour  Intake 2378.75 ml  Output    800 ml  Net 1578.75 ml   Filed Weights   10/01/14 1645  Weight: 62.9 kg (138 lb 10.7 oz)    Exam:   General:  Patient speaking in full sentences however with some audible wheezing.  Cardiovascular: Irregularly irregular.  Respiratory: Left basilar crackles, diffuse rhonchi, expiratory wheezing, coarse breath sounds. Upper airway noise.  Abdomen: Soft, nontender, nondistended, positive bowel sounds.  Musculoskeletal: No clubbing cyanosis or edema.  Data Reviewed: Basic Metabolic Panel:  Recent Labs Lab 10/01/14 1154 10/02/14 0400 10/03/14 0520  NA 128* 126* 128*  K 3.9 4.3 4.6  CL 95* 96* 96*  CO2 20* 19* 19*  GLUCOSE 152* 361* 306*  BUN 15 23* 25*  CREATININE 0.88 1.11* 0.99  CALCIUM 8.3* 7.7* 8.3*  PHOS  --   --  2.7   Liver Function Tests:  Recent Labs Lab 10/01/14 1154 10/03/14 0520  AST 44*  --   ALT 38  --   ALKPHOS 99  --  BILITOT 0.7  --   PROT 6.3*  --   ALBUMIN 2.8* 2.5*   No results for input(s): LIPASE, AMYLASE in the last 168 hours. No results for input(s): AMMONIA in the last 168 hours. CBC:  Recent Labs Lab  10/01/14 1154 10/02/14 0400 10/03/14 0520  WBC 9.2 7.9 12.9*  NEUTROABS 7.4  --  11.6*  HGB 10.6* 9.5* 9.6*  HCT 31.3* 27.7* 27.9*  MCV 93.7 92.6 93.6  PLT 190 172 225   Cardiac Enzymes: No results for input(s): CKTOTAL, CKMB, CKMBINDEX, TROPONINI in the last 168 hours. BNP (last 3 results) No results for input(s): BNP in the last 8760 hours.  ProBNP (last 3 results) No results for input(s): PROBNP in the last 8760 hours.  CBG: No results for input(s): GLUCAP in the last 168 hours.  Recent Results (from the past 240 hour(s))  Culture, blood (routine x 2)     Status: None (Preliminary result)   Collection Time: 10/01/14 12:50 PM  Result Value Ref Range Status   Specimen Description BLOOD RIGHT FOREARM  Final   Special Requests BOTTLES DRAWN AEROBIC AND ANAEROBIC 5CC  Final   Culture NO GROWTH < 24 HOURS  Final   Report Status PENDING  Incomplete  Culture, blood (routine x 2)     Status: None (Preliminary result)   Collection Time: 10/01/14  1:03 PM  Result Value Ref Range Status   Specimen Description BLOOD LEFT ANTECUBITAL  Final   Special Requests BOTTLES DRAWN AEROBIC AND ANAEROBIC 5CC  Final   Culture NO GROWTH < 24 HOURS  Final   Report Status PENDING  Incomplete  MRSA PCR Screening     Status: None   Collection Time: 10/01/14  3:40 PM  Result Value Ref Range Status   MRSA by PCR NEGATIVE NEGATIVE Final    Comment:        The GeneXpert MRSA Assay (FDA approved for NASAL specimens only), is one component of a comprehensive MRSA colonization surveillance program. It is not intended to diagnose MRSA infection nor to guide or monitor treatment for MRSA infections.   Culture, sputum-assessment     Status: None   Collection Time: 10/01/14  4:08 PM  Result Value Ref Range Status   Specimen Description SPUTUM  Final   Special Requests NONE  Final   Sputum evaluation   Final    THIS SPECIMEN IS ACCEPTABLE. RESPIRATORY CULTURE REPORT TO FOLLOW.   Report Status  10/01/2014 FINAL  Final  Culture, respiratory (NON-Expectorated)     Status: None (Preliminary result)   Collection Time: 10/01/14  4:08 PM  Result Value Ref Range Status   Specimen Description SPUTUM  Final   Special Requests NONE  Final   Gram Stain   Final    MODERATE WBC PRESENT,BOTH PMN AND MONONUCLEAR FEW SQUAMOUS EPITHELIAL CELLS PRESENT ABUNDANT GRAM POSITIVE COCCI IN CLUSTERS IN PAIRS IN CHAINS FEW GRAM NEGATIVE RODS FEW GRAM POSITIVE RODS    Culture   Final    NORMAL OROPHARYNGEAL FLORA Performed at [facility]    Report Status PENDING  Incomplete     Studies: Dg Chest 2 View  10/01/2014   CLINICAL DATA:  Cough.  Shortness of breath.  EXAM: CHEST  2 VIEW  COMPARISON:  03/21/2014.  FINDINGS: Dual lead pacemaker remains in place. Heart size is normal. There is atherosclerosis of the aorta. There is perihilar opacity bilaterally more extensive on the right than the left most consistent with bronchopneumonia. Region of most pronounced  involvement is the superior segment of the right lower lobe. Acute presentation of interstitial and alveolar edema is possible but felt less likely. No effusions. Bony structures unremarkable.  IMPRESSION: Bilateral perihilar pneumonia, right more than left.   Electronically Signed   By: Nelson Chimes M.D.   On: 10/01/2014 12:40   Dg Chest Port 1 View  10/03/2014   CLINICAL DATA:  Shortness of breath.  EXAM: PORTABLE CHEST - 1 VIEW  COMPARISON:  10/01/2014  FINDINGS: Cardiomediastinal silhouette is unchanged. Heart size is within normal limits. Left-sided dual lead pacemaker remains in place. Right greater than left perihilar opacities on the prior study have increased, now with involvement of both lung bases. A right pleural effusion is not excluded. No pneumothorax is seen.  IMPRESSION: Worsening bilateral airspace opacities, pneumonia versus edema.   Electronically Signed   By: Logan Bores   On: 10/03/2014 09:26    Scheduled Meds: .  apixaban  2.5 mg Oral BID  . azithromycin  500 mg Intravenous Q24H  . cefTRIAXone (ROCEPHIN)  IV  1 g Intravenous Q24H  . conjugated estrogens  1 Applicatorful Vaginal See admin instructions  . cycloSPORINE  1 drop Both Eyes BID  . diltiazem  240 mg Oral Daily  . estradiol  0.5 mg Oral QPM  . fluticasone  1 spray Each Nare Daily  . ipratropium-albuterol  3 mL Nebulization TID  . irbesartan  300 mg Oral QHS  . levothyroxine  100 mcg Oral Once per day on Sun Mon Tue Thu Fri Sat  . levothyroxine  50 mcg Oral Once per day on Wed  . loratadine  10 mg Oral Daily  . methylPREDNISolone (SOLU-MEDROL) injection  60 mg Intravenous Q6H  . metoprolol  5 mg Intravenous 4 times per day  . metoprolol  100 mg Oral BID  . montelukast  10 mg Oral Daily  . omega-3 acid ethyl esters  1,000 mg Oral Daily  . pantoprazole  40 mg Oral Daily  . pravastatin  40 mg Oral QPM   Continuous Infusions:   Principal Problem:   Acute respiratory failure with hypoxia Active Problems:   GERD   Hypothyroid   Hyperlipidemia   PAF (paroxysmal atrial fibrillation)   Pacemaker   Chronic diastolic heart failure   Community acquired pneumonia   Hyponatremia   CAP (community acquired pneumonia)   Pneumonia   Anemia    Time spent: 86 mins    [facility] MD [facility] Pager [phone]. If 7PM-7AM, please contact night-coverage at [email or web address], password [facility] 10/03/2014, 9:41 AM  LOS: 2 days

## 2014-10-03 NOTE — Care Management (Signed)
Important Message  Patient Details  Name: Kelsey Joseph MRN: [id number] Date of Birth: Nov 27, 1927   Medicare Important Message Given:  Yes-second notification given    Louanne Belton 10/03/2014, 2:49 PM

## 2014-10-04 DIAGNOSIS — R0602 Shortness of breath: Secondary | ICD-10-CM

## 2014-10-04 LAB — CBC
HCT: 28.7 % — ABNORMAL LOW (ref 36.0–46.0)
Hemoglobin: 9.9 g/dL — ABNORMAL LOW (ref 12.0–15.0)
MCH: 31.8 pg (ref 26.0–34.0)
MCHC: 34.5 g/dL (ref 30.0–36.0)
MCV: 92.3 fL (ref 78.0–100.0)
Platelets: 299 10*3/uL (ref 150–400)
RBC: 3.11 MIL/uL — ABNORMAL LOW (ref 3.87–5.11)
RDW: 14.3 % (ref 11.5–15.5)
WBC: 14.4 10*3/uL — ABNORMAL HIGH (ref 4.0–10.5)

## 2014-10-04 LAB — BASIC METABOLIC PANEL
ANION GAP: 13 (ref 5–15)
BUN: 31 mg/dL — AB (ref 6–20)
CO2: 23 mmol/L (ref 22–32)
CREATININE: 1.11 mg/dL — AB (ref 0.44–1.00)
Calcium: 8.5 mg/dL — ABNORMAL LOW (ref 8.9–10.3)
Chloride: 98 mmol/L — ABNORMAL LOW (ref 101–111)
GFR calc non Af Amer: 44 mL/min — ABNORMAL LOW (ref >60)
GFR, EST AFRICAN AMERICAN: 51 mL/min — AB (ref >60)
Glucose, Bld: 163 mg/dL — ABNORMAL HIGH (ref 65–99)
Potassium: 3.7 mmol/L (ref 3.5–5.1)
Sodium: 134 mmol/L — ABNORMAL LOW (ref 135–145)

## 2014-10-04 LAB — CULTURE, RESPIRATORY W GRAM STAIN: Culture: NORMAL

## 2014-10-04 LAB — GLUCOSE, CAPILLARY
GLUCOSE-CAPILLARY: 175 mg/dL — AB (ref 65–99)
Glucose-Capillary: 217 mg/dL — ABNORMAL HIGH (ref 65–99)
Glucose-Capillary: 308 mg/dL — ABNORMAL HIGH (ref 65–99)
Glucose-Capillary: 219 mg/dL — ABNORMAL HIGH (ref 65–99)

## 2014-10-04 LAB — HEMOGLOBIN A1C
HEMOGLOBIN A1C: 6.6 % — AB (ref 4.8–5.6)
MEAN PLASMA GLUCOSE: 143 mg/dL

## 2014-10-04 LAB — CULTURE, RESPIRATORY

## 2014-10-04 MED ORDER — ZOLPIDEM TARTRATE 5 MG PO TABS
2.5000 mg | ORAL_TABLET | Freq: Once | ORAL | Status: AC
  Administered 2014-10-04: 2.5 mg via ORAL
  Filled 2014-10-04: qty 1

## 2014-10-04 MED ORDER — METHYLPREDNISOLONE SODIUM SUCC 125 MG IJ SOLR
60.0000 mg | Freq: Three times a day (TID) | INTRAMUSCULAR | Status: DC
  Administered 2014-10-04 – 2014-10-05 (×3): 60 mg via INTRAVENOUS
  Filled 2014-10-04 (×3): qty 0.96

## 2014-10-04 NOTE — Progress Notes (Signed)
[facility] PROGRESS NOTE  Shanequia E Oehler AQT:[id number] DOB: 1928-02-06 DOA: 10/01/2014 PCP: Horatio Pel, MD  Assessment/Plan: #1 acute respiratory failure with hypoxia Secondary to community acquired pneumonia with sepsis and lactic acidosis. Patient states improvement with her breathing. Patient 96% on 3 L nasal cannula. Patient with decreased wheezing, decreased rhonchi, decreased coarse breath sounds. Chest x-ray on admission with bilateral pneumonia. Continue oxygen, IV Rocephin and IV azithromycin, Pulmicort and Brovana. Continue Flonase. Continue IV steroids taper. Follow.  #2 bilateral community-acquired pneumonia Per chest x-ray. Sputum Gram stain and culture pending. Blood cultures pending. Patient is afebrile. Patient with a leukocytosis with a white count of 14.4 from12.9 however on IV steroids. Continue empiric IV antibiotics, oxygen, Pulmicort and Brovana, IV steroids taper. Follow.  #3 hypernatremia ?? Etiology. TSH at 3.077. CXR with CAP.Some improvement. NSL; IVF.  Resumed home diuretics.Follow.  #4 PAF s/p PPM Continue metoprolol and Cardizem for rate control. Continue apixaban for anticoagulation.  #5 hypothyroidism TSH within normal limits. Continue Synthroid.  #6 ?? Acute superimposed on chronic diastolic heart failure Was slightly decompensated 2 days ago and yesterday. 2-D echo from 04/19/2014 with a EF of 60-65% with grade 2 diastolic dysfunction. Patient feeling better. Patient was given a dose of IV Lasix yesterday with -2.425L output. Weight at 62.9kg. Resumed home dose torsemide. Follow.  #7 hyperlipidemia Continue Pravachol.  #8 leg injury status post multiple surgeries a right AKA patient with phantom leg pain. Continue diazepam and Flexeril. Outpatient follow-up.  #9 hypertension Continue metoprolol, cardizem and ARB. Monitor.   #10 anemia of chronic disease No overt bleeding. Follow H&H.   #11 prophylaxis PPI for GI prophylaxis. On  apixaban for DVT prophylaxis.     Code Status: Full Family Communication: Updated patient. No family present. Disposition Plan: Transfer to telemetry.   Consultants:  None  Procedures:  Chest x-ray 10/01/2014, 10/03/14  Antibiotics:  IV Rocephin 10/01/2014  IV azithromycin 10/01/2014  HPI/Subjective: Patient states her shortness of breath has improved. Feeling better. Patient denies any chest pain.  Objective: Filed Vitals:   10/04/14 0802  BP: 130/61  Pulse: 93  Temp: 97.4 F (36.3 C)  Resp: 17    Intake/Output Summary (Last 24 hours) at 10/04/14 0859 Last data filed at 10/04/14 0500  Gross per 24 hour  Intake    540 ml  Output   2725 ml  Net  -2185 ml   Filed Weights   10/01/14 1645  Weight: 62.9 kg (138 lb 10.7 oz)    Exam:   General:  Patient speaking in full sentences however with some audible wheezing.  Cardiovascular: Irregularly irregular.  Respiratory: Minimal expiratory wheezing, decreased coarse breath sounds.   Abdomen: Soft, nontender, nondistended, positive bowel sounds.  Musculoskeletal: No clubbing cyanosis or edema.  Data Reviewed: Basic Metabolic Panel:  Recent Labs Lab 10/01/14 1154 10/02/14 0400 10/03/14 0520 10/04/14 0300  NA 128* 126* 128* 134*  K 3.9 4.3 4.6 3.7  CL 95* 96* 96* 98*  CO2 20* 19* 19* 23  GLUCOSE 152* 361* 306* 163*  BUN 15 23* 25* 31*  CREATININE 0.88 1.11* 0.99 1.11*  CALCIUM 8.3* 7.7* 8.3* 8.5*  PHOS  --   --  2.7  --    Liver Function Tests:  Recent Labs Lab 10/01/14 1154 10/03/14 0520  AST 44*  --   ALT 38  --   ALKPHOS 99  --   BILITOT 0.7  --   PROT 6.3*  --   ALBUMIN 2.8* 2.5*  No results for input(s): LIPASE, AMYLASE in the last 168 hours. No results for input(s): AMMONIA in the last 168 hours. CBC:  Recent Labs Lab 10/01/14 1154 10/02/14 0400 10/03/14 0520 10/04/14 0300  WBC 9.2 7.9 12.9* 14.4*  NEUTROABS 7.4  --  11.6*  --   HGB 10.6* 9.5* 9.6* 9.9*  HCT 31.3*  27.7* 27.9* 28.7*  MCV 93.7 92.6 93.6 92.3  PLT 190 172 225 299   Cardiac Enzymes: No results for input(s): CKTOTAL, CKMB, CKMBINDEX, TROPONINI in the last 168 hours. BNP (last 3 results) No results for input(s): BNP in the last 8760 hours.  ProBNP (last 3 results) No results for input(s): PROBNP in the last 8760 hours.  CBG:  Recent Labs Lab 10/03/14 1259 10/03/14 1616 10/03/14 2145 10/04/14 0804  GLUCAP 353* 242* 126* 175*    Recent Results (from the past 240 hour(s))  Culture, blood (routine x 2)     Status: None (Preliminary result)   Collection Time: 10/01/14 12:50 PM  Result Value Ref Range Status   Specimen Description BLOOD RIGHT FOREARM  Final   Special Requests BOTTLES DRAWN AEROBIC AND ANAEROBIC 5CC  Final   Culture NO GROWTH 2 DAYS  Final   Report Status PENDING  Incomplete  Culture, blood (routine x 2)     Status: None (Preliminary result)   Collection Time: 10/01/14  1:03 PM  Result Value Ref Range Status   Specimen Description BLOOD LEFT ANTECUBITAL  Final   Special Requests BOTTLES DRAWN AEROBIC AND ANAEROBIC 5CC  Final   Culture NO GROWTH 2 DAYS  Final   Report Status PENDING  Incomplete  MRSA PCR Screening     Status: None   Collection Time: 10/01/14  3:40 PM  Result Value Ref Range Status   MRSA by PCR NEGATIVE NEGATIVE Final    Comment:        The GeneXpert MRSA Assay (FDA approved for NASAL specimens only), is one component of a comprehensive MRSA colonization surveillance program. It is not intended to diagnose MRSA infection nor to guide or monitor treatment for MRSA infections.   Culture, sputum-assessment     Status: None   Collection Time: 10/01/14  4:08 PM  Result Value Ref Range Status   Specimen Description SPUTUM  Final   Special Requests NONE  Final   Sputum evaluation   Final    THIS SPECIMEN IS ACCEPTABLE. RESPIRATORY CULTURE REPORT TO FOLLOW.   Report Status 10/01/2014 FINAL  Final  Culture, respiratory (NON-Expectorated)      Status: None   Collection Time: 10/01/14  4:08 PM  Result Value Ref Range Status   Specimen Description SPUTUM  Final   Special Requests NONE  Final   Gram Stain   Final    MODERATE WBC PRESENT,BOTH PMN AND MONONUCLEAR FEW SQUAMOUS EPITHELIAL CELLS PRESENT ABUNDANT GRAM POSITIVE COCCI IN CLUSTERS IN PAIRS IN CHAINS FEW GRAM NEGATIVE RODS FEW GRAM POSITIVE RODS    Culture   Final    NORMAL OROPHARYNGEAL FLORA Performed at [facility]    Report Status 10/04/2014 FINAL  Final  Culture, Urine     Status: None   Collection Time: 10/02/14  2:27 AM  Result Value Ref Range Status   Specimen Description URINE, CLEAN CATCH  Final   Special Requests NONE  Final   Culture   Final    MULTIPLE SPECIES PRESENT, SUGGEST RECOLLECTION IF CLINICALLY INDICATED   Report Status 10/03/2014 FINAL  Final     Studies: Dg  Chest Port 1 View  10/03/2014   CLINICAL DATA:  Shortness of breath.  EXAM: PORTABLE CHEST - 1 VIEW  COMPARISON:  10/01/2014  FINDINGS: Cardiomediastinal silhouette is unchanged. Heart size is within normal limits. Left-sided dual lead pacemaker remains in place. Right greater than left perihilar opacities on the prior study have increased, now with involvement of both lung bases. A right pleural effusion is not excluded. No pneumothorax is seen.  IMPRESSION: Worsening bilateral airspace opacities, pneumonia versus edema.   Electronically Signed   By: Logan Bores   On: 10/03/2014 09:26    Scheduled Meds: . apixaban  2.5 mg Oral BID  . arformoterol  15 mcg Nebulization BID  . azithromycin  500 mg Intravenous Q24H  . budesonide (PULMICORT) nebulizer solution  0.25 mg Nebulization BID  . cefTRIAXone (ROCEPHIN)  IV  1 g Intravenous Q24H  . conjugated estrogens  1 Applicatorful Vaginal See admin instructions  . cycloSPORINE  1 drop Both Eyes BID  . diltiazem  360 mg Oral Daily  . estradiol  0.5 mg Oral QPM  . fluticasone  1 spray Each Nare Daily  . insulin aspart  0-20 Units  Subcutaneous TID WC  . irbesartan  300 mg Oral QHS  . levothyroxine  100 mcg Oral Once per day on Sun Mon Tue Thu Fri Sat  . levothyroxine  50 mcg Oral Once per day on Wed  . loratadine  10 mg Oral Daily  . methylPREDNISolone (SOLU-MEDROL) injection  60 mg Intravenous Q6H  . metoprolol  100 mg Oral BID  . montelukast  10 mg Oral Daily  . omega-3 acid ethyl esters  1,000 mg Oral Daily  . pantoprazole  40 mg Oral Daily  . pravastatin  40 mg Oral QPM  . torsemide  20 mg Oral Daily   Continuous Infusions:   Principal Problem:   Acute respiratory failure with hypoxia Active Problems:   GERD   Hypothyroid   Hyperlipidemia   PAF (paroxysmal atrial fibrillation)   Pacemaker   Chronic diastolic heart failure   Community acquired pneumonia   Hyponatremia   CAP (community acquired pneumonia)   Pneumonia   Anemia    Time spent: 24 mins    [facility] MD [facility] Pager [phone]. If 7PM-7AM, please contact night-coverage at [email or web address], password [facility] 10/04/2014, 8:59 AM  LOS: 3 days

## 2014-10-04 NOTE — Progress Notes (Signed)
Physical Therapy Treatment Patient Details Name: Kelsey Joseph MRN: [id number] DOB: April 29, 1927 Today's Date: 10/04/2014    History of Present Illness Patient is a 81 year old female with paroxysmal atrial fibrillation on anticoagulation, right AKA, pacemaker, hypothyroidism presented to ED with fever, cough and congestion. Pt with bil PNA    PT Comments    Ms.Mauney continues to move well and benefits from encouragement, frequent rests between transfers and activity due to sOB. Pt with sats 95% on RA at rest with drop to 82% with limited gait, recovery to 91% with 1 min seated rest. Pt able to maintain sats 94% on 1L with HEP and sit to stand trials. HR 76-96 throughout.   Follow Up Recommendations  No PT follow up     Equipment Recommendations       Recommendations for Other Services       Precautions / Restrictions Precautions Precautions: Fall Precaution Comments: watch sats, R AKA Restrictions Weight Bearing Restrictions: Yes RLE Weight Bearing: Non weight bearing    Mobility  Bed Mobility Overal bed mobility: Modified Independent                Transfers Overall transfer level: Needs assistance     Sit to Stand: Supervision Stand pivot transfers: Supervision       General transfer comment: setup and line management as well as cues for controlled descent. Sit to stand x 5 from chair to practice controlled descent. Initial pivot to chair with decreased control and cues  Ambulation/Gait Ambulation/Gait assistance: Min guard Ambulation Distance (Feet): 8 Feet Assistive device: Rolling walker (2 wheeled) Gait Pattern/deviations: Step-to pattern     General Gait Details: cues for position in RW. Pt moving well but states she continues to prefer crutches   Stairs            Wheelchair Mobility    Modified Rankin (Stroke Patients Only)       Balance Overall balance assessment: Needs assistance   Sitting balance-Leahy Scale: Good        Standing balance-Leahy Scale: Poor                      Cognition Arousal/Alertness: Awake/alert Behavior During Therapy: WFL for tasks assessed/performed Overall Cognitive Status: Within Functional Limits for tasks assessed                      Exercises General Exercises - Lower Extremity Long Arc Quad: AROM;Seated;Left;15 reps Hip Flexion/Marching: AROM;Seated;Left;15 reps    General Comments        Pertinent Vitals/Pain Pain Assessment: No/denies pain    Home Living                      Prior Function            PT Goals (current goals can now be found in the care plan section) Progress towards PT goals: Progressing toward goals    Frequency       PT Plan Current plan remains appropriate    Co-evaluation             End of Session Equipment Utilized During Treatment: Gait belt Activity Tolerance: Patient tolerated treatment well Patient left: in chair;with call bell/phone within reach     Time: 1004-1030 PT Time Calculation (min) (ACUTE ONLY): 26 min  Charges:  $Therapeutic Exercise: 8-22 mins $Therapeutic Activity: 8-22 mins  G CodesMelford Aase 2014/10/21, 10:33 AM Elwyn Reach, [place]

## 2014-10-04 NOTE — Evaluation (Signed)
Occupational Therapy Evaluation Patient Details Name: Kelsey Joseph MRN: [id number] DOB: [birth year] Today's Date: 10/04/2014    History of Present Illness Patient is a 81 year old female with paroxysmal atrial fibrillation on anticoagulation, right AKA, pacemaker, hypothyroidism presented to ED with fever, cough and congestion. Pt with bil PNA   Clinical Impression   Pt performed ADL and IADL at a modified independent level from her scooter PTA.  She was involved in water exercise class 3 days a week.  Pt does not currently use her R LE prosthesis, but wants to return to use. Pt presents with generalized weakness and decreased activity tolerance as a result of above diagnosis.  Began instruction in energy conservation and pacing. Pt was particularly tired due to lack of sleep.  Will follow acutely. Do not anticipate pt will need post acute OT.    Follow Up Recommendations  No OT follow up    Equipment Recommendations  None recommended by OT    Recommendations for Other Services       Precautions / Restrictions Precautions Precautions: Fall Precaution Comments: watch sats, R AKA Restrictions Weight Bearing Restrictions: No       Mobility Bed Mobility Overal bed mobility: Modified Independent                Transfers Overall transfer level: Needs assistance Equipment used: None Transfers: Sit to/from Stand;Stand Pivot Transfers Sit to Stand: Supervision Stand pivot transfers: Supervision       General transfer comment: supervision for safety and line management bed<>BSC    Balance Overall balance assessment: Needs assistance   Sitting balance-Leahy Scale: Good                                      ADL Overall ADL's : Needs assistance/impaired Eating/Feeding: Independent;Sitting   Grooming: Wash/dry hands;Wash/dry face;Sitting;Set up   Upper Body Bathing: Set up;Sitting   Lower Body Bathing: Supervison/ safety;Sitting/lateral leans    Upper Body Dressing : Set up;Sitting   Lower Body Dressing: Supervision/safety;Sit to/from stand   Toilet Transfer: Supervision/safety;Stand-pivot;BSC   Toileting- Water quality scientist and Hygiene: Supervision/safety;Sitting/lateral lean         General ADL Comments: Limited by decreased activity tolerance and need for frequent rest breaks.     Vision     Perception     Praxis      Pertinent Vitals/Pain Pain Assessment: No/denies pain     Hand Dominance Right   Extremity/Trunk Assessment Upper Extremity Assessment Upper Extremity Assessment: Overall WFL for tasks assessed   Lower Extremity Assessment Lower Extremity Assessment:  (R AKA)   Cervical / Trunk Assessment Cervical / Trunk Assessment: Normal   Communication Communication Communication: HOH   Cognition Arousal/Alertness: Awake/alert Behavior During Therapy: WFL for tasks assessed/performed Overall Cognitive Status: Within Functional Limits for tasks assessed                     General Comments       Exercises       Shoulder Instructions      Home Living Family/patient expects to be discharged to:: Private residence Living Arrangements: Alone Available Help at Discharge: Available PRN/intermittently;Family Type of Home: House Home Access: Level entry     Home Layout: One level     Bathroom Shower/Tub: Astronomer Accessibility: Yes How Accessible: Accessible via wheelchair Home Equipment: Crutches;Electric scooter;Shower seat - built in  Prior Functioning/Environment Level of Independence: Independent with assistive device(s)        Comments: assist  for housework 1x/wk, manages from scooter level for last 1.5 years    OT Diagnosis: Generalized weakness   OT Problem List: Decreased activity tolerance;Decreased strength;Cardiopulmonary status limiting activity;Impaired balance (sitting and/or standing)   OT Treatment/Interventions:  Self-care/ADL training;Energy conservation;Patient/family education    OT Goals(Current goals can be found in the care plan section) Acute Rehab OT Goals Patient Stated Goal: return home OT Goal Formulation: With patient Time For Goal Achievement: 10/11/14 Potential to Achieve Goals: Good ADL Goals Pt Will Perform Lower Body Bathing: with modified independence;sitting/lateral leans;sit to/from stand (at sink) Pt Will Perform Lower Body Dressing: with modified independence;sit to/from stand;sitting/lateral leans (at sink) Pt Will Transfer to Toilet: with modified independence;stand pivot transfer;bedside commode Pt Will Perform Toileting - Clothing Manipulation and hygiene: with modified independence;sitting/lateral leans;sit to/from stand Additional ADL Goal #1: Pt will generalize energy conservation strategies in ADL independently.  OT Frequency: Min 2X/week   Barriers to D/C:            Co-evaluation              End of Session    Activity Tolerance: Patient limited by fatigue Patient left: in bed;with call bell/phone within reach   Time: 1525-1541 OT Time Calculation (min): 16 min Charges:  OT General Charges $OT Visit: 1 Procedure OT Evaluation $Initial OT Evaluation Tier I: 1 Procedure G-Codes:    [name] 10/04/2014, 3:53 PM  [phone]

## 2014-10-04 NOTE — Progress Notes (Signed)
Transferred to [address]  room 27 by wheelchair, report given to RN, belongings  with pt.

## 2014-10-05 LAB — CBC WITH DIFFERENTIAL/PLATELET
BASOS PCT: 0 % (ref 0–1)
Basophils Absolute: 0 10*3/uL (ref 0.0–0.1)
EOS PCT: 0 % (ref 0–5)
Eosinophils Absolute: 0 10*3/uL (ref 0.0–0.7)
HEMATOCRIT: 29 % — AB (ref 36.0–46.0)
HEMOGLOBIN: 9.9 g/dL — AB (ref 12.0–15.0)
Lymphocytes Relative: 12 % (ref 12–46)
Lymphs Abs: 2.2 10*3/uL (ref 0.7–4.0)
MCH: 31.3 pg (ref 26.0–34.0)
MCHC: 34.1 g/dL (ref 30.0–36.0)
MCV: 91.8 fL (ref 78.0–100.0)
Monocytes Absolute: 0.5 10*3/uL (ref 0.1–1.0)
Monocytes Relative: 3 % (ref 3–12)
Neutro Abs: 15.5 10*3/uL — ABNORMAL HIGH (ref 1.7–7.7)
Neutrophils Relative %: 85 % — ABNORMAL HIGH (ref 43–77)
Platelets: 382 10*3/uL (ref 150–400)
RBC: 3.16 MIL/uL — ABNORMAL LOW (ref 3.87–5.11)
RDW: 14.4 % (ref 11.5–15.5)
WBC: 18.2 10*3/uL — ABNORMAL HIGH (ref 4.0–10.5)

## 2014-10-05 LAB — BASIC METABOLIC PANEL
ANION GAP: 11 (ref 5–15)
BUN: 46 mg/dL — ABNORMAL HIGH (ref 6–20)
CO2: 23 mmol/L (ref 22–32)
CREATININE: 1.42 mg/dL — AB (ref 0.44–1.00)
Calcium: 8.5 mg/dL — ABNORMAL LOW (ref 8.9–10.3)
Chloride: 101 mmol/L (ref 101–111)
GFR calc Af Amer: 38 mL/min — ABNORMAL LOW (ref >60)
GFR calc non Af Amer: 33 mL/min — ABNORMAL LOW (ref >60)
Glucose, Bld: 197 mg/dL — ABNORMAL HIGH (ref 65–99)
Potassium: 3.7 mmol/L (ref 3.5–5.1)
Sodium: 135 mmol/L (ref 135–145)

## 2014-10-05 LAB — GLUCOSE, CAPILLARY
GLUCOSE-CAPILLARY: 281 mg/dL — AB (ref 65–99)
Glucose-Capillary: 197 mg/dL — ABNORMAL HIGH (ref 65–99)
Glucose-Capillary: 170 mg/dL — ABNORMAL HIGH (ref 65–99)
Glucose-Capillary: 266 mg/dL — ABNORMAL HIGH (ref 65–99)

## 2014-10-05 LAB — PROCALCITONIN

## 2014-10-05 MED ORDER — SODIUM CHLORIDE 0.9 % IV SOLN
INTRAVENOUS | Status: AC
  Administered 2014-10-05: 1000 mL via INTRAVENOUS

## 2014-10-05 MED ORDER — LEVOFLOXACIN 750 MG PO TABS
750.0000 mg | ORAL_TABLET | Freq: Once | ORAL | Status: AC
  Administered 2014-10-05: 750 mg via ORAL
  Filled 2014-10-05: qty 1

## 2014-10-05 MED ORDER — AMOXICILLIN-POT CLAVULANATE 875-125 MG PO TABS: 1.0000 | ORAL_TABLET | Freq: Two times a day (BID) | ORAL | Status: DC

## 2014-10-05 MED ORDER — PREDNISONE 50 MG PO TABS
60.0000 mg | ORAL_TABLET | Freq: Two times a day (BID) | ORAL | Status: DC
  Administered 2014-10-05 – 2014-10-06 (×3): 60 mg via ORAL
  Filled 2014-10-05 (×5): qty 1

## 2014-10-05 MED ORDER — LEVOFLOXACIN 500 MG PO TABS: 500.0000 mg | ORAL_TABLET | ORAL | Status: DC

## 2014-10-05 MED ORDER — ZOLPIDEM TARTRATE 5 MG PO TABS
5.0000 mg | ORAL_TABLET | Freq: Once | ORAL | Status: AC
  Administered 2014-10-05: 5 mg via ORAL
  Filled 2014-10-05: qty 1

## 2014-10-05 MED ORDER — AMOXICILLIN-POT CLAVULANATE 500-125 MG PO TABS
1.0000 | ORAL_TABLET | Freq: Two times a day (BID) | ORAL | Status: DC
  Administered 2014-10-05 – 2014-10-07 (×4): 500 mg via ORAL
  Filled 2014-10-05 (×5): qty 1

## 2014-10-05 NOTE — Progress Notes (Addendum)
[facility] PROGRESS NOTE  Kaylla E Yerby EXN:[id number] DOB: [birth year] DOA: 10/01/2014 PCP: Horatio Pel, MD  Assessment/Plan: #1 acute respiratory failure with hypoxia Secondary to community acquired pneumonia with sepsis and lactic acidosis. Patient states improvement with her breathing. Patient 97% on 1 L nasal cannula. Patient with decreased wheezing, decreased rhonchi, decreased coarse breath sounds. Chest x-ray on admission with bilateral pneumonia. Continue oxygen, Change IV Rocephin and IV azithromycin to oral augmentin. Continue Pulmicort and Brovana. Continue Flonase. Change IV steroids to oral steriods taper. Follow.  #2 bilateral community-acquired pneumonia Per chest x-ray. Sputum Gram stain and culture pending. Blood cultures pending. Patient is afebrile. Patient with a leukocytosis with a white count of  18.2 from 14.4 from12.9 however on IV steroids. Change IV antibiotics to oral antibiotics. Continue oxygen, Pulmicort and Brovana, Change IV steroids to oral steriod taper. Follow.  #3 hyponatremia ?? Etiology. TSH at 3.077. CXR with CAP.Some improvement. NSL; IVF.  Resumed home diuretics.Follow.  #4 PAF s/p PPM Continue metoprolol and Cardizem for rate control. Continue apixaban for anticoagulation.  #5 hypothyroidism TSH within normal limits. Continue Synthroid.  #6 ?? Acute superimposed on chronic diastolic heart failure Was slightly decompensated 2 days ago and yesterday. 2-D echo from 04/19/2014 with a EF of 60-65% with grade 2 diastolic dysfunction. Patient feeling better. Patient was given a dose of IV Lasix 2 days ago with -2.425L output. Weight at 65.4kg. Resumed home dose torsemide. Monitor renal function. Follow.  #7 hyperlipidemia Continue Pravachol.  #8 leg injury status post multiple surgeries a right AKA patient with phantom leg pain. Continue diazepam and Flexeril. Outpatient follow-up.  #9 hypertension Continue metoprolol, cardizem and  ARB. Monitor.   #10 anemia of chronic disease No overt bleeding. Follow H&H.   #11 Elevated Creatinine Will place on gentle hydration and follow. If no improvement in 1 day, will d/c avapro and toresemide. Follow.  #12 prophylaxis PPI for GI prophylaxis. On apixaban for DVT prophylaxis.     Code Status: Full Family Communication: Updated patient. No family present. Disposition Plan: Remain on telemetry.   Consultants:  None  Procedures:  Chest x-ray 10/01/2014, 10/03/14  Antibiotics:  IV Rocephin 10/01/2014>>>>>10/05/14  IV azithromycin 10/01/2014>>>>10/05/14  Augmentin 10/05/14  Levaquin 10/05/14>>>>10/05/14  HPI/Subjective: Patient states her shortness of breath has improved. Feeling better. Patient denies any chest pain.  Objective: Filed Vitals:   10/05/14 1050  BP: 144/64  Pulse:   Temp:   Resp:     Intake/Output Summary (Last 24 hours) at 10/05/14 1200 Last data filed at 10/05/14 0920  Gross per 24 hour  Intake   1040 ml  Output    200 ml  Net    840 ml   Filed Weights   10/01/14 1645 10/04/14 1237 10/05/14 0624  Weight: 62.9 kg (138 lb 10.7 oz) 63.594 kg (140 lb 3.2 oz) 65.454 kg (144 lb 4.8 oz)    Exam:   General:  Patient speaking in full sentences.  Cardiovascular: Irregularly irregular.  Respiratory: Minimal expiratory wheezing, decreased coarse breath sounds.   Abdomen: Soft, nontender, nondistended, positive bowel sounds.  Musculoskeletal: No clubbing cyanosis or edema.  Data Reviewed: Basic Metabolic Panel:  Recent Labs Lab 10/01/14 1154 10/02/14 0400 10/03/14 0520 10/04/14 0300 10/05/14 0423  NA 128* 126* 128* 134* 135  K 3.9 4.3 4.6 3.7 3.7  CL 95* 96* 96* 98* 101  CO2 20* 19* 19* 23 23  GLUCOSE 152* 361* 306* 163* 197*  BUN 15 23* 25* 31* 46*  CREATININE 0.88  1.11* 0.99 1.11* 1.42*  CALCIUM 8.3* 7.7* 8.3* 8.5* 8.5*  PHOS  --   --  2.7  --   --    Liver Function Tests:  Recent Labs Lab 10/01/14 1154 10/03/14 0520   AST 44*  --   ALT 38  --   ALKPHOS 99  --   BILITOT 0.7  --   PROT 6.3*  --   ALBUMIN 2.8* 2.5*   No results for input(s): LIPASE, AMYLASE in the last 168 hours. No results for input(s): AMMONIA in the last 168 hours. CBC:  Recent Labs Lab 10/01/14 1154 10/02/14 0400 10/03/14 0520 10/04/14 0300 10/05/14 0423  WBC 9.2 7.9 12.9* 14.4* 18.2*  NEUTROABS 7.4  --  11.6*  --  15.5*  HGB 10.6* 9.5* 9.6* 9.9* 9.9*  HCT 31.3* 27.7* 27.9* 28.7* 29.0*  MCV 93.7 92.6 93.6 92.3 91.8  PLT 190 172 225 299 382   Cardiac Enzymes: No results for input(s): CKTOTAL, CKMB, CKMBINDEX, TROPONINI in the last 168 hours. BNP (last 3 results) No results for input(s): BNP in the last 8760 hours.  ProBNP (last 3 results) No results for input(s): PROBNP in the last 8760 hours.  CBG:  Recent Labs Lab 10/04/14 1216 10/04/14 1735 10/04/14 2104 10/05/14 0614 10/05/14 1141  GLUCAP 217* 308* 219* 197* 170*    Recent Results (from the past 240 hour(s))  Culture, blood (routine x 2)     Status: None (Preliminary result)   Collection Time: 10/01/14 12:50 PM  Result Value Ref Range Status   Specimen Description BLOOD RIGHT FOREARM  Final   Special Requests BOTTLES DRAWN AEROBIC AND ANAEROBIC 5CC  Final   Culture NO GROWTH 3 DAYS  Final   Report Status PENDING  Incomplete  Culture, blood (routine x 2)     Status: None (Preliminary result)   Collection Time: 10/01/14  1:03 PM  Result Value Ref Range Status   Specimen Description BLOOD LEFT ANTECUBITAL  Final   Special Requests BOTTLES DRAWN AEROBIC AND ANAEROBIC 5CC  Final   Culture NO GROWTH 3 DAYS  Final   Report Status PENDING  Incomplete  MRSA PCR Screening     Status: None   Collection Time: 10/01/14  3:40 PM  Result Value Ref Range Status   MRSA by PCR NEGATIVE NEGATIVE Final    Comment:        The GeneXpert MRSA Assay (FDA approved for NASAL specimens only), is one component of a comprehensive MRSA colonization surveillance  program. It is not intended to diagnose MRSA infection nor to guide or monitor treatment for MRSA infections.   Culture, sputum-assessment     Status: None   Collection Time: 10/01/14  4:08 PM  Result Value Ref Range Status   Specimen Description SPUTUM  Final   Special Requests NONE  Final   Sputum evaluation   Final    THIS SPECIMEN IS ACCEPTABLE. RESPIRATORY CULTURE REPORT TO FOLLOW.   Report Status 10/01/2014 FINAL  Final  Culture, respiratory (NON-Expectorated)     Status: None   Collection Time: 10/01/14  4:08 PM  Result Value Ref Range Status   Specimen Description SPUTUM  Final   Special Requests NONE  Final   Gram Stain   Final    MODERATE WBC PRESENT,BOTH PMN AND MONONUCLEAR FEW SQUAMOUS EPITHELIAL CELLS PRESENT ABUNDANT GRAM POSITIVE COCCI IN CLUSTERS IN PAIRS IN CHAINS FEW GRAM NEGATIVE RODS FEW GRAM POSITIVE RODS    Culture   Final    NORMAL  OROPHARYNGEAL FLORA Performed at [facility]    Report Status 10/04/2014 FINAL  Final  Culture, Urine     Status: None   Collection Time: 10/02/14  2:27 AM  Result Value Ref Range Status   Specimen Description URINE, CLEAN CATCH  Final   Special Requests NONE  Final   Culture   Final    MULTIPLE SPECIES PRESENT, SUGGEST RECOLLECTION IF CLINICALLY INDICATED   Report Status 10/03/2014 FINAL  Final     Studies: No results found.  Scheduled Meds: . apixaban  2.5 mg Oral BID  . arformoterol  15 mcg Nebulization BID  . azithromycin  500 mg Intravenous Q24H  . budesonide (PULMICORT) nebulizer solution  0.25 mg Nebulization BID  . cefTRIAXone (ROCEPHIN)  IV  1 g Intravenous Q24H  . conjugated estrogens  1 Applicatorful Vaginal See admin instructions  . cycloSPORINE  1 drop Both Eyes BID  . diltiazem  360 mg Oral Daily  . estradiol  0.5 mg Oral QPM  . fluticasone  1 spray Each Nare Daily  . insulin aspart  0-20 Units Subcutaneous TID WC  . irbesartan  300 mg Oral QHS  . levothyroxine  100 mcg Oral Once per day  on Sun Mon Tue Thu Fri Sat  . levothyroxine  50 mcg Oral Once per day on Wed  . loratadine  10 mg Oral Daily  . metoprolol  100 mg Oral BID  . montelukast  10 mg Oral Daily  . omega-3 acid ethyl esters  1,000 mg Oral Daily  . pantoprazole  40 mg Oral Daily  . pravastatin  40 mg Oral QPM  . predniSONE  60 mg Oral BID WC  . torsemide  20 mg Oral Daily   Continuous Infusions: . sodium chloride 1,000 mL (10/05/14 0946)    Principal Problem:   Acute respiratory failure with hypoxia Active Problems:   GERD   Hypothyroid   Hyperlipidemia   PAF (paroxysmal atrial fibrillation)   Pacemaker   Chronic diastolic heart failure   Community acquired pneumonia   Hyponatremia   CAP (community acquired pneumonia)   Pneumonia   Anemia   SOB (shortness of breath)    Time spent: 40 mins    [facility] MD [facility] Pager [phone]. If 7PM-7AM, please contact night-coverage at [email or web address], password [facility] 10/05/2014, 12:00 PM  LOS: 4 days

## 2014-10-05 NOTE — Progress Notes (Signed)
Pt c/o LLQ abdominal pain overnight, pt states it feels like a muscle pain, states it hurts when she sits up and moves around and coughed. Pt repositioned throughout the night and given Norco for pain.

## 2014-10-05 NOTE — Progress Notes (Signed)
Physical Therapy Treatment Patient Details Name: Lashonta E Slavick MRN: [id number] DOB: 1928-01-26 Today's Date: 10/05/2014    History of Present Illness Patient is a 81 year old female with paroxysmal atrial fibrillation on anticoagulation, right AKA, pacemaker, hypothyroidism presented to ED with fever, cough and congestion. Pt with bil PNA    PT Comments    Patient having some stomach discomfort and deferred ambulation but wanted to get up to recliner. Patient also stated that she doesn't walk at home. Can check back with patient on Monday to see if she would like to attempt to ambulate.   Follow Up Recommendations  No PT follow up     Equipment Recommendations  None recommended by PT    Recommendations for Other Services       Precautions / Restrictions Precautions Precautions: Fall Precaution Comments: watch sats, R AKA Restrictions Weight Bearing Restrictions: No RLE Weight Bearing:  (Patient has right AKA)    Mobility  Bed Mobility Overal bed mobility: Modified Independent                Transfers Overall transfer level: Needs assistance Equipment used: None   Sit to Stand: Supervision Stand pivot transfers: Supervision       General transfer comment: supervision for safety and line management bed<>recliner  Ambulation/Gait             General Gait Details: Did not want to attempt due to stomach being unsettled   Stairs            Wheelchair Mobility    Modified Rankin (Stroke Patients Only)       Balance                                    Cognition Arousal/Alertness: Awake/alert Behavior During Therapy: WFL for tasks assessed/performed Overall Cognitive Status: Within Functional Limits for tasks assessed                      Exercises      General Comments        Pertinent Vitals/Pain Pain Assessment: No/denies pain    Home Living                      Prior Function            PT  Goals (current goals can now be found in the care plan section) Progress towards PT goals: Progressing toward goals    Frequency  Min 3X/week    PT Plan Current plan remains appropriate    Co-evaluation             End of Session   Activity Tolerance: Patient tolerated treatment well Patient left: in chair;with call bell/phone within reach     Time: 0920-0936 PT Time Calculation (min) (ACUTE ONLY): 16 min  Charges:  $Therapeutic Activity: 8-22 mins                    G Codes:      Jacqualyn Posey 10/05/2014, 10:27 AM 10/05/2014 Jacqualyn Posey PTA [phone] pager [phone] office

## 2014-10-06 LAB — CBC
HCT: 27.7 % — ABNORMAL LOW (ref 36.0–46.0)
HEMOGLOBIN: 9.6 g/dL — AB (ref 12.0–15.0)
MCH: 31.7 pg (ref 26.0–34.0)
MCHC: 34.7 g/dL (ref 30.0–36.0)
MCV: 91.4 fL (ref 78.0–100.0)
Platelets: 396 10*3/uL (ref 150–400)
RBC: 3.03 MIL/uL — ABNORMAL LOW (ref 3.87–5.11)
RDW: 14.4 % (ref 11.5–15.5)
WBC: 17.2 10*3/uL — ABNORMAL HIGH (ref 4.0–10.5)

## 2014-10-06 LAB — CULTURE, BLOOD (ROUTINE X 2)
Culture: NO GROWTH
Culture: NO GROWTH

## 2014-10-06 LAB — GLUCOSE, CAPILLARY
Glucose-Capillary: 220 mg/dL — ABNORMAL HIGH (ref 65–99)
Glucose-Capillary: 184 mg/dL — ABNORMAL HIGH (ref 65–99)
Glucose-Capillary: 229 mg/dL — ABNORMAL HIGH (ref 65–99)
Glucose-Capillary: 211 mg/dL — ABNORMAL HIGH (ref 65–99)

## 2014-10-06 LAB — BASIC METABOLIC PANEL
ANION GAP: 12 (ref 5–15)
BUN: 50 mg/dL — ABNORMAL HIGH (ref 6–20)
CALCIUM: 8.2 mg/dL — AB (ref 8.9–10.3)
CHLORIDE: 101 mmol/L (ref 101–111)
CO2: 24 mmol/L (ref 22–32)
Creatinine, Ser: 1.21 mg/dL — ABNORMAL HIGH (ref 0.44–1.00)
GFR calc Af Amer: 46 mL/min — ABNORMAL LOW (ref >60)
GFR, EST NON AFRICAN AMERICAN: 40 mL/min — AB (ref >60)
Glucose, Bld: 230 mg/dL — ABNORMAL HIGH (ref 65–99)
Potassium: 3.5 mmol/L (ref 3.5–5.1)
Sodium: 137 mmol/L (ref 135–145)

## 2014-10-06 MED ORDER — PREDNISONE 50 MG PO TABS
60.0000 mg | ORAL_TABLET | Freq: Every day | ORAL | Status: DC
  Administered 2014-10-07: 60 mg via ORAL
  Filled 2014-10-06 (×2): qty 1

## 2014-10-06 MED ORDER — ZOLPIDEM TARTRATE 5 MG PO TABS
5.0000 mg | ORAL_TABLET | Freq: Once | ORAL | Status: AC
  Administered 2014-10-06: 5 mg via ORAL
  Filled 2014-10-06: qty 1

## 2014-10-06 MED ORDER — POTASSIUM CHLORIDE CRYS ER 20 MEQ PO TBCR
40.0000 meq | EXTENDED_RELEASE_TABLET | Freq: Once | ORAL | Status: AC
  Administered 2014-10-06: 40 meq via ORAL
  Filled 2014-10-06: qty 2

## 2014-10-06 MED ORDER — TORSEMIDE 20 MG PO TABS
20.0000 mg | ORAL_TABLET | Freq: Every day | ORAL | Status: DC
  Administered 2014-10-07: 20 mg via ORAL
  Filled 2014-10-06: qty 1

## 2014-10-06 NOTE — Progress Notes (Signed)
[facility] PROGRESS NOTE  Kelsey Joseph ZJI:[id number] DOB: 09/20/1927 DOA: 10/01/2014 PCP: Horatio Pel, MD  Assessment/Plan: #1 acute respiratory failure with hypoxia Secondary to community acquired pneumonia with sepsis and lactic acidosis. Patient states improvement with her breathing. Patient 97% on 1 L nasal cannula. Patient with decreased wheezing, decreased rhonchi, decreased coarse breath sounds. Chest x-ray on admission with bilateral pneumonia. Continue oxygen, oral augmentin, Pulmicort and Brovana. Continue Flonase. Continue oral steriods taper. Follow.  #2 bilateral community-acquired pneumonia Per chest x-ray. Sputum Gram stain and culture pending. Blood cultures pending. Patient is afebrile. Patient with a leukocytosis with a white count of  17.2 from 18.2 from 14.4 from12.9 however on IV steroids. Continue oral Augmentin to complete a one-week course of antibiotic therapy. Continue oxygen, Pulmicort and Brovana, continue oral steriod taper. Follow.  #3 hyponatremia ?? Etiology. TSH at 3.077. CXR with CAP.Some improvement. NSL; IVF.  Follow.  #4 PAF s/p PPM Continue metoprolol and Cardizem for rate control. Continue apixaban for anticoagulation.  #5 hypothyroidism TSH within normal limits. Continue Synthroid.  #6 ?? Acute superimposed on chronic diastolic heart failure Was slightly decompensated 3 days ago and yesterday. 2-D echo from 04/19/2014 with a EF of 60-65% with grade 2 diastolic dysfunction. Patient feeling better. Patient was given a dose of IV Lasix 3 days ago with -2.425L output. Weight at 65.3kg. Resumed home dose torsemide tommorrow. Monitor renal function. Follow.  #7 hyperlipidemia Continue Pravachol.  #8 leg injury status post multiple surgeries a right AKA patient with phantom leg pain. Continue diazepam and Flexeril. Outpatient follow-up.  #9 hypertension Continue metoprolol, cardizem and ARB. Monitor.   #10 anemia of chronic  disease No overt bleeding. Follow H&H.   #11 Elevated Creatinine Improved with gentle hydration. Will hold torsemide today and resume tomorrow.   #12 prophylaxis PPI for GI prophylaxis. On apixaban for DVT prophylaxis.     Code Status: Full Family Communication: Updated patient. No family present. Disposition Plan: Home tomorrow hopefully.   Consultants:  None  Procedures:  Chest x-ray 10/01/2014, 10/03/14  Antibiotics:  IV Rocephin 10/01/2014>>>>>10/05/14  IV azithromycin 10/01/2014>>>>10/05/14  Augmentin 10/05/14  Levaquin 10/05/14>>>>10/05/14  HPI/Subjective: Patient sitting at sink washing up. Patient feeling better. Shortness of breath has improved.  Objective: Filed Vitals:   10/06/14 0535  BP: 97/74  Pulse: 76  Temp: 97.6 F (36.4 C)  Resp: 18    Intake/Output Summary (Last 24 hours) at 10/06/14 0945 Last data filed at 10/06/14 0600  Gross per 24 hour  Intake    960 ml  Output   2775 ml  Net  -1815 ml   Filed Weights   10/04/14 1237 10/05/14 0624 10/06/14 0535  Weight: 63.594 kg (140 lb 3.2 oz) 65.454 kg (144 lb 4.8 oz) 65.318 kg (144 lb)    Exam:   General:  Patient speaking in full sentences.  Cardiovascular: Irregularly irregular.  Respiratory: Minimal expiratory wheezing, decreased coarse breath sounds.   Abdomen: Soft, nontender, nondistended, positive bowel sounds.  Musculoskeletal: No clubbing cyanosis or edema.  Data Reviewed: Basic Metabolic Panel:  Recent Labs Lab 10/02/14 0400 10/03/14 0520 10/04/14 0300 10/05/14 0423 10/06/14 0300  NA 126* 128* 134* 135 137  K 4.3 4.6 3.7 3.7 3.5  CL 96* 96* 98* 101 101  CO2 19* 19* 23 23 24   GLUCOSE 361* 306* 163* 197* 230*  BUN 23* 25* 31* 46* 50*  CREATININE 1.11* 0.99 1.11* 1.42* 1.21*  CALCIUM 7.7* 8.3* 8.5* 8.5* 8.2*  PHOS  --  2.7  --   --   --  Liver Function Tests:  Recent Labs Lab 10/01/14 1154 10/03/14 0520  AST 44*  --   ALT 38  --   ALKPHOS 99  --   BILITOT  0.7  --   PROT 6.3*  --   ALBUMIN 2.8* 2.5*   No results for input(s): LIPASE, AMYLASE in the last 168 hours. No results for input(s): AMMONIA in the last 168 hours. CBC:  Recent Labs Lab 10/01/14 1154 10/02/14 0400 10/03/14 0520 10/04/14 0300 10/05/14 0423 10/06/14 0300  WBC 9.2 7.9 12.9* 14.4* 18.2* 17.2*  NEUTROABS 7.4  --  11.6*  --  15.5*  --   HGB 10.6* 9.5* 9.6* 9.9* 9.9* 9.6*  HCT 31.3* 27.7* 27.9* 28.7* 29.0* 27.7*  MCV 93.7 92.6 93.6 92.3 91.8 91.4  PLT 190 172 225 299 382 396   Cardiac Enzymes: No results for input(s): CKTOTAL, CKMB, CKMBINDEX, TROPONINI in the last 168 hours. BNP (last 3 results) No results for input(s): BNP in the last 8760 hours.  ProBNP (last 3 results) No results for input(s): PROBNP in the last 8760 hours.  CBG:  Recent Labs Lab 10/05/14 0614 10/05/14 1141 10/05/14 1641 10/05/14 2057 10/06/14 0617  GLUCAP 197* 170* 266* 281* 220*    Recent Results (from the past 240 hour(s))  Culture, blood (routine x 2)     Status: None (Preliminary result)   Collection Time: 10/01/14 12:50 PM  Result Value Ref Range Status   Specimen Description BLOOD RIGHT FOREARM  Final   Special Requests BOTTLES DRAWN AEROBIC AND ANAEROBIC 5CC  Final   Culture NO GROWTH 4 DAYS  Final   Report Status PENDING  Incomplete  Culture, blood (routine x 2)     Status: None (Preliminary result)   Collection Time: 10/01/14  1:03 PM  Result Value Ref Range Status   Specimen Description BLOOD LEFT ANTECUBITAL  Final   Special Requests BOTTLES DRAWN AEROBIC AND ANAEROBIC 5CC  Final   Culture NO GROWTH 4 DAYS  Final   Report Status PENDING  Incomplete  MRSA PCR Screening     Status: None   Collection Time: 10/01/14  3:40 PM  Result Value Ref Range Status   MRSA by PCR NEGATIVE NEGATIVE Final    Comment:        The GeneXpert MRSA Assay (FDA approved for NASAL specimens only), is one component of a comprehensive MRSA colonization surveillance program. It is  not intended to diagnose MRSA infection nor to guide or monitor treatment for MRSA infections.   Culture, sputum-assessment     Status: None   Collection Time: 10/01/14  4:08 PM  Result Value Ref Range Status   Specimen Description SPUTUM  Final   Special Requests NONE  Final   Sputum evaluation   Final    THIS SPECIMEN IS ACCEPTABLE. RESPIRATORY CULTURE REPORT TO FOLLOW.   Report Status 10/01/2014 FINAL  Final  Culture, respiratory (NON-Expectorated)     Status: None   Collection Time: 10/01/14  4:08 PM  Result Value Ref Range Status   Specimen Description SPUTUM  Final   Special Requests NONE  Final   Gram Stain   Final    MODERATE WBC PRESENT,BOTH PMN AND MONONUCLEAR FEW SQUAMOUS EPITHELIAL CELLS PRESENT ABUNDANT GRAM POSITIVE COCCI IN CLUSTERS IN PAIRS IN CHAINS FEW GRAM NEGATIVE RODS FEW GRAM POSITIVE RODS    Culture   Final    NORMAL OROPHARYNGEAL FLORA Performed at [facility]    Report Status 10/04/2014 FINAL  Final  Culture,  Urine     Status: None   Collection Time: 10/02/14  2:27 AM  Result Value Ref Range Status   Specimen Description URINE, CLEAN CATCH  Final   Special Requests NONE  Final   Culture   Final    MULTIPLE SPECIES PRESENT, SUGGEST RECOLLECTION IF CLINICALLY INDICATED   Report Status 10/03/2014 FINAL  Final     Studies: No results found.  Scheduled Meds: . amoxicillin-clavulanate  1 tablet Oral BID  . apixaban  2.5 mg Oral BID  . arformoterol  15 mcg Nebulization BID  . budesonide (PULMICORT) nebulizer solution  0.25 mg Nebulization BID  . conjugated estrogens  1 Applicatorful Vaginal See admin instructions  . cycloSPORINE  1 drop Both Eyes BID  . diltiazem  360 mg Oral Daily  . estradiol  0.5 mg Oral QPM  . fluticasone  1 spray Each Nare Daily  . insulin aspart  0-20 Units Subcutaneous TID WC  . irbesartan  300 mg Oral QHS  . levothyroxine  100 mcg Oral Once per day on Sun Mon Tue Thu Fri Sat  . levothyroxine  50 mcg Oral Once  per day on Wed  . loratadine  10 mg Oral Daily  . metoprolol  100 mg Oral BID  . montelukast  10 mg Oral Daily  . omega-3 acid ethyl esters  1,000 mg Oral Daily  . pantoprazole  40 mg Oral Daily  . potassium chloride  40 mEq Oral Once  . pravastatin  40 mg Oral QPM  . predniSONE  60 mg Oral BID WC  . [START ON 10/07/2014] torsemide  20 mg Oral Daily   Continuous Infusions:    Principal Problem:   Acute respiratory failure with hypoxia Active Problems:   GERD   Hypothyroid   Hyperlipidemia   PAF (paroxysmal atrial fibrillation)   Pacemaker   Chronic diastolic heart failure   Community acquired pneumonia   Hyponatremia   CAP (community acquired pneumonia)   Pneumonia   Anemia   SOB (shortness of breath)    Time spent: 40 mins    [facility] MD [facility] Pager [phone]. If 7PM-7AM, please contact night-coverage at [email or web address], password [facility] 10/06/2014, 9:45 AM  LOS: 5 days

## 2014-10-07 LAB — CBC
HEMATOCRIT: 28.7 % — AB (ref 36.0–46.0)
Hemoglobin: 9.7 g/dL — ABNORMAL LOW (ref 12.0–15.0)
MCH: 31.5 pg (ref 26.0–34.0)
MCHC: 33.8 g/dL (ref 30.0–36.0)
MCV: 93.2 fL (ref 78.0–100.0)
Platelets: 421 10*3/uL — ABNORMAL HIGH (ref 150–400)
RBC: 3.08 MIL/uL — AB (ref 3.87–5.11)
RDW: 14.9 % (ref 11.5–15.5)
WBC: 16.8 10*3/uL — ABNORMAL HIGH (ref 4.0–10.5)

## 2014-10-07 LAB — GLUCOSE, CAPILLARY
Glucose-Capillary: 141 mg/dL — ABNORMAL HIGH (ref 65–99)
Glucose-Capillary: 158 mg/dL — ABNORMAL HIGH (ref 65–99)

## 2014-10-07 LAB — BASIC METABOLIC PANEL
ANION GAP: 12 (ref 5–15)
BUN: 36 mg/dL — ABNORMAL HIGH (ref 6–20)
CO2: 23 mmol/L (ref 22–32)
Calcium: 8.7 mg/dL — ABNORMAL LOW (ref 8.9–10.3)
Chloride: 104 mmol/L (ref 101–111)
Creatinine, Ser: 1 mg/dL (ref 0.44–1.00)
GFR, EST AFRICAN AMERICAN: 58 mL/min — AB (ref >60)
GFR, EST NON AFRICAN AMERICAN: 50 mL/min — AB (ref >60)
Glucose, Bld: 155 mg/dL — ABNORMAL HIGH (ref 65–99)
POTASSIUM: 4.3 mmol/L (ref 3.5–5.1)
SODIUM: 139 mmol/L (ref 135–145)

## 2014-10-07 MED ORDER — BUDESONIDE 0.25 MG/2ML IN SUSP: 0.2500 mg | Freq: Two times a day (BID) | RESPIRATORY_TRACT | Status: DC

## 2014-10-07 MED ORDER — PREDNISONE 20 MG PO TABS: 40.0000 mg | ORAL_TABLET | Freq: Every day | ORAL | Status: DC

## 2014-10-07 MED ORDER — ARFORMOTEROL TARTRATE 15 MCG/2ML IN NEBU: 15.0000 ug | INHALATION_SOLUTION | Freq: Two times a day (BID) | RESPIRATORY_TRACT | Status: DC

## 2014-10-07 MED ORDER — AMOXICILLIN-POT CLAVULANATE 500-125 MG PO TABS: 1.0000 | ORAL_TABLET | Freq: Two times a day (BID) | ORAL | Status: DC

## 2014-10-07 NOTE — Discharge Summary (Signed)
Physician Discharge Summary  Kelsey Joseph KGM:[id number] DOB: 16-Mar-1928 DOA: 10/01/2014  PCP: Horatio Pel, MD  Admit date: 10/01/2014 Discharge date: 10/07/2014  Time spent: 65 minutes  Recommendations for Outpatient Follow-up:  1. Follow-up with Horatio Pel, MD a one-week on follow-up patient will need a basic metabolic profile done to follow-up on electrolytes and renal function. 2.   Discharge Diagnoses:  Principal Problem:   Acute respiratory failure with hypoxia Active Problems:   GERD   Hypothyroid   Hyperlipidemia   PAF (paroxysmal atrial fibrillation)   Pacemaker   Chronic diastolic heart failure   Community acquired pneumonia   Hyponatremia   CAP (community acquired pneumonia)   Pneumonia   Anemia   SOB (shortness of breath)   Discharge Condition: Stable and improved  Diet recommendation: Heart healthy  Filed Weights   10/05/14 0624 10/06/14 0535 10/07/14 0439  Weight: 65.454 kg (144 lb 4.8 oz) 65.318 kg (144 lb) 65.4 kg (144 lb 2.9 oz)    History of present illness:  Per Dr Tana Coast Patient is a 81 year old female with paroxysmal atrial fibrillation on anticoagulation, right AKA, pacemaker, hypothyroidism presented to ED with fever, cough and congestion. Patient reported that her symptoms of cough and congestion started on Wednesday, 5 days prior to admission, with wheezing. Patient had productive cough with greenish sputum, her symptoms continued to get worse. Patient went to the urgent care on the weekend and was prescribed a Zithromax however she reported that she was told not to fill it unless she was getting worse. Patient reported that she took Zithromax on the day of admission.  However she had a fever 102 on the morning of admission. She denied any chest pain, nausea and vomiting, abdominal pain. In ED, temperature 98.9, tachycardia 105, respiratory rate 21, BP 89/41 Labs reviewed, sodium 128, creatinine 0.8, lactic acid 2.95, hemoglobin 10.6,  WBCs 9.2 Chest x-ray showed bilateral perihilar pneumonia right more than left  Hospital Course:  #1 acute respiratory failure with hypoxia Patient was admitted with acute respiratory failure with hypoxia. Secondary to community acquired pneumonia with sepsis and lactic acidosis. Patient was was initially placed in the in this step down unit and placed on oxygen, scheduled nebulizers, IV antibiotics, IV steroids. Patient  i initially worsened with increased wheezing and rhonchi and a such duo nebs were changed to Pulmicort and Brovana. Patient was also placed on Flonase. Patient improved clinically during the hospitalization and was subsequently transitioned from IV antibiotics to oral Augmentin which she tolerated. IV steroids were transitioned to oral steroids and taper down. Patient was maintained on nebulizers. Patient improved clinically and be discharged home in stable and improved condition with 2 more days of oral Augmentin. On day of discharge patient was satting 98% on room air. Patient was discharged in stable and improved condition.  #2 bilateral community-acquired pneumonia Per chest x-ray on admission. Sputum Gram stain and culture were obtained. Patient was pancultured cultures did not grow anything. Patient remained afebrile patient's leukocytosis trended down during the hospitalization and felt also to be in part secondary to steroids. Patient was placed initially on IV antibiotics and initially on scheduled DuoNeb's which was subsequently changed to Pulmicort and Brovana secondary to increased wheezing and worsening the shortness of breath. Patient improved on this regimen and was subsequently transitioned to oral Augmentin. Steroids were tapered down. Patient improved significantly and be discharged home on 2 more days of oral Augmentin to complete a one-week course of antibiotic therapy. Patient will be discharged in  stable and improved condition.   #3 hyponatremia ?? Etiology. TSH at  3.077. CXR with CAP.hyponatremia improved initially with IV fluids and IV fluids were subsequently saline locked once patient was euvolemic. Hyponatremia had resolved by day of discharge.   #4 PAF s/p PPM Continued on home regimen of metoprolol and Cardizem for rate control. Continued on apixaban for anticoagulation.  #5 hypothyroidism TSH within normal limits. Continued on Synthroid.  #6 ?? Acute superimposed on chronic diastolic heart failure During the hospitalization patient did have a slight decompensation in her IV fluids were saline locked. Patient was given some IV Lasix and resumed back on her home dose torsemide. Patient had good urine output and was -2.4-5 L post IV Lasix. Patient's weight on discharge was 65.4 kg. Patient was euvolemic by day of discharge. Patient will follow-up with PCP as outpatient.   #7 hyperlipidemia Continued on Pravachol.  #8 leg injury status post multiple surgeries a right AKA patient with phantom leg pain. Continued on home regimen of diazepam and Flexeril. Outpatient follow-up.  #9 hypertension Continued on metoprolol, cardizem and ARB. Monitor.   #10 anemia of chronic disease No overt bleeding. Follow H&H.   #11 Elevated Creatinine Improved with gentle hydration. Patient's diuretics were held and resumed on day of discharge. Outpatient follow-up.    Procedures:  Chest x-ray 10/01/2014, 10/03/14    Consultations:  None  Discharge Exam: Filed Vitals:   10/07/14 0439  BP: 152/61  Pulse: 84  Temp: 98.4 F (36.9 C)  Resp: 18    General: NAD Cardiovascular: RRR Respiratory: CTAB  Discharge Instructions   Discharge Instructions    Diet - low sodium heart healthy    Complete by:  As directed      Discharge instructions    Complete by:  As directed   Follow up with Horatio Pel, MD in 1 week/as scheduled.     Increase activity slowly    Complete by:  As directed           Current Discharge Medication List     START taking these medications   Details  amoxicillin-clavulanate (AUGMENTIN) 500-125 MG per tablet Take 1 tablet (500 mg total) by mouth 2 (two) times daily. Take for 2 days, then stop. Qty: 4 tablet, Refills: 0    arformoterol (BROVANA) 15 MCG/2ML NEBU Take 2 mLs (15 mcg total) by nebulization 2 (two) times daily. Qty: 120 mL, Refills: 0    budesonide (PULMICORT) 0.25 MG/2ML nebulizer solution Take 2 mLs (0.25 mg total) by nebulization 2 (two) times daily. Qty: 60 mL, Refills: 0    predniSONE (DELTASONE) 20 MG tablet Take 2 tablets (40 mg total) by mouth daily with breakfast. Take 2 tablets (40mg ) daily x 2 days then 1 tablet (20mg ) daily x 2 days then stop. Qty: 6 tablet, Refills: 0      CONTINUE these medications which have NOT CHANGED   Details  apixaban (ELIQUIS) 2.5 MG TABS tablet Take 1 tablet (2.5 mg total) by mouth 2 (two) times daily. Qty: 60 tablet, Refills: 1    brompheniramine-pseudoephedrine-DM 30-2-10 MG/5ML syrup TAKE 10 MLS BY MOUTH EVERY 4 TO 6 HOURS DAILY AS NEEDED FOR COUGH Refills: 0    Calcium Carbonate-Vitamin D (CALCIUM + D PO) Take 1 tablet by mouth every evening.    cetirizine (ZYRTEC) 10 MG tablet TAKE 5 MG BY MOUTH DAILY AT BEDTIME Refills: 0    conjugated estrogens (PREMARIN) vaginal cream Place 1 Applicatorful vaginally See admin instructions. 1-2 times per week  at bedtime    cycloSPORINE (RESTASIS) 0.05 % ophthalmic emulsion Place 1 drop into both eyes 2 (two) times daily.    diazepam (VALIUM) 5 MG tablet Take 5 mg by mouth every 6 (six) hours as needed for muscle spasms.     diltiazem (TIAZAC) 360 MG 24 hr capsule Take 1 capsule (360 mg total) by mouth daily. Qty: 90 capsule, Refills: 3    estradiol (ESTRACE) 0.5 MG tablet Take 0.5 mg by mouth every evening.    fluticasone (FLONASE) 50 MCG/ACT nasal spray INSERT 1 SPRAY INTO EACH NARE DAILY AS NEEDED FOR CONGESTION/ALLERGIES Refills: 0    Guaifenesin (MUCINEX MAXIMUM STRENGTH) 1200 MG TB12  Take 1,200 mg by mouth daily as needed (cough).    HYDROcodone-acetaminophen (NORCO/VICODIN) 5-325 MG per tablet Take 1-2 tablets by mouth every 6 (six) hours as needed for moderate pain or severe pain.     irbesartan (AVAPRO) 300 MG tablet Take 1 tablet (300 mg total) by mouth at bedtime. Qty: 90 tablet, Refills: 3    levothyroxine (SYNTHROID, LEVOTHROID) 100 MCG tablet Take 50-100 mcg by mouth daily before breakfast. Take 40mcg on Wednesday and 184mcg all other days    metoprolol (LOPRESSOR) 100 MG tablet TAKE 1 TABLET TWICE A DAY Qty: 180 tablet, Refills: 0    montelukast (SINGULAIR) 10 MG tablet Take 10 mg by mouth daily.    Multiple Vitamins-Minerals (HAIR/SKIN/NAILS PO) Take 1-2 tablets by mouth 2 (two) times daily. 2 BY MOUTH IN THE AM, 1 BY MOUTH IN THE PM    Omega-3 Fatty Acids (FISH OIL) 1000 MG CAPS Take 1,000 mg by mouth daily.     omeprazole (PRILOSEC) 40 MG capsule Take 40 mg by mouth daily.    pravastatin (PRAVACHOL) 40 MG tablet Take 1 tablet (40 mg total) by mouth every evening. Qty: 90 tablet, Refills: 3    torsemide (DEMADEX) 20 MG tablet Take 20 mg by mouth daily.    traMADol (ULTRAM) 50 MG tablet Take 50 mg by mouth every 6 (six) hours as needed for moderate pain.      STOP taking these medications     cyclobenzaprine (FLEXERIL) 5 MG tablet      meloxicam (MOBIC) 15 MG tablet        Allergies  Allergen Reactions  . Latex     Patient states only "latex bandages" causes blisters  . Adhesive [Tape] Other (See Comments)    Blisters  . Sulfa Antibiotics Other (See Comments)    Severe diarrhea   Follow-up Information    Follow up with Horatio Pel, MD. Schedule an appointment as soon as possible for a visit in 1 week.   Specialty:  Internal Medicine   Why:  f/u 1-2 weeks   Contact information:   [address] [place] [address] [phone]        The results of significant diagnostics from this hospitalization  (including imaging, microbiology, ancillary and laboratory) are listed below for reference.    Significant Diagnostic Studies: Dg Chest 2 View  10/01/2014   CLINICAL DATA:  Cough.  Shortness of breath.  EXAM: CHEST  2 VIEW  COMPARISON:  03/21/2014.  FINDINGS: Dual lead pacemaker remains in place. Heart size is normal. There is atherosclerosis of the aorta. There is perihilar opacity bilaterally more extensive on the right than the left most consistent with bronchopneumonia. Region of most pronounced involvement is the superior segment of the right lower lobe. Acute presentation of interstitial and alveolar edema is possible  but felt less likely. No effusions. Bony structures unremarkable.  IMPRESSION: Bilateral perihilar pneumonia, right more than left.   Electronically Signed   By: Nelson Chimes M.D.   On: 10/01/2014 12:40   Dg Chest Port 1 View  10/03/2014   CLINICAL DATA:  Shortness of breath.  EXAM: PORTABLE CHEST - 1 VIEW  COMPARISON:  10/01/2014  FINDINGS: Cardiomediastinal silhouette is unchanged. Heart size is within normal limits. Left-sided dual lead pacemaker remains in place. Right greater than left perihilar opacities on the prior study have increased, now with involvement of both lung bases. A right pleural effusion is not excluded. No pneumothorax is seen.  IMPRESSION: Worsening bilateral airspace opacities, pneumonia versus edema.   Electronically Signed   By: Logan Bores   On: 10/03/2014 09:26    Microbiology: Recent Results (from the past 240 hour(s))  Culture, blood (routine x 2)     Status: None   Collection Time: 10/01/14 12:50 PM  Result Value Ref Range Status   Specimen Description BLOOD RIGHT FOREARM  Final   Special Requests BOTTLES DRAWN AEROBIC AND ANAEROBIC 5CC  Final   Culture NO GROWTH 5 DAYS  Final   Report Status 10/06/2014 FINAL  Final  Culture, blood (routine x 2)     Status: None   Collection Time: 10/01/14  1:03 PM  Result Value Ref Range Status   Specimen  Description BLOOD LEFT ANTECUBITAL  Final   Special Requests BOTTLES DRAWN AEROBIC AND ANAEROBIC 5CC  Final   Culture NO GROWTH 5 DAYS  Final   Report Status 10/06/2014 FINAL  Final  MRSA PCR Screening     Status: None   Collection Time: 10/01/14  3:40 PM  Result Value Ref Range Status   MRSA by PCR NEGATIVE NEGATIVE Final    Comment:        The GeneXpert MRSA Assay (FDA approved for NASAL specimens only), is one component of a comprehensive MRSA colonization surveillance program. It is not intended to diagnose MRSA infection nor to guide or monitor treatment for MRSA infections.   Culture, sputum-assessment     Status: None   Collection Time: 10/01/14  4:08 PM  Result Value Ref Range Status   Specimen Description SPUTUM  Final   Special Requests NONE  Final   Sputum evaluation   Final    THIS SPECIMEN IS ACCEPTABLE. RESPIRATORY CULTURE REPORT TO FOLLOW.   Report Status 10/01/2014 FINAL  Final  Culture, respiratory (NON-Expectorated)     Status: None   Collection Time: 10/01/14  4:08 PM  Result Value Ref Range Status   Specimen Description SPUTUM  Final   Special Requests NONE  Final   Gram Stain   Final    MODERATE WBC PRESENT,BOTH PMN AND MONONUCLEAR FEW SQUAMOUS EPITHELIAL CELLS PRESENT ABUNDANT GRAM POSITIVE COCCI IN CLUSTERS IN PAIRS IN CHAINS FEW GRAM NEGATIVE RODS FEW GRAM POSITIVE RODS    Culture   Final    NORMAL OROPHARYNGEAL FLORA Performed at [facility]    Report Status 10/04/2014 FINAL  Final  Culture, Urine     Status: None   Collection Time: 10/02/14  2:27 AM  Result Value Ref Range Status   Specimen Description URINE, CLEAN CATCH  Final   Special Requests NONE  Final   Culture   Final    MULTIPLE SPECIES PRESENT, SUGGEST RECOLLECTION IF CLINICALLY INDICATED   Report Status 10/03/2014 FINAL  Final     Labs: Basic Metabolic Panel:  Recent Labs Lab 10/03/14  [id number] 10/04/14 0300 10/05/14 0423 10/06/14 0300 10/07/14 0450  NA 128* 134*  135 137 139  K 4.6 3.7 3.7 3.5 4.3  CL 96* 98* 101 101 104  CO2 19* 23 23 24 23   GLUCOSE 306* 163* 197* 230* 155*  BUN 25* 31* 46* 50* 36*  CREATININE 0.99 1.11* 1.42* 1.21* 1.00  CALCIUM 8.3* 8.5* 8.5* 8.2* 8.7*  PHOS 2.7  --   --   --   --    Liver Function Tests:  Recent Labs Lab 10/01/14 1154 10/03/14 0520  AST 44*  --   ALT 38  --   ALKPHOS 99  --   BILITOT 0.7  --   PROT 6.3*  --   ALBUMIN 2.8* 2.5*   No results for input(s): LIPASE, AMYLASE in the last 168 hours. No results for input(s): AMMONIA in the last 168 hours. CBC:  Recent Labs Lab 10/01/14 1154  10/03/14 0520 10/04/14 0300 10/05/14 0423 10/06/14 0300 10/07/14 0450  WBC 9.2  < > 12.9* 14.4* 18.2* 17.2* 16.8*  NEUTROABS 7.4  --  11.6*  --  15.5*  --   --   HGB 10.6*  < > 9.6* 9.9* 9.9* 9.6* 9.7*  HCT 31.3*  < > 27.9* 28.7* 29.0* 27.7* 28.7*  MCV 93.7  < > 93.6 92.3 91.8 91.4 93.2  PLT 190  < > 225 299 382 396 421*  < > = values in this interval not displayed. Cardiac Enzymes: No results for input(s): CKTOTAL, CKMB, CKMBINDEX, TROPONINI in the last 168 hours. BNP: BNP (last 3 results) No results for input(s): BNP in the last 8760 hours.  ProBNP (last 3 results) No results for input(s): PROBNP in the last 8760 hours.  CBG:  Recent Labs Lab 10/06/14 0617 10/06/14 1116 10/06/14 1617 10/06/14 2107 10/07/14 0615  GLUCAP 220* 184* 229* 211* 141*       Signed:  THOMPSON,DANIEL MD [facility] 10/07/2014, 11:35 AM

## 2014-10-09 ENCOUNTER — Telehealth: Payer: Self-pay

## 2014-10-09 NOTE — Telephone Encounter (Signed)
Pt is on TCM for adm for pneumonia. Pt to follow up with Dr. Shelia Media.

## 2014-10-17 DIAGNOSIS — Z23 Encounter for immunization: Secondary | ICD-10-CM | POA: Diagnosis not present

## 2014-10-17 DIAGNOSIS — E871 Hypo-osmolality and hyponatremia: Secondary | ICD-10-CM | POA: Diagnosis not present

## 2014-10-17 DIAGNOSIS — J189 Pneumonia, unspecified organism: Secondary | ICD-10-CM | POA: Diagnosis not present

## 2014-10-17 DIAGNOSIS — D539 Nutritional anemia, unspecified: Secondary | ICD-10-CM | POA: Diagnosis not present

## 2014-10-30 DIAGNOSIS — M7021 Olecranon bursitis, right elbow: Secondary | ICD-10-CM | POA: Diagnosis not present

## 2014-10-30 DIAGNOSIS — M25551 Pain in right hip: Secondary | ICD-10-CM | POA: Diagnosis not present

## 2014-10-31 ENCOUNTER — Other Ambulatory Visit: Payer: Self-pay | Admitting: Orthopedic Surgery

## 2014-10-31 DIAGNOSIS — M545 Low back pain: Principal | ICD-10-CM

## 2014-10-31 DIAGNOSIS — G8929 Other chronic pain: Secondary | ICD-10-CM

## 2014-11-16 ENCOUNTER — Encounter: Payer: Self-pay | Admitting: Cardiovascular Disease

## 2014-11-16 ENCOUNTER — Ambulatory Visit (INDEPENDENT_AMBULATORY_CARE_PROVIDER_SITE_OTHER): Payer: Medicare Other | Admitting: Cardiovascular Disease

## 2014-11-16 VITALS — BP 164/79 | HR 70 | Resp 16 | Ht 63.0 in | Wt 130.0 lb

## 2014-11-16 DIAGNOSIS — I471 Supraventricular tachycardia: Secondary | ICD-10-CM

## 2014-11-16 DIAGNOSIS — I5032 Chronic diastolic (congestive) heart failure: Secondary | ICD-10-CM | POA: Diagnosis not present

## 2014-11-16 DIAGNOSIS — I10 Essential (primary) hypertension: Secondary | ICD-10-CM

## 2014-11-16 DIAGNOSIS — I4891 Unspecified atrial fibrillation: Secondary | ICD-10-CM

## 2014-11-16 DIAGNOSIS — I4719 Other supraventricular tachycardia: Secondary | ICD-10-CM

## 2014-11-16 DIAGNOSIS — R079 Chest pain, unspecified: Secondary | ICD-10-CM | POA: Diagnosis not present

## 2014-11-16 DIAGNOSIS — I455 Other specified heart block: Secondary | ICD-10-CM

## 2014-11-16 DIAGNOSIS — I480 Paroxysmal atrial fibrillation: Secondary | ICD-10-CM

## 2014-11-16 NOTE — Patient Instructions (Signed)
Your physician has requested that you have a lexiscan myoview. For further information please visit [email or web address]. Please follow instruction sheet, as given.  Remote monitoring is used to monitor your Pacemaker from home. This monitoring reduces the number of office visits required to check your device to one time per year. It allows [place] to monitor the functioning of your device to ensure it is working properly. You are scheduled for a device check from home on February 17, 2015. You may send your transmission at any time that day. If you have a wireless device, the transmission will be sent automatically. After your physician reviews your transmission, you will receive a postcard with your next transmission date.  Dr. Sallyanne Kuster recommends that you schedule a follow-up appointment in: 6 months with pacemaker check (Medtronic - Blue).
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
## 2014-11-19 ENCOUNTER — Telehealth: Payer: Self-pay | Admitting: *Deleted

## 2014-11-19 DIAGNOSIS — E785 Hyperlipidemia, unspecified: Secondary | ICD-10-CM

## 2014-11-19 NOTE — Telephone Encounter (Signed)
Order placed for Lipid Profile and patient notified to have fasting lab before she comes to the office 12/04/14 for this blood draw.

## 2014-11-19 NOTE — Telephone Encounter (Signed)
-----   Message from Sanda Klein, MD sent at 11/17/2014  5:39 PM EDT -----  She is coming in on August 30, a Tuesday at 9:15 to have a [employer]. On the same day at would like her to have a lipid profile. She will be npo anyway.  Also I would like Kelsey Joseph to turn on her atrial therapies, please.  Thanks. MCr

## 2014-11-20 DIAGNOSIS — D539 Nutritional anemia, unspecified: Secondary | ICD-10-CM | POA: Diagnosis not present

## 2014-11-20 DIAGNOSIS — E780 Pure hypercholesterolemia: Secondary | ICD-10-CM | POA: Diagnosis not present

## 2014-11-20 DIAGNOSIS — R7309 Other abnormal glucose: Secondary | ICD-10-CM | POA: Diagnosis not present

## 2014-11-20 DIAGNOSIS — G546 Phantom limb syndrome with pain: Secondary | ICD-10-CM | POA: Diagnosis not present

## 2014-11-20 NOTE — Telephone Encounter (Signed)
Martinez to let me know she received yesterdays message.  Info forwarded to [facility] yesterday.

## 2014-11-23 ENCOUNTER — Telehealth: Payer: Self-pay | Admitting: Cardiovascular Disease

## 2014-11-23 LAB — CUP PACEART INCLINIC DEVICE CHECK
Battery Remaining Longevity: 115 mo
Battery Voltage: 3.03 V
Brady Statistic AP VP Percent: 1.05 %
Brady Statistic AS VS Percent: 21.5 %
Brady Statistic RA Percent Paced: 50.32 %
Lead Channel Impedance Value: 475 Ohm
Lead Channel Pacing Threshold Amplitude: 0.875 V
Lead Channel Pacing Threshold Pulse Width: 0.4 ms
Lead Channel Pacing Threshold Pulse Width: 0.4 ms
Lead Channel Sensing Intrinsic Amplitude: 2.75 mV
Lead Channel Sensing Intrinsic Amplitude: 2.75 mV
Lead Channel Setting Pacing Amplitude: 1.75 V
Lead Channel Setting Pacing Amplitude: 2 V
Lead Channel Setting Sensing Sensitivity: 2 mV
MDC IDC MSMT LEADCHNL RA IMPEDANCE VALUE: 589 Ohm
MDC IDC MSMT LEADCHNL RV IMPEDANCE VALUE: 418 Ohm
MDC IDC MSMT LEADCHNL RV IMPEDANCE VALUE: 494 Ohm
MDC IDC MSMT LEADCHNL RV PACING THRESHOLD AMPLITUDE: 0.75 V
MDC IDC MSMT LEADCHNL RV SENSING INTR AMPL: 15.125 mV
MDC IDC MSMT LEADCHNL RV SENSING INTR AMPL: 15.125 mV
MDC IDC SESS DTM: 20160812121254
MDC IDC SET LEADCHNL RV PACING PULSEWIDTH: 0.4 ms
MDC IDC SET ZONE DETECTION INTERVAL: 400 ms
MDC IDC STAT BRADY AP VS PERCENT: 49.27 %
MDC IDC STAT BRADY AS VP PERCENT: 28.19 %
MDC IDC STAT BRADY RV PERCENT PACED: 29.23 %
Zone Setting Detection Interval: 400 ms

## 2014-11-23 NOTE — Telephone Encounter (Signed)
Pt called in wanting to let you know that she received the message about her coming in early to have some blood test done.   Thanks   This can be closed after its read

## 2014-11-29 ENCOUNTER — Telehealth (HOSPITAL_COMMUNITY): Payer: Self-pay

## 2014-11-29 NOTE — Telephone Encounter (Signed)
Encounter complete. 

## 2014-11-30 ENCOUNTER — Encounter: Payer: Self-pay | Admitting: Cardiovascular Disease

## 2014-11-30 ENCOUNTER — Telehealth: Payer: Self-pay | Admitting: *Deleted

## 2014-11-30 ENCOUNTER — Telehealth (HOSPITAL_COMMUNITY): Payer: Self-pay

## 2014-11-30 NOTE — Telephone Encounter (Signed)
Requesting surgical clearance:   1. Type of surgery: Lumbar Myelogram  2. Surgeon: Dr. Frederik [facility]  3. Surgical date: will be scheduled after response  4. Medications that need to be held: Eliquis x 48 hours                         Special instructions:

## 2014-11-30 NOTE — Telephone Encounter (Signed)
Letter in Epic, please make sure they receive it

## 2014-11-30 NOTE — Telephone Encounter (Signed)
Encounter complete. 

## 2014-12-03 ENCOUNTER — Telehealth: Payer: Self-pay | Admitting: Cardiovascular Disease

## 2014-12-03 NOTE — Telephone Encounter (Signed)
Returned call to patient.She stated she needs a myelogram and [facility] wants to ask Dr.Croitoru if she needs to hold eliquis.Dr.Croitoru out of office today will send message to him for advice.

## 2014-12-03 NOTE — Telephone Encounter (Signed)
Patient needs to be scheduled for a mylogram---does she need to hold her Eliquis?

## 2014-12-04 ENCOUNTER — Ambulatory Visit (HOSPITAL_COMMUNITY)
Admission: RE | Admit: 2014-12-04 | Discharge: 2014-12-04 | Disposition: A | Discharge status: Home / Self Care | Payer: Medicare Other | Source: Ambulatory Visit | Attending: Cardiovascular Disease | Admitting: Cardiovascular Disease

## 2014-12-04 DIAGNOSIS — Z8249 Family history of ischemic heart disease and other diseases of the circulatory system: Secondary | ICD-10-CM | POA: Diagnosis not present

## 2014-12-04 DIAGNOSIS — I10 Essential (primary) hypertension: Secondary | ICD-10-CM | POA: Diagnosis not present

## 2014-12-04 DIAGNOSIS — R079 Chest pain, unspecified: Secondary | ICD-10-CM | POA: Diagnosis not present

## 2014-12-04 DIAGNOSIS — Z950 Presence of cardiac pacemaker: Secondary | ICD-10-CM | POA: Insufficient documentation

## 2014-12-04 LAB — MYOCARDIAL PERFUSION IMAGING
CHL CUP NUCLEAR SSS: 2
CHL CUP RESTING HR STRESS: 61 {beats}/min
CSEPPHR: 67 {beats}/min
LV dias vol: 67 mL
LVSYSVOL: 18 mL
SDS: 0
SRS: 2
TID: 1.47

## 2014-12-04 MED ORDER — REGADENOSON 0.4 MG/5ML IV SOLN
0.4000 mg | Freq: Once | INTRAVENOUS | Status: AC
  Administered 2014-12-04: 0.4 mg via INTRAVENOUS

## 2014-12-04 MED ORDER — AMINOPHYLLINE 25 MG/ML IV SOLN
75.0000 mg | Freq: Once | INTRAVENOUS | Status: AC
  Administered 2014-12-04: 75 mg via INTRAVENOUS

## 2014-12-04 MED ORDER — TECHNETIUM TC 99M SESTAMIBI GENERIC - CARDIOLITE
29.1000 | Freq: Once | INTRAVENOUS | Status: AC | PRN
  Administered 2014-12-04: 29.1 via INTRAVENOUS

## 2014-12-04 MED ORDER — TECHNETIUM TC 99M SESTAMIBI GENERIC - CARDIOLITE
10.8000 | Freq: Once | INTRAVENOUS | Status: AC | PRN
  Administered 2014-12-04: 11 via INTRAVENOUS

## 2014-12-04 NOTE — Telephone Encounter (Signed)
Spoke to patient. Patient states she had procedure done today,already.

## 2014-12-04 NOTE — Telephone Encounter (Signed)
Yes, she will need to hold Eliquis for at least 48 hours before the procedure.

## 2014-12-06 ENCOUNTER — Encounter: Payer: Self-pay | Admitting: Cardiovascular Disease

## 2014-12-07 ENCOUNTER — Ambulatory Visit
Admission: RE | Admit: 2014-12-07 | Discharge: 2014-12-07 | Disposition: A | Discharge status: Home / Self Care | Payer: Medicare Other | Source: Ambulatory Visit | Attending: Orthopedic Surgery | Admitting: Orthopedic Surgery

## 2014-12-07 DIAGNOSIS — G8929 Other chronic pain: Secondary | ICD-10-CM

## 2014-12-07 DIAGNOSIS — M5127 Other intervertebral disc displacement, lumbosacral region: Secondary | ICD-10-CM | POA: Diagnosis not present

## 2014-12-07 DIAGNOSIS — M545 Low back pain, unspecified: Secondary | ICD-10-CM

## 2014-12-07 DIAGNOSIS — M4807 Spinal stenosis, lumbosacral region: Secondary | ICD-10-CM | POA: Diagnosis not present

## 2014-12-07 MED ORDER — DIAZEPAM 5 MG PO TABS
5.0000 mg | ORAL_TABLET | Freq: Once | ORAL | Status: AC
  Administered 2014-12-07: 5 mg via ORAL

## 2014-12-07 MED ORDER — IOHEXOL 180 MG/ML  SOLN
15.0000 mL | Freq: Once | INTRAMUSCULAR | Status: DC | PRN
  Administered 2014-12-07: 15 mL via INTRATHECAL

## 2014-12-07 NOTE — Discharge Instructions (Signed)
Myelogram Discharge Instructions  1. Go home and rest quietly for the next 24 hours.  It is important to lie flat for the next 24 hours.  Get up only to go to the restroom.  You may lie in the bed or on a couch on your back, your stomach, your left side or your right side.  You may have one pillow under your head.  You may have pillows between your knees while you are on your side or under your knees while you are on your back.  2. DO NOT drive today.  Recline the seat as far back as it will go, while still wearing your seat belt, on the way home.  3. You may get up to go to the bathroom as needed.  You may sit up for 10 minutes to eat.  You may resume your normal diet and medications unless otherwise indicated.  Drink lots of extra fluids today and tomorrow.  4. The incidence of headache, nausea, or vomiting is about 5% (one in 20 patients).  If you develop a headache, lie flat and drink plenty of fluids until the headache goes away.  Caffeinated beverages may be helpful.  If you develop severe nausea and vomiting or a headache that does not go away with flat bed rest, call [phone].  5. You may resume normal activities after your 24 hours of bed rest is over; however, do not exert yourself strongly or do any heavy lifting tomorrow. If when you get up you have a headache when standing, go back to bed and force fluids for another 24 hours.  6. Call your physician for a follow-up appointment.  The results of your myelogram will be sent directly to your physician by the following day.  7. If you have any questions or if complications develop after you arrive home, please call [phone].  Discharge instructions have been explained to the patient.  The patient, or the person responsible for the patient, fully understands these instructions.      MAY RESUME ELAQUIS ON TODAY.

## 2014-12-14 DIAGNOSIS — M4806 Spinal stenosis, lumbar region: Secondary | ICD-10-CM | POA: Diagnosis not present

## 2014-12-17 ENCOUNTER — Encounter: Payer: Self-pay | Admitting: Cardiovascular Disease

## 2014-12-24 DIAGNOSIS — M4806 Spinal stenosis, lumbar region: Secondary | ICD-10-CM | POA: Diagnosis not present

## 2014-12-27 DIAGNOSIS — L03116 Cellulitis of left lower limb: Secondary | ICD-10-CM | POA: Diagnosis not present

## 2014-12-27 DIAGNOSIS — R7309 Other abnormal glucose: Secondary | ICD-10-CM | POA: Diagnosis not present

## 2014-12-27 DIAGNOSIS — I10 Essential (primary) hypertension: Secondary | ICD-10-CM | POA: Diagnosis not present

## 2014-12-27 DIAGNOSIS — M7989 Other specified soft tissue disorders: Secondary | ICD-10-CM | POA: Diagnosis not present

## 2014-12-31 ENCOUNTER — Other Ambulatory Visit: Payer: Self-pay | Admitting: Cardiovascular Disease

## 2015-01-01 DIAGNOSIS — M7989 Other specified soft tissue disorders: Secondary | ICD-10-CM | POA: Diagnosis not present

## 2015-01-01 DIAGNOSIS — Z23 Encounter for immunization: Secondary | ICD-10-CM | POA: Diagnosis not present

## 2015-01-01 DIAGNOSIS — L03116 Cellulitis of left lower limb: Secondary | ICD-10-CM | POA: Diagnosis not present

## 2015-01-17 DIAGNOSIS — M4806 Spinal stenosis, lumbar region: Secondary | ICD-10-CM | POA: Diagnosis not present

## 2015-02-06 DIAGNOSIS — Z23 Encounter for immunization: Secondary | ICD-10-CM | POA: Diagnosis not present

## 2015-02-18 ENCOUNTER — Ambulatory Visit (INDEPENDENT_AMBULATORY_CARE_PROVIDER_SITE_OTHER): Payer: Medicare Other | Admitting: *Deleted

## 2015-02-18 DIAGNOSIS — I4891 Unspecified atrial fibrillation: Secondary | ICD-10-CM

## 2015-02-18 NOTE — Progress Notes (Signed)
Remote pacemaker transmission.   

## 2015-02-27 LAB — CUP PACEART REMOTE DEVICE CHECK
Battery Remaining Longevity: 112 mo
Battery Voltage: 3.02 V
Brady Statistic AP VS Percent: 98.13 %
Brady Statistic RA Percent Paced: 99.44 %
Brady Statistic RV Percent Paced: 1.32 %
Date Time Interrogation Session: 20161114145722
Implantable Lead Implant Date: 20151215
Implantable Lead Location: 753860
Implantable Lead Model: 5076
Implantable Lead Model: 5076
Lead Channel Impedance Value: 475 Ohm
Lead Channel Pacing Threshold Amplitude: 0.75 V
Lead Channel Pacing Threshold Amplitude: 0.875 V
Lead Channel Pacing Threshold Pulse Width: 0.4 ms
Lead Channel Sensing Intrinsic Amplitude: 1.875 mV
Lead Channel Sensing Intrinsic Amplitude: 1.875 mV
Lead Channel Setting Pacing Amplitude: 1.5 V
Lead Channel Setting Sensing Sensitivity: 2 mV
MDC IDC LEAD IMPLANT DT: 20151215
MDC IDC LEAD LOCATION: 753859
MDC IDC MSMT LEADCHNL RA IMPEDANCE VALUE: 494 Ohm
MDC IDC MSMT LEADCHNL RA IMPEDANCE VALUE: 608 Ohm
MDC IDC MSMT LEADCHNL RV IMPEDANCE VALUE: 418 Ohm
MDC IDC MSMT LEADCHNL RV PACING THRESHOLD PULSEWIDTH: 0.4 ms
MDC IDC MSMT LEADCHNL RV SENSING INTR AMPL: 16.25 mV
MDC IDC MSMT LEADCHNL RV SENSING INTR AMPL: 16.25 mV
MDC IDC SET LEADCHNL RV PACING AMPLITUDE: 2 V
MDC IDC SET LEADCHNL RV PACING PULSEWIDTH: 0.4 ms
MDC IDC STAT BRADY AP VP PERCENT: 1.31 %
MDC IDC STAT BRADY AS VP PERCENT: 0 %
MDC IDC STAT BRADY AS VS PERCENT: 0.55 %

## 2015-03-06 ENCOUNTER — Encounter: Payer: Self-pay | Admitting: Cardiology

## 2015-03-07 ENCOUNTER — Encounter: Payer: Self-pay | Admitting: Cardiovascular Disease

## 2015-03-14 DIAGNOSIS — M4806 Spinal stenosis, lumbar region: Secondary | ICD-10-CM | POA: Diagnosis not present

## 2015-03-20 ENCOUNTER — Encounter: Payer: Self-pay | Admitting: Cardiology

## 2015-03-21 DIAGNOSIS — H26493 Other secondary cataract, bilateral: Secondary | ICD-10-CM | POA: Diagnosis not present

## 2015-03-21 DIAGNOSIS — H02831 Dermatochalasis of right upper eyelid: Secondary | ICD-10-CM | POA: Diagnosis not present

## 2015-03-21 DIAGNOSIS — H02834 Dermatochalasis of left upper eyelid: Secondary | ICD-10-CM | POA: Diagnosis not present

## 2015-03-21 DIAGNOSIS — G43909 Migraine, unspecified, not intractable, without status migrainosus: Secondary | ICD-10-CM | POA: Diagnosis not present

## 2015-03-21 DIAGNOSIS — Z961 Presence of intraocular lens: Secondary | ICD-10-CM | POA: Diagnosis not present

## 2015-03-21 DIAGNOSIS — H04123 Dry eye syndrome of bilateral lacrimal glands: Secondary | ICD-10-CM | POA: Diagnosis not present

## 2015-03-21 DIAGNOSIS — H40013 Open angle with borderline findings, low risk, bilateral: Secondary | ICD-10-CM | POA: Diagnosis not present

## 2015-04-05 DIAGNOSIS — R208 Other disturbances of skin sensation: Secondary | ICD-10-CM | POA: Diagnosis not present

## 2015-04-05 DIAGNOSIS — B001 Herpesviral vesicular dermatitis: Secondary | ICD-10-CM | POA: Diagnosis not present

## 2015-04-05 DIAGNOSIS — R600 Localized edema: Secondary | ICD-10-CM | POA: Diagnosis not present

## 2015-04-09 DIAGNOSIS — M533 Sacrococcygeal disorders, not elsewhere classified: Secondary | ICD-10-CM | POA: Diagnosis not present

## 2015-04-10 ENCOUNTER — Ambulatory Visit (INDEPENDENT_AMBULATORY_CARE_PROVIDER_SITE_OTHER): Payer: Medicare Other | Admitting: Neurology

## 2015-04-10 ENCOUNTER — Other Ambulatory Visit: Payer: Self-pay | Admitting: Cardiovascular Disease

## 2015-04-10 ENCOUNTER — Encounter: Payer: Self-pay | Admitting: Neurology

## 2015-04-10 VITALS — BP 108/64 | HR 63 | Ht 63.0 in | Wt 137.0 lb

## 2015-04-10 DIAGNOSIS — M533 Sacrococcygeal disorders, not elsewhere classified: Secondary | ICD-10-CM | POA: Diagnosis not present

## 2015-04-10 DIAGNOSIS — M4806 Spinal stenosis, lumbar region: Secondary | ICD-10-CM | POA: Diagnosis not present

## 2015-04-10 DIAGNOSIS — M48061 Spinal stenosis, lumbar region without neurogenic claudication: Secondary | ICD-10-CM | POA: Insufficient documentation

## 2015-04-10 MED ORDER — GABAPENTIN 300 MG PO CAPS: 300.0000 mg | ORAL_CAPSULE | Freq: Three times a day (TID) | ORAL | Status: DC

## 2015-04-10 NOTE — Telephone Encounter (Signed)
Rx(s) sent to pharmacy electronically.  
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
## 2015-04-24 DIAGNOSIS — Z6822 Body mass index (BMI) 22.0-22.9, adult: Secondary | ICD-10-CM | POA: Diagnosis not present

## 2015-04-24 DIAGNOSIS — Z01419 Encounter for gynecological examination (general) (routine) without abnormal findings: Secondary | ICD-10-CM | POA: Diagnosis not present

## 2015-04-24 DIAGNOSIS — Z1231 Encounter for screening mammogram for malignant neoplasm of breast: Secondary | ICD-10-CM | POA: Diagnosis not present

## 2015-04-29 DIAGNOSIS — M4806 Spinal stenosis, lumbar region: Secondary | ICD-10-CM | POA: Diagnosis not present

## 2015-04-29 DIAGNOSIS — M5416 Radiculopathy, lumbar region: Secondary | ICD-10-CM | POA: Diagnosis not present

## 2015-05-21 ENCOUNTER — Ambulatory Visit (INDEPENDENT_AMBULATORY_CARE_PROVIDER_SITE_OTHER): Payer: Self-pay | Admitting: Neurology

## 2015-05-21 ENCOUNTER — Ambulatory Visit (INDEPENDENT_AMBULATORY_CARE_PROVIDER_SITE_OTHER): Payer: Medicare Other | Admitting: Neurology

## 2015-05-21 DIAGNOSIS — M48061 Spinal stenosis, lumbar region without neurogenic claudication: Secondary | ICD-10-CM

## 2015-05-21 DIAGNOSIS — M4806 Spinal stenosis, lumbar region: Secondary | ICD-10-CM

## 2015-05-21 DIAGNOSIS — Z0289 Encounter for other administrative examinations: Secondary | ICD-10-CM

## 2015-05-21 NOTE — Procedures (Signed)
   NCS (NERVE CONDUCTION STUDY) WITH EMG (ELECTROMYOGRAPHY) REPORT   STUDY DATE: May 21 2015 PATIENT NAME: Kelsey Joseph DOB: [birth year] MRN: [id number]    TECHNOLOGIST: Laretta Alstrom ELECTROMYOGRAPHER: Marcial Pacas M.D.  CLINICAL INFORMATION:  82 year old female, with history of right hip amputation, multiple left knee surgery, scoliosis, presenting with few months history of left low back pain, radiating pain to left hip, radiating to left leg, left lateral leg paresthesia,  FINDINGS: NERVE CONDUCTION STUDY: Left peroneal, sural sensory responses were absent.  Left peroneal to EDB motor response showed moderate to severely decreased C map amplitude, with preserved distal latency, conduction velocity.  Left tibial motor responses were normal.  Left tibial H reflexes were absent.  NEEDLE ELECTROMYOGRAPHY: Selected needle examination was performed at left lower extremity muscles, left lumbar sacral paraspinal muscles.  Left tibialis anterior, tibialis posterior, peroneal longus, medial gastrocnemius, vastus lateralis, biceps femoris long head: Normal insertion activity, no spontaneous activity, mildly enlarged motor unit potential, with mildly decreased recruitment patterns.  Needle examination of left gluteus medius was normal  There was increased insertional activity at left L3, 1 plus positive waves at L4, no spontaneous activity at left L5-S1.  IMPRESSION:   This is an abnormal study. There is electrodiagnostic evidence of mild chronic left lumbosacral radiculopathy, mainly involving left L4-5 myotomes, there is no evidence of active process.  In addition, there is evidence of mild length dependent axonal sensorimotor polyneuropathy.    INTERPRETING PHYSICIAN:   Marcial Pacas M.D. Ph.D. [facility] [address] [place], [place] [address] [phone]

## 2015-05-22 NOTE — Progress Notes (Signed)
Electrodiagnostic study May 21 2015 showed evidence of mild chronic left lumbosacral radiculopathy, mainly involving left L4-5 myotomes, there is no evidence of active process. In addition, there is evidence of mild length dependent axonal sensorimotor polyneuropathy.

## 2015-05-23 DIAGNOSIS — M4806 Spinal stenosis, lumbar region: Secondary | ICD-10-CM | POA: Diagnosis not present

## 2015-06-04 ENCOUNTER — Ambulatory Visit (INDEPENDENT_AMBULATORY_CARE_PROVIDER_SITE_OTHER): Payer: Medicare Other | Admitting: Cardiovascular Disease

## 2015-06-04 ENCOUNTER — Encounter: Payer: Self-pay | Admitting: Cardiovascular Disease

## 2015-06-04 VITALS — BP 150/78 | HR 76 | Ht 63.0 in | Wt 130.0 lb

## 2015-06-04 DIAGNOSIS — Z7901 Long term (current) use of anticoagulants: Secondary | ICD-10-CM

## 2015-06-04 DIAGNOSIS — R600 Localized edema: Secondary | ICD-10-CM | POA: Diagnosis not present

## 2015-06-04 DIAGNOSIS — I10 Essential (primary) hypertension: Secondary | ICD-10-CM

## 2015-06-04 DIAGNOSIS — I5032 Chronic diastolic (congestive) heart failure: Secondary | ICD-10-CM

## 2015-06-04 DIAGNOSIS — R208 Other disturbances of skin sensation: Secondary | ICD-10-CM | POA: Diagnosis not present

## 2015-06-04 DIAGNOSIS — I4892 Unspecified atrial flutter: Secondary | ICD-10-CM | POA: Diagnosis not present

## 2015-06-04 DIAGNOSIS — I455 Other specified heart block: Secondary | ICD-10-CM | POA: Diagnosis not present

## 2015-06-04 DIAGNOSIS — I4891 Unspecified atrial fibrillation: Secondary | ICD-10-CM | POA: Diagnosis not present

## 2015-06-04 DIAGNOSIS — R7309 Other abnormal glucose: Secondary | ICD-10-CM | POA: Diagnosis not present

## 2015-06-04 DIAGNOSIS — I480 Paroxysmal atrial fibrillation: Secondary | ICD-10-CM

## 2015-06-04 LAB — CUP PACEART INCLINIC DEVICE CHECK
Brady Statistic AP VP Percent: 1.62 %
Brady Statistic AP VS Percent: 83.64 %
Brady Statistic AS VP Percent: 5.58 %
Brady Statistic RA Percent Paced: 85.26 %
Brady Statistic RV Percent Paced: 7.2 %
Date Time Interrogation Session: 20170228113925
Implantable Lead Implant Date: 20151215
Implantable Lead Location: 753859
Implantable Lead Model: 5076
Implantable Lead Model: 5076
Lead Channel Impedance Value: 418 Ohm
Lead Channel Impedance Value: 475 Ohm
Lead Channel Impedance Value: 475 Ohm
Lead Channel Impedance Value: 570 Ohm
Lead Channel Pacing Threshold Pulse Width: 0.4 ms
Lead Channel Sensing Intrinsic Amplitude: 18.875 mV
Lead Channel Sensing Intrinsic Amplitude: 19 mV
Lead Channel Sensing Intrinsic Amplitude: 2.25 mV
Lead Channel Setting Pacing Amplitude: 2 V
Lead Channel Setting Sensing Sensitivity: 2 mV
MDC IDC LEAD IMPLANT DT: 20151215
MDC IDC LEAD LOCATION: 753860
MDC IDC MSMT BATTERY REMAINING LONGEVITY: 104 mo
MDC IDC MSMT BATTERY VOLTAGE: 3.02 V
MDC IDC MSMT LEADCHNL RA PACING THRESHOLD AMPLITUDE: 0.625 V
MDC IDC MSMT LEADCHNL RA SENSING INTR AMPL: 2.875 mV
MDC IDC MSMT LEADCHNL RV PACING THRESHOLD AMPLITUDE: 0.875 V
MDC IDC MSMT LEADCHNL RV PACING THRESHOLD PULSEWIDTH: 0.4 ms
MDC IDC SET LEADCHNL RA PACING AMPLITUDE: 1.5 V
MDC IDC SET LEADCHNL RV PACING PULSEWIDTH: 0.4 ms
MDC IDC STAT BRADY AS VS PERCENT: 9.16 %
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
## 2015-06-04 NOTE — Patient Instructions (Signed)
Remote monitoring is used to monitor your Pacemaker or ICD from home. This monitoring reduces the number of office visits required to check your device to one time per year. It allows [place] to monitor the functioning of your device to ensure it is working properly. You are scheduled for a device check from home on Sep 02, 2015. You may send your transmission at any time that day. If you have a wireless device, the transmission will be sent automatically. After your physician reviews your transmission, you will receive a postcard with your next transmission date.  Dr. Sallyanne Kuster recommends that you schedule a follow-up appointment in: [place] ([facility])

## 2015-06-17 DIAGNOSIS — M5416 Radiculopathy, lumbar region: Secondary | ICD-10-CM | POA: Diagnosis not present

## 2015-07-04 DIAGNOSIS — M5416 Radiculopathy, lumbar region: Secondary | ICD-10-CM | POA: Diagnosis not present

## 2015-08-05 ENCOUNTER — Other Ambulatory Visit: Payer: Self-pay | Admitting: Cardiovascular Disease

## 2015-08-05 DIAGNOSIS — M5416 Radiculopathy, lumbar region: Secondary | ICD-10-CM | POA: Diagnosis not present

## 2015-08-05 DIAGNOSIS — M791 Myalgia: Secondary | ICD-10-CM | POA: Diagnosis not present

## 2015-08-06 NOTE — Telephone Encounter (Signed)
REFILL 

## 2015-08-28 ENCOUNTER — Other Ambulatory Visit: Payer: Self-pay | Admitting: Cardiovascular Disease

## 2015-08-28 MED ORDER — DILTIAZEM HCL ER BEADS 360 MG PO CP24: 360.0000 mg | ORAL_CAPSULE | Freq: Every day | ORAL | Status: DC

## 2015-08-28 NOTE — Telephone Encounter (Signed)
Diltiazem Rx(s) sent to pharmacy electronically.

## 2015-09-03 ENCOUNTER — Telehealth: Payer: Self-pay | Admitting: Cardiology

## 2015-09-03 ENCOUNTER — Ambulatory Visit (INDEPENDENT_AMBULATORY_CARE_PROVIDER_SITE_OTHER): Payer: Medicare Other | Admitting: *Deleted

## 2015-09-03 DIAGNOSIS — I480 Paroxysmal atrial fibrillation: Secondary | ICD-10-CM

## 2015-09-03 NOTE — Telephone Encounter (Signed)
Transmission received.

## 2015-09-04 NOTE — Progress Notes (Signed)
Carelink Summary Report / Loop Recorder 

## 2015-09-17 LAB — CUP PACEART REMOTE DEVICE CHECK
Battery Remaining Longevity: 105 mo
Battery Voltage: 3.02 V
Brady Statistic AS VP Percent: 0 %
Brady Statistic AS VS Percent: 0.61 %
Brady Statistic RV Percent Paced: 1.99 %
Date Time Interrogation Session: 20170530184627
Implantable Lead Implant Date: 20151215
Implantable Lead Location: 753859
Implantable Lead Model: 5076
Lead Channel Impedance Value: 380 Ohm
Lead Channel Impedance Value: 437 Ohm
Lead Channel Pacing Threshold Amplitude: 0.625 V
Lead Channel Pacing Threshold Amplitude: 0.875 V
Lead Channel Pacing Threshold Pulse Width: 0.4 ms
Lead Channel Pacing Threshold Pulse Width: 0.4 ms
Lead Channel Sensing Intrinsic Amplitude: 1.625 mV
Lead Channel Sensing Intrinsic Amplitude: 1.625 mV
Lead Channel Sensing Intrinsic Amplitude: 15.875 mV
Lead Channel Setting Pacing Amplitude: 1.5 V
Lead Channel Setting Pacing Amplitude: 2 V
Lead Channel Setting Pacing Pulse Width: 0.4 ms
Lead Channel Setting Sensing Sensitivity: 2 mV
MDC IDC LEAD IMPLANT DT: 20151215
MDC IDC LEAD LOCATION: 753860
MDC IDC MSMT LEADCHNL RA IMPEDANCE VALUE: 437 Ohm
MDC IDC MSMT LEADCHNL RA IMPEDANCE VALUE: 513 Ohm
MDC IDC MSMT LEADCHNL RV SENSING INTR AMPL: 15.875 mV
MDC IDC STAT BRADY AP VP PERCENT: 1.99 %
MDC IDC STAT BRADY AP VS PERCENT: 97.4 %
MDC IDC STAT BRADY RA PERCENT PACED: 99.38 %

## 2015-09-24 ENCOUNTER — Encounter: Payer: Self-pay | Admitting: Cardiology

## 2015-09-28 ENCOUNTER — Other Ambulatory Visit: Payer: Self-pay | Admitting: Neurology

## 2015-09-29 ENCOUNTER — Other Ambulatory Visit: Payer: Self-pay | Admitting: Cardiovascular Disease

## 2015-09-30 NOTE — Telephone Encounter (Signed)
Rx(s) sent to pharmacy electronically.  

## 2015-10-16 ENCOUNTER — Ambulatory Visit (INDEPENDENT_AMBULATORY_CARE_PROVIDER_SITE_OTHER): Payer: Medicare Other | Admitting: Family Medicine

## 2015-10-16 VITALS — BP 122/58 | HR 64 | Temp 98.5°F | Resp 16 | Ht 63.0 in | Wt 128.0 lb

## 2015-10-16 DIAGNOSIS — S80812A Abrasion, left lower leg, initial encounter: Secondary | ICD-10-CM

## 2015-10-16 MED ORDER — CEPHALEXIN 500 MG PO CAPS: 500.0000 mg | ORAL_CAPSULE | Freq: Three times a day (TID) | ORAL | Status: DC

## 2015-10-16 MED ORDER — MUPIROCIN 2 % EX OINT: 1.0000 "application " | TOPICAL_OINTMENT | Freq: Two times a day (BID) | CUTANEOUS | Status: DC

## 2015-10-16 NOTE — Patient Instructions (Addendum)
  Great to meet you!  Use mupirocin twice daily with dressing changes, use paper tape to avoid skin tears  Take all of the antibiotics, use a daily pro-biotic or eat yogurt once a day while you are on it.   Follow up with Dr. Shelia Media in 1 week (call for appointment today)   IF you received an x-ray today, you will receive an invoice from [facility]. Please contact [facility] at [phone] with questions or concerns regarding your invoice.   IF you received labwork today, you will receive an invoice from [employer]. Please contact [facility] at [phone] with questions or concerns regarding your invoice.   Our billing staff will not be able to assist you with questions regarding bills from these companies.  You will be contacted with the lab results as soon as they are available. The fastest way to get your results is to activate your My Chart account. Instructions are located on the last page of this paperwork. If you have not heard from [place] regarding the results in 2 weeks, please contact this office.

## 2015-10-16 NOTE — Progress Notes (Signed)
   HPI  Patient presents today to establish care with a leg abrasion  Pt scraped her L anterior shin on a wheelchair. Since that time she has had clear drainage fom it and would like to get it checked out, She had osteo as a child and has been very cautious since then .  She does not have any redness, warmth, or purulence. She has yellow skin at the base of the wound she is concerned about  Denies fever, chills, sweats, or feeling ill in any way.   She is tolerating food and fluid normally.     PMH: Smoking status noted PSH + for appe, hysterectomy, and joint replacement- R AKA PMH- HTN, thyroid disease A fib (on eliquis) diastolic CHF, s/p pacemaker Social- no soking, occasional alcohol use ROS: Per HPI  Objective: BP 122/58 mmHg  Pulse 64  Temp(Src) 98.5 F (36.9 C) (Oral)  Resp 16  Ht 5\' 3"  (1.6 m)  Wt 128 lb (58.06 kg)  BMI 22.68 kg/m2  SpO2 96% Gen: NAD, alert, cooperative with exam HEENT: NCAT CV: RRR, good S1/S2, no murmur Resp: CTABL, no wheezes, non-labored Ext: No edema, warm Neuro: Alert and oriented, No gross deficits  Skin: 4.2 cm X 1.2 cm lesion on the anterio L shin, No tnenderness to palp, no warmth, and no purulent drainage, small amount of clear drainage present within tegaderm which was removed,  Small amount of yellow tissue centrally loscated  Assessment and plan:  # Abrasion/skin tear Shallow, no induration and no purulence.  Considering drainage and yellow tissue I covered for infection with keflex, although it does not appear to be cellulitic and does not have any fluctuance Mupirocin BID with dressing changes F/u with PCP in 1 week.   Meds ordered this encounter  Medications  . cephALEXin (KEFLEX) 500 MG capsule    Sig: Take 1 capsule (500 mg total) by mouth 3 (three) times daily.    Dispense:  21 capsule    Refill:  0  . mupirocin ointment (BACTROBAN) 2 %    Sig: Place 1 application into the nose 2 (two) times daily.    Dispense:  22 g     Refill:  0    Kenn File, MD 3:01 PM

## 2015-10-18 ENCOUNTER — Telehealth: Payer: Self-pay

## 2015-10-18 NOTE — Telephone Encounter (Signed)
Pt has a leg wound and is wanting to do water exercises and wants to know if she can use tagermal and do her water [employer] number [phone]

## 2015-10-19 NOTE — Telephone Encounter (Signed)
No water aerobics until the wound resolved.  Philis Fendt, MS, PA-C 1:44 PM, 10/19/2015

## 2015-10-19 NOTE — Telephone Encounter (Signed)
Patient was seen for a leg abrasion here ath the office on 10/16/15. She would like to know if she can use tagermal and do water arobics. Please advise.

## 2015-10-21 NOTE — Telephone Encounter (Signed)
Attempted to contact pt, left VM stating advice

## 2015-10-22 ENCOUNTER — Encounter: Payer: Self-pay | Admitting: Cardiovascular Disease

## 2015-10-24 DIAGNOSIS — S81812D Laceration without foreign body, left lower leg, subsequent encounter: Secondary | ICD-10-CM | POA: Diagnosis not present

## 2015-11-05 ENCOUNTER — Other Ambulatory Visit: Payer: Self-pay | Admitting: Cardiovascular Disease

## 2015-11-14 DIAGNOSIS — E039 Hypothyroidism, unspecified: Secondary | ICD-10-CM | POA: Diagnosis not present

## 2015-11-14 DIAGNOSIS — I10 Essential (primary) hypertension: Secondary | ICD-10-CM | POA: Diagnosis not present

## 2015-11-14 DIAGNOSIS — Z0000 Encounter for general adult medical examination without abnormal findings: Secondary | ICD-10-CM | POA: Diagnosis not present

## 2015-11-14 DIAGNOSIS — R7303 Prediabetes: Secondary | ICD-10-CM | POA: Diagnosis not present

## 2015-11-14 DIAGNOSIS — D539 Nutritional anemia, unspecified: Secondary | ICD-10-CM | POA: Diagnosis not present

## 2015-11-14 DIAGNOSIS — E7800 Pure hypercholesterolemia, unspecified: Secondary | ICD-10-CM | POA: Diagnosis not present

## 2015-11-14 DIAGNOSIS — N390 Urinary tract infection, site not specified: Secondary | ICD-10-CM | POA: Diagnosis not present

## 2015-11-18 DIAGNOSIS — D649 Anemia, unspecified: Secondary | ICD-10-CM | POA: Diagnosis not present

## 2015-11-21 DIAGNOSIS — D509 Iron deficiency anemia, unspecified: Secondary | ICD-10-CM | POA: Diagnosis not present

## 2015-11-21 DIAGNOSIS — K5730 Diverticulosis of large intestine without perforation or abscess without bleeding: Secondary | ICD-10-CM | POA: Diagnosis not present

## 2015-11-21 DIAGNOSIS — E875 Hyperkalemia: Secondary | ICD-10-CM | POA: Diagnosis not present

## 2015-11-21 DIAGNOSIS — G4733 Obstructive sleep apnea (adult) (pediatric): Secondary | ICD-10-CM | POA: Diagnosis not present

## 2015-11-21 DIAGNOSIS — I10 Essential (primary) hypertension: Secondary | ICD-10-CM | POA: Diagnosis not present

## 2015-11-25 DIAGNOSIS — D509 Iron deficiency anemia, unspecified: Secondary | ICD-10-CM | POA: Diagnosis not present

## 2015-11-25 DIAGNOSIS — I4891 Unspecified atrial fibrillation: Secondary | ICD-10-CM | POA: Diagnosis not present

## 2015-11-25 DIAGNOSIS — Z950 Presence of cardiac pacemaker: Secondary | ICD-10-CM | POA: Diagnosis not present

## 2015-11-25 DIAGNOSIS — K625 Hemorrhage of anus and rectum: Secondary | ICD-10-CM | POA: Diagnosis not present

## 2015-11-25 DIAGNOSIS — Z7902 Long term (current) use of antithrombotics/antiplatelets: Secondary | ICD-10-CM | POA: Diagnosis not present

## 2015-11-29 ENCOUNTER — Telehealth: Payer: Self-pay | Admitting: Cardiovascular Disease

## 2015-11-29 ENCOUNTER — Encounter: Payer: Self-pay | Admitting: Cardiovascular Disease

## 2015-11-29 NOTE — Telephone Encounter (Signed)
New Message  Request for surgical clearance:  1. What type of surgery is being performed? EGD and Flex SG   2. When is this surgery scheduled? Not dated yet  3. Are there any medications that need to be held prior to surgery and how long? Eliquis 2.5mg  Janett Billow states pt need to stop med day of procedure and day after  4. Name of physician performing surgery? Dr. Cristina Gong   5. What is your office phone and fax number? [phone] Fax [phone]

## 2015-11-29 NOTE — Telephone Encounter (Signed)
Clearance routed to MD Croitoru.

## 2015-11-29 NOTE — Telephone Encounter (Signed)
Sent via epic 

## 2015-12-01 ENCOUNTER — Other Ambulatory Visit: Payer: Self-pay | Admitting: Neurology

## 2015-12-03 ENCOUNTER — Encounter: Payer: Self-pay | Admitting: Cardiovascular Disease

## 2015-12-03 ENCOUNTER — Telehealth: Payer: Self-pay | Admitting: Cardiovascular Disease

## 2015-12-03 ENCOUNTER — Ambulatory Visit (INDEPENDENT_AMBULATORY_CARE_PROVIDER_SITE_OTHER): Payer: Medicare Other | Admitting: Cardiovascular Disease

## 2015-12-03 VITALS — BP 140/67 | HR 81 | Ht 63.0 in | Wt 135.2 lb

## 2015-12-03 DIAGNOSIS — Z950 Presence of cardiac pacemaker: Secondary | ICD-10-CM

## 2015-12-03 DIAGNOSIS — I480 Paroxysmal atrial fibrillation: Secondary | ICD-10-CM

## 2015-12-03 DIAGNOSIS — I495 Sick sinus syndrome: Secondary | ICD-10-CM

## 2015-12-03 DIAGNOSIS — I4892 Unspecified atrial flutter: Secondary | ICD-10-CM

## 2015-12-03 DIAGNOSIS — I10 Essential (primary) hypertension: Secondary | ICD-10-CM

## 2015-12-03 DIAGNOSIS — I5032 Chronic diastolic (congestive) heart failure: Secondary | ICD-10-CM

## 2015-12-03 DIAGNOSIS — Z7901 Long term (current) use of anticoagulants: Secondary | ICD-10-CM

## 2015-12-03 NOTE — Telephone Encounter (Signed)
New message        Calling to check the status on an EDG and flex sig clearance.  They are not on epic.  Please call with a verbal clearance

## 2015-12-03 NOTE — Telephone Encounter (Signed)
Forward to  A.Truitt,CMA. Patient had an appointment earlier today.

## 2015-12-03 NOTE — Telephone Encounter (Signed)
Clearance letter from 11/29/15 printed and faxed to [place] at [facility].

## 2015-12-03 NOTE — Patient Instructions (Signed)
Dr Sallyanne Kuster has recommended making the following medication changes: 1. STOP Eliquis 2. HOLD Pravastatin - RESTART in 1 month if your aches and pains don't go away  Dr Sallyanne Kuster recommends that you schedule a follow-up appointment in 3 months with a pacemaker check.  If you need a refill on your cardiac medications before your next appointment, please call your pharmacy.
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
## 2015-12-10 DIAGNOSIS — R197 Diarrhea, unspecified: Secondary | ICD-10-CM | POA: Diagnosis not present

## 2015-12-18 DIAGNOSIS — J4521 Mild intermittent asthma with (acute) exacerbation: Secondary | ICD-10-CM | POA: Diagnosis not present

## 2015-12-23 DIAGNOSIS — D1801 Hemangioma of skin and subcutaneous tissue: Secondary | ICD-10-CM | POA: Diagnosis not present

## 2015-12-23 DIAGNOSIS — L820 Inflamed seborrheic keratosis: Secondary | ICD-10-CM | POA: Diagnosis not present

## 2015-12-23 DIAGNOSIS — Z85828 Personal history of other malignant neoplasm of skin: Secondary | ICD-10-CM | POA: Diagnosis not present

## 2015-12-23 DIAGNOSIS — D509 Iron deficiency anemia, unspecified: Secondary | ICD-10-CM | POA: Diagnosis not present

## 2015-12-23 DIAGNOSIS — L821 Other seborrheic keratosis: Secondary | ICD-10-CM | POA: Diagnosis not present

## 2015-12-23 DIAGNOSIS — L570 Actinic keratosis: Secondary | ICD-10-CM | POA: Diagnosis not present

## 2015-12-23 DIAGNOSIS — L812 Freckles: Secondary | ICD-10-CM | POA: Diagnosis not present

## 2016-01-06 ENCOUNTER — Encounter (HOSPITAL_COMMUNITY): Payer: Self-pay | Admitting: *Deleted

## 2016-01-07 ENCOUNTER — Other Ambulatory Visit: Payer: Self-pay | Admitting: Cardiovascular Disease

## 2016-01-07 LAB — CUP PACEART INCLINIC DEVICE CHECK
Battery Remaining Longevity: 102 mo
Battery Voltage: 3.02 V
Brady Statistic AP VP Percent: 1.94 %
Brady Statistic AS VP Percent: 0 %
Brady Statistic AS VS Percent: 0.52 %
Brady Statistic RA Percent Paced: 99.48 %
Brady Statistic RV Percent Paced: 1.94 %
Date Time Interrogation Session: 20170829132859
Implantable Lead Location: 753860
Implantable Lead Model: 5076
Implantable Lead Model: 5076
Lead Channel Impedance Value: 361 Ohm
Lead Channel Impedance Value: 418 Ohm
Lead Channel Pacing Threshold Pulse Width: 0.4 ms
Lead Channel Sensing Intrinsic Amplitude: 1.25 mV
Lead Channel Sensing Intrinsic Amplitude: 1.25 mV
Lead Channel Sensing Intrinsic Amplitude: 12.25 mV
Lead Channel Sensing Intrinsic Amplitude: 12.25 mV
Lead Channel Setting Pacing Amplitude: 1.5 V
Lead Channel Setting Sensing Sensitivity: 2 mV
MDC IDC LEAD IMPLANT DT: 20151215
MDC IDC LEAD IMPLANT DT: 20151215
MDC IDC LEAD LOCATION: 753859
MDC IDC MSMT LEADCHNL RA IMPEDANCE VALUE: 418 Ohm
MDC IDC MSMT LEADCHNL RA IMPEDANCE VALUE: 475 Ohm
MDC IDC MSMT LEADCHNL RA PACING THRESHOLD AMPLITUDE: 0.625 V
MDC IDC MSMT LEADCHNL RV PACING THRESHOLD AMPLITUDE: 0.875 V
MDC IDC MSMT LEADCHNL RV PACING THRESHOLD PULSEWIDTH: 0.4 ms
MDC IDC SET LEADCHNL RV PACING AMPLITUDE: 2 V
MDC IDC SET LEADCHNL RV PACING PULSEWIDTH: 0.4 ms
MDC IDC STAT BRADY AP VS PERCENT: 97.54 %

## 2016-01-07 NOTE — Telephone Encounter (Signed)
Rx request sent to pharmacy.  

## 2016-01-08 ENCOUNTER — Encounter: Payer: Self-pay | Admitting: Cardiovascular Disease

## 2016-01-09 ENCOUNTER — Ambulatory Visit (HOSPITAL_COMMUNITY)
Admission: RE | Admit: 2016-01-09 | Discharge: 2016-01-09 | Disposition: A | Discharge status: Nursing Home (Includes ICF) | Payer: Medicare Other | Source: Ambulatory Visit | Attending: Gastroenterology | Admitting: Gastroenterology

## 2016-01-09 ENCOUNTER — Encounter (HOSPITAL_COMMUNITY): Payer: Self-pay | Admitting: *Deleted

## 2016-01-09 ENCOUNTER — Encounter (HOSPITAL_COMMUNITY)
Admission: RE | Disposition: A | Discharge status: Nursing Home (Includes ICF) | Payer: Self-pay | Source: Ambulatory Visit | Attending: Gastroenterology

## 2016-01-09 ENCOUNTER — Ambulatory Visit (HOSPITAL_COMMUNITY): Payer: Medicare Other | Admitting: Anesthesiology

## 2016-01-09 ENCOUNTER — Other Ambulatory Visit: Payer: Self-pay | Admitting: Gastroenterology

## 2016-01-09 ENCOUNTER — Telehealth: Payer: Self-pay

## 2016-01-09 DIAGNOSIS — K259 Gastric ulcer, unspecified as acute or chronic, without hemorrhage or perforation: Secondary | ICD-10-CM | POA: Diagnosis not present

## 2016-01-09 DIAGNOSIS — Z950 Presence of cardiac pacemaker: Secondary | ICD-10-CM | POA: Insufficient documentation

## 2016-01-09 DIAGNOSIS — I480 Paroxysmal atrial fibrillation: Secondary | ICD-10-CM | POA: Diagnosis not present

## 2016-01-09 DIAGNOSIS — Z89611 Acquired absence of right leg above knee: Secondary | ICD-10-CM | POA: Diagnosis not present

## 2016-01-09 DIAGNOSIS — D649 Anemia, unspecified: Secondary | ICD-10-CM | POA: Diagnosis not present

## 2016-01-09 DIAGNOSIS — G629 Polyneuropathy, unspecified: Secondary | ICD-10-CM | POA: Diagnosis not present

## 2016-01-09 DIAGNOSIS — Z87891 Personal history of nicotine dependence: Secondary | ICD-10-CM | POA: Insufficient documentation

## 2016-01-09 DIAGNOSIS — I2510 Atherosclerotic heart disease of native coronary artery without angina pectoris: Secondary | ICD-10-CM | POA: Diagnosis not present

## 2016-01-09 DIAGNOSIS — D500 Iron deficiency anemia secondary to blood loss (chronic): Secondary | ICD-10-CM | POA: Diagnosis not present

## 2016-01-09 DIAGNOSIS — E785 Hyperlipidemia, unspecified: Secondary | ICD-10-CM | POA: Diagnosis not present

## 2016-01-09 DIAGNOSIS — G4730 Sleep apnea, unspecified: Secondary | ICD-10-CM | POA: Diagnosis not present

## 2016-01-09 DIAGNOSIS — E039 Hypothyroidism, unspecified: Secondary | ICD-10-CM | POA: Insufficient documentation

## 2016-01-09 DIAGNOSIS — K5730 Diverticulosis of large intestine without perforation or abscess without bleeding: Secondary | ICD-10-CM | POA: Diagnosis not present

## 2016-01-09 DIAGNOSIS — K449 Diaphragmatic hernia without obstruction or gangrene: Secondary | ICD-10-CM | POA: Insufficient documentation

## 2016-01-09 DIAGNOSIS — D509 Iron deficiency anemia, unspecified: Secondary | ICD-10-CM | POA: Diagnosis not present

## 2016-01-09 DIAGNOSIS — K31819 Angiodysplasia of stomach and duodenum without bleeding: Secondary | ICD-10-CM | POA: Insufficient documentation

## 2016-01-09 DIAGNOSIS — K219 Gastro-esophageal reflux disease without esophagitis: Secondary | ICD-10-CM | POA: Insufficient documentation

## 2016-01-09 DIAGNOSIS — I10 Essential (primary) hypertension: Secondary | ICD-10-CM | POA: Insufficient documentation

## 2016-01-09 DIAGNOSIS — Z8249 Family history of ischemic heart disease and other diseases of the circulatory system: Secondary | ICD-10-CM | POA: Insufficient documentation

## 2016-01-09 DIAGNOSIS — K921 Melena: Secondary | ICD-10-CM | POA: Diagnosis not present

## 2016-01-09 DIAGNOSIS — Q2733 Arteriovenous malformation of digestive system vessel: Secondary | ICD-10-CM | POA: Diagnosis not present

## 2016-01-09 DIAGNOSIS — K625 Hemorrhage of anus and rectum: Secondary | ICD-10-CM | POA: Insufficient documentation

## 2016-01-09 HISTORY — PX: ESOPHAGOGASTRODUODENOSCOPY (EGD) WITH PROPOFOL: SHX5813

## 2016-01-09 HISTORY — PX: FLEXIBLE SIGMOIDOSCOPY: SHX5431

## 2016-01-09 SURGERY — ESOPHAGOGASTRODUODENOSCOPY (EGD) WITH PROPOFOL
Anesthesia: Monitor Anesthesia Care

## 2016-01-09 MED ORDER — LIDOCAINE 2% (20 MG/ML) 5 ML SYRINGE
INTRAMUSCULAR | Status: DC | PRN
  Administered 2016-01-09: 50 mg via INTRAVENOUS

## 2016-01-09 MED ORDER — SODIUM CHLORIDE 0.9 % IV SOLN
INTRAVENOUS | Status: DC
Start: 2016-01-09 — End: 2016-01-09

## 2016-01-09 MED ORDER — PROPOFOL 10 MG/ML IV BOLUS
INTRAVENOUS | Status: DC | PRN
  Administered 2016-01-09 (×2): 20 mg via INTRAVENOUS
  Administered 2016-01-09: 50 mg via INTRAVENOUS
  Administered 2016-01-09 (×2): 20 mg via INTRAVENOUS

## 2016-01-09 MED ORDER — PROPOFOL 10 MG/ML IV BOLUS
INTRAVENOUS | Status: AC
  Filled 2016-01-09: qty 20

## 2016-01-09 MED ORDER — LIDOCAINE 2% (20 MG/ML) 5 ML SYRINGE
INTRAMUSCULAR | Status: AC
  Filled 2016-01-09: qty 5

## 2016-01-09 SURGICAL SUPPLY — 14 items

## 2016-01-09 NOTE — Transfer of Care (Signed)
Immediate Anesthesia Transfer of Care Note  Patient: Kelsey Joseph  Procedure(s) Performed: Procedure(s): ESOPHAGOGASTRODUODENOSCOPY (EGD) WITH PROPOFOL (N/A) FLEXIBLE SIGMOIDOSCOPY (N/A)  Patient Location: PACU  Anesthesia Type:MAC  Level of Consciousness: awake, alert  and oriented  Airway & Oxygen Therapy: Patient Spontanous Breathing and Patient connected to nasal cannula oxygen  Post-op Assessment: Report given to RN and Post -op Vital signs reviewed and stable  Post vital signs: Reviewed and stable  Last Vitals:  Vitals:   01/09/16 1014  BP: (!) 178/64  Pulse: 64  Resp: 12  Temp: 36.4 C    Last Pain:  Vitals:   01/09/16 1014  TempSrc: Oral         Complications: No apparent anesthesia complications

## 2016-01-09 NOTE — Anesthesia Postprocedure Evaluation (Signed)
Anesthesia Post Note  Patient: Kelsey Joseph  Procedure(s) Performed: Procedure(s) (LRB): ESOPHAGOGASTRODUODENOSCOPY (EGD) WITH PROPOFOL (N/A) FLEXIBLE SIGMOIDOSCOPY (N/A)  Patient location during evaluation: PACU Anesthesia Type: MAC Level of consciousness: awake and alert Pain management: pain level controlled Vital Signs Assessment: post-procedure vital signs reviewed and stable Respiratory status: spontaneous breathing, nonlabored ventilation, respiratory function stable and patient connected to nasal cannula oxygen Cardiovascular status: stable and blood pressure returned to baseline Anesthetic complications: no    Last Vitals:  Vitals:   01/09/16 1140 01/09/16 1147  BP:  (!) 118/48  Pulse:  61  Resp:  17  Temp: 36.1 C 36.1 C    Last Pain:  Vitals:   01/09/16 1147  TempSrc: Axillary                 Sarya Linenberger S

## 2016-01-09 NOTE — Telephone Encounter (Signed)
Faxed signed "perioperative prescription for implanted cardiac device programming" to [facility] testing on 01/07/16.

## 2016-01-09 NOTE — Anesthesia Preprocedure Evaluation (Signed)
Anesthesia Evaluation  Patient identified by MRN, date of birth, ID band Patient awake    Reviewed: Allergy & Precautions, NPO status , Patient's Chart, lab work & pertinent test results  Airway Mallampati: II   Neck ROM: full    Dental   Pulmonary sleep apnea , former smoker,    breath sounds clear to auscultation       Cardiovascular hypertension, + dysrhythmias Atrial Fibrillation + pacemaker  Rhythm:regular Rate:Normal     Neuro/Psych    GI/Hepatic GERD  ,  Endo/Other  Hypothyroidism   Renal/GU      Musculoskeletal   Abdominal   Peds  Hematology   Anesthesia Other Findings   Reproductive/Obstetrics                             Anesthesia Physical Anesthesia Plan  ASA: III  Anesthesia Plan: MAC   Post-op Pain Management:    Induction: Intravenous  Airway Management Planned: Nasal Cannula  Additional Equipment:   Intra-op Plan:   Post-operative Plan:   Informed Consent: I have reviewed the patients History and Physical, chart, labs and discussed the procedure including the risks, benefits and alternatives for the proposed anesthesia with the patient or authorized representative who has indicated his/her understanding and acceptance.     Plan Discussed with: CRNA, Anesthesiologist and Surgeon  Anesthesia Plan Comments:         Anesthesia Quick Evaluation

## 2016-01-09 NOTE — Discharge Instructions (Signed)

## 2016-01-09 NOTE — Op Note (Signed)
[facility] Patient Name: Kelsey Joseph Procedure Date: 01/09/2016 MRN: [id number] Attending MD: Ronald Lobo , MD Date of Birth: Jul 05, 1927 CSN: [id number] Age: 82 Admit Type: Outpatient Procedure:                Flexible Sigmoidoscopy Indications:              Rectal hemorrhage Providers:                Ronald Lobo, MD, Kingsley Plan, RN, Corliss Parish, Technician Referring MD:              Medicines:                Propofol per Anesthesia, None Complications:            No immediate complications. Estimated Blood Loss:     Estimated blood loss: none. Procedure:                Pre-Anesthesia Assessment:                           - Prior to the procedure, a History and Physical                            was performed, and patient medications and                            allergies were reviewed. The patient's tolerance of                            previous anesthesia was also reviewed. The risks                            and benefits of the procedure and the sedation                            options and risks were discussed with the patient.                            All questions were answered, and informed consent                            was obtained. Prior Anticoagulants: The patient has                            taken previous NSAID medication, last dose was 1                            day prior to procedure. ASA Grade Assessment: III -                            A patient with severe systemic disease. After  reviewing the risks and benefits, the patient was                            deemed in satisfactory condition to undergo the                            procedure.                           After obtaining informed consent, the scope was                            passed under direct vision. The [id number]                            scope was introduced through the anus and  advanced                            to the 40 cm from the anal verge. The flexible                            sigmoidoscopy was accomplished without difficulty.                            The patient tolerated the procedure well. The                            quality of the bowel preparation was adequate                            (unprepped, to look for gross lesions or diffuse                            abnormalities). Scope In: Scope Out: Findings:      A large amount of solid stool was found in the rectum, in the sigmoid       colon and in the descending colon. However, it is felt that no major       lesions or diffuse abnormalities would have been missed.      Multiple medium-mouthed diverticula were found in the sigmoid colon.      The exam was otherwise without abnormality. Specifically, no polyps,       masses, or colitis were observed. No major hemorrhoids were seen. Impression:               - Stool in the rectum, in the sigmoid colon and in                            the descending colon.                           - Diverticulosis in the sigmoid colon.                           - The examination was otherwise normal.                           -  No specimens collected.                           - The patient's periodic rectal bleeding is felt                            most likely to be the result of intermittent                            hemorrhoidal engorgement. Moderate Sedation:      This patient was sedated with monitored anesthesia care, not moderate       sedation. Recommendation:           - No repeat sigmoidoscopy colonoscopy due to age,                            and the favorable findings on the current exam.                           - If the patient has recurrent anemia with                            resumption of Eliquis, options include permanent                            cessation of anticoagulation, trying an alternative                             anticoagulant agent, offsetting the tendency for                            anemia with iron transfusions, or as a last resort,                            attempts at ablation of most/all GI tract AVM's                            which would probably require small bowel                            enteroscopy and possibly multiple treatments, and                            as mentioned above, would be of very uncertain                            effectiveness. Procedure Code(s):        --- Professional ---                           [id number], Sigmoidoscopy, flexible; diagnostic,                            including collection of specimen(s) by brushing or  washing, when performed (separate procedure) Diagnosis Code(s):        --- Professional ---                           K62.5, Hemorrhage of anus and rectum CPT copyright 2016 American Medical Association. All rights reserved. The codes documented in this report are preliminary and upon coder review may  be revised to meet current compliance requirements. Ronald Lobo, MD 01/09/2016 11:58:55 AM This report has been signed electronically. Number of Addenda: 0

## 2016-01-09 NOTE — Op Note (Signed)
[facility] Patient Name: Kelsey Joseph Procedure Date: 01/09/2016 MRN: [id number] Attending MD: Ronald Lobo , MD Date of Birth: [birth year] CSN: [id number] Age: 82 Admit Type: Outpatient Procedure:                Upper GI endoscopy Indications:              Suspected upper gastrointestinal bleeding in                            patient with unexplained iron deficiency anemia,                            Heme positive stool while on Eliquis (now stopped),                            also on Meloxicam Providers:                Ronald Lobo, MD, Kingsley Plan, RN, Corliss Parish, Technician Referring MD:              Medicines:                Propofol per Anesthesia Complications:            No immediate complications. Estimated Blood Loss:     Estimated blood loss: none. Procedure:                Pre-Anesthesia Assessment:                           - Prior to the procedure, a History and Physical                            was performed, and patient medications and                            allergies were reviewed. The patient's tolerance of                            previous anesthesia was also reviewed. The risks                            and benefits of the procedure and the sedation                            options and risks were discussed with the patient.                            All questions were answered, and informed consent                            was obtained. Prior Anticoagulants: The patient has                            taken previous NSAID  medication, last dose was 1                            day prior to procedure. ASA Grade Assessment: III -                            A patient with severe systemic disease. After                            reviewing the risks and benefits, the patient was                            deemed in satisfactory condition to undergo the                            procedure.                            After obtaining informed consent, the endoscope was                            passed under direct vision. Throughout the                            procedure, the patient's blood pressure, pulse, and                            oxygen saturations were monitored continuously. The                            was introduced through the mouth, and advanced to                            the second part of duodenum. The upper GI endoscopy                            was accomplished without difficulty. The patient                            tolerated the procedure well. Scope In: Scope Out: Findings:      The larynx was normal.      The examined esophagus was normal.      A 1 cm hiatal hernia was present.      One non-bleeding cratered gastric ulcer with no stigmata of bleeding was       found on the posterior wall of the stomach and on the greater curvature       of the gastric antrum. The lesion was 4 mm in largest dimension.      A single 3 mm no bleeding angiodysplastic lesion was found in the       gastric body.      The cardia and gastric fundus were normal on retroflexion.      Two 3 mm angiodysplastic lesions without bleeding were found in the       second portion of the duodenum.  The exam was otherwise without abnormality. Impression:               - Normal larynx.                           - Normal esophagus.                           - 1 cm hiatal hernia.                           - Non-bleeding gastric ulcer with no stigmata of                            bleeding. 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                           - A single non-bleeding angiodysplastic lesion in                            the stomach.                           - Two non-bleeding angiodysplastic lesions in the                            duodenum.                           - The examination was otherwise  normal.                           - No specimens collected.                           - The patient's chronic anemia could well be due to                            low-grade blood loss, either from the observed                            AVM's (which are often associated with additional                            similar lesions elsewhere in the GI tract) or from                            non-steroidal gastropathy. Moderate Sedation:      This patient was sedated with monitored anesthesia care, not moderate       sedation. Recommendation:           - Perform a flexible sigmoidoscopy today in view of  the patient's history of rectal bleeding.                           - Note that I elected not to attempt ablation on                            the patient's AVM's since such treatment is of                            dubious efficacy, can actually increase bleeding or                            perforation risk, and is unlikely to solve the                            anemia problem unless a single, dominant vascular                            lesion were present. If the patient has recurrent                            anemia with resumption of Eliquis, options include                            permanent cessation of anticoagulation, trying an                            alternative anticoagulant agent, offsetting the                            tendency for anemia with iron transfusions, or as a                            last resort, attempts at ablation of most/all GI                            tract AVM's which would probably require small                            bowel enteroscopy and possibly multiple treatments,                            and as mentioned above, would be of very uncertain                            effectiveness.                           - Resume previous diet.                           - Continue present medications. Procedure Code(s):         --- Professional ---  [id number], Esophagogastroduodenoscopy, flexible,                            transoral; diagnostic, including collection of                            specimen(s) by brushing or washing, when performed                            (separate procedure) Diagnosis Code(s):        --- Professional ---                           K25.9, Gastric ulcer, unspecified as acute or                            chronic, without hemorrhage or perforation                           K31.819, Angiodysplasia of stomach and duodenum                            without bleeding                           D50.9, Iron deficiency anemia, unspecified                           R19.5, Other fecal abnormalities CPT copyright 2016 American Medical Association. All rights reserved. The codes documented in this report are preliminary and upon coder review may  be revised to meet current compliance requirements. Ronald Lobo, MD 01/09/2016 11:51:39 AM This report has been signed electronically. Number of Addenda: 0
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
## 2016-01-10 ENCOUNTER — Encounter (HOSPITAL_COMMUNITY): Payer: Self-pay | Admitting: Gastroenterology

## 2016-01-21 DIAGNOSIS — K31819 Angiodysplasia of stomach and duodenum without bleeding: Secondary | ICD-10-CM | POA: Diagnosis not present

## 2016-01-21 DIAGNOSIS — D509 Iron deficiency anemia, unspecified: Secondary | ICD-10-CM | POA: Diagnosis not present

## 2016-01-21 DIAGNOSIS — Z23 Encounter for immunization: Secondary | ICD-10-CM | POA: Diagnosis not present

## 2016-02-03 ENCOUNTER — Encounter: Payer: Self-pay | Admitting: Cardiovascular Disease

## 2016-02-09 ENCOUNTER — Emergency Department (HOSPITAL_COMMUNITY)
Admission: EM | Admit: 2016-02-09 | Discharge: 2016-02-09 | Disposition: A | Discharge status: Home / Self Care | Payer: Medicare Other | Attending: Emergency Medicine | Admitting: Emergency Medicine

## 2016-02-09 DIAGNOSIS — E039 Hypothyroidism, unspecified: Secondary | ICD-10-CM | POA: Insufficient documentation

## 2016-02-09 DIAGNOSIS — I5032 Chronic diastolic (congestive) heart failure: Secondary | ICD-10-CM | POA: Diagnosis not present

## 2016-02-09 DIAGNOSIS — Z96652 Presence of left artificial knee joint: Secondary | ICD-10-CM | POA: Insufficient documentation

## 2016-02-09 DIAGNOSIS — G546 Phantom limb syndrome with pain: Secondary | ICD-10-CM | POA: Insufficient documentation

## 2016-02-09 DIAGNOSIS — M25551 Pain in right hip: Secondary | ICD-10-CM | POA: Diagnosis present

## 2016-02-09 DIAGNOSIS — Z950 Presence of cardiac pacemaker: Secondary | ICD-10-CM | POA: Diagnosis not present

## 2016-02-09 DIAGNOSIS — Z87891 Personal history of nicotine dependence: Secondary | ICD-10-CM | POA: Diagnosis not present

## 2016-02-09 DIAGNOSIS — Z91040 Latex allergy status: Secondary | ICD-10-CM | POA: Insufficient documentation

## 2016-02-09 DIAGNOSIS — M792 Neuralgia and neuritis, unspecified: Secondary | ICD-10-CM

## 2016-02-09 DIAGNOSIS — M79604 Pain in right leg: Secondary | ICD-10-CM | POA: Diagnosis not present

## 2016-02-09 DIAGNOSIS — I110 Hypertensive heart disease with heart failure: Secondary | ICD-10-CM | POA: Insufficient documentation

## 2016-02-09 DIAGNOSIS — I2510 Atherosclerotic heart disease of native coronary artery without angina pectoris: Secondary | ICD-10-CM | POA: Insufficient documentation

## 2016-02-09 MED ORDER — ONDANSETRON HCL 4 MG/2ML IJ SOLN
4.0000 mg | Freq: Once | INTRAMUSCULAR | Status: AC
  Administered 2016-02-09: 4 mg via INTRAVENOUS
  Filled 2016-02-09: qty 2

## 2016-02-09 MED ORDER — HYDROMORPHONE HCL 2 MG/ML IJ SOLN
0.5000 mg | Freq: Once | INTRAMUSCULAR | Status: AC
Start: 2016-02-09 — End: 2016-02-09
  Administered 2016-02-09: 0.5 mg via INTRAVENOUS
  Filled 2016-02-09: qty 1

## 2016-02-09 MED ORDER — HYDROCODONE-ACETAMINOPHEN 5-325 MG PO TABS: 0.5000 | ORAL_TABLET | ORAL | 0 refills | Status: DC | PRN
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
## 2016-02-09 NOTE — ED Triage Notes (Signed)
Pt from home via [facility]. Sts she was having pain and spasms in her R leg. Hx of R AKA. Says she took a xanex w/ no relieve. Denies pain at this moment. Per family, pt has been a little confused lately, however, pt is A&O x4 on arrival.

## 2016-02-09 NOTE — ED Notes (Signed)
ED Provider at bedside. 

## 2016-02-09 NOTE — ED Notes (Signed)
Family states they understand instructions.Pt home stable with family.

## 2016-02-09 NOTE — ED Provider Notes (Signed)
Pt is now awake and alert.  She is eating breakfast.  Pain is under control.   Isla Pence, MD 02/09/16 [phone]

## 2016-02-17 DIAGNOSIS — G546 Phantom limb syndrome with pain: Secondary | ICD-10-CM | POA: Diagnosis not present

## 2016-02-17 DIAGNOSIS — M1288 Other specific arthropathies, not elsewhere classified, other specified site: Secondary | ICD-10-CM | POA: Diagnosis not present

## 2016-02-25 ENCOUNTER — Ambulatory Visit (INDEPENDENT_AMBULATORY_CARE_PROVIDER_SITE_OTHER): Payer: Medicare Other | Admitting: Cardiovascular Disease

## 2016-02-25 ENCOUNTER — Encounter: Payer: Self-pay | Admitting: Cardiovascular Disease

## 2016-02-25 VITALS — BP 147/70 | HR 65 | Ht 63.0 in | Wt 132.0 lb

## 2016-02-25 DIAGNOSIS — I495 Sick sinus syndrome: Secondary | ICD-10-CM | POA: Diagnosis not present

## 2016-02-25 DIAGNOSIS — I5032 Chronic diastolic (congestive) heart failure: Secondary | ICD-10-CM

## 2016-02-25 DIAGNOSIS — I10 Essential (primary) hypertension: Secondary | ICD-10-CM

## 2016-02-25 DIAGNOSIS — Z950 Presence of cardiac pacemaker: Secondary | ICD-10-CM | POA: Diagnosis not present

## 2016-02-25 DIAGNOSIS — I471 Supraventricular tachycardia: Secondary | ICD-10-CM | POA: Diagnosis not present

## 2016-02-25 DIAGNOSIS — I4719 Other supraventricular tachycardia: Secondary | ICD-10-CM

## 2016-02-25 DIAGNOSIS — I480 Paroxysmal atrial fibrillation: Secondary | ICD-10-CM | POA: Diagnosis not present

## 2016-02-25 DIAGNOSIS — D500 Iron deficiency anemia secondary to blood loss (chronic): Secondary | ICD-10-CM

## 2016-02-25 LAB — CUP PACEART INCLINIC DEVICE CHECK
Battery Voltage: 3.01 V
Brady Statistic AP VP Percent: 3.11 %
Brady Statistic AS VS Percent: 0.47 %
Brady Statistic RV Percent Paced: 3.13 %
Implantable Lead Implant Date: 20151215
Implantable Lead Location: 753859
Implantable Lead Model: 5076
Implantable Pulse Generator Implant Date: 20151215
Lead Channel Impedance Value: 399 Ohm
Lead Channel Impedance Value: 456 Ohm
Lead Channel Impedance Value: 551 Ohm
Lead Channel Pacing Threshold Amplitude: 0.5 V
Lead Channel Pacing Threshold Amplitude: 0.75 V
Lead Channel Setting Pacing Amplitude: 1.5 V
Lead Channel Setting Pacing Amplitude: 2 V
Lead Channel Setting Sensing Sensitivity: 2 mV
MDC IDC LEAD IMPLANT DT: 20151215
MDC IDC LEAD LOCATION: 753860
MDC IDC MSMT BATTERY REMAINING LONGEVITY: 91 mo
MDC IDC MSMT LEADCHNL RA IMPEDANCE VALUE: 456 Ohm
MDC IDC MSMT LEADCHNL RA PACING THRESHOLD PULSEWIDTH: 0.4 ms
MDC IDC MSMT LEADCHNL RA SENSING INTR AMPL: 1.625 mV
MDC IDC MSMT LEADCHNL RA SENSING INTR AMPL: 1.625 mV
MDC IDC MSMT LEADCHNL RV PACING THRESHOLD PULSEWIDTH: 0.4 ms
MDC IDC MSMT LEADCHNL RV SENSING INTR AMPL: 15.75 mV
MDC IDC MSMT LEADCHNL RV SENSING INTR AMPL: 15.75 mV
MDC IDC SESS DTM: 20171121110601
MDC IDC SET LEADCHNL RV PACING PULSEWIDTH: 0.4 ms
MDC IDC STAT BRADY AP VS PERCENT: 96.4 %
MDC IDC STAT BRADY AS VP PERCENT: 0.02 %
MDC IDC STAT BRADY RA PERCENT PACED: 99.51 %

## 2016-02-25 NOTE — Patient Instructions (Addendum)
Dr Sallyanne Kuster recommends that you continue on your current medications as directed. Please refer to the Current Medication list given to you today.  Remote monitoring is used to monitor your Pacemaker of ICD from home. This monitoring reduces the number of office visits required to check your device to one time per year. It allows [place] to keep an eye on the functioning of your device to ensure it is working properly. You are scheduled for a device check from home on Tuesday, February 20th, 2018. You may send your transmission at any time that day. If you have a wireless device, the transmission will be sent automatically. After your physician reviews your transmission, you will receive a postcard with your next transmission date.  Dr Sallyanne Kuster recommends that you schedule a follow-up appointment in 6 months with a pacemaker check. You will receive a reminder letter in the mail two months in advance. If you don't receive a letter, please call our office to schedule the follow-up appointment.  If you need a refill on your cardiac medications before your next appointment, please call your pharmacy.   You have been referred to Chanetta Marshall, NP for consideration of Watchman device.
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
## 2016-02-28 ENCOUNTER — Other Ambulatory Visit: Payer: Self-pay | Admitting: Cardiovascular Disease

## 2016-03-02 ENCOUNTER — Encounter: Payer: Self-pay | Admitting: Internal Medicine

## 2016-03-02 NOTE — Telephone Encounter (Signed)
Rx(s) sent to pharmacy electronically.  

## 2016-03-05 ENCOUNTER — Encounter: Payer: Self-pay | Admitting: Cardiovascular Disease

## 2016-03-09 ENCOUNTER — Ambulatory Visit (INDEPENDENT_AMBULATORY_CARE_PROVIDER_SITE_OTHER): Payer: Medicare Other | Admitting: Internal Medicine

## 2016-03-09 ENCOUNTER — Encounter: Payer: Self-pay | Admitting: Internal Medicine

## 2016-03-09 VITALS — BP 144/68 | HR 74 | Ht 63.0 in | Wt 137.2 lb

## 2016-03-09 DIAGNOSIS — I480 Paroxysmal atrial fibrillation: Secondary | ICD-10-CM

## 2016-03-09 DIAGNOSIS — I495 Sick sinus syndrome: Secondary | ICD-10-CM

## 2016-03-09 DIAGNOSIS — Z950 Presence of cardiac pacemaker: Secondary | ICD-10-CM | POA: Diagnosis not present

## 2016-03-09 DIAGNOSIS — D500 Iron deficiency anemia secondary to blood loss (chronic): Secondary | ICD-10-CM
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
## 2016-03-09 NOTE — Progress Notes (Signed)
Thanks, [name]! Kelsey Joseph 

## 2016-03-09 NOTE — Patient Instructions (Addendum)
Medication Instructions:  Your physician recommends that you continue on your current medications as directed. Please refer to the Current Medication list given to you today.   Labwork: None ordered   Testing/Procedures: Your physician has requested that you have a TEE. During a TEE, sound waves are used to create images of your heart. It provides your doctor with information about the size and shape of your heart and how well your heart's chambers and valves are working. In this test, a transducer is attached to the end of a flexible tube that's guided down your throat and into your esophagus (the tube leading from you mouth to your stomach) to get a more detailed image of your heart. You are not awake for the procedure. Please see the instruction sheet given to you today. For further information please visit [email or web address]  Please arrive at [facility] entrance of [facility] at 8:30 Do not eat or drink after midnight the night prior to procedure Okay to take any medications the morning of the TEE with small sip of water.  Do not take fluid pill Will need someone to drive you home after the procedure    Follow-Up: Your physician recommends that you schedule a follow-up appointment---Amber Lynnell Jude, NP will call after TEE to discuss plan   Any Other Special Instructions Will Be Listed Below (If Applicable).     If you need a refill on your cardiac medications before your next appointment, please call your pharmacy.

## 2016-03-11 DIAGNOSIS — G546 Phantom limb syndrome with pain: Secondary | ICD-10-CM | POA: Diagnosis not present

## 2016-03-11 DIAGNOSIS — Z79890 Hormone replacement therapy (postmenopausal): Secondary | ICD-10-CM | POA: Diagnosis not present

## 2016-03-11 DIAGNOSIS — I2510 Atherosclerotic heart disease of native coronary artery without angina pectoris: Secondary | ICD-10-CM | POA: Diagnosis not present

## 2016-03-11 DIAGNOSIS — Z882 Allergy status to sulfonamides status: Secondary | ICD-10-CM | POA: Diagnosis not present

## 2016-03-11 DIAGNOSIS — Z91040 Latex allergy status: Secondary | ICD-10-CM | POA: Diagnosis not present

## 2016-03-11 DIAGNOSIS — I10 Essential (primary) hypertension: Secondary | ICD-10-CM | POA: Diagnosis not present

## 2016-03-11 DIAGNOSIS — I4891 Unspecified atrial fibrillation: Secondary | ICD-10-CM | POA: Diagnosis not present

## 2016-03-11 DIAGNOSIS — G894 Chronic pain syndrome: Secondary | ICD-10-CM | POA: Diagnosis not present

## 2016-03-11 DIAGNOSIS — Z79899 Other long term (current) drug therapy: Secondary | ICD-10-CM | POA: Diagnosis not present

## 2016-03-11 DIAGNOSIS — E039 Hypothyroidism, unspecified: Secondary | ICD-10-CM | POA: Diagnosis not present

## 2016-03-23 ENCOUNTER — Encounter: Payer: Self-pay | Admitting: Cardiovascular Disease

## 2016-03-23 DIAGNOSIS — M47812 Spondylosis without myelopathy or radiculopathy, cervical region: Secondary | ICD-10-CM | POA: Diagnosis not present

## 2016-04-01 ENCOUNTER — Telehealth: Payer: Self-pay | Admitting: Nurse Practitioner

## 2016-04-01 NOTE — Telephone Encounter (Signed)
Spoke with patient about planned TEE for 04/02/16 to evaluate for Watchman.  Dr Rayann Heman and Dr Burt Knack have discussed and feel that with advanced age, she is currently at prohibitive risks for procedure.  I have reviewed with patient. Will cancel TEE for tomorrow (called scheduling) and I have made follow up appt wit Dr C at the end of January to further discuss anticoagulation.  Pt aware and agrees with plan.   Chanetta Marshall, NP 04/01/2016 3:35 PM

## 2016-04-02 ENCOUNTER — Encounter (HOSPITAL_COMMUNITY): Admission: RE | Payer: Self-pay | Source: Ambulatory Visit

## 2016-04-02 ENCOUNTER — Ambulatory Visit (HOSPITAL_COMMUNITY): Admission: RE | Admit: 2016-04-02 | Payer: Medicare Other | Source: Ambulatory Visit | Admitting: Cardiovascular Disease

## 2016-04-02 SURGERY — ECHOCARDIOGRAM, TRANSESOPHAGEAL
Anesthesia: Moderate Sedation

## 2016-04-09 DIAGNOSIS — H401131 Primary open-angle glaucoma, bilateral, mild stage: Secondary | ICD-10-CM | POA: Diagnosis not present

## 2016-04-09 DIAGNOSIS — H16223 Keratoconjunctivitis sicca, not specified as Sjogren's, bilateral: Secondary | ICD-10-CM | POA: Diagnosis not present

## 2016-04-09 DIAGNOSIS — H02834 Dermatochalasis of left upper eyelid: Secondary | ICD-10-CM | POA: Diagnosis not present

## 2016-04-09 DIAGNOSIS — Z961 Presence of intraocular lens: Secondary | ICD-10-CM | POA: Diagnosis not present

## 2016-04-09 DIAGNOSIS — H02831 Dermatochalasis of right upper eyelid: Secondary | ICD-10-CM | POA: Diagnosis not present

## 2016-05-03 ENCOUNTER — Other Ambulatory Visit: Payer: Self-pay | Admitting: Cardiovascular Disease

## 2016-05-04 ENCOUNTER — Ambulatory Visit: Payer: Medicare Other | Admitting: Cardiovascular Disease

## 2016-05-04 DIAGNOSIS — R7303 Prediabetes: Secondary | ICD-10-CM | POA: Diagnosis not present

## 2016-05-19 ENCOUNTER — Ambulatory Visit (INDEPENDENT_AMBULATORY_CARE_PROVIDER_SITE_OTHER): Payer: Medicare Other | Admitting: Cardiovascular Disease

## 2016-05-19 ENCOUNTER — Encounter: Payer: Self-pay | Admitting: Cardiovascular Disease

## 2016-05-19 VITALS — BP 130/74 | HR 72 | Ht 63.0 in | Wt 142.4 lb

## 2016-05-19 DIAGNOSIS — I480 Paroxysmal atrial fibrillation: Secondary | ICD-10-CM

## 2016-05-19 DIAGNOSIS — I471 Supraventricular tachycardia: Secondary | ICD-10-CM | POA: Diagnosis not present

## 2016-05-19 DIAGNOSIS — I10 Essential (primary) hypertension: Secondary | ICD-10-CM

## 2016-05-19 DIAGNOSIS — I5032 Chronic diastolic (congestive) heart failure: Secondary | ICD-10-CM

## 2016-05-19 DIAGNOSIS — Z950 Presence of cardiac pacemaker: Secondary | ICD-10-CM | POA: Diagnosis not present

## 2016-05-19 DIAGNOSIS — E7800 Pure hypercholesterolemia, unspecified: Secondary | ICD-10-CM | POA: Diagnosis not present

## 2016-05-19 DIAGNOSIS — D500 Iron deficiency anemia secondary to blood loss (chronic): Secondary | ICD-10-CM

## 2016-05-19 DIAGNOSIS — I495 Sick sinus syndrome: Secondary | ICD-10-CM

## 2016-05-19 NOTE — Patient Instructions (Signed)
Dr Sallyanne Kuster recommends that you continue on your current medications as directed. Please refer to the Current Medication list given to you today.  Remote monitoring is used to monitor your Pacemaker of ICD from home. This monitoring reduces the number of office visits required to check your device to one time per year. It allows [place] to keep an eye on the functioning of your device to ensure it is working properly. You are scheduled for a device check from home on Tuesday, May 15th, 2018. You may send your transmission at any time that day. If you have a wireless device, the transmission will be sent automatically. After your physician reviews your transmission, you will receive a postcard with your next transmission date.  Dr Sallyanne Kuster recommends that you schedule a follow-up appointment in 6 months with a pacemaker check. You will receive a reminder letter in the mail two months in advance. If you don't receive a letter, please call our office to schedule the follow-up appointment.  If you need a refill on your cardiac medications before your next appointment, please call your pharmacy.
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
## 2016-05-22 LAB — CUP PACEART INCLINIC DEVICE CHECK
Battery Remaining Longevity: 89 mo
Brady Statistic AP VS Percent: 86.01 %
Brady Statistic AS VS Percent: 0.48 %
Brady Statistic RV Percent Paced: 13.73 %
Implantable Lead Implant Date: 20151215
Implantable Lead Location: 753859
Implantable Lead Location: 753860
Implantable Lead Model: 5076
Lead Channel Impedance Value: 456 Ohm
Lead Channel Impedance Value: 456 Ohm
Lead Channel Impedance Value: 551 Ohm
Lead Channel Pacing Threshold Amplitude: 0.875 V
Lead Channel Pacing Threshold Pulse Width: 0.4 ms
Lead Channel Pacing Threshold Pulse Width: 0.4 ms
Lead Channel Sensing Intrinsic Amplitude: 21.375 mV
Lead Channel Sensing Intrinsic Amplitude: 4.25 mV
Lead Channel Sensing Intrinsic Amplitude: 4.25 mV
Lead Channel Setting Pacing Amplitude: 2 V
Lead Channel Setting Sensing Sensitivity: 2 mV
MDC IDC LEAD IMPLANT DT: 20151215
MDC IDC MSMT BATTERY VOLTAGE: 3.01 V
MDC IDC MSMT LEADCHNL RA PACING THRESHOLD AMPLITUDE: 0.625 V
MDC IDC MSMT LEADCHNL RV IMPEDANCE VALUE: 418 Ohm
MDC IDC MSMT LEADCHNL RV SENSING INTR AMPL: 21.375 mV
MDC IDC PG IMPLANT DT: 20151215
MDC IDC SESS DTM: 20180213143427
MDC IDC SET LEADCHNL RA PACING AMPLITUDE: 1.5 V
MDC IDC SET LEADCHNL RV PACING PULSEWIDTH: 0.4 ms
MDC IDC STAT BRADY AP VP PERCENT: 12.9 %
MDC IDC STAT BRADY AS VP PERCENT: 0.6 %
MDC IDC STAT BRADY RA PERCENT PACED: 98.13 %

## 2016-05-25 ENCOUNTER — Encounter: Payer: Self-pay | Admitting: Cardiovascular Disease

## 2016-06-25 ENCOUNTER — Telehealth: Payer: Self-pay | Admitting: Neurology

## 2016-06-26 ENCOUNTER — Other Ambulatory Visit: Payer: Self-pay | Admitting: Cardiovascular Disease

## 2016-07-06 NOTE — Telephone Encounter (Signed)
Noted/fim 

## 2016-07-06 NOTE — Telephone Encounter (Signed)
Pt called said Dr Krista Blue told her last year she did not need to see her anymore. She said pharmacy failed to let her know this refill had been refused. Pt was advised to call her PCP as they could refill it. She was appreciative and will call PCP  FYI

## 2016-08-06 DIAGNOSIS — M25512 Pain in left shoulder: Secondary | ICD-10-CM | POA: Diagnosis not present

## 2016-08-06 DIAGNOSIS — M25511 Pain in right shoulder: Secondary | ICD-10-CM | POA: Diagnosis not present

## 2016-08-18 ENCOUNTER — Ambulatory Visit: Payer: Medicare Other | Admitting: *Deleted

## 2016-08-18 ENCOUNTER — Telehealth: Payer: Self-pay | Admitting: Cardiovascular Disease

## 2016-08-18 ENCOUNTER — Telehealth: Payer: Self-pay | Admitting: Cardiology

## 2016-08-18 DIAGNOSIS — I495 Sick sinus syndrome: Secondary | ICD-10-CM

## 2016-08-18 NOTE — Telephone Encounter (Signed)
LMOVM reminding pt to send remote transmission.   

## 2016-08-18 NOTE — Telephone Encounter (Signed)
Noted  

## 2016-08-18 NOTE — Telephone Encounter (Signed)
New Message  Pt call requesting to inform Device she will not be able to send transmission today. Pt states she is out of town, but will be able to send it on Thursday. Please call back to discuss if needed

## 2016-08-20 ENCOUNTER — Encounter: Payer: Self-pay | Admitting: Cardiology

## 2016-08-21 ENCOUNTER — Ambulatory Visit (INDEPENDENT_AMBULATORY_CARE_PROVIDER_SITE_OTHER): Payer: Medicare Other | Admitting: *Deleted

## 2016-08-21 DIAGNOSIS — I495 Sick sinus syndrome: Secondary | ICD-10-CM

## 2016-08-22 ENCOUNTER — Encounter: Payer: Self-pay | Admitting: Cardiovascular Disease

## 2016-08-24 NOTE — Progress Notes (Signed)
Remote pacemaker transmission.   

## 2016-08-25 NOTE — Progress Notes (Signed)
Remote pacemaker transmission.   

## 2016-08-26 ENCOUNTER — Encounter: Payer: Self-pay | Admitting: Cardiology

## 2016-08-27 LAB — CUP PACEART REMOTE DEVICE CHECK
Battery Voltage: 3.01 V
Brady Statistic AP VP Percent: 3.88 %
Brady Statistic AP VS Percent: 81.02 %
Brady Statistic AS VS Percent: 5.91 %
Brady Statistic RV Percent Paced: 13.33 %
Implantable Lead Implant Date: 20151215
Implantable Lead Location: 753860
Implantable Lead Model: 5076
Implantable Pulse Generator Implant Date: 20151215
Lead Channel Impedance Value: 380 Ohm
Lead Channel Impedance Value: 418 Ohm
Lead Channel Impedance Value: 437 Ohm
Lead Channel Impedance Value: 494 Ohm
Lead Channel Pacing Threshold Amplitude: 0.625 V
Lead Channel Pacing Threshold Amplitude: 0.75 V
Lead Channel Pacing Threshold Pulse Width: 0.4 ms
Lead Channel Sensing Intrinsic Amplitude: 2 mV
Lead Channel Setting Pacing Amplitude: 1.5 V
MDC IDC LEAD IMPLANT DT: 20151215
MDC IDC LEAD LOCATION: 753859
MDC IDC MSMT BATTERY REMAINING LONGEVITY: 80 mo
MDC IDC MSMT LEADCHNL RA PACING THRESHOLD PULSEWIDTH: 0.4 ms
MDC IDC MSMT LEADCHNL RA SENSING INTR AMPL: 2 mV
MDC IDC MSMT LEADCHNL RV SENSING INTR AMPL: 15.375 mV
MDC IDC MSMT LEADCHNL RV SENSING INTR AMPL: 15.375 mV
MDC IDC SESS DTM: 20180518141003
MDC IDC SET LEADCHNL RV PACING AMPLITUDE: 2 V
MDC IDC SET LEADCHNL RV PACING PULSEWIDTH: 0.4 ms
MDC IDC SET LEADCHNL RV SENSING SENSITIVITY: 2 mV
MDC IDC STAT BRADY AS VP PERCENT: 9.2 %
MDC IDC STAT BRADY RA PERCENT PACED: 81.86 %

## 2016-09-02 DIAGNOSIS — Z6824 Body mass index (BMI) 24.0-24.9, adult: Secondary | ICD-10-CM | POA: Diagnosis not present

## 2016-09-02 DIAGNOSIS — Z01419 Encounter for gynecological examination (general) (routine) without abnormal findings: Secondary | ICD-10-CM | POA: Diagnosis not present

## 2016-09-02 DIAGNOSIS — N8189 Other female genital prolapse: Secondary | ICD-10-CM | POA: Diagnosis not present

## 2016-09-02 DIAGNOSIS — N3281 Overactive bladder: Secondary | ICD-10-CM | POA: Diagnosis not present

## 2016-09-02 DIAGNOSIS — R300 Dysuria: Secondary | ICD-10-CM | POA: Diagnosis not present

## 2016-09-02 DIAGNOSIS — Z1231 Encounter for screening mammogram for malignant neoplasm of breast: Secondary | ICD-10-CM | POA: Diagnosis not present

## 2016-09-09 ENCOUNTER — Telehealth: Payer: Self-pay | Admitting: Cardiology

## 2016-09-09 NOTE — Telephone Encounter (Signed)
Follow Up:  Pt wants to know if her last transmission was received in May?

## 2016-09-09 NOTE — Telephone Encounter (Signed)
LVM for pt informing her that a transmission was received on 5/18.

## 2016-09-16 ENCOUNTER — Telehealth: Payer: Self-pay | Admitting: Neurology

## 2016-09-16 DIAGNOSIS — N816 Rectocele: Secondary | ICD-10-CM | POA: Diagnosis not present

## 2016-09-16 DIAGNOSIS — R8290 Unspecified abnormal findings in urine: Secondary | ICD-10-CM | POA: Diagnosis not present

## 2016-09-16 DIAGNOSIS — R151 Fecal smearing: Secondary | ICD-10-CM | POA: Diagnosis not present

## 2016-09-16 DIAGNOSIS — N3281 Overactive bladder: Secondary | ICD-10-CM | POA: Diagnosis not present

## 2016-09-16 DIAGNOSIS — R32 Unspecified urinary incontinence: Secondary | ICD-10-CM | POA: Diagnosis not present

## 2016-09-16 DIAGNOSIS — M5416 Radiculopathy, lumbar region: Secondary | ICD-10-CM

## 2016-09-16 NOTE — Telephone Encounter (Signed)
Pt said that she does not care much for taking gabapentin (NEURONTIN) 300 MG capsule  she said a friend told her about an injection in place of taking the pill gabapentin (NEURONTIN) 300 MG capsule (she does not know the name of the injection). She would like to know if she could be considered for the injection, please call.

## 2016-09-16 NOTE — Telephone Encounter (Signed)
Pt first seen by Dr. Krista Blue in Jan 2017 for spinal stenosis and was placed on gabapentin 300 mg TID. She returned in Feb for NCV/EMG. Afterwards, she was advised to follow-up and obtain refills from her PCP. She does not know the name of the requested injectable.

## 2016-09-18 NOTE — Addendum Note (Signed)
Addended by: Marcial Pacas on: 09/18/2016 03:48 PM   Modules accepted: Orders

## 2016-09-18 NOTE — Telephone Encounter (Addendum)
Please let patient know I have referred him to pain management for epidural injection for her left lumbar radiculopathy, left low back pain  Normajean Glasgow, MD Pain management physician in [place], [place] Address: [address], [place], [place] [address] Phone: [phone]

## 2016-09-21 ENCOUNTER — Telehealth: Payer: Self-pay | Admitting: Neurology

## 2016-09-21 NOTE — Telephone Encounter (Signed)
Sent Referral to Dr. Mina Marble telephone [phone]- fax [phone]. Left Patient a message relaying Dr. Mina Marble is out if the country until July 5 th .

## 2016-09-21 NOTE — Telephone Encounter (Signed)
I sent referral to Dr. Mina Marble. Dr. Mina Marble is out of the country until July 5 th . I called Patient as well and left her a message. Thanks Hinton Dyer.

## 2016-09-22 NOTE — Telephone Encounter (Addendum)
Called Deanne back and relayed Patient needs epidural steroid injection. Lenore Manner will get patient for July.

## 2016-09-22 NOTE — Telephone Encounter (Signed)
Deanna/[facility] [phone] rec'd referral. She said Dr Mina Marble does not treat chronic pain management. He does injections or epidurals. Please call to discuss

## 2016-10-13 DIAGNOSIS — B078 Other viral warts: Secondary | ICD-10-CM | POA: Diagnosis not present

## 2016-10-13 DIAGNOSIS — G4733 Obstructive sleep apnea (adult) (pediatric): Secondary | ICD-10-CM | POA: Diagnosis not present

## 2016-10-17 ENCOUNTER — Inpatient Hospital Stay (HOSPITAL_COMMUNITY)
Admission: EM | Admit: 2016-10-17 | Discharge: 2016-10-20 | DRG: 280 | Disposition: A | Discharge status: Home / Self Care | Payer: Medicare Other | Attending: Internal Medicine | Admitting: Internal Medicine

## 2016-10-17 ENCOUNTER — Emergency Department (HOSPITAL_COMMUNITY): Payer: Medicare Other

## 2016-10-17 ENCOUNTER — Encounter (HOSPITAL_COMMUNITY): Payer: Self-pay | Admitting: Emergency Medicine

## 2016-10-17 DIAGNOSIS — E7800 Pure hypercholesterolemia, unspecified: Secondary | ICD-10-CM | POA: Diagnosis present

## 2016-10-17 DIAGNOSIS — Z89629 Acquired absence of unspecified hip joint: Secondary | ICD-10-CM

## 2016-10-17 DIAGNOSIS — I5031 Acute diastolic (congestive) heart failure: Secondary | ICD-10-CM

## 2016-10-17 DIAGNOSIS — N183 Chronic kidney disease, stage 3 (moderate): Secondary | ICD-10-CM | POA: Diagnosis not present

## 2016-10-17 DIAGNOSIS — Z87891 Personal history of nicotine dependence: Secondary | ICD-10-CM

## 2016-10-17 DIAGNOSIS — I252 Old myocardial infarction: Secondary | ICD-10-CM

## 2016-10-17 DIAGNOSIS — R079 Chest pain, unspecified: Secondary | ICD-10-CM | POA: Diagnosis not present

## 2016-10-17 DIAGNOSIS — I10 Essential (primary) hypertension: Secondary | ICD-10-CM | POA: Diagnosis present

## 2016-10-17 DIAGNOSIS — E785 Hyperlipidemia, unspecified: Secondary | ICD-10-CM | POA: Diagnosis present

## 2016-10-17 DIAGNOSIS — E1122 Type 2 diabetes mellitus with diabetic chronic kidney disease: Secondary | ICD-10-CM | POA: Diagnosis present

## 2016-10-17 DIAGNOSIS — Z79899 Other long term (current) drug therapy: Secondary | ICD-10-CM

## 2016-10-17 DIAGNOSIS — I4892 Unspecified atrial flutter: Secondary | ICD-10-CM | POA: Diagnosis not present

## 2016-10-17 DIAGNOSIS — R0603 Acute respiratory distress: Secondary | ICD-10-CM | POA: Diagnosis present

## 2016-10-17 DIAGNOSIS — I214 Non-ST elevation (NSTEMI) myocardial infarction: Principal | ICD-10-CM | POA: Diagnosis present

## 2016-10-17 DIAGNOSIS — I480 Paroxysmal atrial fibrillation: Secondary | ICD-10-CM | POA: Diagnosis present

## 2016-10-17 DIAGNOSIS — G4733 Obstructive sleep apnea (adult) (pediatric): Secondary | ICD-10-CM | POA: Diagnosis present

## 2016-10-17 DIAGNOSIS — I110 Hypertensive heart disease with heart failure: Secondary | ICD-10-CM | POA: Diagnosis not present

## 2016-10-17 DIAGNOSIS — Z89612 Acquired absence of left leg above knee: Secondary | ICD-10-CM

## 2016-10-17 DIAGNOSIS — I130 Hypertensive heart and chronic kidney disease with heart failure and stage 1 through stage 4 chronic kidney disease, or unspecified chronic kidney disease: Secondary | ICD-10-CM | POA: Diagnosis not present

## 2016-10-17 DIAGNOSIS — I4891 Unspecified atrial fibrillation: Secondary | ICD-10-CM | POA: Diagnosis present

## 2016-10-17 DIAGNOSIS — Z7901 Long term (current) use of anticoagulants: Secondary | ICD-10-CM

## 2016-10-17 DIAGNOSIS — M7989 Other specified soft tissue disorders: Secondary | ICD-10-CM

## 2016-10-17 DIAGNOSIS — Z950 Presence of cardiac pacemaker: Secondary | ICD-10-CM

## 2016-10-17 DIAGNOSIS — K219 Gastro-esophageal reflux disease without esophagitis: Secondary | ICD-10-CM | POA: Diagnosis present

## 2016-10-17 DIAGNOSIS — E1142 Type 2 diabetes mellitus with diabetic polyneuropathy: Secondary | ICD-10-CM | POA: Diagnosis present

## 2016-10-17 DIAGNOSIS — E039 Hypothyroidism, unspecified: Secondary | ICD-10-CM | POA: Diagnosis present

## 2016-10-17 DIAGNOSIS — N172 Acute kidney failure with medullary necrosis: Secondary | ICD-10-CM

## 2016-10-17 DIAGNOSIS — I5033 Acute on chronic diastolic (congestive) heart failure: Secondary | ICD-10-CM | POA: Diagnosis present

## 2016-10-17 DIAGNOSIS — G4710 Hypersomnia, unspecified: Secondary | ICD-10-CM | POA: Diagnosis present

## 2016-10-17 LAB — URINALYSIS, ROUTINE W REFLEX MICROSCOPIC
Bilirubin Urine: NEGATIVE
GLUCOSE, UA: NEGATIVE mg/dL
Hgb urine dipstick: NEGATIVE
Ketones, ur: NEGATIVE mg/dL
LEUKOCYTES UA: NEGATIVE
Nitrite: NEGATIVE
PH: 5 (ref 5.0–8.0)
PROTEIN: NEGATIVE mg/dL
SPECIFIC GRAVITY, URINE: 1.01 (ref 1.005–1.030)

## 2016-10-17 LAB — BASIC METABOLIC PANEL
ANION GAP: 11 (ref 5–15)
BUN: 50 mg/dL — AB (ref 6–20)
CALCIUM: 9.4 mg/dL (ref 8.9–10.3)
CO2: 24 mmol/L (ref 22–32)
Chloride: 102 mmol/L (ref 101–111)
Creatinine, Ser: 1.45 mg/dL — ABNORMAL HIGH (ref 0.44–1.00)
GFR calc Af Amer: 36 mL/min — ABNORMAL LOW (ref >60)
GFR, EST NON AFRICAN AMERICAN: 31 mL/min — AB (ref >60)
Glucose, Bld: 166 mg/dL — ABNORMAL HIGH (ref 65–99)
POTASSIUM: 4.6 mmol/L (ref 3.5–5.1)
SODIUM: 137 mmol/L (ref 135–145)

## 2016-10-17 LAB — CBC
HEMATOCRIT: 36.9 % (ref 36.0–46.0)
HEMOGLOBIN: 11.8 g/dL — AB (ref 12.0–15.0)
MCH: 28.2 pg (ref 26.0–34.0)
MCHC: 32 g/dL (ref 30.0–36.0)
MCV: 88.3 fL (ref 78.0–100.0)
Platelets: 242 10*3/uL (ref 150–400)
RBC: 4.18 MIL/uL (ref 3.87–5.11)
RDW: 15.7 % — AB (ref 11.5–15.5)
WBC: 6.8 10*3/uL (ref 4.0–10.5)

## 2016-10-17 LAB — TROPONIN I
TROPONIN I: 3.17 ng/mL — AB (ref <0.03)
Troponin I: 0.06 ng/mL (ref <0.03)
Troponin I: 0.98 ng/mL (ref <0.03)

## 2016-10-17 LAB — MRSA PCR SCREENING: MRSA by PCR: NEGATIVE

## 2016-10-17 LAB — TSH: TSH: 1.523 u[IU]/mL (ref 0.350–4.500)

## 2016-10-17 LAB — GLUCOSE, CAPILLARY: GLUCOSE-CAPILLARY: 172 mg/dL — AB (ref 65–99)

## 2016-10-17 LAB — BRAIN NATRIURETIC PEPTIDE: B NATRIURETIC PEPTIDE 5: 285.4 pg/mL — AB (ref 0.0–100.0)

## 2016-10-17 LAB — I-STAT TROPONIN, ED: TROPONIN I, POC: 0 ng/mL (ref 0.00–0.08)

## 2016-10-17 LAB — CBG MONITORING, ED: Glucose-Capillary: 123 mg/dL — ABNORMAL HIGH (ref 65–99)

## 2016-10-17 MED ORDER — FUROSEMIDE 10 MG/ML IJ SOLN
40.0000 mg | Freq: Once | INTRAMUSCULAR | Status: AC
  Administered 2016-10-17: 40 mg via INTRAVENOUS
  Filled 2016-10-17: qty 4

## 2016-10-17 MED ORDER — METOPROLOL TARTRATE 100 MG PO TABS
100.0000 mg | ORAL_TABLET | Freq: Two times a day (BID) | ORAL | Status: DC
  Administered 2016-10-17 – 2016-10-20 (×6): 100 mg via ORAL
  Filled 2016-10-17: qty 4
  Filled 2016-10-17 (×6): qty 1

## 2016-10-17 MED ORDER — NITROGLYCERIN 0.4 MG SL SUBL: 0.4000 mg | SUBLINGUAL_TABLET | SUBLINGUAL | Status: DC | PRN

## 2016-10-17 MED ORDER — HEPARIN SODIUM (PORCINE) 5000 UNIT/ML IJ SOLN: 5000.0000 [IU] | Freq: Three times a day (TID) | INTRAMUSCULAR | Status: DC

## 2016-10-17 MED ORDER — GUAIFENESIN ER 600 MG PO TB12: 600.0000 mg | ORAL_TABLET | Freq: Two times a day (BID) | ORAL | Status: DC | PRN

## 2016-10-17 MED ORDER — GABAPENTIN 300 MG PO CAPS
300.0000 mg | ORAL_CAPSULE | Freq: Three times a day (TID) | ORAL | Status: DC
  Administered 2016-10-17 – 2016-10-20 (×9): 300 mg via ORAL
  Filled 2016-10-17 (×9): qty 1

## 2016-10-17 MED ORDER — ESTRADIOL 1 MG PO TABS
0.5000 mg | ORAL_TABLET | Freq: Every evening | ORAL | Status: DC
  Administered 2016-10-18 – 2016-10-19 (×2): 0.5 mg via ORAL
  Filled 2016-10-17 (×3): qty 0.5

## 2016-10-17 MED ORDER — PRAVASTATIN SODIUM 40 MG PO TABS
40.0000 mg | ORAL_TABLET | Freq: Every evening | ORAL | Status: DC
  Administered 2016-10-17 – 2016-10-19 (×3): 40 mg via ORAL
  Filled 2016-10-17 (×3): qty 1

## 2016-10-17 MED ORDER — ACETAMINOPHEN 325 MG PO TABS: 650.0000 mg | ORAL_TABLET | ORAL | Status: DC | PRN

## 2016-10-17 MED ORDER — DILTIAZEM HCL ER BEADS 240 MG PO CP24: 360.0000 mg | ORAL_CAPSULE | Freq: Every day | ORAL | Status: DC

## 2016-10-17 MED ORDER — TRAMADOL HCL 50 MG PO TABS
50.0000 mg | ORAL_TABLET | Freq: Four times a day (QID) | ORAL | Status: DC | PRN
  Administered 2016-10-19: 50 mg via ORAL
  Filled 2016-10-17: qty 1

## 2016-10-17 MED ORDER — DILTIAZEM HCL ER COATED BEADS 180 MG PO CP24
360.0000 mg | ORAL_CAPSULE | Freq: Every day | ORAL | Status: DC
  Administered 2016-10-17 – 2016-10-20 (×4): 360 mg via ORAL
  Filled 2016-10-17 (×4): qty 2

## 2016-10-17 MED ORDER — ALBUTEROL SULFATE (2.5 MG/3ML) 0.083% IN NEBU: 2.5000 mg | INHALATION_SOLUTION | RESPIRATORY_TRACT | Status: DC | PRN

## 2016-10-17 MED ORDER — INSULIN ASPART 100 UNIT/ML ~~LOC~~ SOLN
0.0000 [IU] | Freq: Three times a day (TID) | SUBCUTANEOUS | Status: DC
  Administered 2016-10-17: 2 [IU] via SUBCUTANEOUS
  Administered 2016-10-17 – 2016-10-18 (×2): 1 [IU] via SUBCUTANEOUS
  Administered 2016-10-18: 2 [IU] via SUBCUTANEOUS
  Administered 2016-10-19: 1 [IU] via SUBCUTANEOUS
  Administered 2016-10-19: 2 [IU] via SUBCUTANEOUS
  Administered 2016-10-20: 1 [IU] via SUBCUTANEOUS
  Filled 2016-10-17: qty 1

## 2016-10-17 MED ORDER — HYDROCODONE-ACETAMINOPHEN 5-325 MG PO TABS: 0.5000 | ORAL_TABLET | ORAL | Status: DC | PRN

## 2016-10-17 MED ORDER — ASPIRIN EC 81 MG PO TBEC
81.0000 mg | DELAYED_RELEASE_TABLET | Freq: Every day | ORAL | Status: DC
  Administered 2016-10-18 – 2016-10-20 (×3): 81 mg via ORAL
  Filled 2016-10-17 (×3): qty 1

## 2016-10-17 MED ORDER — MONTELUKAST SODIUM 10 MG PO TABS
10.0000 mg | ORAL_TABLET | Freq: Every day | ORAL | Status: DC
  Administered 2016-10-18 – 2016-10-20 (×3): 10 mg via ORAL
  Filled 2016-10-17 (×3): qty 1

## 2016-10-17 MED ORDER — LEVOTHYROXINE SODIUM 100 MCG PO TABS
100.0000 ug | ORAL_TABLET | ORAL | Status: DC
Start: 2016-10-17 — End: 2016-10-20
  Administered 2016-10-17 – 2016-10-20 (×4): 100 ug via ORAL
  Filled 2016-10-17: qty 1
  Filled 2016-10-17: qty 2
  Filled 2016-10-17 (×3): qty 1

## 2016-10-17 MED ORDER — LEVOTHYROXINE SODIUM 50 MCG PO TABS: 50.0000 ug | ORAL_TABLET | ORAL | Status: DC

## 2016-10-17 MED ORDER — CYCLOSPORINE 0.05 % OP EMUL
1.0000 [drp] | Freq: Two times a day (BID) | OPHTHALMIC | Status: DC
  Administered 2016-10-17 – 2016-10-20 (×6): 1 [drp] via OPHTHALMIC
  Filled 2016-10-17 (×7): qty 1

## 2016-10-17 MED ORDER — LORAZEPAM 0.5 MG PO TABS
0.5000 mg | ORAL_TABLET | ORAL | Status: DC | PRN
  Administered 2016-10-19 – 2016-10-20 (×2): 0.5 mg via ORAL
  Filled 2016-10-17 (×2): qty 1

## 2016-10-17 MED ORDER — PANTOPRAZOLE SODIUM 40 MG PO TBEC
40.0000 mg | DELAYED_RELEASE_TABLET | Freq: Every day | ORAL | Status: DC
  Administered 2016-10-17 – 2016-10-20 (×4): 40 mg via ORAL
  Filled 2016-10-17 (×4): qty 1

## 2016-10-17 MED ORDER — CARBOXYMETHYLCELLULOSE SODIUM 1 % OP SOLN: 1.0000 [drp] | Freq: Two times a day (BID) | OPHTHALMIC | Status: DC

## 2016-10-17 MED ORDER — NITROGLYCERIN 0.4 MG SL SUBL
0.4000 mg | SUBLINGUAL_TABLET | SUBLINGUAL | Status: DC | PRN
  Administered 2016-10-17: 0.4 mg via SUBLINGUAL
  Filled 2016-10-17: qty 1

## 2016-10-17 MED ORDER — LEVOTHYROXINE SODIUM 50 MCG PO TABS: 50.0000 ug | ORAL_TABLET | Freq: Every day | ORAL | Status: DC

## 2016-10-17 MED ORDER — MELOXICAM 7.5 MG PO TABS: 7.5000 mg | ORAL_TABLET | Freq: Every day | ORAL | Status: DC

## 2016-10-17 MED ORDER — POLYVINYL ALCOHOL 1.4 % OP SOLN
1.0000 [drp] | Freq: Two times a day (BID) | OPHTHALMIC | Status: DC
  Administered 2016-10-17 – 2016-10-20 (×6): 1 [drp] via OPHTHALMIC
  Filled 2016-10-17: qty 15

## 2016-10-17 MED ORDER — TORSEMIDE 20 MG PO TABS
20.0000 mg | ORAL_TABLET | Freq: Every day | ORAL | Status: DC
  Administered 2016-10-17 – 2016-10-20 (×4): 20 mg via ORAL
  Filled 2016-10-17 (×4): qty 1

## 2016-10-17 MED ORDER — GI COCKTAIL ~~LOC~~: 30.0000 mL | Freq: Three times a day (TID) | ORAL | Status: DC | PRN

## 2016-10-17 MED ORDER — ONDANSETRON HCL 4 MG/2ML IJ SOLN: 4.0000 mg | Freq: Four times a day (QID) | INTRAMUSCULAR | Status: DC | PRN

## 2016-10-17 NOTE — ED Notes (Addendum)
Primary RN notified, will call admitting MD.  Report received, pt transferred to [facility] to await admission.

## 2016-10-17 NOTE — ED Notes (Signed)
Admitting PA at bedside.

## 2016-10-17 NOTE — ED Triage Notes (Signed)
Pt arrived from home with c/o generalized chest pressure, radiating to BUE. Pt noted to be wheezing in the field per EMS. Pt given 5mg  Albuterol, 325 ASA, and 1 nitro PTA.
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
## 2016-10-17 NOTE — ED Notes (Signed)
Family at bedside. 
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
## 2016-10-17 NOTE — ED Notes (Signed)
Dr. Alcario Drought informed of elevated troponin and pt reported chest pain. This nurse requested reevaluation of bed request. No new orders at this time.

## 2016-10-17 NOTE — ED Notes (Signed)
Awaiting medications to be verified 

## 2016-10-17 NOTE — Progress Notes (Signed)
Auto CPAP 5cmH20(min)-20cmH20(max) set up for pt with FFM. Pt tried out CPAP, thinks she will be able to tolerate thru out the night. Will notify RT when ready to be put on machine for sleep. RT will continue to monitor.

## 2016-10-17 NOTE — ED Notes (Signed)
Pt sitting on side of bed having lunch with visitor.

## 2016-10-18 ENCOUNTER — Observation Stay (HOSPITAL_COMMUNITY): Payer: Medicare Other

## 2016-10-18 ENCOUNTER — Observation Stay (HOSPITAL_BASED_OUTPATIENT_CLINIC_OR_DEPARTMENT_OTHER): Payer: Medicare Other

## 2016-10-18 DIAGNOSIS — I10 Essential (primary) hypertension: Secondary | ICD-10-CM | POA: Diagnosis not present

## 2016-10-18 DIAGNOSIS — I130 Hypertensive heart and chronic kidney disease with heart failure and stage 1 through stage 4 chronic kidney disease, or unspecified chronic kidney disease: Secondary | ICD-10-CM | POA: Diagnosis not present

## 2016-10-18 DIAGNOSIS — N183 Chronic kidney disease, stage 3 (moderate): Secondary | ICD-10-CM | POA: Diagnosis not present

## 2016-10-18 DIAGNOSIS — M7989 Other specified soft tissue disorders: Secondary | ICD-10-CM | POA: Diagnosis not present

## 2016-10-18 DIAGNOSIS — I5033 Acute on chronic diastolic (congestive) heart failure: Secondary | ICD-10-CM

## 2016-10-18 DIAGNOSIS — G4710 Hypersomnia, unspecified: Secondary | ICD-10-CM | POA: Diagnosis not present

## 2016-10-18 DIAGNOSIS — E1122 Type 2 diabetes mellitus with diabetic chronic kidney disease: Secondary | ICD-10-CM | POA: Diagnosis not present

## 2016-10-18 DIAGNOSIS — I2510 Atherosclerotic heart disease of native coronary artery without angina pectoris: Secondary | ICD-10-CM | POA: Diagnosis not present

## 2016-10-18 DIAGNOSIS — G4733 Obstructive sleep apnea (adult) (pediatric): Secondary | ICD-10-CM | POA: Diagnosis not present

## 2016-10-18 DIAGNOSIS — I214 Non-ST elevation (NSTEMI) myocardial infarction: Secondary | ICD-10-CM | POA: Diagnosis not present

## 2016-10-18 DIAGNOSIS — I4892 Unspecified atrial flutter: Secondary | ICD-10-CM | POA: Diagnosis not present

## 2016-10-18 DIAGNOSIS — R079 Chest pain, unspecified: Secondary | ICD-10-CM | POA: Diagnosis not present

## 2016-10-18 DIAGNOSIS — I480 Paroxysmal atrial fibrillation: Secondary | ICD-10-CM | POA: Diagnosis not present

## 2016-10-18 DIAGNOSIS — E785 Hyperlipidemia, unspecified: Secondary | ICD-10-CM | POA: Diagnosis not present

## 2016-10-18 DIAGNOSIS — I482 Chronic atrial fibrillation: Secondary | ICD-10-CM

## 2016-10-18 DIAGNOSIS — R0602 Shortness of breath: Secondary | ICD-10-CM | POA: Diagnosis not present

## 2016-10-18 DIAGNOSIS — K219 Gastro-esophageal reflux disease without esophagitis: Secondary | ICD-10-CM | POA: Diagnosis not present

## 2016-10-18 DIAGNOSIS — I249 Acute ischemic heart disease, unspecified: Secondary | ICD-10-CM

## 2016-10-18 DIAGNOSIS — E039 Hypothyroidism, unspecified: Secondary | ICD-10-CM | POA: Diagnosis not present

## 2016-10-18 LAB — BASIC METABOLIC PANEL
ANION GAP: 10 (ref 5–15)
BUN: 47 mg/dL — ABNORMAL HIGH (ref 6–20)
CALCIUM: 8.9 mg/dL (ref 8.9–10.3)
CO2: 27 mmol/L (ref 22–32)
Chloride: 101 mmol/L (ref 101–111)
Creatinine, Ser: 1.13 mg/dL — ABNORMAL HIGH (ref 0.44–1.00)
GFR calc non Af Amer: 42 mL/min — ABNORMAL LOW (ref >60)
GFR, EST AFRICAN AMERICAN: 49 mL/min — AB (ref >60)
GLUCOSE: 134 mg/dL — AB (ref 65–99)
POTASSIUM: 3.9 mmol/L (ref 3.5–5.1)
Sodium: 138 mmol/L (ref 135–145)

## 2016-10-18 LAB — LIPID PANEL
CHOL/HDL RATIO: 3.4 ratio
Cholesterol: 152 mg/dL (ref 0–200)
Cholesterol: 165 mg/dL (ref 0–200)
HDL: 45 mg/dL (ref >40)
HDL: 48 mg/dL (ref >40)
LDL CALC: 82 mg/dL (ref 0–99)
LDL Cholesterol: 85 mg/dL (ref 0–99)
TRIGLYCERIDES: 110 mg/dL (ref <150)
TRIGLYCERIDES: 176 mg/dL — AB (ref <150)
Total CHOL/HDL Ratio: 3.4 RATIO
VLDL: 22 mg/dL (ref 0–40)
VLDL: 35 mg/dL (ref 0–40)

## 2016-10-18 LAB — ECHOCARDIOGRAM COMPLETE
HEIGHTINCHES: 63 in
Weight: 2112 oz

## 2016-10-18 LAB — CBC
HEMATOCRIT: 36.3 % (ref 36.0–46.0)
Hemoglobin: 11.5 g/dL — ABNORMAL LOW (ref 12.0–15.0)
MCH: 27.6 pg (ref 26.0–34.0)
MCHC: 31.7 g/dL (ref 30.0–36.0)
MCV: 87.3 fL (ref 78.0–100.0)
PLATELETS: 218 10*3/uL (ref 150–400)
RBC: 4.16 MIL/uL (ref 3.87–5.11)
RDW: 15.4 % (ref 11.5–15.5)
WBC: 6.6 10*3/uL (ref 4.0–10.5)

## 2016-10-18 LAB — GLUCOSE, CAPILLARY
GLUCOSE-CAPILLARY: 121 mg/dL — AB (ref 65–99)
Glucose-Capillary: 127 mg/dL — ABNORMAL HIGH (ref 65–99)
Glucose-Capillary: 170 mg/dL — ABNORMAL HIGH (ref 65–99)
Glucose-Capillary: 119 mg/dL — ABNORMAL HIGH (ref 65–99)

## 2016-10-18 LAB — PROTIME-INR
INR: 1.19
Prothrombin Time: 15.1 seconds (ref 11.4–15.2)

## 2016-10-18 MED ORDER — HEPARIN (PORCINE) IN NACL 100-0.45 UNIT/ML-% IJ SOLN
700.0000 [IU]/h | INTRAMUSCULAR | Status: DC
  Administered 2016-10-18 – 2016-10-19 (×2): 700 [IU]/h via INTRAVENOUS
  Filled 2016-10-18 (×2): qty 250

## 2016-10-18 MED ORDER — HEPARIN BOLUS VIA INFUSION
3000.0000 [IU] | Freq: Once | INTRAVENOUS | Status: AC
  Administered 2016-10-18: 3000 [IU] via INTRAVENOUS
  Filled 2016-10-18: qty 3000

## 2016-10-18 NOTE — Progress Notes (Signed)
**  Preliminary report by tech**  Left lower extremity venous duplex complete. There is no evidence of deep or superficial vein thrombosis involving the left lower extremity. All visualized vessels appear patent and compressible. There is no evidence of a Baker's cyst on the left. Unable to visualize the contralateral common femoral artery and saphenofemoral junction due to a hip level above the knee amputation.  10/18/16 3:59 PM Carlos Levering RVT

## 2016-10-18 NOTE — Progress Notes (Signed)
PT Cancellation Note  Patient Details Name: Kelsey Joseph MRN: [id number] DOB: [birth year]   Cancelled Treatment:    Reason Eval/Treat Not Completed: Medical issues which prohibited therapy.   PT to initiate evaluation after doppler results are negative for DVT.  No note for test completed/results.  Will follow at later time for PT evaluation.   Despina Pole 10/18/2016, 1:58 PM Carita Pian. Sanjuana Kava, [place] Pager [phone]
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
## 2016-10-18 NOTE — Progress Notes (Signed)
  Echocardiogram 2D Echocardiogram has been performed.  Cherrelle Plante L Androw 10/18/2016, 2:58 PM

## 2016-10-18 NOTE — Progress Notes (Signed)
PROGRESS NOTE    Kelsey Joseph  ONG:[id number] DOB: [birth year] DOA: 10/17/2016 PCP: [name], MD   Brief Narrative:  83 y.o. WF PMHx HTN, PAF, GERD, h/o SSS, sinus arrest S/P s/p Medtronic PPM 03/2014, OSA, Peripheral neuropathy  Presenting to the ED with sudden onset of constant, moderate, dull substernal chest pain radiating to both upper extremities and neck,  Associated with cough and wheezing since 1 am. She took 2 ASA without significant relief. EMS gave her 1 NTG with some improvement of her pain. Pain notworsened with deep inspiration, movement or exertion. Denies any dizziness or falls. No syncope or presyncope. Denies any fever or chills. Denies any nausea, vomiting or abdominal pain. Appetite is normal and eats salt rich foods Reports new onset of left leg swelling but denies calf pain. Denies any headaches or vision changes. Denies any seizures No confusion reported. She has never had an MI.  No recent long distance trips. Denies any new stressors. No new meds. She is on daily  hormonal therapy. No new herbal supplements. No tobacco,  ETOH or recreational drugs.   Subjective: 7/15  A/O 4, negative CP, negative SOB, negative N/V. Patient states~Wednesday began to have tightness across chest. Friday morning positive CP which woke her at~0230 radiating bilateral shoulders and neck, described as crushing, did not resolve until admitted in the hospital. Currently CP free. Unsure of baseline weight    Assessment & Plan:   Active Problems:   Hypothyroidism   OBSTRUCTIVE SLEEP APNEA   Essential hypertension   Atrial fibrillation (HCC)   GERD   HYPERSOMNIA   Hyperlipidemia   Pacemaker   Amputee, hip   Chronic anticoagulation   Acute on chronic diastolic CHF (congestive heart failure) (HCC)   Chest pain   Chest pain syndrome, cardiac with likely a component of CHF/Acute MI  CP mildly relieved by nitroglycerin  aspirin. HEART score 5-6 . Sat low 90s, now normal with 2 L   Troponin 0.06,  EKG paced without evidence of ACS.  CXR with possible edema . BNP above 250 . Last 2 D echo 2016 EF 60-65, nl syst function and Gr 2 DD  Received IV Lasix  40 mg x1 -Continue to trend troponin Troponin Recent Labs Lab 10/17/16 0507 10/17/16 1034 10/17/16 1554  TROPONINI 0.06* 0.98* 3.17*  -Start heparin drip -Patient seen in passing in the hallway by Dr.Croitoru, Mihai, officially consult in the A.m. cardiac catheterization? -continue ASA, O2 and NTG as needed -Check Lipid panel  -Echocardiogram shows worsening diastolic CHF 2 D echo  -TSH WNL  Acute on Chronic diastolic CHF -Echocardiogram shows worsening of diastolic CHF see results below -Strict I&O -Daily weight Filed Weights   10/17/16 0241 10/17/16 1538 10/18/16 0453  Weight: 130 lb (59 kg) 133 lb (60.3 kg) 132 lb (59.9 kg)  -Cardizem CD 360 mg daily -Metoprolol 100 mg BID  Atrial Fibrillation (CHA2DS2-VASc score 6-7) -Patient currently paced rhythm -Not on anticoagulation secondary to previous GI bleed       -See CHF        Essential HTN       -See CHF  RLE swelling.  OSA -Continue CPAP nightly   Acute on Chronic kidney disease stage 3 (baseline Cr1-1.2) Lab Results  Component Value Date   CREATININE 1.13 (H) 10/18/2016   CREATININE 1.45 (H) 10/17/2016   CREATININE 1.00 10/07/2014  -Close to baseline, continue to monitor     DM type II -A1c pending -Lipid panel pending  HLD -See  DM  Hypothyroidism -Synthroid per home regimen  History of L AKA Will need PT/OT  eval     DVT prophylaxis: Heparin drip Code Status: Full Family Communication: Daughter at bedside Disposition Plan: Await cardiology recommendation   Consultants:      Procedures/Significant Events:  7/15 Echocardiogram:- LVEF= 60% to 65%. - (grade 2 diastolic 5/73 LLE Doppler: Negative DVT/SVT       VENTILATOR SETTINGS: None   Cultures None  Antimicrobials: None   Devices None   LINES /  TUBES:  None    Continuous Infusions:   Objective: Vitals:   10/18/16 1133 10/18/16 1203 10/18/16 1603 10/18/16 1627  BP:  (!) 118/51 114/62   Pulse:  63  84  Resp: 14 10  20   Temp: 97.8 F (36.6 C)   97.9 F (36.6 C)  TempSrc: Oral   Oral  SpO2:  96%  97%  Weight:      Height:        Intake/Output Summary (Last 24 hours) at 10/18/16 1722 Last data filed at 10/18/16 1633  Gross per 24 hour  Intake              720 ml  Output             3150 ml  Net            -2430 ml   Filed Weights   10/17/16 0241 10/17/16 1538 10/18/16 0453  Weight: 130 lb (59 kg) 133 lb (60.3 kg) 132 lb (59.9 kg)    Examination:  General: A/O 4, No acute respiratory distress Eyes: negative scleral hemorrhage, negative anisocoria, negative icterus ENT: Negative Runny nose, negative gingival bleeding, Neck:  Negative scars, masses, torticollis, lymphadenopathy, JVD Lungs: Clear to auscultation bilaterally without wheezes or crackles Cardiovascular: Regular rate and rhythm (paced) without murmur gallop or rub normal S1 and S2 Abdomen: negative abdominal pain, nondistended, positive soft, bowel sounds, no rebound, no ascites, no appreciable mass Extremities: No significant cyanosis, clubbing, or edema bilateral lower extremities Skin: Negative rashes, lesions, ulcers Psychiatric:  Negative depression, negative anxiety, negative fatigue, negative mania  Central nervous system:  Cranial nerves II through XII intact, tongue/uvula midline, all extremities muscle strength 5/5, sensation intact throughout,  negative dysarthria, negative expressive aphasia, negative receptive aphasia.  .     Data Reviewed: Care during the described time interval was provided by me .  I have reviewed this patient's available data, including medical history, events of note, physical examination, and all test results as part of my evaluation. I have personally reviewed and interpreted all radiology  studies.  CBC:  Recent Labs Lab 10/17/16 0241 10/18/16 0334  WBC 6.8 6.6  HGB 11.8* 11.5*  HCT 36.9 36.3  MCV 88.3 87.3  PLT 242 220   Basic Metabolic Panel:  Recent Labs Lab 10/17/16 0241 10/18/16 0334  NA 137 138  K 4.6 3.9  CL 102 101  CO2 24 27  GLUCOSE 166* 134*  BUN 50* 47*  CREATININE 1.45* 1.13*  CALCIUM 9.4 8.9   GFR: Estimated Creatinine Clearance: 29 mL/min (A) (by C-G formula based on SCr of 1.13 mg/dL (H)). Liver Function Tests: No results for input(s): AST, ALT, ALKPHOS, BILITOT, PROT, ALBUMIN in the last 168 hours. No results for input(s): LIPASE, AMYLASE in the last 168 hours. No results for input(s): AMMONIA in the last 168 hours. Coagulation Profile:  Recent Labs Lab 10/18/16 0334  INR 1.19   Cardiac Enzymes:  Recent Labs Lab 10/17/16 0507  10/17/16 1034 10/17/16 1554  TROPONINI 0.06* 0.98* 3.17*   BNP (last 3 results) No results for input(s): PROBNP in the last 8760 hours. HbA1C: No results for input(s): HGBA1C in the last 72 hours. CBG:  Recent Labs Lab 10/17/16 1330 10/17/16 1556 10/18/16 0812 10/18/16 1134 10/18/16 1629  GLUCAP 123* 172* 127* 121* 170*   Lipid Profile:  Recent Labs  10/18/16 0334  CHOL 152  HDL 45  LDLCALC 85  TRIG 110  CHOLHDL 3.4   Thyroid Function Tests:  Recent Labs  10/17/16 0738  TSH 1.523   Anemia Panel: No results for input(s): VITAMINB12, FOLATE, FERRITIN, TIBC, IRON, RETICCTPCT in the last 72 hours. Urine analysis:    Component Value Date/Time   COLORURINE YELLOW 10/17/2016 1628   APPEARANCEUR CLEAR 10/17/2016 1628   LABSPEC 1.010 10/17/2016 1628   PHURINE 5.0 10/17/2016 1628   GLUCOSEU NEGATIVE 10/17/2016 1628   HGBUR NEGATIVE 10/17/2016 1628   HGBUR negative 12/21/2008 1209   BILIRUBINUR NEGATIVE 10/17/2016 1628   BILIRUBINUR n 12/11/2010 1100   KETONESUR NEGATIVE 10/17/2016 1628   PROTEINUR NEGATIVE 10/17/2016 1628   UROBILINOGEN 0.2 07/02/2013 0030   NITRITE  NEGATIVE 10/17/2016 1628   LEUKOCYTESUR NEGATIVE 10/17/2016 1628   Sepsis Labs: @LABRCNTIP (procalcitonin:4,lacticidven:4)  ) Recent Results (from the past 240 hour(s))  MRSA PCR Screening     Status: None   Collection Time: 10/17/16  4:10 PM  Result Value Ref Range Status   MRSA by PCR NEGATIVE NEGATIVE Final    Comment:        The GeneXpert MRSA Assay (FDA approved for NASAL specimens only), is one component of a comprehensive MRSA colonization surveillance program. It is not intended to diagnose MRSA infection nor to guide or monitor treatment for MRSA infections.          Radiology Studies: X-ray Chest Pa And Lateral  Result Date: 10/18/2016 CLINICAL DATA:  Patient admitted 10/17/2016 with shortness of breath and chest pain. EXAM: CHEST  2 VIEW COMPARISON:  PA and lateral chest 10/17/2016. FINDINGS: Pulmonary edema seen on the comparison examination has resolved. Lungs are clear. Heart size is normal. No pneumothorax or pleural effusion. Pacing device is noted. IMPRESSION: Resolved pulmonary edema.  No acute disease. Electronically Signed   By: Inge Rise M.D.   On: 10/18/2016 10:36   Dg Chest 2 View  Result Date: 10/17/2016 CLINICAL DATA:  83 year old female with chest pain under wheezing. EXAM: CHEST  2 VIEW COMPARISON:  Chest radiograph dated 10/03/2014 FINDINGS: There is stable mild cardiomegaly with bilateral perihilar and interstitial prominence most consistent with edema. Superimposed pneumonia is not excluded. Clinical correlation is recommended. There is no pleural effusion or pneumothorax. Left pectoral pacemaker device noted. No acute osseous pathology. IMPRESSION: Bilateral airspace and interstitial densities likely edema. Pneumonia is not excluded. Clinical correlation is recommended. Electronically Signed   By: Anner Crete M.D.   On: 10/17/2016 03:38        Scheduled Meds: . aspirin EC  81 mg Oral Daily  . cycloSPORINE  1 drop Both Eyes BID  .  diltiazem  360 mg Oral Daily  . estradiol  0.5 mg Oral QPM  . gabapentin  300 mg Oral TID  . insulin aspart  0-9 Units Subcutaneous TID WC  . levothyroxine  100 mcg Oral Once per day on Sun Mon Tue Thu Fri Sat  . [START ON 10/21/2016] levothyroxine  50 mcg Oral Once per day on Wed  . metoprolol tartrate  100 mg Oral BID  .  montelukast  10 mg Oral Daily  . pantoprazole  40 mg Oral Daily  . polyvinyl alcohol  1 drop Both Eyes BID  . pravastatin  40 mg Oral QPM  . torsemide  20 mg Oral Daily   Continuous Infusions:   LOS: 0 days    Time spent: 40 minutes    WOODS, Geraldo Docker, MD [facility] Pager [phone]   If 7PM-7AM, please contact night-coverage [email or web address] Password [email or web address] 10/18/2016, 5:22 PM

## 2016-10-18 NOTE — Plan of Care (Signed)
Problem: Safety: Goal: Ability to remain free from injury will improve Outcome: Progressing No incidence of falls during this admission. Call bell within reach. Bed in low and locked position. Patient alert and oriented. Clean and clear environment maintained. 3/4 siderails in place. Nonskid footwear being utilized. Patient verbalized understanding of safety instruction.  Problem: Education: Goal: Understanding of cardiac disease, CV risk reduction, and recovery process will improve Outcome: Progressing Patient understands disease process and associated risk factors. States she is looking forward to going home. SOB has improved.

## 2016-10-19 ENCOUNTER — Observation Stay (HOSPITAL_COMMUNITY): Payer: Medicare Other

## 2016-10-19 ENCOUNTER — Encounter (HOSPITAL_COMMUNITY): Payer: Self-pay | Admitting: Cardiology

## 2016-10-19 DIAGNOSIS — E038 Other specified hypothyroidism: Secondary | ICD-10-CM | POA: Diagnosis not present

## 2016-10-19 DIAGNOSIS — I482 Chronic atrial fibrillation: Secondary | ICD-10-CM | POA: Diagnosis not present

## 2016-10-19 DIAGNOSIS — E785 Hyperlipidemia, unspecified: Secondary | ICD-10-CM | POA: Diagnosis not present

## 2016-10-19 DIAGNOSIS — E039 Hypothyroidism, unspecified: Secondary | ICD-10-CM | POA: Diagnosis not present

## 2016-10-19 DIAGNOSIS — Z950 Presence of cardiac pacemaker: Secondary | ICD-10-CM

## 2016-10-19 DIAGNOSIS — Z89611 Acquired absence of right leg above knee: Secondary | ICD-10-CM | POA: Diagnosis not present

## 2016-10-19 DIAGNOSIS — I214 Non-ST elevation (NSTEMI) myocardial infarction: Principal | ICD-10-CM

## 2016-10-19 DIAGNOSIS — E1142 Type 2 diabetes mellitus with diabetic polyneuropathy: Secondary | ICD-10-CM | POA: Diagnosis present

## 2016-10-19 DIAGNOSIS — K219 Gastro-esophageal reflux disease without esophagitis: Secondary | ICD-10-CM | POA: Diagnosis not present

## 2016-10-19 DIAGNOSIS — I130 Hypertensive heart and chronic kidney disease with heart failure and stage 1 through stage 4 chronic kidney disease, or unspecified chronic kidney disease: Secondary | ICD-10-CM | POA: Diagnosis not present

## 2016-10-19 DIAGNOSIS — G4733 Obstructive sleep apnea (adult) (pediatric): Secondary | ICD-10-CM | POA: Diagnosis not present

## 2016-10-19 DIAGNOSIS — I480 Paroxysmal atrial fibrillation: Secondary | ICD-10-CM | POA: Diagnosis not present

## 2016-10-19 DIAGNOSIS — Z87891 Personal history of nicotine dependence: Secondary | ICD-10-CM | POA: Diagnosis not present

## 2016-10-19 DIAGNOSIS — Z79899 Other long term (current) drug therapy: Secondary | ICD-10-CM | POA: Diagnosis not present

## 2016-10-19 DIAGNOSIS — I252 Old myocardial infarction: Secondary | ICD-10-CM | POA: Diagnosis not present

## 2016-10-19 DIAGNOSIS — E7800 Pure hypercholesterolemia, unspecified: Secondary | ICD-10-CM | POA: Diagnosis not present

## 2016-10-19 DIAGNOSIS — G4710 Hypersomnia, unspecified: Secondary | ICD-10-CM | POA: Diagnosis not present

## 2016-10-19 DIAGNOSIS — N172 Acute kidney failure with medullary necrosis: Secondary | ICD-10-CM | POA: Diagnosis not present

## 2016-10-19 DIAGNOSIS — I4892 Unspecified atrial flutter: Secondary | ICD-10-CM | POA: Diagnosis not present

## 2016-10-19 DIAGNOSIS — Z7901 Long term (current) use of anticoagulants: Secondary | ICD-10-CM

## 2016-10-19 DIAGNOSIS — R079 Chest pain, unspecified: Secondary | ICD-10-CM | POA: Diagnosis not present

## 2016-10-19 DIAGNOSIS — Z89621 Acquired absence of right hip joint: Secondary | ICD-10-CM | POA: Diagnosis not present

## 2016-10-19 DIAGNOSIS — I10 Essential (primary) hypertension: Secondary | ICD-10-CM | POA: Diagnosis not present

## 2016-10-19 DIAGNOSIS — E1122 Type 2 diabetes mellitus with diabetic chronic kidney disease: Secondary | ICD-10-CM | POA: Diagnosis not present

## 2016-10-19 DIAGNOSIS — E784 Other hyperlipidemia: Secondary | ICD-10-CM | POA: Diagnosis not present

## 2016-10-19 DIAGNOSIS — N183 Chronic kidney disease, stage 3 (moderate): Secondary | ICD-10-CM | POA: Diagnosis not present

## 2016-10-19 DIAGNOSIS — I5033 Acute on chronic diastolic (congestive) heart failure: Secondary | ICD-10-CM | POA: Diagnosis not present

## 2016-10-19 DIAGNOSIS — Z89612 Acquired absence of left leg above knee: Secondary | ICD-10-CM | POA: Diagnosis not present

## 2016-10-19 DIAGNOSIS — R0603 Acute respiratory distress: Secondary | ICD-10-CM | POA: Diagnosis present

## 2016-10-19 LAB — VAS US LOWER EXTREMITY VENOUS (DVT)
EERAT: 17.15
EWDT: 229 ms
FS: 28 % (ref 28–44)
IV/PV OW: 1.05
LA ID, A-P, ES: 38 mm
LA diam end sys: 38 mm
LA diam index: 2.32 cm/m2
LA vol A4C: 80.9 ml
LA vol index: 48.6 mL/m2
LAVOL: 79.6 mL
LV E/e'average: 17.15
LV TDI E'LATERAL: 6.94
LV e' LATERAL: 6.94 cm/s
LVEEMED: 17.15
LVOT SV: 55 mL
LVOT VTI: 24.3 cm
LVOT area: 2.27 cm2
LVOT diameter: 17 mm
LVOTPV: 98.3 cm/s
MV Dec: 229
MV pk A vel: 45.1 m/s
MVPG: 6 mmHg
MVPKEVEL: 119 m/s
PW: 9.25 mm — AB (ref 0.6–1.1)
RV LATERAL S' VELOCITY: 8.11 cm/s
RV TAPSE: 23.9 mm
TDI e' medial: 5.29

## 2016-10-19 LAB — CBC
HCT: 36.4 % (ref 36.0–46.0)
HEMOGLOBIN: 11.7 g/dL — AB (ref 12.0–15.0)
MCH: 28.2 pg (ref 26.0–34.0)
MCHC: 32.1 g/dL (ref 30.0–36.0)
MCV: 87.7 fL (ref 78.0–100.0)
PLATELETS: 224 10*3/uL (ref 150–400)
RBC: 4.15 MIL/uL (ref 3.87–5.11)
RDW: 15.5 % (ref 11.5–15.5)
WBC: 6.8 10*3/uL (ref 4.0–10.5)

## 2016-10-19 LAB — NM MYOCAR MULTI W/SPECT W/WALL MOTION / EF
CHL CUP RESTING HR STRESS: 65 {beats}/min
CSEPED: 5 min
CSEPPHR: 73 {beats}/min
Exercise duration (sec): 16 s

## 2016-10-19 LAB — GLUCOSE, CAPILLARY
GLUCOSE-CAPILLARY: 128 mg/dL — AB (ref 65–99)
GLUCOSE-CAPILLARY: 110 mg/dL — AB (ref 65–99)
Glucose-Capillary: 169 mg/dL — ABNORMAL HIGH (ref 65–99)
Glucose-Capillary: 177 mg/dL — ABNORMAL HIGH (ref 65–99)

## 2016-10-19 LAB — BASIC METABOLIC PANEL
ANION GAP: 9 (ref 5–15)
BUN: 42 mg/dL — AB (ref 6–20)
CHLORIDE: 101 mmol/L (ref 101–111)
CO2: 28 mmol/L (ref 22–32)
Calcium: 8.8 mg/dL — ABNORMAL LOW (ref 8.9–10.3)
Creatinine, Ser: 1.19 mg/dL — ABNORMAL HIGH (ref 0.44–1.00)
GFR calc non Af Amer: 40 mL/min — ABNORMAL LOW (ref >60)
GFR, EST AFRICAN AMERICAN: 46 mL/min — AB (ref >60)
Glucose, Bld: 125 mg/dL — ABNORMAL HIGH (ref 65–99)
POTASSIUM: 4 mmol/L (ref 3.5–5.1)
SODIUM: 138 mmol/L (ref 135–145)

## 2016-10-19 LAB — HEPARIN LEVEL (UNFRACTIONATED)
HEPARIN UNFRACTIONATED: 0.49 [IU]/mL (ref 0.30–0.70)
HEPARIN UNFRACTIONATED: 0.43 [IU]/mL (ref 0.30–0.70)

## 2016-10-19 LAB — MAGNESIUM: MAGNESIUM: 2.1 mg/dL (ref 1.7–2.4)

## 2016-10-19 LAB — TROPONIN I: Troponin I: 0.71 ng/mL (ref <0.03)

## 2016-10-19 MED ORDER — TECHNETIUM TC 99M TETROFOSMIN IV KIT
10.0000 | PACK | Freq: Once | INTRAVENOUS | Status: AC | PRN
  Administered 2016-10-19: 10 via INTRAVENOUS

## 2016-10-19 MED ORDER — ISOSORBIDE MONONITRATE ER 30 MG PO TB24
30.0000 mg | ORAL_TABLET | Freq: Every day | ORAL | Status: DC
  Administered 2016-10-19 – 2016-10-20 (×2): 30 mg via ORAL
  Filled 2016-10-19 (×2): qty 1

## 2016-10-19 MED ORDER — TECHNETIUM TC 99M TETROFOSMIN IV KIT
30.0000 | PACK | Freq: Once | INTRAVENOUS | Status: AC | PRN
  Administered 2016-10-19: 30 via INTRAVENOUS

## 2016-10-19 MED ORDER — REGADENOSON 0.4 MG/5ML IV SOLN
0.4000 mg | Freq: Once | INTRAVENOUS | Status: AC
  Administered 2016-10-19: 0.4 mg via INTRAVENOUS
  Filled 2016-10-19: qty 5

## 2016-10-19 MED ORDER — REGADENOSON 0.4 MG/5ML IV SOLN
INTRAVENOUS | Status: AC
  Filled 2016-10-19: qty 5

## 2016-10-19 NOTE — Progress Notes (Signed)
The nuclear study shows a mostly fixed and moderate size inferior defect. The risk of revascularization appears greater than the benefit, as long as we can control symptoms with medical therapy. Add nitrates (already on beta blocker and calcium channel blocker) and reassess in AM Sanda Klein, MD, [facility] [phone] office [phone] pager ]

## 2016-10-19 NOTE — Progress Notes (Signed)
PT Cancellation Note  Patient Details Name: Zyana E Detienne MRN: [id number] DOB: 1927-12-06   Cancelled Treatment:    Reason Eval/Treat Not Completed: Medical issues which prohibited therapy Pt with elevating troponin levels. Dopplers negative. Awaiting cardiology evaluation and clearance. Will follow up as appropriate.    Marguarite Arbour A Diksha Tagliaferro 10/19/2016, 9:01 AM Wray Kearns, PT, DPT [phone]

## 2016-10-19 NOTE — Progress Notes (Signed)
PROGRESS NOTE    Kelsey Joseph  GUR:[id number] DOB: October 23, 1927 DOA: 10/17/2016 PCP: [name], MD   Brief Narrative:  83 y.o. WF PMHx HTN, PAF, GERD, h/o SSS, sinus arrest S/P s/p Medtr[employer] PPM 03/2014, OSA, Peripheral neuropathy  Presenting to the ED with sudden onset of constant, moderate, dull substernal chest pain radiating to both upper extremities and neck,  Associated with cough and wheezing since 1 am. She took 2 ASA without significant relief. EMS gave her 1 NTG with some improvement of her pain. Pain notworsened with deep inspiration, movement or exertion. Denies any dizziness or falls. No syncope or presyncope. Denies any fever or chills. Denies any nausea, vomiting or abdominal pain. Appetite is normal and eats salt rich foods Reports new onset of left leg swelling but denies calf pain. Denies any headaches or vision changes. Denies any seizures No confusion reported. She has never had an MI.  No recent long distance trips. Denies any new stressors. No new meds. She is on daily  hormonal therapy. No new herbal supplements. No tobacco,  ETOH or recreational drugs.   Subjective: 7/16   A/O 4, negative CP, negative SOB, negative N/V. Unsure of baseline weight    Assessment & Plan:   Active Problems:   Hypothyroidism   OBSTRUCTIVE SLEEP APNEA   Essential hypertension   Atrial fibrillation (HCC)   GERD   HYPERSOMNIA   Hyperlipidemia   Pacemaker   Amputee, hip   Chronic anticoagulation   Acute on chronic diastolic CHF (congestive heart failure) (HCC)   Chest pain   Chest pain syndrome, cardiac with likely a component of CHF/Acute NSTEMI  CP mildly relieved by nitroglycerin  aspirin. HEART score 5-6 . Sat low 90s, now normal with 2 L  Troponin 0.06,  EKG paced without evidence of ACS.  CXR with possible edema . BNP above 250 . Last 2 D echo 2016 EF 60-65, nl syst function and Gr 2 DD  Received IV Lasix  40 mg x1 -Troponin improving Recent Labs Lab 10/17/16 0507  10/17/16 1034 10/17/16 1554 10/19/16 0831  TROPONINI 0.06* 0.98* 3.17* 0.71*  -Heparin drip -continue ASA, O2 and NTG as needed -Lipid panel pending -Echocardiogram shows worsening diastolic CHF 2 D echo  -TSH WNL  Acute on Chronic diastolic CHF -Echocardiogram shows worsening of diastolic CHF see results below -Strict I&O since admission -4.9 L -Daily weight Filed Weights   10/17/16 1538 10/18/16 0453 10/19/16 0500  Weight: 133 lb (60.3 kg) 132 lb (59.9 kg) 135 lb 14.4 oz (61.6 kg)  -Cardizem CD 360 mg daily -Metoprolol 100 mg BID  Atrial Fibrillation (CHA2DS2-VASc score 6-7) -Patient currently paced rhythm -Not on Chronic anticoagulation secondary to previous GI bleed       -See CHF        Essential HTN       -See CHF  LLE swelling.  OSA -Continue CPAP nightly   Acute on Chronic kidney disease stage 3 (baseline Cr1-1.2) Lab Results  Component Value Date   CREATININE 1.19 (H) 10/19/2016   CREATININE 1.13 (H) 10/18/2016   CREATININE 1.45 (H) 10/17/2016  -Baseline      DM type II -A1c pending  HLD -See DM  Hypothyroidism -Synthroid per home regimen  History of L AKA Will need PT/OT  eval after cardiac catheterization?    DVT prophylaxis: Heparin drip Code Status: Full Family Communication: Daughter at bedside Disposition Plan: Await cardiology recommendation   Consultants:  Cardiology    Procedures/Significant Events:  7/15  Echocardiogram:- LVEF= 60% to 65%. - (grade 2 diastolic 2/77 LLE Doppler: Negative DVT/SVT 7/16 stress test results pending       VENTILATOR SETTINGS: None   Cultures None  Antimicrobials: None   Devices None   LINES / TUBES:  None    Continuous Infusions: . heparin 700 Units/hr (10/18/16 2144)     Objective: Vitals:   10/18/16 2356 10/19/16 0500 10/19/16 0745 10/19/16 0750  BP: 108/65 103/65 (!) 82/66 (!) 107/49  Pulse: 97 61 69   Resp: 17 13 17    Temp: 97.9 F (36.6 C) 97.9 F (36.6 C)  97.6 F (36.4 C)   TempSrc: Oral Axillary Axillary   SpO2: 94% 98% 99%   Weight:  135 lb 14.4 oz (61.6 kg)    Height:        Intake/Output Summary (Last 24 hours) at 10/19/16 0816 Last data filed at 10/19/16 0503  Gross per 24 hour  Intake             1080 ml  Output             1950 ml  Net             -870 ml   Filed Weights   10/17/16 1538 10/18/16 0453 10/19/16 0500  Weight: 133 lb (60.3 kg) 132 lb (59.9 kg) 135 lb 14.4 oz (61.6 kg)    Examination:  General: A/O 4, No acute respiratory distress Eyes: negative scleral hemorrhage, negative anisocoria, negative icterus ENT: Negative Runny nose, negative gingival bleeding, Neck:  Negative scars, masses, torticollis, lymphadenopathy, JVD Lungs: Clear to auscultation bilaterally without wheezes or crackles Cardiovascular: Regular rate and rhythm (paced) without murmur gallop or rub normal S1 and S2 Abdomen: negative abdominal pain, nondistended, positive soft, bowel sounds, no rebound, no ascites, no appreciable mass Extremities: No significant cyanosis, clubbing, or edema bilateral lower extremities Skin: Negative rashes, lesions, ulcers Psychiatric:  Negative depression, negative anxiety, negative fatigue, negative mania  Central nervous system:  Cranial nerves II through XII intact, tongue/uvula midline, all extremities muscle strength 5/5, sensation intact throughout,  negative dysarthria, negative expressive aphasia, negative receptive aphasia.  .     Data Reviewed: Care during the described time interval was provided by me .  I have reviewed this patient's available data, including medical history, events of note, physical examination, and all test results as part of my evaluation. I have personally reviewed and interpreted all radiology studies.  CBC:  Recent Labs Lab 10/17/16 0241 10/18/16 0334 10/19/16 0512  WBC 6.8 6.6 6.8  HGB 11.8* 11.5* 11.7*  HCT 36.9 36.3 36.4  MCV 88.3 87.3 87.7  PLT 242 218 412    Basic Metabolic Panel:  Recent Labs Lab 10/17/16 0241 10/18/16 0334 10/19/16 0512  NA 137 138 138  K 4.6 3.9 4.0  CL 102 101 101  CO2 24 27 28   GLUCOSE 166* 134* 125*  BUN 50* 47* 42*  CREATININE 1.45* 1.13* 1.19*  CALCIUM 9.4 8.9 8.8*  MG  --   --  2.1   GFR: Estimated Creatinine Clearance: 27.6 mL/min (A) (by C-G formula based on SCr of 1.19 mg/dL (H)). Liver Function Tests: No results for input(s): AST, ALT, ALKPHOS, BILITOT, PROT, ALBUMIN in the last 168 hours. No results for input(s): LIPASE, AMYLASE in the last 168 hours. No results for input(s): AMMONIA in the last 168 hours. Coagulation Profile:  Recent Labs Lab 10/18/16 0334  INR 1.19   Cardiac Enzymes:  Recent Labs Lab 10/17/16 0507  10/17/16 1034 10/17/16 1554  TROPONINI 0.06* 0.98* 3.17*   BNP (last 3 results) No results for input(s): PROBNP in the last 8760 hours. HbA1C: No results for input(s): HGBA1C in the last 72 hours. CBG:  Recent Labs Lab 10/18/16 0812 10/18/16 1134 10/18/16 1629 10/18/16 2156 10/19/16 0744  GLUCAP 127* 121* 170* 119* 128*   Lipid Profile:  Recent Labs  10/18/16 0334 10/18/16 2046  CHOL 152 165  HDL 45 48  LDLCALC 85 82  TRIG 110 176*  CHOLHDL 3.4 3.4   Thyroid Function Tests:  Recent Labs  10/17/16 0738  TSH 1.523   Anemia Panel: No results for input(s): VITAMINB12, FOLATE, FERRITIN, TIBC, IRON, RETICCTPCT in the last 72 hours. Urine analysis:    Component Value Date/Time   COLORURINE YELLOW 10/17/2016 1628   APPEARANCEUR CLEAR 10/17/2016 1628   LABSPEC 1.010 10/17/2016 1628   PHURINE 5.0 10/17/2016 1628   GLUCOSEU NEGATIVE 10/17/2016 1628   HGBUR NEGATIVE 10/17/2016 1628   HGBUR negative 12/21/2008 1209   BILIRUBINUR NEGATIVE 10/17/2016 1628   BILIRUBINUR n 12/11/2010 1100   KETONESUR NEGATIVE 10/17/2016 1628   PROTEINUR NEGATIVE 10/17/2016 1628   UROBILINOGEN 0.2 07/02/2013 0030   NITRITE NEGATIVE 10/17/2016 1628   LEUKOCYTESUR  NEGATIVE 10/17/2016 1628   Sepsis Labs: @LABRCNTIP (procalcitonin:4,lacticidven:4)  ) Recent Results (from the past 240 hour(s))  MRSA PCR Screening     Status: None   Collection Time: 10/17/16  4:10 PM  Result Value Ref Range Status   MRSA by PCR NEGATIVE NEGATIVE Final    Comment:        The GeneXpert MRSA Assay (FDA approved for NASAL specimens only), is one component of a comprehensive MRSA colonization surveillance program. It is not intended to diagnose MRSA infection nor to guide or monitor treatment for MRSA infections.          Radiology Studies: X-ray Chest Pa And Lateral  Result Date: 10/18/2016 CLINICAL DATA:  Patient admitted 10/17/2016 with shortness of breath and chest pain. EXAM: CHEST  2 VIEW COMPARISON:  PA and lateral chest 10/17/2016. FINDINGS: Pulmonary edema seen on the comparison examination has resolved. Lungs are clear. Heart size is normal. No pneumothorax or pleural effusion. Pacing device is noted. IMPRESSION: Resolved pulmonary edema.  No acute disease. Electronically Signed   By: Inge Rise M.D.   On: 10/18/2016 10:36        Scheduled Meds: . aspirin EC  81 mg Oral Daily  . cycloSPORINE  1 drop Both Eyes BID  . diltiazem  360 mg Oral Daily  . estradiol  0.5 mg Oral QPM  . gabapentin  300 mg Oral TID  . insulin aspart  0-9 Units Subcutaneous TID WC  . levothyroxine  100 mcg Oral Once per day on Sun Mon Tue Thu Fri Sat  . [START ON 10/21/2016] levothyroxine  50 mcg Oral Once per day on Wed  . metoprolol tartrate  100 mg Oral BID  . montelukast  10 mg Oral Daily  . pantoprazole  40 mg Oral Daily  . polyvinyl alcohol  1 drop Both Eyes BID  . pravastatin  40 mg Oral QPM  . torsemide  20 mg Oral Daily   Continuous Infusions: . heparin 700 Units/hr (10/18/16 2144)     LOS: 0 days    Time spent: 40 minutes    Emanuele Mcwhirter, Geraldo Docker, MD [facility] Pager [phone]   If 7PM-7AM, please contact  night-coverage [email or web address] Password [email or web address] 10/19/2016, 8:16 AM

## 2016-10-19 NOTE — Progress Notes (Signed)
    Patient presented for Lexiscan nuclear stress test. Tolerated procedure well. Pending final stress imaging result.  Daune Perch, AGNP-C 10/19/2016  2:40 PM Pager: [phone]
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
## 2016-10-19 NOTE — Progress Notes (Signed)
ANTICOAGULATION CONSULT NOTE - Follow-up Consult  Pharmacy Consult for Heparin  Indication: chest pain/ACS/STEMI  Allergies  Allergen Reactions  . Latex Other (See Comments)    Patient states only "latex bandages" causes blisters  . Sulfa Antibiotics Diarrhea    Severe diarrhea  . Adhesive [Tape] Other (See Comments)    Blisters, when left on for "a while."    Patient Measurements: Height: 5\' 3"  (160 cm) Weight: 135 lb 14.4 oz (61.6 kg) IBW/kg (Calculated) : 52.4 Heparin Dosing Weight: 59.9 kg  Vital Signs: Temp: 98.1 F (36.7 C) (07/16 1100) Temp Source: Oral (07/16 1100) BP: 121/62 (07/16 1431) Pulse Rate: 68 (07/16 1432)  Labs:  Recent Labs  10/17/16 0241  10/17/16 1034 10/17/16 1554 10/18/16 0334 10/19/16 0512 10/19/16 0831 10/19/16 1605  HGB 11.8*  --   --   --  11.5* 11.7*  --   --   HCT 36.9  --   --   --  36.3 36.4  --   --   PLT 242  --   --   --  218 224  --   --   LABPROT  --   --   --   --  15.1  --   --   --   INR  --   --   --   --  1.19  --   --   --   HEPARINUNFRC  --   --   --   --   --  0.49  --  0.43  CREATININE 1.45*  --   --   --  1.13* 1.19*  --   --   TROPONINI  --   < > 0.98* 3.17*  --   --  0.71*  --   < > = values in this interval not displayed.  Estimated Creatinine Clearance: 27.6 mL/min (A) (by C-G formula based on SCr of 1.19 mg/dL (H)).    Assessment: 83 y.o female admitted on 10/17/16  with chest pain on heparin. Heparin level is confirmed at goal on 700 units/hr. Stress test results pending  Goal of Therapy:  Heparin level 0.3-0.7 units/ml Monitor platelets by anticoagulation protocol: Yes   Plan:   Continue heparin gtt at 700 units/hr Daily heparin level and CBC   Hildred Laser, Pharm D 10/19/2016 5:38 PM

## 2016-10-19 NOTE — Progress Notes (Signed)
OT Cancellation Note  Patient Details Name: Kelsey Joseph MRN: [id number] DOB: 17-Dec-1927   Cancelled Treatment:    Reason Eval/Treat Not Completed: Medical issues which prohibited therapy. Pt with elevated troponins, cardiology involved. Will await medical clearance priot to proceeding with evaluation.  [name] 10/19/2016, 12:59 PM  [phone]
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
## 2016-10-20 ENCOUNTER — Telehealth: Payer: Self-pay | Admitting: Cardiovascular Disease

## 2016-10-20 DIAGNOSIS — N183 Chronic kidney disease, stage 3 unspecified: Secondary | ICD-10-CM

## 2016-10-20 DIAGNOSIS — N172 Acute kidney failure with medullary necrosis: Secondary | ICD-10-CM

## 2016-10-20 DIAGNOSIS — E038 Other specified hypothyroidism: Secondary | ICD-10-CM

## 2016-10-20 DIAGNOSIS — E784 Other hyperlipidemia: Secondary | ICD-10-CM

## 2016-10-20 DIAGNOSIS — Z89621 Acquired absence of right hip joint: Secondary | ICD-10-CM

## 2016-10-20 DIAGNOSIS — I480 Paroxysmal atrial fibrillation: Secondary | ICD-10-CM

## 2016-10-20 LAB — BASIC METABOLIC PANEL
Anion gap: 10 (ref 5–15)
BUN: 41 mg/dL — ABNORMAL HIGH (ref 6–20)
CHLORIDE: 100 mmol/L — AB (ref 101–111)
CO2: 24 mmol/L (ref 22–32)
CREATININE: 1.07 mg/dL — AB (ref 0.44–1.00)
Calcium: 8.6 mg/dL — ABNORMAL LOW (ref 8.9–10.3)
GFR calc non Af Amer: 45 mL/min — ABNORMAL LOW (ref >60)
GFR, EST AFRICAN AMERICAN: 53 mL/min — AB (ref >60)
Glucose, Bld: 126 mg/dL — ABNORMAL HIGH (ref 65–99)
POTASSIUM: 3.8 mmol/L (ref 3.5–5.1)
SODIUM: 134 mmol/L — AB (ref 135–145)

## 2016-10-20 LAB — HEMOGLOBIN A1C
Hgb A1c MFr Bld: 6.5 % — ABNORMAL HIGH (ref 4.8–5.6)
MEAN PLASMA GLUCOSE: 140 mg/dL

## 2016-10-20 LAB — GLUCOSE, CAPILLARY
GLUCOSE-CAPILLARY: 128 mg/dL — AB (ref 65–99)
GLUCOSE-CAPILLARY: 117 mg/dL — AB (ref 65–99)

## 2016-10-20 LAB — HEPARIN LEVEL (UNFRACTIONATED): HEPARIN UNFRACTIONATED: 0.38 [IU]/mL (ref 0.30–0.70)

## 2016-10-20 LAB — CBC
HCT: 33 % — ABNORMAL LOW (ref 36.0–46.0)
HEMOGLOBIN: 10.5 g/dL — AB (ref 12.0–15.0)
MCH: 27.9 pg (ref 26.0–34.0)
MCHC: 31.8 g/dL (ref 30.0–36.0)
MCV: 87.8 fL (ref 78.0–100.0)
Platelets: 226 10*3/uL (ref 150–400)
RBC: 3.76 MIL/uL — ABNORMAL LOW (ref 3.87–5.11)
RDW: 15.6 % — ABNORMAL HIGH (ref 11.5–15.5)
WBC: 7.8 10*3/uL (ref 4.0–10.5)

## 2016-10-20 LAB — MAGNESIUM: MAGNESIUM: 2.2 mg/dL (ref 1.7–2.4)

## 2016-10-20 MED ORDER — LEVOTHYROXINE SODIUM 50 MCG PO TABS: ORAL_TABLET | ORAL | 0 refills | Status: DC

## 2016-10-20 MED ORDER — ISOSORBIDE MONONITRATE ER 30 MG PO TB24: 30.0000 mg | ORAL_TABLET | Freq: Every day | ORAL | 0 refills | Status: DC

## 2016-10-20 MED ORDER — LEVOTHYROXINE SODIUM 100 MCG PO TABS: ORAL_TABLET | ORAL | 0 refills | Status: DC

## 2016-10-20 MED ORDER — NITROGLYCERIN 0.4 MG SL SUBL: 0.4000 mg | SUBLINGUAL_TABLET | SUBLINGUAL | 0 refills | Status: DC | PRN

## 2016-10-20 MED ORDER — LORAZEPAM 0.5 MG PO TABS: 0.5000 mg | ORAL_TABLET | ORAL | 0 refills | Status: AC | PRN

## 2016-10-20 NOTE — Evaluation (Signed)
Occupational Therapy Evaluation Patient Details Name: Kelsey Joseph MRN: [id number] DOB: 1928/02/06 Today's Date: 10/20/2016    History of Present Illness Pt is an 83 y/o female admitted secondary to chest pain. Found to have NSTEMI, however, per cardiology notes, will manage conservatively. Dopplers negative for DVT. PMH includes HTN, CHF, a fib, SSS, peripheral neuropathy, L TKA, R AKA, and s/p pacemaker placement.    Clinical Impression   Pt requires min guard assist in this environment for standing and transfers, likely to perform modified independently in her home set up. Long conversation with pt and her daughter about pt going to and ILF vs ALF. No further OT needs.    Follow Up Recommendations  No OT follow up    Equipment Recommendations       Recommendations for Other Services       Precautions / Restrictions Precautions Precautions: Fall Precaution Comments: R AKA; does not use prosthesis Restrictions Weight Bearing Restrictions: No      Mobility Bed Mobility Overal bed mobility: Modified Independent             General bed mobility comments: In chair upon entry   Transfers Overall transfer level: Needs assistance   Transfers: Sit to/from Stand Sit to Stand: Min guard   Squat pivot transfers: Min guard     General transfer comment: for safety as she managed her skirt    Balance Overall balance assessment: Needs assistance Sitting-balance support: No upper extremity supported;Feet supported Sitting balance-Leahy Scale: Good     Standing balance support: Bilateral upper extremity supported;During functional activity Standing balance-Leahy Scale: Poor Standing balance comment: Reliant on UE support during transfers                           ADL either performed or assessed with clinical judgement   ADL Overall ADL's : Needs assistance/impaired Eating/Feeding: Independent   Grooming: Brushing hair;Sitting;Set up   Upper Body  Bathing: Set up;Sitting   Lower Body Bathing: Min guard;Sit to/from stand   Upper Body Dressing : Set up;Sitting   Lower Body Dressing: Min guard;Sit to/from stand   Toilet Transfer: Min guard;Stand-pivot   Toileting- Water quality scientist and Hygiene: Min guard;Sit to/from stand       Functional mobility during ADLs:  (uses scooter for mobility) General ADL Comments: Pt likely to perform modified independently in her own environment.     Vision Baseline Vision/History: Wears glasses Wears Glasses: Reading only Patient Visual Report: No change from baseline       Perception     Praxis      Pertinent Vitals/Pain Pain Assessment: No/denies pain     Hand Dominance Right   Extremity/Trunk Assessment Upper Extremity Assessment Upper Extremity Assessment: Overall WFL for tasks assessed   Lower Extremity Assessment Lower Extremity Assessment: Defer to PT evaluation RLE Deficits / Details: AKA       Communication Communication Communication: No difficulties   Cognition Arousal/Alertness: Awake/alert Behavior During Therapy: WFL for tasks assessed/performed Overall Cognitive Status: Within Functional Limits for tasks assessed                                     General Comments  Pt reports recent falls secondary to not shutting off power scooter. Educated to ensure safety with transfers by locking power chair. Educated to put note on power chair if needed as reminder. Educated about  having daughters put out clothes if needed, as pt reports closet is too small to fit power chair. Also, educated about importance of taking time with mobility and to not get in hurry for transfers.     Exercises     Shoulder Instructions      Home Living Family/patient expects to be discharged to:: Private residence Living Arrangements: Alone Available Help at Discharge: Family;Neighbor;Available PRN/intermittently (housekeeper) Type of Home: Other(Comment)  (townhome) Home Access: Level entry     Home Layout: One level     Bathroom Shower/Tub: Occupational psychologist: Handicapped height     Home Equipment: Transport planner;Shower seat;Shower seat - built in;Wheelchair - manual;Crutches   Additional Comments: Has had recent fall because she did not lock her scooter.       Prior Functioning/Environment Level of Independence: Independent with assistive device(s)        Comments: Uses scooter to get around. Performs squat pivots independently to chair. Exercises regularly at the [facility]        OT Problem List: Impaired balance (sitting and/or standing)      OT Treatment/Interventions:      OT Goals(Current goals can be found in the care plan section) Acute Rehab OT Goals Patient Stated Goal: to go home today   OT Frequency:     Barriers to D/C:            Co-evaluation              AM-PAC PT "6 Clicks" Daily Activity     Outcome Measure Help from another person eating meals?: None Help from another person taking care of personal grooming?: None Help from another person toileting, which includes using toliet, bedpan, or urinal?: A Little Help from another person bathing (including washing, rinsing, drying)?: A Little Help from another person to put on and taking off regular upper body clothing?: None Help from another person to put on and taking off regular lower body clothing?: A Little 6 Click Score: 21   End of Session    Activity Tolerance: Patient tolerated treatment well Patient left: in bed;with call bell/phone within reach;with nursing/sitter in room;with family/visitor present  OT Visit Diagnosis: Unsteadiness on feet (R26.81)                Time: 9030-0923 OT Time Calculation (min): 30 min Charges:  OT General Charges $OT Visit: 1 Procedure OT Evaluation $OT Eval Low Complexity: 1 Procedure OT Treatments $Self Care/Home Management : 8-22 mins G-Codes:      [name] 10/20/2016, 3:20 PM  [phone]

## 2016-10-20 NOTE — Discharge Summary (Signed)
Physician Discharge Summary  Kelsey Joseph YWV:[id number] DOB: 1928-02-08 DOA: 10/17/2016  PCP: [name], MD  Admit date: 10/17/2016 Discharge date: 10/20/2016  Time spent: 35 minutes  Recommendations for Outpatient Follow-up:  Chest pain syndrome, cardiac with likely a component of CHF/Acute NSTEMI ( HEART score 5-6)  - Last 2 D echo 2016 EF 60-65, nl syst function and Gr 2 DD  -continue ASA, O2 and NTG as needed -Echocardiogram shows worsening diastolic CHF 2 D echo  -TSH WNL  Acute on Chronic diastolic CHF -Echocardiogram shows worsening of diastolic CHF see results below -Strict I&O since admission -4.9 L -Daily weight Filed Weights   10/18/16 0453 10/19/16 0500 10/20/16 0545  Weight: 132 lb (59.9 kg) 135 lb 14.4 oz (61.6 kg) 135 lb 14.4 oz (61.6 kg)  -Cardizem CD 360 mg daily -Metoprolol 100 mg BID -Follow-up cardiology PA Dayna Dunn 7/24 -Follow-up cardiology Dr.Mihai Croitoru on 8/22  Atrial Fibrillation (CHA2DS2-VASc score 6-7) -Patient currently paced rhythm -Not on Chronic anticoagulation secondary to previous GI bleed       -See CHF        Essential HTN       -See CHF       -Schedule follow-up Dr. [name] 1-2 weeks NSTEMI, HTN, OSA, diabetes type          2, HLD,  LLE swelling. -Resolved   OSA -Continue CPAP nightly  Acute on Chronic kidney disease stage3(baseline Cr1-1.2) Lab Results  Component Value Date   CREATININE 1.07 (H) 10/20/2016   CREATININE 1.19 (H) 10/19/2016   CREATININE 1.13 (H) 10/18/2016  -At baseline  DM type II controlled with complications -Hemoglobin A1c= 6.5  HLD -Continue pravastatin 40 mg daily   Hypothyroidism -Synthroid per home regimen      Discharge Diagnoses:  Active Problems:   Hypothyroidism   OBSTRUCTIVE SLEEP APNEA   Essential hypertension   Atrial fibrillation (HCC)   GERD   HYPERSOMNIA   Hyperlipidemia   Pacemaker   Amputee, hip   Chronic anticoagulation   Acute on chronic  diastolic CHF (congestive heart failure) (HCC)   Chest pain   Discharge Condition: Stable  Diet recommendation: ADA/AHA  Filed Weights   10/18/16 0453 10/19/16 0500 10/20/16 0545  Weight: 132 lb (59.9 kg) 135 lb 14.4 oz (61.6 kg) 135 lb 14.4 oz (61.6 kg)    History of present illness:  83 y.o.WF PMHx HTN, PAF, GERD, h/o SSS, sinus arrest S/P s/p Medtr[employer] PPM 03/2014, OSA, Peripheral neuropathy  Presenting to the ED with sudden onset of constant, moderate, dull substernal chest pain radiating to both upper extremities and neck, Associated with cough and wheezing since 1 am. She took 2 ASA without significant relief. EMS gave her 1 NTG with some improvement of her pain. Pain notworsened with deep inspiration, movement or exertion. Denies any dizziness or falls. No syncope or presyncope. Denies any fever or chills. Denies any nausea, vomiting or abdominal pain. Appetite is normal and eats salt rich foods Reports new onset of left leg swelling but denies calf pain. Denies any headaches or vision changes. Denies any seizures No confusion reported. She has never had an MI.No recent long distance trips. Denies any new stressors. No new meds. She is on daily hormonal therapy. No new herbal supplements. No tobacco, ETOH or recreational drugs. Previous hospitalization patient was found to have NSTEMI, and secondary atrial fibrillation. Patient was evaluated by cardiology and found not appropriate for cardiac catheterization at this time. Patient did receive stress test  which showed multiple defects. Cardiology chose at this time to treat conservatively.   Procedures: 7/15 Echocardiogram:- LVEF= 60% to 65%. - (grade 2 diastolic 7/67 LLE Doppler: Negative DVT/SVT  7/16 Myoview stress test:-Negative ST segment deviation noted during stress. -T wave inversion was noted during stress in the II, III, aVF, V3, V4, V5 and V6 leads, ending at 3 minutes of stress, and returning to baseline after less  than 1 min of recovery. -Defect 1: There is a medium defect of moderate severity present in the mid inferior, apical inferior, apical lateral and apex location. -Findings consistent with prior myocardial infarction with peri-infarct ischemia. -Nuclear stress EF: 61%. The left ventricular ejection fraction is normal (55-65%). This is an intermediate risk study    Consultations: Cardiology Dr.Mihai,Croitoru,      Discharge Exam: Vitals:   10/20/16 0024 10/20/16 0545 10/20/16 0732 10/20/16 1137  BP: 107/64 108/61 (!) 110/55 (!) 94/54  Pulse: 98 99 98 91  Resp: 19  13 18   Temp: 97.6 F (36.4 C) 97.9 F (36.6 C) 98 F (36.7 C) 98 F (36.7 C)  TempSrc: Oral Oral Oral Oral  SpO2: 97% 99% 98% 98%  Weight:  135 lb 14.4 oz (61.6 kg)    Height:        General: A/O 4, No acute respiratory distress Eyes: negative scleral hemorrhage, negative anisocoria, negative icterus ENT: Negative Runny nose, negative gingival bleeding, Neck:  Negative scars, masses, torticollis, lymphadenopathy, JVD Lungs: Clear to auscultation bilaterally without wheezes or crackles Cardiovascular: Regular rate and rhythm (paced) without murmur gallop or rub normal S1 and S2   Discharge Instructions   Allergies as of 10/20/2016      Reactions   Latex Other (See Comments)   Patient states only "latex bandages" causes blisters   Sulfa Antibiotics Diarrhea   Severe diarrhea   Adhesive [tape] Other (See Comments)   Blisters, when left on for "a while."      Medication List    STOP taking these medications   irbesartan 300 MG tablet Commonly known as:  AVAPRO   meloxicam 15 MG tablet Commonly known as:  MOBIC     TAKE these medications   acetaminophen 500 MG tablet Commonly known as:  TYLENOL Take 1,000 mg by mouth every 6 (six) hours as needed for headache (pain).   BIOTIN PO Take 1 tablet by mouth daily.   CALCIUM + D PO Take 1 tablet by mouth daily.   conjugated estrogens vaginal  cream Commonly known as:  PREMARIN Place 1 Applicatorful vaginally once a week.   diltiazem 360 MG 24 hr capsule Commonly known as:  TIAZAC TAKE 1 CAPSULE DAILY   estradiol 0.5 MG tablet Commonly known as:  ESTRACE Take 0.5 mg by mouth at bedtime.   Fish Oil 1000 MG Caps Take 1,000 mg by mouth daily.   fluticasone 50 MCG/ACT nasal spray Commonly known as:  FLONASE INSERT 1 SPRAY INTO EACH NARE AT BEDTIME AS NEEDED FOR CONGESTION/ALLERGIES   gabapentin 300 MG capsule Commonly known as:  NEURONTIN TAKE 1 CAPSULE THREE TIMES A DAY   isosorbide mononitrate 30 MG 24 hr tablet Commonly known as:  IMDUR Take 1 tablet (30 mg total) by mouth daily.   levothyroxine 100 MCG tablet Commonly known as:  SYNTHROID, LEVOTHROID 1 tablet PO Sunday, Monday, Tuesday, Thursday, Friday, Saturday What changed:  how much to take  how to take this  when to take this  additional instructions   levothyroxine 50 MCG tablet Commonly  known as:  SYNTHROID, LEVOTHROID 1 tablet PO Wednesday What changed:  You were already taking a medication with the same name, and this prescription was added. Make sure you understand how and when to take each.   LORazepam 0.5 MG tablet Commonly known as:  ATIVAN Take 1 tablet (0.5 mg total) by mouth as needed for anxiety. What changed:  when to take this  reasons to take this   metoprolol tartrate 100 MG tablet Commonly known as:  LOPRESSOR TAKE 1 TABLET TWICE A DAY What changed:  See the new instructions.   montelukast 10 MG tablet Commonly known as:  SINGULAIR Take 10 mg by mouth daily.   nitroGLYCERIN 0.4 MG SL tablet Commonly known as:  NITROSTAT Place 1 tablet (0.4 mg total) under the tongue every 5 (five) minutes x 3 doses as needed for chest pain.   omeprazole 40 MG capsule Commonly known as:  PRILOSEC Take 40 mg by mouth daily.   pravastatin 40 MG tablet Commonly known as:  PRAVACHOL TAKE 1 TABLET EVERY EVENING What changed:  See the  new instructions.   PRESERVISION AREDS Tabs Take 1 tablet by mouth daily.   REFRESH OP Place 1 drop into both eyes daily.   torsemide 20 MG tablet Commonly known as:  DEMADEX Take 20 mg by mouth daily.   traMADol 50 MG tablet Commonly known as:  ULTRAM Take 50 mg by mouth every 6 (six) hours as needed for moderate pain.      Allergies  Allergen Reactions  . Latex Other (See Comments)    Patient states only "latex bandages" causes blisters  . Sulfa Antibiotics Diarrhea    Severe diarrhea  . Adhesive [Tape] Other (See Comments)    Blisters, when left on for "a while."   Follow-up Information    Dunn, Dayna N, PA-C Follow up on 10/27/2016.   Specialties:  Cardiology, Radiology Why:  10:30 AM (With Dr. Osborne Casco PA) Hospital follow-up. This will be at our [facility]. Contact information: [address] [facility] [place] [address] [phone]        Sanda Klein, MD Follow up on 11/25/2016.   Specialty:  Cardiology Why:  11:20 AM Contact information: [address] [place] [address] [phone]            The results of significant diagnostics from this hospitalization (including imaging, microbiology, ancillary and laboratory) are listed below for reference.    Significant Diagnostic Studies: X-ray Chest Pa And Lateral  Result Date: 10/18/2016 CLINICAL DATA:  Patient admitted 10/17/2016 with shortness of breath and chest pain. EXAM: CHEST  2 VIEW COMPARISON:  PA and lateral chest 10/17/2016. FINDINGS: Pulmonary edema seen on the comparison examination has resolved. Lungs are clear. Heart size is normal. No pneumothorax or pleural effusion. Pacing device is noted. IMPRESSION: Resolved pulmonary edema.  No acute disease. Electronically Signed   By: Inge Rise M.D.   On: 10/18/2016 10:36   Dg Chest 2 View  Result Date: 10/17/2016 CLINICAL DATA:  83 year old female with chest pain under wheezing. EXAM: CHEST  2  VIEW COMPARISON:  Chest radiograph dated 10/03/2014 FINDINGS: There is stable mild cardiomegaly with bilateral perihilar and interstitial prominence most consistent with edema. Superimposed pneumonia is not excluded. Clinical correlation is recommended. There is no pleural effusion or pneumothorax. Left pectoral pacemaker device noted. No acute osseous pathology. IMPRESSION: Bilateral airspace and interstitial densities likely edema. Pneumonia is not excluded. Clinical correlation is recommended. Electronically Signed  By: Anner Crete M.D.   On: 10/17/2016 03:38   Nm Myocar Multi W/spect W/wall Motion / Ef  Result Date: 10/19/2016  There was no ST segment deviation noted during stress.  T wave inversion was noted during stress in the II, III, aVF, V3, V4, V5 and V6 leads, ending at 3 minutes of stress, and returning to baseline after less than 1 min of recovery.  Defect 1: There is a medium defect of moderate severity present in the mid inferior, apical inferior, apical lateral and apex location.  Findings consistent with prior myocardial infarction with peri-infarct ischemia.  Nuclear stress EF: 61%. The left ventricular ejection fraction is normal (55-65%).  This is an intermediate risk study.     Microbiology: Recent Results (from the past 240 hour(s))  MRSA PCR Screening     Status: None   Collection Time: 10/17/16  4:10 PM  Result Value Ref Range Status   MRSA by PCR NEGATIVE NEGATIVE Final    Comment:        The GeneXpert MRSA Assay (FDA approved for NASAL specimens only), is one component of a comprehensive MRSA colonization surveillance program. It is not intended to diagnose MRSA infection nor to guide or monitor treatment for MRSA infections.      Labs: Basic Metabolic Panel:  Recent Labs Lab 10/17/16 0241 10/18/16 0334 10/19/16 0512 10/20/16 0310  NA 137 138 138 134*  K 4.6 3.9 4.0 3.8  CL 102 101 101 100*  CO2 24 27 28 24   GLUCOSE 166* 134* 125* 126*   BUN 50* 47* 42* 41*  CREATININE 1.45* 1.13* 1.19* 1.07*  CALCIUM 9.4 8.9 8.8* 8.6*  MG  --   --  2.1 2.2   Liver Function Tests: No results for input(s): AST, ALT, ALKPHOS, BILITOT, PROT, ALBUMIN in the last 168 hours. No results for input(s): LIPASE, AMYLASE in the last 168 hours. No results for input(s): AMMONIA in the last 168 hours. CBC:  Recent Labs Lab 10/17/16 0241 10/18/16 0334 10/19/16 0512 10/20/16 0310  WBC 6.8 6.6 6.8 7.8  HGB 11.8* 11.5* 11.7* 10.5*  HCT 36.9 36.3 36.4 33.0*  MCV 88.3 87.3 87.7 87.8  PLT 242 218 224 226   Cardiac Enzymes:  Recent Labs Lab 10/17/16 0507 10/17/16 1034 10/17/16 1554 10/19/16 0831  TROPONINI 0.06* 0.98* 3.17* 0.71*   BNP: BNP (last 3 results)  Recent Labs  10/17/16 0305  BNP 285.4*    ProBNP (last 3 results) No results for input(s): PROBNP in the last 8760 hours.  CBG:  Recent Labs Lab 10/19/16 1147 10/19/16 1645 10/19/16 2059 10/20/16 0735 10/20/16 1139  GLUCAP 110* 169* 177* 128* 117*       Signed:  Dia Crawford, MD [facility] [phone] pager

## 2016-10-20 NOTE — Telephone Encounter (Signed)
New message    TCM appt made per Ellen Henri for 7/24 with Melina Copa at [facility].

## 2016-10-20 NOTE — Evaluation (Signed)
Physical Therapy Evaluation Patient Details Name: Kelsey Joseph MRN: [id number] DOB: [birth year] Today's Date: 10/20/2016   History of Present Illness  Pt is an 83 y/o female admitted secondary to chest pain. Found to have NSTEMI, however, per cardiology notes, will manage conservatively. Dopplers negative for DVT. PMH includes HTN, CHF, a fib, SSS, peripheral neuropathy, L TKA, R AKA, and s/p pacemaker placement.   Clinical Impression  Pt is admitted secondary to problem above with deficits below. PTA, pt reports she was living alone and using power chair or manual chair for mobility. Reports independence with transfers to chair. Pt reports she was active and going to the [facility] for water aerobic classes. Upon eval, pt able to perform squat pivot with min guard assist for safety. Slight unsteadiness and weakness noted in LLE, however, no overt LOB noted. Educated about safe transfer techniques and safety at home. Will continue to follow acutely to ensure safety with mobility and for HEP for home use. Pt reports she feels like she does not need any PT follow up at this time. Will continue to follow acutely to ensure safety with mobility.     Follow Up Recommendations No PT follow up;Supervision for mobility/OOB    Equipment Recommendations  None recommended by PT    Recommendations for Other Services       Precautions / Restrictions Precautions Precautions: Fall;Other (comment) Precaution Comments: R AKA; does not use prosthesis Restrictions Weight Bearing Restrictions: No      Mobility  Bed Mobility               General bed mobility comments: In chair upon entry   Transfers Overall transfer level: Needs assistance   Transfers: Squat Pivot Transfers     Squat pivot transfers: Min guard     General transfer comment: Min guard for safety during squat pivot from recliner to bed and back to recliner. Demonstrated safe hand placement throughout. Slight unsteadiness noted,  however, no overt LOB.   Ambulation/Gait             General Gait Details: Uses power chair or manual WC at baseline.   Stairs            Wheelchair Mobility    Modified Rankin (Stroke Patients Only)       Balance Overall balance assessment: Needs assistance Sitting-balance support: No upper extremity supported;Feet supported Sitting balance-Leahy Scale: Good     Standing balance support: Bilateral upper extremity supported;During functional activity Standing balance-Leahy Scale: Poor Standing balance comment: Reliant on UE support during transfers                             Pertinent Vitals/Pain Pain Assessment: No/denies pain    Home Living Family/patient expects to be discharged to:: Private residence Living Arrangements: Alone Available Help at Discharge: Family;Neighbor;Available PRN/intermittently Type of Home: Other(Comment) (town home ) Home Access: Level entry     Home Layout: One level Home Equipment: Transport planner;Shower seat;Shower seat - built in;Wheelchair - manual;Crutches Additional Comments: Has had recent fall because she did not lock her scooter.     Prior Function Level of Independence: Independent with assistive device(s)         Comments: Uses scooter to get around. Performs squat pivots independently to chair. Exercises regularly at the [facility].     Hand Dominance   Dominant Hand: Right    Extremity/Trunk Assessment   Upper Extremity Assessment Upper Extremity Assessment: Defer  to OT evaluation    Lower Extremity Assessment Lower Extremity Assessment: RLE deficits/detail;Generalized weakness RLE Deficits / Details: AKA       Communication   Communication: No difficulties  Cognition Arousal/Alertness: Awake/alert Behavior During Therapy: WFL for tasks assessed/performed Overall Cognitive Status: Within Functional Limits for tasks assessed                                         General Comments General comments (skin integrity, edema, etc.): Pt reports recent falls secondary to not shutting off power scooter. Educated to ensure safety with transfers by locking power chair. Educated to put note on power chair if needed as reminder. Educated about having daughters put out clothes if needed, as pt reports closet is too small to fit power chair. Also, educated about importance of taking time with mobility and to not get in hurry for transfers.     Exercises     Assessment/Plan    PT Assessment Patient needs continued PT services  PT Problem List Decreased strength;Decreased balance;Decreased mobility;Decreased knowledge of use of DME;Decreased knowledge of precautions       PT Treatment Interventions DME instruction;Functional mobility training;Therapeutic activities;Therapeutic exercise;Balance training;Neuromuscular re-education;Patient/family education;Wheelchair mobility training    PT Goals (Current goals can be found in the Care Plan section)  Acute Rehab PT Goals Patient Stated Goal: to go home today  PT Goal Formulation: With patient Time For Goal Achievement: 10/27/16 Potential to Achieve Goals: Good    Frequency Min 3X/week   Barriers to discharge Decreased caregiver support Lives alone, but daughters and neighbors available to check on pt     Co-evaluation               AM-PAC PT "6 Clicks" Daily Activity  Outcome Measure Difficulty turning over in bed (including adjusting bedclothes, sheets and blankets)?: A Little Difficulty moving from lying on back to sitting on the side of the bed? : A Lot Difficulty sitting down on and standing up from a chair with arms (e.g., wheelchair, bedside commode, etc,.)?: Total Help needed moving to and from a bed to chair (including a wheelchair)?: A Little Help needed walking in hospital room?: Total Help needed climbing 3-5 steps with a railing? : Total 6 Click Score: 11    End of Session   Activity  Tolerance: Patient tolerated treatment well Patient left: in chair;with call bell/phone within reach Nurse Communication: Mobility status PT Visit Diagnosis: Other abnormalities of gait and mobility (R26.89);History of falling (Z91.81)    Time: 0349-1791 PT Time Calculation (min) (ACUTE ONLY): 24 min   Charges:   PT Evaluation $PT Eval Low Complexity: 1 Procedure PT Treatments $Therapeutic Activity: 8-22 mins   PT G Codes:        Leighton Ruff, PT, DPT  Acute Rehabilitation Services  Pager: [phone]   Rudean Hitt 10/20/2016, 12:34 PM

## 2016-10-20 NOTE — Progress Notes (Signed)
ANTICOAGULATION CONSULT NOTE  Pharmacy Consult:  Heparin  Indication: chest pain/ACS/STEMI  Allergies  Allergen Reactions  . Latex Other (See Comments)    Patient states only "latex bandages" causes blisters  . Sulfa Antibiotics Diarrhea    Severe diarrhea  . Adhesive [Tape] Other (See Comments)    Blisters, when left on for "a while."    Patient Measurements: Height: 5\' 3"  (160 cm) Weight: 135 lb 14.4 oz (61.6 kg) IBW/kg (Calculated) : 52.4 Heparin Dosing Weight: 60 kg  Vital Signs: Temp: 98 F (36.7 C) (07/17 0732) Temp Source: Oral (07/17 0732) BP: 110/55 (07/17 0732) Pulse Rate: 98 (07/17 0732)  Labs:  Recent Labs  10/17/16 1034 10/17/16 1554  10/18/16 0334 10/19/16 0512 10/19/16 0831 10/19/16 1605 10/20/16 0310  HGB  --   --   < > 11.5* 11.7*  --   --  10.5*  HCT  --   --   --  36.3 36.4  --   --  33.0*  PLT  --   --   --  218 224  --   --  226  LABPROT  --   --   --  15.1  --   --   --   --   INR  --   --   --  1.19  --   --   --   --   HEPARINUNFRC  --   --   --   --  0.49  --  0.43 0.38  CREATININE  --   --   --  1.13* 1.19*  --   --  1.07*  TROPONINI 0.98* 3.17*  --   --   --  0.71*  --   --   < > = values in this interval not displayed.  Estimated Creatinine Clearance: 30.6 mL/min (A) (by C-G formula based on SCr of 1.07 mg/dL (H)).    Assessment: 42 YOF admitted on 10/17/16 with chest pain and started on IV heparin.  Heparin level therapeutic; no bleeding reported.  Noted history of GIB and severe anemia preventing anticoagulation for AFib.   Goal of Therapy:  Heparin level 0.3-0.7 units/ml Monitor platelets by anticoagulation protocol: Yes    Plan:   Continue heparin gtt at 700 units/hr Daily heparin level and CBC F/U reduce gabapentin for CrCL    Ellaree Gear D. Mina Marble, PharmD, BCPS Pager:  [phone] 10/20/2016, 9:08 AM

## 2016-10-20 NOTE — Progress Notes (Signed)
Progress Note  Patient Name: Kelsey Joseph Date of Encounter: 10/20/2016  Primary Cardiologist: Dr. Sallyanne Kuster   Subjective   No complaints. Currently CP free. No dyspnea.   Inpatient Medications    Scheduled Meds: . aspirin EC  81 mg Oral Daily  . cycloSPORINE  1 drop Both Eyes BID  . diltiazem  360 mg Oral Daily  . estradiol  0.5 mg Oral QPM  . gabapentin  300 mg Oral TID  . insulin aspart  0-9 Units Subcutaneous TID WC  . isosorbide mononitrate  30 mg Oral Daily  . levothyroxine  100 mcg Oral Once per day on Sun Mon Tue Thu Fri Sat  . [START ON 10/21/2016] levothyroxine  50 mcg Oral Once per day on Wed  . metoprolol tartrate  100 mg Oral BID  . montelukast  10 mg Oral Daily  . pantoprazole  40 mg Oral Daily  . polyvinyl alcohol  1 drop Both Eyes BID  . pravastatin  40 mg Oral QPM  . torsemide  20 mg Oral Daily   Continuous Infusions: . heparin 700 Units/hr (10/19/16 2225)   PRN Meds: acetaminophen, albuterol, gi cocktail, guaiFENesin, LORazepam, nitroGLYCERIN, ondansetron (ZOFRAN) IV, traMADol   Vital Signs    Vitals:   10/20/16 0000 10/20/16 0024 10/20/16 0545 10/20/16 0732  BP:  107/64 108/61 (!) 110/55  Pulse:  98 99 98  Resp: 16 19  13   Temp:  97.6 F (36.4 C) 97.9 F (36.6 C) 98 F (36.7 C)  TempSrc:  Oral Oral Oral  SpO2:  97% 99% 98%  Weight:   135 lb 14.4 oz (61.6 kg)   Height:        Intake/Output Summary (Last 24 hours) at 10/20/16 0811 Last data filed at 10/19/16 1300  Gross per 24 hour  Intake              240 ml  Output              700 ml  Net             -460 ml   Filed Weights   10/18/16 0453 10/19/16 0500 10/20/16 0545  Weight: 132 lb (59.9 kg) 135 lb 14.4 oz (61.6 kg) 135 lb 14.4 oz (61.6 kg)    Telemetry    NSR- Personally Reviewed  ECG    Paced rhythm 62 bpm - Personally Reviewed  Physical Exam   GEN: No acute distress.   Neck: No JVD Cardiac: RRR, no murmurs, rubs, or gallops.  Respiratory: Clear to auscultation  bilaterally. GI: Soft, nontender, non-distended  MS: No edema; RLE amputee Neuro:  Nonfocal  Psych: Normal affect   Labs    Chemistry Recent Labs Lab 10/18/16 0334 10/19/16 0512 10/20/16 0310  NA 138 138 134*  K 3.9 4.0 3.8  CL 101 101 100*  CO2 27 28 24   GLUCOSE 134* 125* 126*  BUN 47* 42* 41*  CREATININE 1.13* 1.19* 1.07*  CALCIUM 8.9 8.8* 8.6*  GFRNONAA 42* 40* 45*  GFRAA 49* 46* 53*  ANIONGAP 10 9 10      Hematology Recent Labs Lab 10/18/16 0334 10/19/16 0512 10/20/16 0310  WBC 6.6 6.8 7.8  RBC 4.16 4.15 3.76*  HGB 11.5* 11.7* 10.5*  HCT 36.3 36.4 33.0*  MCV 87.3 87.7 87.8  MCH 27.6 28.2 27.9  MCHC 31.7 32.1 31.8  RDW 15.4 15.5 15.6*  PLT 218 224 226    Cardiac Enzymes Recent Labs Lab 10/17/16 0507 10/17/16 1034 10/17/16  1554 10/19/16 0831  TROPONINI 0.06* 0.98* 3.17* 0.71*    Recent Labs Lab 10/17/16 0305  TROPIPOC 0.00     BNP Recent Labs Lab 10/17/16 0305  BNP 285.4*     DDimer No results for input(s): DDIMER in the last 168 hours.   Radiology    X-ray Chest Pa And Lateral  Result Date: 10/18/2016 CLINICAL DATA:  Patient admitted 10/17/2016 with shortness of breath and chest pain. EXAM: CHEST  2 VIEW COMPARISON:  PA and lateral chest 10/17/2016. FINDINGS: Pulmonary edema seen on the comparison examination has resolved. Lungs are clear. Heart size is normal. No pneumothorax or pleural effusion. Pacing device is noted. IMPRESSION: Resolved pulmonary edema.  No acute disease. Electronically Signed   By: Inge Rise M.D.   On: 10/18/2016 10:36   Nm Myocar Multi W/spect W/wall Motion / Ef  Result Date: 10/19/2016  There was no ST segment deviation noted during stress.  T wave inversion was noted during stress in the II, III, aVF, V3, V4, V5 and V6 leads, ending at 3 minutes of stress, and returning to baseline after less than 1 min of recovery.  Defect 1: There is a medium defect of moderate severity present in the mid inferior,  apical inferior, apical lateral and apex location.  Findings consistent with prior myocardial infarction with peri-infarct ischemia.  Nuclear stress EF: 61%. The left ventricular ejection fraction is normal (55-65%).  This is an intermediate risk study.     Cardiac Studies   2D Echo 10/18/16 Study Conclusions  - Left ventricle: The cavity size was normal. Systolic function was   normal. The estimated ejection fraction was in the range of 60%   to 65%. Wall motion was normal; there were no regional wall   motion abnormalities. Features are consistent with a pseudonormal   left ventricular filling pattern, with concomitant abnormal   relaxation and increased filling pressure (grade 2 diastolic   dysfunction). - Aortic valve: There was trivial regurgitation. - Left atrium: The atrium was mildly dilated.  NST 10/19/16: see results above in Radiology Reports   Patient Profile     83 y.o. female with a hx of SSS, PAFib/flutter, CHA2DS2VASc score 5 however not on a/c given h/o GIB with severe anemia,  HTN, chronic diastolic HF and PPM (MDT), admitted for NSTEMI.  Assessment & Plan    1. NSTEM: felt to be high risk for revascularization given bleed risk. Conservative approach taken with medical management. Her NST yesterday showed a mostly fixed and moderate size inferior defect. The risk of revascularization appears greater than the benefit. Continue medical therapy with BB, CCB, low dose ASA and statin. Imdur added yesterday. Tolerating well. BP stable. No recurrent CP, however she is still on IV heparin. Will discuss with MD when to d/c.   2. PAF/Flutter: rate is controled with Cardizem and metoprolol. CHA2DS2VASc score 5 however not on a/c given h/o GIB with severe anemia in the past. Not a good candidate for Watchman device.   3. PPM: [employer] device. Implanted for SSS.   4. Chronic Diastolic HF: no dyspnea. I/Os net negative 4.7 L. Lungs are CTAB. No peripheral edema.   5. HTN:  controlled on current regimen.   Signed, Lyda Jester, PA-C  10/20/2016, 8:11 AM    I have seen and examined the patient along with Lyda Jester, PA-C.   I have reviewed the chart, notes and new data.  I agree with PA/NP's note.  Key new complaints: No angina or dyspnea  Key examination changes: clinically euvolemic Key new findings / data: creat 1.07, K 3.8  PLAN: Keep on triple antianginal therapy: long acting nitrates, diltiazem, beta blocker. Increase daily diuretic dose and add K supplement. Stop ARB to avoid hypotension with enhanced diuretic and antianginal therapy. Daily weights and call if weight increases > 3lb/24h or 5 lb/week. Early office follow up. ASA as only antiplatelet due to bleeding history.  Sanda Klein, MD, [place] [phone] 10/20/2016, 9:39 AM

## 2016-10-20 NOTE — Progress Notes (Signed)
Pt is on CPAP at this time tolerating it well.  

## 2016-10-21 LAB — HEMOGLOBIN A1C
Hgb A1c MFr Bld: 6.4 % — ABNORMAL HIGH (ref 4.8–5.6)
Hgb A1c MFr Bld: 6.6 % — ABNORMAL HIGH (ref 4.8–5.6)
Mean Plasma Glucose: 137 mg/dL
Mean Plasma Glucose: 143 mg/dL

## 2016-10-22 NOTE — Consult Note (Signed)
           [facility] CM Primary Care Navigator  10/22/2016  Kelsey Joseph Aug 31, 1927 [id number]   Attempted to see patientyesterday at the bedsideto identify possible discharge needs but she was alreadydischarged.  Patient was discharged home per staff report .  Noted that provider's office Marland KitchenEllen Henri) has already attempted a transition of care call for post hospital follow-up.   Primary care provider's office called (Caren)to notify of patient's discharge and possible need for post hospital follow-up and transition of care. Reminded as well of patient's health issues (particularly HF management) needing follow-up.   Made aware to refer patient to [facility] care management ifdeemed appropriateand necessary for further services.   For questions, please contact:  Dannielle Huh, BSN, RN- [facility] Primary Care Navigator  Telephone: [phone] [place]

## 2016-10-26 ENCOUNTER — Encounter: Payer: Self-pay | Admitting: Physician Assistant

## 2016-10-26 DIAGNOSIS — M79605 Pain in left leg: Secondary | ICD-10-CM | POA: Diagnosis not present
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
## 2016-10-27 ENCOUNTER — Ambulatory Visit (INDEPENDENT_AMBULATORY_CARE_PROVIDER_SITE_OTHER): Payer: Medicare Other | Admitting: Physician Assistant

## 2016-10-27 ENCOUNTER — Encounter: Payer: Self-pay | Admitting: Physician Assistant

## 2016-10-27 ENCOUNTER — Telehealth: Payer: Self-pay | Admitting: Physician Assistant

## 2016-10-27 VITALS — BP 118/56 | HR 88 | Ht 63.0 in | Wt 137.8 lb

## 2016-10-27 DIAGNOSIS — I4892 Unspecified atrial flutter: Secondary | ICD-10-CM | POA: Diagnosis not present

## 2016-10-27 DIAGNOSIS — N183 Chronic kidney disease, stage 3 unspecified: Secondary | ICD-10-CM

## 2016-10-27 DIAGNOSIS — I480 Paroxysmal atrial fibrillation: Secondary | ICD-10-CM | POA: Diagnosis not present

## 2016-10-27 DIAGNOSIS — I2510 Atherosclerotic heart disease of native coronary artery without angina pectoris: Secondary | ICD-10-CM | POA: Diagnosis not present

## 2016-10-27 DIAGNOSIS — I5032 Chronic diastolic (congestive) heart failure: Secondary | ICD-10-CM

## 2016-10-27 LAB — BASIC METABOLIC PANEL
BUN / CREAT RATIO: 42 — AB (ref 12–28)
BUN: 44 mg/dL — ABNORMAL HIGH (ref 8–27)
CHLORIDE: 97 mmol/L (ref 96–106)
CO2: 24 mmol/L (ref 20–29)
Calcium: 9.6 mg/dL (ref 8.7–10.3)
Creatinine, Ser: 1.04 mg/dL — ABNORMAL HIGH (ref 0.57–1.00)
GFR calc non Af Amer: 48 mL/min/{1.73_m2} — ABNORMAL LOW (ref >59)
GFR, EST AFRICAN AMERICAN: 56 mL/min/{1.73_m2} — AB (ref >59)
GLUCOSE: 166 mg/dL — AB (ref 65–99)
POTASSIUM: 4.5 mmol/L (ref 3.5–5.2)
Sodium: 141 mmol/L (ref 134–144)

## 2016-10-27 MED ORDER — ASPIRIN EC 81 MG PO TBEC: 81.0000 mg | DELAYED_RELEASE_TABLET | Freq: Every day | ORAL | 3 refills | Status: DC

## 2016-10-27 NOTE — Progress Notes (Signed)
Yes, Kelsey Joseph. She should be on aspirin 81 mg daily, please. MCr

## 2016-10-27 NOTE — Telephone Encounter (Signed)
Called pt and advised her that per Melina Copa, PA-C. That Dr. Sallyanne Kuster wanted her to start taking a Aspirin 81 mg daily. Pt agreeable to this plan and medication added to her regimen list.

## 2016-10-27 NOTE — Telephone Encounter (Signed)
Please let patient know Dr. Sallyanne Kuster wants her to take a baby aspirin 81mg  daily. If she notices any bleeding such as blood in stool, black tarry stools, blood in urine, nosebleeds or any other unusual bleeding, seek medical attention. Dontavia Brand PA-C

## 2016-10-27 NOTE — Patient Instructions (Addendum)
Medication Instructions:  Your physician recommends that you continue on your current medications as directed. Please refer to the Current Medication list given to you today.  Labwork: TODAY:  BMET  Testing/Procedures: None ordered  Follow-Up: Your physician recommends that you schedule a follow-up appointment in: [place]   Any Other Special Instructions Will Be Listed Below (If Applicable).     If you need a refill on your cardiac medications before your next appointment, please call your pharmacy.

## 2016-10-28 ENCOUNTER — Telehealth: Payer: Self-pay | Admitting: Physician Assistant

## 2016-10-28 NOTE — Telephone Encounter (Signed)
-----   Message from Charlie Pitter, [place] sent at 10/27/2016  4:45 PM EDT ----- Overall please let patient know BMET appears generally stable - renal function remains similar. At her OV, we talked about taking extra tablet as needed for swelling. Based on these numbers I'd advise her to continue torsemide 20mg  daily (with 1 extra tablet daily as needed for swelling up to 2 days a week). Call if worsened edema or weight gain over 3lb, or feeling like she needs to take it more than this. Also recommend to elevate the lower leg (SHE IS S/P AMPUTATION OF THE R) and use compression hose to help control swelling as well. Please review 2000mg  sodium restriction and ~64oz fluid restriction. Dayna Dunn PA-C

## 2016-10-28 NOTE — Telephone Encounter (Signed)
Returned call to pt. See result note for lab.

## 2016-10-28 NOTE — Telephone Encounter (Signed)
New message ° ° ° °Pt is returning call about labs.  °

## 2016-10-30 ENCOUNTER — Telehealth: Payer: Self-pay | Admitting: *Deleted

## 2016-10-30 ENCOUNTER — Encounter: Payer: Self-pay | Admitting: Cardiovascular Disease

## 2016-10-30 MED ORDER — POTASSIUM CHLORIDE ER 10 MEQ PO TBCR: EXTENDED_RELEASE_TABLET | ORAL | 3 refills | Status: DC

## 2016-10-30 NOTE — Telephone Encounter (Signed)
Kelsey Joseph  to Sanda Klein, MD   10:23 AM  Dr. Sallyanne Kuster mentioned that he wants me to be on potassium by prescription.  If that is the case, could you send a prescription to [facility]?   Thanks.   [place] patient that I do not see mention of potassium supplement, recent lab work K 4.5, advised I would verify with Dr. Sallyanne Kuster.

## 2016-10-30 NOTE — Telephone Encounter (Signed)
Rx sent to preferred pharmacy. Called patient and made aware.  Verbalized understanding.

## 2016-10-30 NOTE — Telephone Encounter (Signed)
She should have a standing prescription for potassium chloride 10 mEq to take on days when she takes higher than usual dose of torsemide for fluid gain.#30, 3 Rf. Thanks [employer]

## 2016-11-02 ENCOUNTER — Encounter: Payer: Self-pay | Admitting: Cardiovascular Disease

## 2016-11-02 ENCOUNTER — Telehealth: Payer: Self-pay | Admitting: *Deleted

## 2016-11-02 NOTE — Telephone Encounter (Signed)
LEFT MESSAGE TO CALL BACK  PATIENT REQUEST PRESCRIPTION FOR IMDUR 30 MG  2 TABLETS DAILY.  IN REVIEWING PATIENT CHART NO INDICATION OF MEDICATION CHANGE .   LEFT MESSAGE TO CALL BACK TO DISCUSS BEFORE SENDING PRESCRIPTION TO MAIL ORDER.

## 2016-11-05 ENCOUNTER — Telehealth: Payer: Self-pay | Admitting: Cardiovascular Disease

## 2016-11-05 NOTE — Telephone Encounter (Signed)
Pt calling returning Sharon,RN, concerning medication Imdur. Pt would like a call back. Please address. Thanks

## 2016-11-10 MED ORDER — ISOSORBIDE MONONITRATE ER 30 MG PO TB24: 30.0000 mg | ORAL_TABLET | Freq: Every day | ORAL | 3 refills | Status: DC

## 2016-11-10 NOTE — Telephone Encounter (Signed)
SEE OTHER TELEPHONE NOTE 

## 2016-11-10 NOTE — Telephone Encounter (Signed)
Late entry---Spoke to patient. She states she only takes 1 tablet of imdur 30 mg daily. E-sent prescription to mail order

## 2016-11-19 DIAGNOSIS — D509 Iron deficiency anemia, unspecified: Secondary | ICD-10-CM | POA: Diagnosis not present

## 2016-11-19 DIAGNOSIS — I10 Essential (primary) hypertension: Secondary | ICD-10-CM | POA: Diagnosis not present

## 2016-11-19 DIAGNOSIS — Z0000 Encounter for general adult medical examination without abnormal findings: Secondary | ICD-10-CM | POA: Diagnosis not present

## 2016-11-19 DIAGNOSIS — E039 Hypothyroidism, unspecified: Secondary | ICD-10-CM | POA: Diagnosis not present

## 2016-11-19 DIAGNOSIS — R799 Abnormal finding of blood chemistry, unspecified: Secondary | ICD-10-CM | POA: Diagnosis not present

## 2016-11-19 DIAGNOSIS — D649 Anemia, unspecified: Secondary | ICD-10-CM | POA: Diagnosis not present

## 2016-11-19 DIAGNOSIS — E7800 Pure hypercholesterolemia, unspecified: Secondary | ICD-10-CM | POA: Diagnosis not present

## 2016-11-19 DIAGNOSIS — N390 Urinary tract infection, site not specified: Secondary | ICD-10-CM | POA: Diagnosis not present

## 2016-11-23 ENCOUNTER — Ambulatory Visit (INDEPENDENT_AMBULATORY_CARE_PROVIDER_SITE_OTHER): Payer: Medicare Other | Admitting: *Deleted

## 2016-11-23 DIAGNOSIS — I495 Sick sinus syndrome: Secondary | ICD-10-CM | POA: Diagnosis not present

## 2016-11-24 DIAGNOSIS — K5730 Diverticulosis of large intestine without perforation or abscess without bleeding: Secondary | ICD-10-CM | POA: Diagnosis not present

## 2016-11-24 DIAGNOSIS — E119 Type 2 diabetes mellitus without complications: Secondary | ICD-10-CM | POA: Diagnosis not present

## 2016-11-24 DIAGNOSIS — I10 Essential (primary) hypertension: Secondary | ICD-10-CM | POA: Diagnosis not present

## 2016-11-24 DIAGNOSIS — G4733 Obstructive sleep apnea (adult) (pediatric): Secondary | ICD-10-CM | POA: Diagnosis not present

## 2016-11-24 DIAGNOSIS — B078 Other viral warts: Secondary | ICD-10-CM | POA: Diagnosis not present

## 2016-11-25 ENCOUNTER — Ambulatory Visit (INDEPENDENT_AMBULATORY_CARE_PROVIDER_SITE_OTHER): Payer: Medicare Other | Admitting: Cardiovascular Disease

## 2016-11-25 ENCOUNTER — Encounter: Payer: Self-pay | Admitting: Cardiovascular Disease

## 2016-11-25 VITALS — BP 100/50 | HR 70 | Ht 63.0 in | Wt 134.0 lb

## 2016-11-25 DIAGNOSIS — J9601 Acute respiratory failure with hypoxia: Secondary | ICD-10-CM | POA: Diagnosis not present

## 2016-11-25 DIAGNOSIS — I5032 Chronic diastolic (congestive) heart failure: Secondary | ICD-10-CM

## 2016-11-25 DIAGNOSIS — I10 Essential (primary) hypertension: Secondary | ICD-10-CM

## 2016-11-25 DIAGNOSIS — E7800 Pure hypercholesterolemia, unspecified: Secondary | ICD-10-CM

## 2016-11-25 DIAGNOSIS — D500 Iron deficiency anemia secondary to blood loss (chronic): Secondary | ICD-10-CM | POA: Diagnosis not present

## 2016-11-25 DIAGNOSIS — Z950 Presence of cardiac pacemaker: Secondary | ICD-10-CM | POA: Diagnosis not present

## 2016-11-25 DIAGNOSIS — I495 Sick sinus syndrome: Secondary | ICD-10-CM | POA: Diagnosis not present

## 2016-11-25 DIAGNOSIS — I219 Acute myocardial infarction, unspecified: Secondary | ICD-10-CM | POA: Diagnosis not present

## 2016-11-25 DIAGNOSIS — I480 Paroxysmal atrial fibrillation: Secondary | ICD-10-CM

## 2016-11-25 DIAGNOSIS — I2510 Atherosclerotic heart disease of native coronary artery without angina pectoris: Secondary | ICD-10-CM

## 2016-11-25 LAB — CUP PACEART REMOTE DEVICE CHECK
Battery Remaining Longevity: 80 mo
Battery Voltage: 3.01 V
Brady Statistic AP VP Percent: 35.05 %
Brady Statistic AP VS Percent: 57.06 %
Brady Statistic AS VS Percent: 1.66 %
Brady Statistic RV Percent Paced: 41.25 %
Implantable Lead Location: 753859
Implantable Lead Location: 753860
Implantable Lead Model: 5076
Lead Channel Impedance Value: 361 Ohm
Lead Channel Impedance Value: 399 Ohm
Lead Channel Pacing Threshold Amplitude: 0.625 V
Lead Channel Pacing Threshold Amplitude: 1 V
Lead Channel Sensing Intrinsic Amplitude: 1.75 mV
Lead Channel Sensing Intrinsic Amplitude: 12.625 mV
Lead Channel Setting Pacing Amplitude: 2 V
Lead Channel Setting Pacing Pulse Width: 0.4 ms
Lead Channel Setting Sensing Sensitivity: 2 mV
MDC IDC LEAD IMPLANT DT: 20151215
MDC IDC LEAD IMPLANT DT: 20151215
MDC IDC MSMT LEADCHNL RA IMPEDANCE VALUE: 494 Ohm
MDC IDC MSMT LEADCHNL RA PACING THRESHOLD PULSEWIDTH: 0.4 ms
MDC IDC MSMT LEADCHNL RA SENSING INTR AMPL: 1.75 mV
MDC IDC MSMT LEADCHNL RV IMPEDANCE VALUE: 399 Ohm
MDC IDC MSMT LEADCHNL RV PACING THRESHOLD PULSEWIDTH: 0.4 ms
MDC IDC MSMT LEADCHNL RV SENSING INTR AMPL: 12.625 mV
MDC IDC PG IMPLANT DT: 20151215
MDC IDC SESS DTM: 20180820124106
MDC IDC SET LEADCHNL RA PACING AMPLITUDE: 1.5 V
MDC IDC STAT BRADY AS VP PERCENT: 6.22 %
MDC IDC STAT BRADY RA PERCENT PACED: 87.9 %

## 2016-11-25 NOTE — Progress Notes (Signed)
Remote pacemaker transmission.   
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
## 2016-11-25 NOTE — Patient Instructions (Signed)
Dr Sallyanne Kuster recommends that you continue on your current medications as directed. Please refer to the Current Medication list given to you today.  Remote monitoring is used to monitor your Pacemaker of ICD from home. This monitoring reduces the number of office visits required to check your device to one time per year. It allows [place] to keep an eye on the functioning of your device to ensure it is working properly. You are scheduled for a device check from home on Monday, November 19th, 2018. You may send your transmission at any time that day. If you have a wireless device, the transmission will be sent automatically. After your physician reviews your transmission, you will receive a postcard with your next transmission date.  Dr Sallyanne Kuster recommends that you schedule a follow-up appointment in 6 months with a pacemaker check. You will receive a reminder letter in the mail two months in advance. If you don't receive a letter, please call our office to schedule the follow-up appointment.  If you need a refill on your cardiac medications before your next appointment, please call your pharmacy.

## 2016-11-27 ENCOUNTER — Other Ambulatory Visit: Payer: Self-pay

## 2016-11-27 MED ORDER — ISOSORBIDE MONONITRATE ER 30 MG PO TB24: 30.0000 mg | ORAL_TABLET | Freq: Every day | ORAL | 3 refills | Status: DC

## 2016-11-29 ENCOUNTER — Other Ambulatory Visit: Payer: Self-pay | Admitting: Cardiovascular Disease

## 2016-11-30 NOTE — Telephone Encounter (Signed)
Rx request sent to pharmacy.  

## 2016-12-04 ENCOUNTER — Encounter: Payer: Self-pay | Admitting: Cardiology

## 2016-12-22 DIAGNOSIS — Z85828 Personal history of other malignant neoplasm of skin: Secondary | ICD-10-CM | POA: Diagnosis not present

## 2016-12-22 DIAGNOSIS — L821 Other seborrheic keratosis: Secondary | ICD-10-CM | POA: Diagnosis not present

## 2016-12-22 DIAGNOSIS — D692 Other nonthrombocytopenic purpura: Secondary | ICD-10-CM | POA: Diagnosis not present

## 2016-12-22 DIAGNOSIS — D1801 Hemangioma of skin and subcutaneous tissue: Secondary | ICD-10-CM | POA: Diagnosis not present

## 2016-12-22 DIAGNOSIS — M7130 Other bursal cyst, unspecified site: Secondary | ICD-10-CM | POA: Diagnosis not present

## 2016-12-22 DIAGNOSIS — L820 Inflamed seborrheic keratosis: Secondary | ICD-10-CM | POA: Diagnosis not present

## 2017-02-08 DIAGNOSIS — Z23 Encounter for immunization: Secondary | ICD-10-CM | POA: Diagnosis not present

## 2017-02-22 ENCOUNTER — Ambulatory Visit (INDEPENDENT_AMBULATORY_CARE_PROVIDER_SITE_OTHER): Payer: Medicare Other | Admitting: *Deleted

## 2017-02-22 ENCOUNTER — Telehealth: Payer: Self-pay | Admitting: Cardiology

## 2017-02-22 DIAGNOSIS — I495 Sick sinus syndrome: Secondary | ICD-10-CM | POA: Diagnosis not present

## 2017-02-22 NOTE — Telephone Encounter (Signed)
LMOVM reminding pt to send remote transmission.   

## 2017-02-23 DIAGNOSIS — E119 Type 2 diabetes mellitus without complications: Secondary | ICD-10-CM | POA: Diagnosis not present

## 2017-02-23 DIAGNOSIS — D500 Iron deficiency anemia secondary to blood loss (chronic): Secondary | ICD-10-CM | POA: Diagnosis not present

## 2017-02-23 LAB — CUP PACEART REMOTE DEVICE CHECK
Battery Remaining Longevity: 84 mo
Battery Voltage: 3.01 V
Brady Statistic AP VS Percent: 92.74 %
Brady Statistic AS VP Percent: 0.11 %
Date Time Interrogation Session: 20181119202503
Implantable Lead Implant Date: 20151215
Implantable Lead Implant Date: 20151215
Implantable Lead Location: 753859
Implantable Lead Location: 753860
Implantable Pulse Generator Implant Date: 20151215
Lead Channel Impedance Value: 399 Ohm
Lead Channel Impedance Value: 437 Ohm
Lead Channel Sensing Intrinsic Amplitude: 1.5 mV
Lead Channel Sensing Intrinsic Amplitude: 18.25 mV
Lead Channel Setting Pacing Amplitude: 1.75 V
Lead Channel Setting Pacing Amplitude: 2 V
Lead Channel Setting Sensing Sensitivity: 2 mV
MDC IDC MSMT LEADCHNL RA IMPEDANCE VALUE: 456 Ohm
MDC IDC MSMT LEADCHNL RA IMPEDANCE VALUE: 551 Ohm
MDC IDC MSMT LEADCHNL RA PACING THRESHOLD AMPLITUDE: 0.875 V
MDC IDC MSMT LEADCHNL RA PACING THRESHOLD PULSEWIDTH: 0.4 ms
MDC IDC MSMT LEADCHNL RA SENSING INTR AMPL: 1.5 mV
MDC IDC MSMT LEADCHNL RV PACING THRESHOLD AMPLITUDE: 0.875 V
MDC IDC MSMT LEADCHNL RV PACING THRESHOLD PULSEWIDTH: 0.4 ms
MDC IDC MSMT LEADCHNL RV SENSING INTR AMPL: 18.25 mV
MDC IDC SET LEADCHNL RV PACING PULSEWIDTH: 0.4 ms
MDC IDC STAT BRADY AP VP PERCENT: 6.48 %
MDC IDC STAT BRADY AS VS PERCENT: 0.67 %
MDC IDC STAT BRADY RA PERCENT PACED: 98.88 %
MDC IDC STAT BRADY RV PERCENT PACED: 6.92 %

## 2017-02-23 NOTE — Progress Notes (Signed)
Remote pacemaker transmission.   

## 2017-03-02 DIAGNOSIS — E7800 Pure hypercholesterolemia, unspecified: Secondary | ICD-10-CM | POA: Diagnosis not present

## 2017-03-02 DIAGNOSIS — I10 Essential (primary) hypertension: Secondary | ICD-10-CM | POA: Diagnosis not present

## 2017-03-02 DIAGNOSIS — D509 Iron deficiency anemia, unspecified: Secondary | ICD-10-CM | POA: Diagnosis not present

## 2017-03-02 DIAGNOSIS — E119 Type 2 diabetes mellitus without complications: Secondary | ICD-10-CM | POA: Diagnosis not present

## 2017-03-04 ENCOUNTER — Encounter: Payer: Self-pay | Admitting: Cardiology

## 2017-04-05 DIAGNOSIS — R600 Localized edema: Secondary | ICD-10-CM | POA: Diagnosis not present

## 2017-04-05 DIAGNOSIS — E119 Type 2 diabetes mellitus without complications: Secondary | ICD-10-CM | POA: Diagnosis not present

## 2017-04-05 DIAGNOSIS — I10 Essential (primary) hypertension: Secondary | ICD-10-CM | POA: Diagnosis not present

## 2017-04-07 ENCOUNTER — Other Ambulatory Visit: Payer: Self-pay | Admitting: Cardiovascular Disease

## 2017-04-12 DIAGNOSIS — R600 Localized edema: Secondary | ICD-10-CM | POA: Diagnosis not present

## 2017-04-13 ENCOUNTER — Ambulatory Visit: Payer: Medicare Other | Admitting: *Deleted

## 2017-05-18 DIAGNOSIS — H401131 Primary open-angle glaucoma, bilateral, mild stage: Secondary | ICD-10-CM | POA: Diagnosis not present

## 2017-05-18 DIAGNOSIS — H02831 Dermatochalasis of right upper eyelid: Secondary | ICD-10-CM | POA: Diagnosis not present

## 2017-05-18 DIAGNOSIS — H02834 Dermatochalasis of left upper eyelid: Secondary | ICD-10-CM | POA: Diagnosis not present

## 2017-05-18 DIAGNOSIS — H16142 Punctate keratitis, left eye: Secondary | ICD-10-CM | POA: Diagnosis not present

## 2017-05-18 DIAGNOSIS — H16223 Keratoconjunctivitis sicca, not specified as Sjogren's, bilateral: Secondary | ICD-10-CM | POA: Diagnosis not present

## 2017-05-18 DIAGNOSIS — Z961 Presence of intraocular lens: Secondary | ICD-10-CM | POA: Diagnosis not present

## 2017-05-20 DIAGNOSIS — D539 Nutritional anemia, unspecified: Secondary | ICD-10-CM | POA: Diagnosis not present

## 2017-05-20 DIAGNOSIS — I10 Essential (primary) hypertension: Secondary | ICD-10-CM | POA: Diagnosis not present

## 2017-05-20 DIAGNOSIS — I4891 Unspecified atrial fibrillation: Secondary | ICD-10-CM | POA: Diagnosis not present

## 2017-05-20 DIAGNOSIS — I252 Old myocardial infarction: Secondary | ICD-10-CM | POA: Diagnosis not present

## 2017-05-20 DIAGNOSIS — E119 Type 2 diabetes mellitus without complications: Secondary | ICD-10-CM | POA: Diagnosis not present

## 2017-05-20 DIAGNOSIS — E7800 Pure hypercholesterolemia, unspecified: Secondary | ICD-10-CM | POA: Diagnosis not present

## 2017-05-20 DIAGNOSIS — D509 Iron deficiency anemia, unspecified: Secondary | ICD-10-CM | POA: Diagnosis not present

## 2017-05-24 ENCOUNTER — Ambulatory Visit (INDEPENDENT_AMBULATORY_CARE_PROVIDER_SITE_OTHER): Payer: Medicare Other | Admitting: *Deleted

## 2017-05-24 DIAGNOSIS — I495 Sick sinus syndrome: Secondary | ICD-10-CM | POA: Diagnosis not present

## 2017-05-25 LAB — CUP PACEART REMOTE DEVICE CHECK
Battery Voltage: 3.01 V
Brady Statistic AP VP Percent: 3.63 %
Brady Statistic AP VS Percent: 95.99 %
Brady Statistic AS VP Percent: 0.02 %
Brady Statistic AS VS Percent: 0.36 %
Brady Statistic RA Percent Paced: 98.95 %
Brady Statistic RV Percent Paced: 3.87 %
Date Time Interrogation Session: 20190218134250
Implantable Lead Implant Date: 20151215
Implantable Lead Location: 753859
Implantable Lead Model: 5076
Implantable Pulse Generator Implant Date: 20151215
Lead Channel Impedance Value: 399 Ohm
Lead Channel Impedance Value: 437 Ohm
Lead Channel Impedance Value: 532 Ohm
Lead Channel Pacing Threshold Amplitude: 0.5 V
Lead Channel Pacing Threshold Amplitude: 0.875 V
Lead Channel Sensing Intrinsic Amplitude: 18.25 mV
Lead Channel Setting Pacing Amplitude: 1.5 V
Lead Channel Setting Pacing Pulse Width: 0.4 ms
Lead Channel Setting Sensing Sensitivity: 2 mV
MDC IDC LEAD IMPLANT DT: 20151215
MDC IDC LEAD LOCATION: 753860
MDC IDC MSMT BATTERY REMAINING LONGEVITY: 83 mo
MDC IDC MSMT LEADCHNL RA IMPEDANCE VALUE: 456 Ohm
MDC IDC MSMT LEADCHNL RA PACING THRESHOLD PULSEWIDTH: 0.4 ms
MDC IDC MSMT LEADCHNL RA SENSING INTR AMPL: 2.375 mV
MDC IDC MSMT LEADCHNL RA SENSING INTR AMPL: 2.375 mV
MDC IDC MSMT LEADCHNL RV PACING THRESHOLD PULSEWIDTH: 0.4 ms
MDC IDC MSMT LEADCHNL RV SENSING INTR AMPL: 18.25 mV
MDC IDC SET LEADCHNL RV PACING AMPLITUDE: 2 V

## 2017-05-25 LAB — CUP PACEART INCLINIC DEVICE CHECK
Implantable Lead Implant Date: 20151215
Implantable Lead Location: 753859
Implantable Lead Model: 5076
Lead Channel Setting Pacing Amplitude: 1.5 V
Lead Channel Setting Pacing Amplitude: 2 V
Lead Channel Setting Pacing Pulse Width: 0.4 ms
MDC IDC LEAD IMPLANT DT: 20151215
MDC IDC LEAD LOCATION: 753860
MDC IDC PG IMPLANT DT: 20151215
MDC IDC SESS DTM: 20190219143302
MDC IDC SET LEADCHNL RV SENSING SENSITIVITY: 2 mV

## 2017-05-25 NOTE — Progress Notes (Signed)
Remote pacemaker transmission.   

## 2017-05-27 ENCOUNTER — Encounter: Payer: Self-pay | Admitting: Cardiology

## 2017-05-27 DIAGNOSIS — M19212 Secondary osteoarthritis, left shoulder: Secondary | ICD-10-CM | POA: Diagnosis not present

## 2017-05-27 DIAGNOSIS — M19211 Secondary osteoarthritis, right shoulder: Secondary | ICD-10-CM | POA: Diagnosis not present

## 2017-06-01 DIAGNOSIS — H401131 Primary open-angle glaucoma, bilateral, mild stage: Secondary | ICD-10-CM | POA: Diagnosis not present

## 2017-06-07 ENCOUNTER — Other Ambulatory Visit: Payer: Self-pay

## 2017-06-07 ENCOUNTER — Encounter (HOSPITAL_COMMUNITY): Payer: Self-pay | Admitting: Oncology

## 2017-06-07 ENCOUNTER — Emergency Department (HOSPITAL_COMMUNITY): Payer: Medicare Other

## 2017-06-07 ENCOUNTER — Inpatient Hospital Stay (HOSPITAL_COMMUNITY)
Admission: EM | Admit: 2017-06-07 | Discharge: 2017-06-13 | DRG: 193 | Disposition: A | Discharge status: Home Health | Payer: Medicare Other | Attending: Internal Medicine | Admitting: Internal Medicine

## 2017-06-07 DIAGNOSIS — I4891 Unspecified atrial fibrillation: Secondary | ICD-10-CM | POA: Diagnosis not present

## 2017-06-07 DIAGNOSIS — E039 Hypothyroidism, unspecified: Secondary | ICD-10-CM | POA: Diagnosis present

## 2017-06-07 DIAGNOSIS — Z950 Presence of cardiac pacemaker: Secondary | ICD-10-CM | POA: Diagnosis not present

## 2017-06-07 DIAGNOSIS — I10 Essential (primary) hypertension: Secondary | ICD-10-CM | POA: Diagnosis present

## 2017-06-07 DIAGNOSIS — R0602 Shortness of breath: Secondary | ICD-10-CM | POA: Diagnosis not present

## 2017-06-07 DIAGNOSIS — G546 Phantom limb syndrome with pain: Secondary | ICD-10-CM | POA: Diagnosis not present

## 2017-06-07 DIAGNOSIS — K219 Gastro-esophageal reflux disease without esophagitis: Secondary | ICD-10-CM | POA: Diagnosis present

## 2017-06-07 DIAGNOSIS — Z96652 Presence of left artificial knee joint: Secondary | ICD-10-CM | POA: Diagnosis present

## 2017-06-07 DIAGNOSIS — Z961 Presence of intraocular lens: Secondary | ICD-10-CM | POA: Diagnosis present

## 2017-06-07 DIAGNOSIS — J9601 Acute respiratory failure with hypoxia: Secondary | ICD-10-CM | POA: Diagnosis present

## 2017-06-07 DIAGNOSIS — I495 Sick sinus syndrome: Secondary | ICD-10-CM

## 2017-06-07 DIAGNOSIS — J189 Pneumonia, unspecified organism: Secondary | ICD-10-CM | POA: Diagnosis not present

## 2017-06-07 DIAGNOSIS — D539 Nutritional anemia, unspecified: Secondary | ICD-10-CM | POA: Diagnosis not present

## 2017-06-07 DIAGNOSIS — D509 Iron deficiency anemia, unspecified: Secondary | ICD-10-CM | POA: Diagnosis not present

## 2017-06-07 DIAGNOSIS — R042 Hemoptysis: Secondary | ICD-10-CM | POA: Diagnosis present

## 2017-06-07 DIAGNOSIS — Z882 Allergy status to sulfonamides status: Secondary | ICD-10-CM

## 2017-06-07 DIAGNOSIS — I252 Old myocardial infarction: Secondary | ICD-10-CM | POA: Diagnosis not present

## 2017-06-07 DIAGNOSIS — Z791 Long term (current) use of non-steroidal anti-inflammatories (NSAID): Secondary | ICD-10-CM

## 2017-06-07 DIAGNOSIS — E119 Type 2 diabetes mellitus without complications: Secondary | ICD-10-CM

## 2017-06-07 DIAGNOSIS — Z79899 Other long term (current) drug therapy: Secondary | ICD-10-CM

## 2017-06-07 DIAGNOSIS — I480 Paroxysmal atrial fibrillation: Secondary | ICD-10-CM | POA: Diagnosis present

## 2017-06-07 DIAGNOSIS — E1122 Type 2 diabetes mellitus with diabetic chronic kidney disease: Secondary | ICD-10-CM | POA: Diagnosis present

## 2017-06-07 DIAGNOSIS — Z87891 Personal history of nicotine dependence: Secondary | ICD-10-CM

## 2017-06-07 DIAGNOSIS — Z7982 Long term (current) use of aspirin: Secondary | ICD-10-CM

## 2017-06-07 DIAGNOSIS — R069 Unspecified abnormalities of breathing: Secondary | ICD-10-CM | POA: Diagnosis not present

## 2017-06-07 DIAGNOSIS — Z90710 Acquired absence of both cervix and uterus: Secondary | ICD-10-CM | POA: Diagnosis not present

## 2017-06-07 DIAGNOSIS — I5032 Chronic diastolic (congestive) heart failure: Secondary | ICD-10-CM

## 2017-06-07 DIAGNOSIS — E872 Acidosis: Secondary | ICD-10-CM | POA: Diagnosis present

## 2017-06-07 DIAGNOSIS — Z801 Family history of malignant neoplasm of trachea, bronchus and lung: Secondary | ICD-10-CM

## 2017-06-07 DIAGNOSIS — Z8049 Family history of malignant neoplasm of other genital organs: Secondary | ICD-10-CM

## 2017-06-07 DIAGNOSIS — I2510 Atherosclerotic heart disease of native coronary artery without angina pectoris: Secondary | ICD-10-CM | POA: Diagnosis present

## 2017-06-07 DIAGNOSIS — Z91040 Latex allergy status: Secondary | ICD-10-CM

## 2017-06-07 DIAGNOSIS — Z9841 Cataract extraction status, right eye: Secondary | ICD-10-CM | POA: Diagnosis not present

## 2017-06-07 DIAGNOSIS — Z79890 Hormone replacement therapy (postmenopausal): Secondary | ICD-10-CM

## 2017-06-07 DIAGNOSIS — Z91048 Other nonmedicinal substance allergy status: Secondary | ICD-10-CM

## 2017-06-07 DIAGNOSIS — R05 Cough: Secondary | ICD-10-CM | POA: Diagnosis not present

## 2017-06-07 DIAGNOSIS — Z9842 Cataract extraction status, left eye: Secondary | ICD-10-CM | POA: Diagnosis not present

## 2017-06-07 DIAGNOSIS — N183 Chronic kidney disease, stage 3 (moderate): Secondary | ICD-10-CM | POA: Diagnosis present

## 2017-06-07 DIAGNOSIS — I130 Hypertensive heart and chronic kidney disease with heart failure and stage 1 through stage 4 chronic kidney disease, or unspecified chronic kidney disease: Secondary | ICD-10-CM | POA: Diagnosis present

## 2017-06-07 DIAGNOSIS — E785 Hyperlipidemia, unspecified: Secondary | ICD-10-CM | POA: Diagnosis present

## 2017-06-07 DIAGNOSIS — B961 Klebsiella pneumoniae [K. pneumoniae] as the cause of diseases classified elsewhere: Secondary | ICD-10-CM | POA: Diagnosis present

## 2017-06-07 DIAGNOSIS — Z8701 Personal history of pneumonia (recurrent): Secondary | ICD-10-CM

## 2017-06-07 DIAGNOSIS — Z833 Family history of diabetes mellitus: Secondary | ICD-10-CM

## 2017-06-07 DIAGNOSIS — Z79891 Long term (current) use of opiate analgesic: Secondary | ICD-10-CM

## 2017-06-07 DIAGNOSIS — N390 Urinary tract infection, site not specified: Secondary | ICD-10-CM | POA: Diagnosis not present

## 2017-06-07 DIAGNOSIS — Z89611 Acquired absence of right leg above knee: Secondary | ICD-10-CM | POA: Diagnosis not present

## 2017-06-07 DIAGNOSIS — G4733 Obstructive sleep apnea (adult) (pediatric): Secondary | ICD-10-CM | POA: Diagnosis not present

## 2017-06-07 DIAGNOSIS — Z9981 Dependence on supplemental oxygen: Secondary | ICD-10-CM

## 2017-06-07 DIAGNOSIS — Z09 Encounter for follow-up examination after completed treatment for conditions other than malignant neoplasm: Secondary | ICD-10-CM

## 2017-06-07 DIAGNOSIS — Z806 Family history of leukemia: Secondary | ICD-10-CM

## 2017-06-07 DIAGNOSIS — H9190 Unspecified hearing loss, unspecified ear: Secondary | ICD-10-CM | POA: Diagnosis present

## 2017-06-07 DIAGNOSIS — Z811 Family history of alcohol abuse and dependence: Secondary | ICD-10-CM

## 2017-06-07 DIAGNOSIS — Z8249 Family history of ischemic heart disease and other diseases of the circulatory system: Secondary | ICD-10-CM

## 2017-06-07 DIAGNOSIS — R5383 Other fatigue: Secondary | ICD-10-CM | POA: Diagnosis not present

## 2017-06-07 DIAGNOSIS — E7800 Pure hypercholesterolemia, unspecified: Secondary | ICD-10-CM | POA: Diagnosis not present

## 2017-06-07 DIAGNOSIS — R918 Other nonspecific abnormal finding of lung field: Secondary | ICD-10-CM | POA: Diagnosis not present

## 2017-06-07 DIAGNOSIS — K210 Gastro-esophageal reflux disease with esophagitis: Secondary | ICD-10-CM | POA: Diagnosis not present

## 2017-06-07 LAB — URINALYSIS, ROUTINE W REFLEX MICROSCOPIC
Bilirubin Urine: NEGATIVE
Glucose, UA: NEGATIVE mg/dL
Ketones, ur: 5 mg/dL — AB
Leukocytes, UA: NEGATIVE
NITRITE: NEGATIVE
Protein, ur: 30 mg/dL — AB
SPECIFIC GRAVITY, URINE: 1.016 (ref 1.005–1.030)
pH: 8 (ref 5.0–8.0)

## 2017-06-07 LAB — BASIC METABOLIC PANEL
Anion gap: 12 (ref 5–15)
BUN: 22 mg/dL — AB (ref 6–20)
CALCIUM: 8.5 mg/dL — AB (ref 8.9–10.3)
CO2: 24 mmol/L (ref 22–32)
CREATININE: 0.94 mg/dL (ref 0.44–1.00)
Chloride: 102 mmol/L (ref 101–111)
GFR calc Af Amer: >60 mL/min (ref >60)
GFR calc non Af Amer: 53 mL/min — ABNORMAL LOW (ref >60)
GLUCOSE: 199 mg/dL — AB (ref 65–99)
Potassium: 3.8 mmol/L (ref 3.5–5.1)
Sodium: 138 mmol/L (ref 135–145)

## 2017-06-07 LAB — I-STAT TROPONIN, ED: Troponin i, poc: 0.06 ng/mL (ref 0.00–0.08)

## 2017-06-07 LAB — CBC WITH DIFFERENTIAL/PLATELET
Basophils Absolute: 0 K/uL (ref 0.0–0.1)
Basophils Relative: 0 %
Eosinophils Absolute: 0 K/uL (ref 0.0–0.7)
Eosinophils Relative: 0 %
HCT: 38.5 % (ref 36.0–46.0)
Hemoglobin: 13.1 g/dL (ref 12.0–15.0)
Lymphocytes Relative: 8 %
Lymphs Abs: 1.3 K/uL (ref 0.7–4.0)
MCH: 31.7 pg (ref 26.0–34.0)
MCHC: 34 g/dL (ref 30.0–36.0)
MCV: 93.2 fL (ref 78.0–100.0)
Monocytes Absolute: 0.6 K/uL (ref 0.1–1.0)
Monocytes Relative: 3 %
Neutro Abs: 15.6 K/uL — ABNORMAL HIGH (ref 1.7–7.7)
Neutrophils Relative %: 89 %
Platelets: 176 K/uL (ref 150–400)
RBC: 4.13 MIL/uL (ref 3.87–5.11)
RDW: 14.6 % (ref 11.5–15.5)
WBC: 17.5 K/uL — ABNORMAL HIGH (ref 4.0–10.5)

## 2017-06-07 LAB — I-STAT CG4 LACTIC ACID, ED
Lactic Acid, Venous: 2.37 mmol/L (ref 0.5–1.9)
Lactic Acid, Venous: 2.28 mmol/L (ref 0.5–1.9)

## 2017-06-07 LAB — INFLUENZA PANEL BY PCR (TYPE A & B)
Influenza A By PCR: NEGATIVE
Influenza B By PCR: NEGATIVE

## 2017-06-07 LAB — LACTIC ACID, PLASMA: Lactic Acid, Venous: 2.5 mmol/L (ref 0.5–1.9)

## 2017-06-07 MED ORDER — SODIUM CHLORIDE 0.9 % IV BOLUS (SEPSIS)
500.0000 mL | Freq: Once | INTRAVENOUS | Status: AC
  Administered 2017-06-07: 500 mL via INTRAVENOUS

## 2017-06-07 MED ORDER — DILTIAZEM HCL ER BEADS 240 MG PO CP24
360.0000 mg | ORAL_CAPSULE | Freq: Every day | ORAL | Status: DC
  Administered 2017-06-08 – 2017-06-13 (×6): 360 mg via ORAL
  Filled 2017-06-07 (×12): qty 1

## 2017-06-07 MED ORDER — SODIUM CHLORIDE 0.9 % IV SOLN
1.0000 g | INTRAVENOUS | Status: AC
  Administered 2017-06-08 – 2017-06-10 (×3): 1 g via INTRAVENOUS
  Filled 2017-06-07 (×3): qty 1

## 2017-06-07 MED ORDER — MIRABEGRON ER 25 MG PO TB24
25.0000 mg | ORAL_TABLET | Freq: Every evening | ORAL | Status: DC
  Administered 2017-06-07 – 2017-06-12 (×6): 25 mg via ORAL
  Filled 2017-06-07 (×7): qty 1

## 2017-06-07 MED ORDER — METOPROLOL TARTRATE 50 MG PO TABS
100.0000 mg | ORAL_TABLET | Freq: Two times a day (BID) | ORAL | Status: DC
  Administered 2017-06-07 – 2017-06-13 (×12): 100 mg via ORAL
  Filled 2017-06-07 (×12): qty 2

## 2017-06-07 MED ORDER — AZITHROMYCIN 500 MG IV SOLR
500.0000 mg | Freq: Once | INTRAVENOUS | Status: AC
  Administered 2017-06-07: 500 mg via INTRAVENOUS
  Filled 2017-06-07: qty 500

## 2017-06-07 MED ORDER — TRAMADOL HCL 50 MG PO TABS: 50.0000 mg | ORAL_TABLET | Freq: Four times a day (QID) | ORAL | Status: DC | PRN

## 2017-06-07 MED ORDER — SODIUM CHLORIDE 0.9 % IV SOLN
INTRAVENOUS | Status: DC
  Administered 2017-06-07 – 2017-06-09 (×4): via INTRAVENOUS

## 2017-06-07 MED ORDER — PANTOPRAZOLE SODIUM 40 MG PO TBEC
40.0000 mg | DELAYED_RELEASE_TABLET | Freq: Every day | ORAL | Status: DC
  Administered 2017-06-07 – 2017-06-13 (×7): 40 mg via ORAL
  Filled 2017-06-07 (×7): qty 1

## 2017-06-07 MED ORDER — POLYVINYL ALCOHOL 1.4 % OP SOLN
1.0000 [drp] | Freq: Two times a day (BID) | OPHTHALMIC | Status: DC | PRN
  Filled 2017-06-07: qty 15

## 2017-06-07 MED ORDER — FLUTICASONE PROPIONATE 50 MCG/ACT NA SUSP
2.0000 | Freq: Every day | NASAL | Status: DC
  Administered 2017-06-07 – 2017-06-13 (×7): 2 via NASAL
  Filled 2017-06-07: qty 16

## 2017-06-07 MED ORDER — IOPAMIDOL (ISOVUE-300) INJECTION 61%
INTRAVENOUS | Status: AC
  Administered 2017-06-07: 75 mL
  Filled 2017-06-07: qty 75

## 2017-06-07 MED ORDER — NITROGLYCERIN 0.4 MG SL SUBL: 0.4000 mg | SUBLINGUAL_TABLET | SUBLINGUAL | Status: DC | PRN

## 2017-06-07 MED ORDER — ENOXAPARIN SODIUM 40 MG/0.4ML ~~LOC~~ SOLN
40.0000 mg | SUBCUTANEOUS | Status: DC
  Administered 2017-06-07 – 2017-06-12 (×6): 40 mg via SUBCUTANEOUS
  Filled 2017-06-07 (×6): qty 0.4

## 2017-06-07 MED ORDER — PREGABALIN 75 MG PO CAPS
300.0000 mg | ORAL_CAPSULE | Freq: Two times a day (BID) | ORAL | Status: DC
  Administered 2017-06-07 – 2017-06-13 (×12): 300 mg via ORAL
  Filled 2017-06-07 (×12): qty 4

## 2017-06-07 MED ORDER — ASPIRIN EC 81 MG PO TBEC
81.0000 mg | DELAYED_RELEASE_TABLET | Freq: Every day | ORAL | Status: DC
  Administered 2017-06-07 – 2017-06-12 (×6): 81 mg via ORAL
  Filled 2017-06-07 (×6): qty 1

## 2017-06-07 MED ORDER — ESTRADIOL 1 MG PO TABS
0.5000 mg | ORAL_TABLET | Freq: Every day | ORAL | Status: DC
  Administered 2017-06-07 – 2017-06-12 (×6): 0.5 mg via ORAL
  Filled 2017-06-07 (×6): qty 0.5

## 2017-06-07 MED ORDER — CYCLOSPORINE 0.05 % OP EMUL
1.0000 [drp] | OPHTHALMIC | Status: DC | PRN
  Administered 2017-06-12: 1 [drp] via OPHTHALMIC
  Filled 2017-06-07 (×3): qty 1

## 2017-06-07 MED ORDER — CARBOXYMETHYLCELLULOSE SODIUM 1 % OP SOLN: 1.0000 [drp] | Freq: Two times a day (BID) | OPHTHALMIC | Status: DC | PRN

## 2017-06-07 MED ORDER — PROSIGHT PO TABS
1.0000 | ORAL_TABLET | Freq: Every day | ORAL | Status: DC
  Administered 2017-06-07 – 2017-06-13 (×7): 1 via ORAL
  Filled 2017-06-07 (×7): qty 1

## 2017-06-07 MED ORDER — SODIUM CHLORIDE 0.9 % IV SOLN
1.0000 g | Freq: Once | INTRAVENOUS | Status: AC
  Administered 2017-06-07: 1 g via INTRAVENOUS
  Filled 2017-06-07: qty 10

## 2017-06-07 MED ORDER — PRAVASTATIN SODIUM 40 MG PO TABS
40.0000 mg | ORAL_TABLET | Freq: Every evening | ORAL | Status: DC
  Administered 2017-06-07 – 2017-06-12 (×6): 40 mg via ORAL
  Filled 2017-06-07 (×6): qty 1

## 2017-06-07 MED ORDER — SODIUM CHLORIDE 0.9 % IJ SOLN
INTRAMUSCULAR | Status: AC
  Filled 2017-06-07: qty 50

## 2017-06-07 MED ORDER — LORAZEPAM 0.5 MG PO TABS
0.5000 mg | ORAL_TABLET | ORAL | Status: DC | PRN
  Administered 2017-06-07 – 2017-06-10 (×3): 0.5 mg via ORAL
  Filled 2017-06-07 (×3): qty 1

## 2017-06-07 MED ORDER — LATANOPROST 0.005 % OP SOLN
1.0000 [drp] | Freq: Every day | OPHTHALMIC | Status: DC
  Administered 2017-06-07 – 2017-06-12 (×6): 1 [drp] via OPHTHALMIC
  Filled 2017-06-07: qty 2.5

## 2017-06-07 MED ORDER — LEVOTHYROXINE SODIUM 100 MCG PO TABS
100.0000 ug | ORAL_TABLET | Freq: Every day | ORAL | Status: DC
  Administered 2017-06-08 – 2017-06-13 (×6): 100 ug via ORAL
  Filled 2017-06-07 (×6): qty 1

## 2017-06-07 MED ORDER — MONTELUKAST SODIUM 10 MG PO TABS
10.0000 mg | ORAL_TABLET | Freq: Every day | ORAL | Status: DC
  Administered 2017-06-07 – 2017-06-12 (×6): 10 mg via ORAL
  Filled 2017-06-07 (×6): qty 1

## 2017-06-07 MED ORDER — SODIUM CHLORIDE 0.9 % IV SOLN
500.0000 mg | INTRAVENOUS | Status: AC
  Administered 2017-06-08 – 2017-06-10 (×3): 500 mg via INTRAVENOUS
  Filled 2017-06-07 (×3): qty 500
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
## 2017-06-07 NOTE — Progress Notes (Signed)
PHARMACY NOTE -  Rocephin/Zmax  Pharmacy has been assisting with dosing of Rocephin and Zithromax for CAP.  The selected regimen does not require renal adjustment  Pharmacy will sign off, following peripherally for culture results or dose adjustments. Please reconsult if a change in clinical status warrants re-evaluation of dosage.  Reuel Boom, PharmD, BCPS [phone] 06/07/2017, 3:24 PM

## 2017-06-07 NOTE — ED Triage Notes (Signed)
Per EMS, pt is coming from home with complaints of sob. Pt reports having sob, productive cough, weakness, and burning with urination. Pt has a right aka and uses a motorized scooter. Pt is AO x4. Pt has a hx of a-fib, pacemaker, hypertension, and diabetes.

## 2017-06-08 DIAGNOSIS — R0602 Shortness of breath: Secondary | ICD-10-CM

## 2017-06-08 DIAGNOSIS — G4733 Obstructive sleep apnea (adult) (pediatric): Secondary | ICD-10-CM

## 2017-06-08 DIAGNOSIS — G546 Phantom limb syndrome with pain: Secondary | ICD-10-CM

## 2017-06-08 DIAGNOSIS — K210 Gastro-esophageal reflux disease with esophagitis: Secondary | ICD-10-CM

## 2017-06-08 LAB — CBC
HEMATOCRIT: 31.7 % — AB (ref 36.0–46.0)
HEMOGLOBIN: 10.5 g/dL — AB (ref 12.0–15.0)
MCH: 31.3 pg (ref 26.0–34.0)
MCHC: 33.1 g/dL (ref 30.0–36.0)
MCV: 94.6 fL (ref 78.0–100.0)
Platelets: 143 10*3/uL — ABNORMAL LOW (ref 150–400)
RBC: 3.35 MIL/uL — AB (ref 3.87–5.11)
RDW: 14.9 % (ref 11.5–15.5)
WBC: 16.3 10*3/uL — ABNORMAL HIGH (ref 4.0–10.5)

## 2017-06-08 LAB — EXPECTORATED SPUTUM ASSESSMENT W GRAM STAIN, RFLX TO RESP C

## 2017-06-08 LAB — GLUCOSE, CAPILLARY: Glucose-Capillary: 142 mg/dL — ABNORMAL HIGH (ref 65–99)

## 2017-06-08 LAB — BASIC METABOLIC PANEL
ANION GAP: 7 (ref 5–15)
BUN: 22 mg/dL — ABNORMAL HIGH (ref 6–20)
CHLORIDE: 108 mmol/L (ref 101–111)
CO2: 25 mmol/L (ref 22–32)
Calcium: 7.7 mg/dL — ABNORMAL LOW (ref 8.9–10.3)
Creatinine, Ser: 0.9 mg/dL (ref 0.44–1.00)
GFR calc non Af Amer: 56 mL/min — ABNORMAL LOW (ref >60)
Glucose, Bld: 166 mg/dL — ABNORMAL HIGH (ref 65–99)
POTASSIUM: 4 mmol/L (ref 3.5–5.1)
Sodium: 140 mmol/L (ref 135–145)

## 2017-06-08 LAB — LACTIC ACID, PLASMA: Lactic Acid, Venous: 1.5 mmol/L (ref 0.5–1.9)

## 2017-06-08 LAB — EXPECTORATED SPUTUM ASSESSMENT W REFEX TO RESP CULTURE

## 2017-06-08 LAB — STREP PNEUMONIAE URINARY ANTIGEN: STREP PNEUMO URINARY ANTIGEN: NEGATIVE

## 2017-06-08 LAB — HIV ANTIBODY (ROUTINE TESTING W REFLEX): HIV Screen 4th Generation wRfx: NONREACTIVE

## 2017-06-08 MED ORDER — IPRATROPIUM-ALBUTEROL 0.5-2.5 (3) MG/3ML IN SOLN
3.0000 mL | Freq: Four times a day (QID) | RESPIRATORY_TRACT | Status: DC
  Administered 2017-06-08: 3 mL via RESPIRATORY_TRACT
  Filled 2017-06-08: qty 3

## 2017-06-08 MED ORDER — GUAIFENESIN ER 600 MG PO TB12
1200.0000 mg | ORAL_TABLET | Freq: Two times a day (BID) | ORAL | Status: DC
  Administered 2017-06-08 – 2017-06-13 (×11): 1200 mg via ORAL
  Filled 2017-06-08 (×11): qty 2

## 2017-06-08 MED ORDER — IPRATROPIUM-ALBUTEROL 0.5-2.5 (3) MG/3ML IN SOLN
3.0000 mL | Freq: Three times a day (TID) | RESPIRATORY_TRACT | Status: DC
  Administered 2017-06-08 – 2017-06-09 (×3): 3 mL via RESPIRATORY_TRACT
  Filled 2017-06-08 (×3): qty 3

## 2017-06-08 NOTE — Progress Notes (Signed)
PROGRESS NOTE    Karri E Savoca  OJJ:[id number] DOB: 11-04-1927 DOA: 06/07/2017 PCP: [name], MD   Brief Narrative:  Kelsey Joseph is a 84 y.o. female with medical history significant of Chronic diastolic heart failure, PAF, hypertension, s/p PPM, presents with sob, associated with productive cough , and gen weakness for 3 days now. She reports she had a cold about a week and half. Since then she has progressed to worsening sob as of today. No fevers or chills. No chest pain, syncoope or dizziness. No nausea, vomiting or abd pain. No sensory deficits. Some burning sensation when urinating. No headaches, no dizziness .   On arrival to ED, she was hypoxic and she required 3 lit of [place] oxygen to keep sats greater than 95%. CXR and CT chest shows bilateral pneumonia.  Labs revealed elevated lactic acid and leukocytosis of 17,500. UA is cloudy and shows many bacteria. Admitted for Bilateral PNA and currently being treated.   Assessment & Plan:   Active Problems:   OBSTRUCTIVE SLEEP APNEA   GERD   Diabetes ([place])   Lower urinary tract infectious disease   Hypothyroid   Phantom limb pain (HCC)   PAF (paroxysmal atrial fibrillation) (HCC)   Hypertension   Pacemaker   Chronic diastolic heart failure ([place])   Community acquired pneumonia   Bilateral pneumonia   SOB (shortness of breath)   SSS (sick sinus syndrome) (HCC)  Bilateral Community Acquired Pneumonia -Admit to telemetry -Pneumonia Pathway  -C/w IV Azithromycin and Ceftriaxone -CT Scan showed Bilateral areas of ground-glass and more confluent airspace opacities consistent with multifocal pneumonia. No discrete masses to suggest neoplastic disease. Prominent mediastinal lymph nodes are noted which are presumed reactive. -Adde DuoNebs scheduled, Flutter Valve, Incentive Spirometry -C/w Supplemental [place] oxygen to keep sats greater than 90%.  -Blood cultures ordered and show NGTD <24 hours  -Sputum cultures ordered and pending.    -Strep Pneumo Urinary Ag Negative  -Added Guaifenesin 1200 mg po BID -Influenza A/B Negative; Check Respiratory Virus Panel -Repeat CXR in AM   SIRS with Acute respiratory failure with hypoxia 2/2 CAP Tachycardic and tachypnea on admission, resolved.  -Trended lactic acid and improved -Gently hydrate with NS at 75 mL/hr as she has a h/o of chronic diastolic heart failure.  Afebrile but with leukocytosis. Follow wbc counts.  Lactic Acidosis  -Patient's LA went from 2.37 -> 2.28 -> 2.5 -> 1.5  Essential Hypertension -C/w Metoprolol 100 mg po BID  H/o SSS s/p PPM, h/o PAF:  -Rate Controlled.  -C/w Metoprolol 100 mg po BID and Diltiazem 360 mg  -Not on Anticoagulation.  Currently in sinus.   Hypothyroidism -C/w Levothyroxine 100 mcg po Daily  Hyperlipidemia -C/w Pravastatin 40 mg po Daily    GERD -C/w Pantoprazole 40 mg po Daily   Peripheral Neuropathy -C/w Pregabalin 300 mg po BID  CAD  -No chest pain currently.  -Resume Aspirin 81 mg po qHS, Metoprolol 100 mg po BID, and Pravastatin 40 mg po qHS -C/w NTG 0.4 mg SL   Right amputee with phantom limb pain -Resume home meds.   Chronic Diastolic CHF -Not in exacerbation -Continue to Monitor Volume Status Carefully given IVF Rehydration -Strict I's/O's; Daily Weights -Patient is +783 mL   DVT prophylaxis: Enocaparin 40 mg sq q24h Code Status: FULL CODE Family Communication: No Family present at bedside Disposition Plan: Remain Inpatient for current treatment  Consultants:   None   Procedures: None   Antimicrobials:  Anti-infectives (From admission, onward)  Start     Dose/Rate Route Frequency Ordered Stop   06/08/17 1400  cefTRIAXone (ROCEPHIN) 1 g in sodium chloride 0.9 % 100 mL IVPB     1 g 200 mL/hr over 30 Minutes Intravenous Every 24 hours 06/07/17 1522     06/08/17 1400  azithromycin (ZITHROMAX) 500 mg in sodium chloride 0.9 % 250 mL IVPB     500 mg 250 mL/hr over 60 Minutes  Intravenous Every 24 hours 06/07/17 1522     06/07/17 1515  cefTRIAXone (ROCEPHIN) 1 g in sodium chloride 0.9 % 100 mL IVPB     1 g 200 mL/hr over 30 Minutes Intravenous  Once 06/07/17 1512 06/07/17 2052   06/07/17 1515  azithromycin (ZITHROMAX) 500 mg in sodium chloride 0.9 % 250 mL IVPB     500 mg 250 mL/hr over 60 Minutes Intravenous  Once 06/07/17 1512 06/07/17 2227     Subjective: Seen and examined and was coughing dark brown and green sputum with some blood. No CP but SOB and remains on Home O2. No lightheadedness or dizziness.   Objective: Vitals:   06/07/17 2149 06/07/17 2307 06/08/17 0544 06/08/17 1538  BP: (!) 119/57  (!) 112/53 (!) 100/49  Pulse: 84  70 62  Resp: 20  18 18   Temp: 99.8 F (37.7 C)  97.7 F (36.5 C) 98.1 F (36.7 C)  TempSrc: Oral  Oral Oral  SpO2: 95%  96% 95%  Weight: 63.4 kg (139 lb 12.4 oz)     Height:  5\' 3"  (1.6 m)      Intake/Output Summary (Last 24 hours) at 06/08/2017 1609 Last data filed at 06/07/2017 2109 Gross per 24 hour  Intake 783.33 ml  Output -  Net 783.33 ml   Filed Weights   06/07/17 2149  Weight: 63.4 kg (139 lb 12.4 oz)   Examination: Physical Exam:  Constitutional: WN/WD Caucasian female in NAD and appears calm and comfortable and appears younger than stated age Eyes: Lids and conjunctivae normal, sclerae anicteric  ENMT: External Ears, Nose appear normal. Grossly normal hearing. Mucous membranes are moist.  Neck: Appears normal, supple, no cervical masses, normal ROM, no appreciable thyromegaly, no JVD Respiratory: Diminished to auscultation bilaterally with some Rhonchi. No appreciable crackles or rales. Normal respiratory effort and patient is not tachypenic. No accessory muscle use.  Cardiovascular: RRR, no murmurs / rubs / gallops. S1 and S2 auscultated. Trace extremity edema in Left Leg  Abdomen: Soft, non-tender, non-distended. No masses palpated. No appreciable hepatosplenomegaly. Bowel sounds positive.  GU:  Deferred. Musculoskeletal: Right AKA up to Hip Amputee. No cyanosis appreciated Skin: No rashes, lesions, ulcers on a limited skin eval. No induration; Warm and dry.  Neurologic: CN 2-12 grossly intact with no focal deficits. Romberg sign and cerebellar reflexes not assessed.  Psychiatric: Normal judgment and insight. Alert and oriented x 3. Normal mood and appropriate affect.   Data Reviewed: I have personally reviewed following labs and imaging studies  CBC: Recent Labs  Lab 06/07/17 1443 06/08/17 0142  WBC 17.5* 16.3*  NEUTROABS 15.6*  --   HGB 13.1 10.5*  HCT 38.5 31.7*  MCV 93.2 94.6  PLT 176 390*   Basic Metabolic Panel: Recent Labs  Lab 06/07/17 1443 06/08/17 0142  NA 138 140  K 3.8 4.0  CL 102 108  CO2 24 25  GLUCOSE 199* 166*  BUN 22* 22*  CREATININE 0.94 0.90  CALCIUM 8.5* 7.7*   GFR: Estimated Creatinine Clearance: 39.5 mL/min (by C-G formula based  on SCr of 0.9 mg/dL). Liver Function Tests: No results for input(s): AST, ALT, ALKPHOS, BILITOT, PROT, ALBUMIN in the last 168 hours. No results for input(s): LIPASE, AMYLASE in the last 168 hours. No results for input(s): AMMONIA in the last 168 hours. Coagulation Profile: No results for input(s): INR, PROTIME in the last 168 hours. Cardiac Enzymes: No results for input(s): CKTOTAL, CKMB, CKMBINDEX, TROPONINI in the last 168 hours. BNP (last 3 results) No results for input(s): PROBNP in the last 8760 hours. HbA1C: No results for input(s): HGBA1C in the last 72 hours. CBG: No results for input(s): GLUCAP in the last 168 hours. Lipid Profile: No results for input(s): CHOL, HDL, LDLCALC, TRIG, CHOLHDL, LDLDIRECT in the last 72 hours. Thyroid Function Tests: No results for input(s): TSH, T4TOTAL, FREET4, T3FREE, THYROIDAB in the last 72 hours. Anemia Panel: No results for input(s): VITAMINB12, FOLATE, FERRITIN, TIBC, IRON, RETICCTPCT in the last 72 hours. Sepsis Labs: Recent Labs  Lab 06/07/17 1452  06/07/17 1955 06/07/17 2210 06/08/17 0142  LATICACIDVEN 2.37* 2.28* 2.5* 1.5    Recent Results (from the past 240 hour(s))  Culture, sputum-assessment     Status: None   Collection Time: 06/07/17 10:15 AM  Result Value Ref Range Status   Specimen Description EXPECTORATED SPUTUM  Final   Special Requests NONE  Final   Sputum evaluation   Final    THIS SPECIMEN IS ACCEPTABLE FOR SPUTUM CULTURE Performed at [facility], [place] [address] [place], [place] [address]    Report Status 06/08/2017 FINAL  Final  Culture, respiratory (NON-Expectorated)     Status: None (Preliminary result)   Collection Time: 06/07/17 10:15 AM  Result Value Ref Range Status   Specimen Description   Final    EXPECTORATED SPUTUM Performed at [facility] [address], [place], [place] [address]    Special Requests   Final    NONE Reflexed from [phone] Performed at [facility], [place] [address] [place], [place] [address]    Gram Stain   Final    MODERATE WBC PRESENT, PREDOMINANTLY PMN FEW GRAM POSITIVE COCCI FEW GRAM NEGATIVE COCCOBACILLI Performed at [facility], [place] [address] [place], [place] [address]    Culture PENDING  Incomplete   Report Status PENDING  Incomplete  Blood Culture (routine x 2)     Status: None (Preliminary result)   Collection Time: 06/07/17  3:12 PM  Result Value Ref Range Status   Specimen Description   Final    BLOOD LEFT ARM Performed at [facility] [address] [place], [place] [address]    Special Requests   Final    IN PEDIATRIC BOTTLE Blood Culture results may not be optimal due to an excessive volume of blood received in culture bottles Performed at [facility] [address] [place], [place] [address]    Culture   Final    NO GROWTH < 24 HOURS Performed at [facility] [address], [place], [place] [address]    Report Status PENDING  Incomplete   Blood Culture (routine x 2)     Status: None (Preliminary result)   Collection Time: 06/07/17  3:17 PM  Result Value Ref Range Status   Specimen Description   Final    BLOOD RIGHT Performed at [facility] [address] [place], [place] [address]    Special Requests   Final    IN PEDIATRIC BOTTLE Blood Culture  results may not be optimal due to an excessive volume of blood received in culture bottles Performed at [facility] [address] [place], [place] [address]    Culture   Final    NO GROWTH < 24 HOURS Performed at [facility] [address], [place], [place] [address]    Report Status PENDING  Incomplete    Radiology Studies: Dg Chest 2 View  Result Date: 06/07/2017 CLINICAL DATA:  Productive cough for several days with shortness of Breath EXAM: CHEST  2 VIEW COMPARISON:  10/18/2016 FINDINGS: Cardiac shadow is stable. Pacing device is again seen. There is significant right hilar and perihilar density identified which projects into the superior segment of the right lower lobe. These changes are suspicious for underlying mass lesion although could simply be related to lower lobe infiltrate. Some patchy changes in the left base are noted. CT of the chest with contrast is recommended for further evaluation. IMPRESSION: Changes in the right hilum and perihilar region suspicious for underlying mass. Further evaluation by means of CT of the chest is recommended. Electronically Signed   By: Inez Catalina M.D.   On: 06/07/2017 14:39   Ct Chest W Contrast  Result Date: 06/07/2017 CLINICAL DATA:  Per EMS, pt is coming from home with complaints of sob. Pt reports having sob, productive cough, weakness, and burning with urination. Pt has a right aka and uses a motorized scooter. Pt is AO x4. Pt has a hx of a-fib, pacemaker, hypertension, and diabetes. EXAM: CT CHEST WITH CONTRAST TECHNIQUE: Multidetector CT imaging of the chest was performed during  intravenous contrast administration. CONTRAST:  45mL ISOVUE-300 IOPAMIDOL (ISOVUE-300) INJECTION 61% COMPARISON:  Current chest radiograph. FINDINGS: Cardiovascular: Heart is normal size. Mild to moderate three-vessel coronary artery calcifications. No pericardial effusion. Great vessels are normal in caliber. There is aortic atherosclerosis extending into the origins of the left common carotid and left subclavian arteries without significant stenosis. Mediastinum/Nodes: Prominent right neck base lymph node measuring 9 mm in short axis. There are no neck base or axillary masses or pathologically enlarged lymph nodes. There are scattered prominent shotty mediastinal nodes, largest a precarinal node measuring 13 mm in short axis. No discrete mediastinal or hilar mass. Trachea is widely patent. Esophagus is unremarkable. Lungs/Pleura: There are bilateral patchy areas of ground-glass and more confluent airspace opacities. The larger more confluent areas of opacity lie in the lower lobes. Patchy ground-glass opacities are noted bilaterally throughout all lobes. These have a peribronchovascular distribution. There are no discrete masses. There is no evidence of pulmonary edema. Trace pleural effusions are noted bilaterally. No pneumothorax. Upper Abdomen: No acute findings. Gallbladder is distended without wall thickening or inflammation. There is a single visible dependent small gallstone. Musculoskeletal: No fracture or acute finding. No osteoblastic or osteolytic lesions. There are significant degenerative changes along the thoracic spine. IMPRESSION: 1. Bilateral areas of ground-glass and more confluent airspace opacities consistent with multifocal pneumonia. No discrete masses to suggest neoplastic disease. Prominent mediastinal lymph nodes are noted which are presumed reactive. 2. Chronic findings include coronary artery calcifications, aortic atherosclerosis and a single gallstone. Aortic Atherosclerosis  (ICD10-I70.0). Electronically Signed   By: Lajean Manes M.D.   On: 06/07/2017 17:06   Scheduled Meds: . aspirin EC  81 mg Oral QHS  . diltiazem  360 mg Oral Daily  . enoxaparin (LOVENOX) injection  40 mg Subcutaneous Q24H  . estradiol  0.5 mg Oral QHS  . fluticasone  2 spray Each  Nare Daily  . guaiFENesin  1,200 mg Oral BID  . ipratropium-albuterol  3 mL Nebulization Q6H  . latanoprost  1 drop Both Eyes QHS  . levothyroxine  100 mcg Oral QAC breakfast  . metoprolol tartrate  100 mg Oral BID  . mirabegron ER  25 mg Oral QPM  . montelukast  10 mg Oral QHS  . multivitamin  1 tablet Oral Daily  . pantoprazole  40 mg Oral Daily  . pravastatin  40 mg Oral QPM  . pregabalin  300 mg Oral BID   Continuous Infusions: . sodium chloride 75 mL/hr at 06/08/17 1413  . azithromycin 500 mg (06/08/17 1543)  . cefTRIAXone (ROCEPHIN)  IV Stopped (06/08/17 1544)    LOS: 1 day   Kerney Elbe, DO [facility] Pager [phone]  If 7PM-7AM, please contact night-coverage [email or web address] Password [email or web address] 06/08/2017, 4:09 PM

## 2017-06-08 NOTE — Progress Notes (Signed)
CRITICAL VALUE ALERT  Critical Value:  Lactic 2.5  Date & Time Notied:     Provider Notified:    Orders Received/Actions taken: 500cc bolus  Repeat lactic result 1.5  Avaneesh Pepitone K, RN

## 2017-06-08 NOTE — Evaluation (Signed)
Physical Therapy Evaluation Patient Details Name: Kelsey Joseph MRN: [id number] DOB: [birth year] Today's Date: 06/08/2017   History of Present Illness  Kelsey Joseph is a 84 y.o. female with medical history significant ofChronic diastolic heart failure, PAF, hypertension, s/p PPM, presents with sob, associated with productive cough , and gen weakness for 3 days now.  Clinical Impression  Patient presents with decreased independence with mobility due to deficits listed in PT problem list.  She will benefit from skilled PT in the acute setting to allow return home at d/c.  Likely not to need follow up PT.     Follow Up Recommendations No PT follow up    Equipment Recommendations  None recommended by PT    Recommendations for Other Services       Precautions / Restrictions Precautions Precautions: Fall      Mobility  Bed Mobility Overal bed mobility: Modified Independent                Transfers Overall transfer level: Needs assistance   Transfers: Sit to/from Stand;Squat Pivot Transfers Sit to Stand: Min assist   Squat pivot transfers: Min guard     General transfer comment: pivot to recliner minguard for safety, sit to stand to walker min A to hold walker, reports usually leans on wall/furniture when donning clothing  Ambulation/Gait                Stairs            Wheelchair Mobility    Modified Rankin (Stroke Patients Only)       Balance Overall balance assessment: Needs assistance   Sitting balance-Leahy Scale: Good       Standing balance-Leahy Scale: Poor Standing balance comment: can stand briefly on one leg without UE support, but reports usually leans on sturdy objects                             Pertinent Vitals/Pain Pain Assessment: No/denies pain    Home Living Family/patient expects to be discharged to:: Private residence Living Arrangements: Alone Available Help at Discharge: Available  PRN/intermittently;Family Type of Home: Other(Comment)(townhome) Home Access: Level entry     Home Layout: One level Home Equipment: Transport planner;Shower seat;Shower seat - built in;Wheelchair - manual;Crutches      Prior Function Level of Independence: Independent with assistive device(s)         Comments: Uses scooter to get around. Performs squat pivots independently to chair. Exercises regularly at the [facility].     Hand Dominance   Dominant Hand: Right    Extremity/Trunk Assessment   Upper Extremity Assessment Upper Extremity Assessment: Overall WFL for tasks assessed    Lower Extremity Assessment Lower Extremity Assessment: RLE deficits/detail;LLE deficits/detail RLE Deficits / Details: hip disarticulation LLE Deficits / Details: moves WFL, scars on knee from previous surgeries       Communication   Communication: No difficulties  Cognition Arousal/Alertness: Awake/alert Behavior During Therapy: WFL for tasks assessed/performed Overall Cognitive Status: Within Functional Limits for tasks assessed                                        General Comments General comments (skin integrity, edema, etc.): states has prosthetic, but has not worn in a while.  Fitting difficult due to abdominal girth, reports may get a corset to improve fit,  educated to call prosthetist    Exercises     Assessment/Plan    PT Assessment Patient needs continued PT services  PT Problem List Decreased strength;Decreased mobility;Decreased activity tolerance;Decreased balance;Cardiopulmonary status limiting activity       PT Treatment Interventions DME instruction;Therapeutic activities;Therapeutic exercise;Patient/family education;Balance training;Wheelchair mobility training;Functional mobility training    PT Goals (Current goals can be found in the Care Plan section)  Acute Rehab PT Goals Patient Stated Goal: to go home PT Goal Formulation: With patient Time For  Goal Achievement: 06/22/17 Potential to Achieve Goals: Good    Frequency Min 3X/week   Barriers to discharge        Co-evaluation               AM-PAC PT "6 Clicks" Daily Activity  Outcome Measure Difficulty turning over in bed (including adjusting bedclothes, sheets and blankets)?: A Little Difficulty moving from lying on back to sitting on the side of the bed? : A Little Difficulty sitting down on and standing up from a chair with arms (e.g., wheelchair, bedside commode, etc,.)?: Unable Help needed moving to and from a bed to chair (including a wheelchair)?: A Little Help needed walking in hospital room?: Total Help needed climbing 3-5 steps with a railing? : Total 6 Click Score: 12    End of Session Equipment Utilized During Treatment: Gait belt Activity Tolerance: Patient tolerated treatment well Patient left: with call bell/phone within reach;with chair alarm set;in chair   PT Visit Diagnosis: Muscle weakness (generalized) (M62.81);Other symptoms and signs involving the nervous system (R29.898)    Time: 1634-1700 PT Time Calculation (min) (ACUTE ONLY): 26 min   Charges:   PT Evaluation $PT Eval Moderate Complexity: 1 Mod PT Treatments $Therapeutic Activity: 8-22 mins   PT G CodesMagda Kiel, [place] [phone] 06/08/2017   [name] 06/08/2017, 5:37 PM

## 2017-06-08 NOTE — Progress Notes (Signed)
Flutter valve given to pt. Pt knows and understands how to use. 

## 2017-06-09 ENCOUNTER — Inpatient Hospital Stay (HOSPITAL_COMMUNITY): Payer: Medicare Other

## 2017-06-09 DIAGNOSIS — N390 Urinary tract infection, site not specified: Secondary | ICD-10-CM

## 2017-06-09 LAB — RESPIRATORY PANEL BY PCR
ADENOVIRUS-RVPPCR: NOT DETECTED
Bordetella pertussis: NOT DETECTED
CHLAMYDOPHILA PNEUMONIAE-RVPPCR: NOT DETECTED
CORONAVIRUS 229E-RVPPCR: NOT DETECTED
CORONAVIRUS HKU1-RVPPCR: NOT DETECTED
CORONAVIRUS NL63-RVPPCR: NOT DETECTED
CORONAVIRUS OC43-RVPPCR: NOT DETECTED
Influenza A: NOT DETECTED
Influenza B: NOT DETECTED
MYCOPLASMA PNEUMONIAE-RVPPCR: NOT DETECTED
Metapneumovirus: NOT DETECTED
PARAINFLUENZA VIRUS 1-RVPPCR: NOT DETECTED
PARAINFLUENZA VIRUS 4-RVPPCR: NOT DETECTED
Parainfluenza Virus 2: NOT DETECTED
Parainfluenza Virus 3: NOT DETECTED
Respiratory Syncytial Virus: NOT DETECTED
Rhinovirus / Enterovirus: NOT DETECTED

## 2017-06-09 LAB — CBC WITH DIFFERENTIAL/PLATELET
Basophils Absolute: 0 10*3/uL (ref 0.0–0.1)
Basophils Relative: 0 %
Eosinophils Absolute: 0.2 10*3/uL (ref 0.0–0.7)
Eosinophils Relative: 1 %
HCT: 30 % — ABNORMAL LOW (ref 36.0–46.0)
HEMOGLOBIN: 9.9 g/dL — AB (ref 12.0–15.0)
LYMPHS ABS: 1.8 10*3/uL (ref 0.7–4.0)
LYMPHS PCT: 14 %
MCH: 31.6 pg (ref 26.0–34.0)
MCHC: 33 g/dL (ref 30.0–36.0)
MCV: 95.8 fL (ref 78.0–100.0)
MONO ABS: 0.9 10*3/uL (ref 0.1–1.0)
MONOS PCT: 7 %
NEUTROS PCT: 78 %
Neutro Abs: 9.9 10*3/uL — ABNORMAL HIGH (ref 1.7–7.7)
Platelets: 141 10*3/uL — ABNORMAL LOW (ref 150–400)
RBC: 3.13 MIL/uL — ABNORMAL LOW (ref 3.87–5.11)
RDW: 15.3 % (ref 11.5–15.5)
WBC: 12.8 10*3/uL — ABNORMAL HIGH (ref 4.0–10.5)

## 2017-06-09 LAB — LEGIONELLA PNEUMOPHILA SEROGP 1 UR AG: L. pneumophila Serogp 1 Ur Ag: NEGATIVE

## 2017-06-09 MED ORDER — IPRATROPIUM-ALBUTEROL 0.5-2.5 (3) MG/3ML IN SOLN
3.0000 mL | Freq: Four times a day (QID) | RESPIRATORY_TRACT | Status: DC
  Administered 2017-06-10: 3 mL via RESPIRATORY_TRACT
  Filled 2017-06-09: qty 3

## 2017-06-09 MED ORDER — ALBUTEROL SULFATE (2.5 MG/3ML) 0.083% IN NEBU: 2.5000 mg | INHALATION_SOLUTION | Freq: Four times a day (QID) | RESPIRATORY_TRACT | Status: DC | PRN

## 2017-06-09 NOTE — Progress Notes (Signed)
PROGRESS NOTE    Kelsey Joseph  GMW:[id number] DOB: 03-09-1928 DOA: 06/07/2017 PCP: [name], MD   Brief Narrative: Kelsey E Simpsonis a 84 y.o.femalewith medical history significant of Chronic diastolic heart failure, PAF, hypertension, s/p PPM, presents with sob, associated with productive cough , and generalised  weakness . On arrival to ED, she was hypoxic and she required 3 lit of [place] oxygen to keep sats greater than 95%. CXR and CT chest shows bilateral pneumonia.  Labs revealed elevated lactic acid and leukocytosis of 17,500. UA is cloudy and shows many bacteria.Admitted for Bilateral PNA and currently being treated.   Assessment & Plan:   Principal Problem:   Community acquired pneumonia Active Problems:   OBSTRUCTIVE SLEEP APNEA   GERD   Diabetes ([place])   Lower urinary tract infectious disease   Hypothyroid   Phantom limb pain (HCC)   PAF (paroxysmal atrial fibrillation) (HCC)   Hypertension   Pacemaker   Chronic diastolic heart failure (HCC)   Bilateral pneumonia   SOB (shortness of breath)   SSS (sick sinus syndrome) (HCC)  Bilateral community-acquired pneumonia: On azithromycin and ceftriaxone since 06/08/17. Respiratory status improving.  Still needs supplemental oxygen for maintenance of saturation. CT scan showed bilateral areas of groundglass and more confluent airspace opacities consistent with multifocal pneumonia.  Continue bronchodilators.  Blood cultures, sputum cultures no growth till date Streptococcus pneumonia antigen in urine is negative.  Influenza panel negative respiratory virus panel negative. Will repeat chest x-ray today  YQI:HKVQQ culture showed Klebsiella pneumoniae.  We will continue ceftriaxone.  We will monitor the final sensitivity report  SIRS with acute respiratory failure with hypoxia: Secondary to above.  Improving.  Continue gentle IV fluids  Elevated lactic acid: Improved with IV fluids  Hypertension: Continue  metoprolol  History of sick sinus syndrome: Status post pacemaker, on metoprolol and Cardizem.Not on anticoagulation.  Currently in sinus rhythm.  Hypothyroidism: Continue levothyroxine  Hyperlipidemia ;Continue statin  Coronary artery disease: Currently denies any chest pain.  Continue aspirin, metoprolol, statin  Right above-knee amputation: Continue to provide supportive care  Chronic diastolic CHF: Not in exacerbation.  Will monitor her volume status closely    DVT prophylaxis:Lovenox Code Status: Full Family Communication: None present at the bedside Disposition Plan: Home after resolution of pneumonia and UTI   Consultants: None  Procedures: None  Antimicrobials: Ceftriaxone and azithromycin since 06/08/17  Subjective: Patient seen and examined the bedside this morning.  Her respiratory status has improved.  Looks comfortable.  Still requires supplemental oxygen for saturation maintenance  Objective: Vitals:   06/08/17 2247 06/09/17 0522 06/09/17 1011 06/09/17 1412  BP: (!) 125/59 (!) 122/55 (!) 129/58   Pulse: 72 71 74   Resp: 18 20    Temp: 98.5 F (36.9 C) 98.2 F (36.8 C)    TempSrc: Oral Oral    SpO2: 99% 97% 95% 93%  Weight:      Height:        Intake/Output Summary (Last 24 hours) at 06/09/2017 1457 Last data filed at 06/09/2017 1049 Gross per 24 hour  Intake 1070 ml  Output 1000 ml  Net 70 ml   Filed Weights   06/07/17 2149  Weight: 63.4 kg (139 lb 12.4 oz)    Examination:  General exam: Appears calm and comfortable ,Not in distress,average built HEENT:PERRL,Oral mucosa moist, Ear/Nose normal on gross exam Respiratory system: Bilateral decreased air entry, bilateral rhonchi, bilateral crackles  cardiovascular system: S1 & S2 heard, RRR. No JVD, murmurs, rubs,  gallops or clicks. No pedal edema. Gastrointestinal system: Abdomen is nondistended, soft and nontender. No organomegaly or masses felt. Normal bowel sounds heard. Central nervous system:  Alert and oriented. No focal neurological deficits. Extremities: No edema, no clubbing ,no cyanosis, distal peripheral pulses palpable. AkA on the right  Skin: No rashes, lesions or ulcers,no icterus ,no pallor MSK: Normal muscle bulk,tone ,power Psychiatry: Judgement and insight appear normal. Mood & affect appropriate.     Data Reviewed: I have personally reviewed following labs and imaging studies  CBC: Recent Labs  Lab 06/07/17 1443 06/08/17 0142 06/09/17 0903  WBC 17.5* 16.3* 12.8*  NEUTROABS 15.6*  --  9.9*  HGB 13.1 10.5* 9.9*  HCT 38.5 31.7* 30.0*  MCV 93.2 94.6 95.8  PLT 176 143* 250*   Basic Metabolic Panel: Recent Labs  Lab 06/07/17 1443 06/08/17 0142  NA 138 140  K 3.8 4.0  CL 102 108  CO2 24 25  GLUCOSE 199* 166*  BUN 22* 22*  CREATININE 0.94 0.90  CALCIUM 8.5* 7.7*   GFR: Estimated Creatinine Clearance: 39.5 mL/min (by C-G formula based on SCr of 0.9 mg/dL). Liver Function Tests: No results for input(s): AST, ALT, ALKPHOS, BILITOT, PROT, ALBUMIN in the last 168 hours. No results for input(s): LIPASE, AMYLASE in the last 168 hours. No results for input(s): AMMONIA in the last 168 hours. Coagulation Profile: No results for input(s): INR, PROTIME in the last 168 hours. Cardiac Enzymes: No results for input(s): CKTOTAL, CKMB, CKMBINDEX, TROPONINI in the last 168 hours. BNP (last 3 results) No results for input(s): PROBNP in the last 8760 hours. HbA1C: No results for input(s): HGBA1C in the last 72 hours. CBG: Recent Labs  Lab 06/08/17 2352  GLUCAP 142*   Lipid Profile: No results for input(s): CHOL, HDL, LDLCALC, TRIG, CHOLHDL, LDLDIRECT in the last 72 hours. Thyroid Function Tests: No results for input(s): TSH, T4TOTAL, FREET4, T3FREE, THYROIDAB in the last 72 hours. Anemia Panel: No results for input(s): VITAMINB12, FOLATE, FERRITIN, TIBC, IRON, RETICCTPCT in the last 72 hours. Sepsis Labs: Recent Labs  Lab 06/07/17 1452 06/07/17 1955  06/07/17 2210 06/08/17 0142  LATICACIDVEN 2.37* 2.28* 2.5* 1.5    Recent Results (from the past 240 hour(s))  Culture, sputum-assessment     Status: None   Collection Time: 06/07/17 10:15 AM  Result Value Ref Range Status   Specimen Description EXPECTORATED SPUTUM  Final   Special Requests NONE  Final   Sputum evaluation   Final    THIS SPECIMEN IS ACCEPTABLE FOR SPUTUM CULTURE Performed at [facility], [place] [address] [place], [place] [address]    Report Status 06/08/2017 FINAL  Final  Culture, respiratory (NON-Expectorated)     Status: None (Preliminary result)   Collection Time: 06/07/17 10:15 AM  Result Value Ref Range Status   Specimen Description   Final    EXPECTORATED SPUTUM Performed at [facility] [address] [place], [place] [address]    Special Requests   Final    NONE Reflexed from [id number] Performed at [facility], [place] [address] [place], [place] [address]    Gram Stain   Final    MODERATE WBC PRESENT, PREDOMINANTLY PMN FEW GRAM POSITIVE COCCI FEW GRAM NEGATIVE COCCOBACILLI    Culture   Final    CULTURE REINCUBATED FOR BETTER GROWTH Performed at [facility], [place] [address] [place], [place] [address]    Report Status PENDING  Incomplete  Blood Culture (routine x  2)     Status: None (Preliminary result)   Collection Time: 06/07/17  3:12 PM  Result Value Ref Range Status   Specimen Description   Final    BLOOD LEFT ARM Performed at [facility] [address] [place], [place] [address]    Special Requests   Final    IN PEDIATRIC BOTTLE Blood Culture results may not be optimal due to an excessive volume of blood received in culture bottles Performed at [facility] [address] [place], [place] [address]    Culture   Final    NO GROWTH 2 DAYS Performed at [facility] [address], [place], [place] [address]    Report Status  PENDING  Incomplete  Blood Culture (routine x 2)     Status: None (Preliminary result)   Collection Time: 06/07/17  3:17 PM  Result Value Ref Range Status   Specimen Description   Final    BLOOD RIGHT Performed at [facility] [address] [place], [place] [address]    Special Requests   Final    IN PEDIATRIC BOTTLE Blood Culture results may not be optimal due to an excessive volume of blood received in culture bottles Performed at [facility] [address] [place], [place] [address]    Culture   Final    NO GROWTH 2 DAYS Performed at [facility] [address], [place], [place] [address]    Report Status PENDING  Incomplete  Urine culture     Status: Abnormal (Preliminary result)   Collection Time: 06/07/17  7:30 PM  Result Value Ref Range Status   Specimen Description   Final    URINE, RANDOM Performed at [facility] [address], [place], [place] [address]    Special Requests   Final    NONE Performed at [facility], [place] [address], [place], [place] [address]    Culture >=100,000 COLONIES/mL KLEBSIELLA PNEUMONIAE (A)  Final   Report Status PENDING  Incomplete  Respiratory Panel by PCR     Status: None   Collection Time: 06/09/17  5:41 AM  Result Value Ref Range Status   Adenovirus NOT DETECTED NOT DETECTED Final   Coronavirus 229E NOT DETECTED NOT DETECTED Final   Coronavirus HKU1 NOT DETECTED NOT DETECTED Final   Coronavirus NL63 NOT DETECTED NOT DETECTED Final   Coronavirus OC43 NOT DETECTED NOT DETECTED Final   Metapneumovirus NOT DETECTED NOT DETECTED Final   Rhinovirus / Enterovirus NOT DETECTED NOT DETECTED Final   Influenza A NOT DETECTED NOT DETECTED Final   Influenza B NOT DETECTED NOT DETECTED Final   Parainfluenza Virus 1 NOT DETECTED NOT DETECTED Final   Parainfluenza Virus 2 NOT DETECTED NOT DETECTED Final   Parainfluenza Virus 3 NOT DETECTED NOT DETECTED Final    Parainfluenza Virus 4 NOT DETECTED NOT DETECTED Final   Respiratory Syncytial Virus NOT DETECTED NOT DETECTED Final   Bordetella pertussis NOT DETECTED NOT DETECTED Final   Chlamydophila pneumoniae NOT DETECTED NOT DETECTED Final   Mycoplasma pneumoniae NOT DETECTED NOT DETECTED Final         Radiology Studies: Ct Chest W Contrast  Result Date: 06/07/2017 CLINICAL DATA:  Per EMS, pt is coming from home with complaints of sob. Pt reports having sob, productive cough, weakness, and burning with urination. Pt has a right aka and uses a motorized scooter. Pt is AO x4. Pt has a hx  of a-fib, pacemaker, hypertension, and diabetes. EXAM: CT CHEST WITH CONTRAST TECHNIQUE: Multidetector CT imaging of the chest was performed during intravenous contrast administration. CONTRAST:  11mL ISOVUE-300 IOPAMIDOL (ISOVUE-300) INJECTION 61% COMPARISON:  Current chest radiograph. FINDINGS: Cardiovascular: Heart is normal size. Mild to moderate three-vessel coronary artery calcifications. No pericardial effusion. Great vessels are normal in caliber. There is aortic atherosclerosis extending into the origins of the left common carotid and left subclavian arteries without significant stenosis. Mediastinum/Nodes: Prominent right neck base lymph node measuring 9 mm in short axis. There are no neck base or axillary masses or pathologically enlarged lymph nodes. There are scattered prominent shotty mediastinal nodes, largest a precarinal node measuring 13 mm in short axis. No discrete mediastinal or hilar mass. Trachea is widely patent. Esophagus is unremarkable. Lungs/Pleura: There are bilateral patchy areas of ground-glass and more confluent airspace opacities. The larger more confluent areas of opacity lie in the lower lobes. Patchy ground-glass opacities are noted bilaterally throughout all lobes. These have a peribronchovascular distribution. There are no discrete masses. There is no evidence of pulmonary edema. Trace  pleural effusions are noted bilaterally. No pneumothorax. Upper Abdomen: No acute findings. Gallbladder is distended without wall thickening or inflammation. There is a single visible dependent small gallstone. Musculoskeletal: No fracture or acute finding. No osteoblastic or osteolytic lesions. There are significant degenerative changes along the thoracic spine. IMPRESSION: 1. Bilateral areas of ground-glass and more confluent airspace opacities consistent with multifocal pneumonia. No discrete masses to suggest neoplastic disease. Prominent mediastinal lymph nodes are noted which are presumed reactive. 2. Chronic findings include coronary artery calcifications, aortic atherosclerosis and a single gallstone. Aortic Atherosclerosis (ICD10-I70.0). Electronically Signed   By: Lajean Manes M.D.   On: 06/07/2017 17:06        Scheduled Meds: . aspirin EC  81 mg Oral QHS  . diltiazem  360 mg Oral Daily  . enoxaparin (LOVENOX) injection  40 mg Subcutaneous Q24H  . estradiol  0.5 mg Oral QHS  . fluticasone  2 spray Each Nare Daily  . guaiFENesin  1,200 mg Oral BID  . ipratropium-albuterol  3 mL Nebulization TID  . latanoprost  1 drop Both Eyes QHS  . levothyroxine  100 mcg Oral QAC breakfast  . metoprolol tartrate  100 mg Oral BID  . mirabegron ER  25 mg Oral QPM  . montelukast  10 mg Oral QHS  . multivitamin  1 tablet Oral Daily  . pantoprazole  40 mg Oral Daily  . pravastatin  40 mg Oral QPM  . pregabalin  300 mg Oral BID   Continuous Infusions: . sodium chloride 75 mL/hr at 06/09/17 0519  . azithromycin Stopped (06/09/17 1438)  . cefTRIAXone (ROCEPHIN)  IV Stopped (06/09/17 1517)     LOS: 2 days    Time spent: 25 mins.More than 50% of that time was spent in counseling and/or coordination of care.      Marene Lenz, MD [facility] Pager [phone]  If 7PM-7AM, please contact night-coverage [email or web address] Password [email or web address] 06/09/2017, 2:57 PM

## 2017-06-09 NOTE — Evaluation (Signed)
Occupational Therapy Evaluation Patient Details Name: Kelsey Joseph MRN: [id number] DOB: February 09, 1928 Today's Date: 06/09/2017    History of Present Illness Loriann SHAILEY BUTTERBAUGH is a 84 y.o. female with medical history significant ofChronic diastolic heart failure, PAF, hypertension, s/p PPM, presents with sob, associated with productive cough , and gen weakness for 3 days now.   Clinical Impression   This 84 y/o F presents with the above. Pt lives alone, at baseline is mod independent with ADLs and functional mobility using motor scooter for majority of mobility. Pt completing squat pivot transfer EOB to recliner with MinA this session; currently requires minGuard-MinA for LB ADLs, presenting with generalized weakness and decreased activity tolerance. Pt will benefit from continued acute OT services to maximize her overall strength, endurance, safety and independence with ADLs and mobility prior to return home.     Follow Up Recommendations  No OT follow up;Supervision - Intermittent    Equipment Recommendations  None recommended by OT           Precautions / Restrictions Precautions Precautions: Fall Restrictions Weight Bearing Restrictions: No      Mobility Bed Mobility Overal bed mobility: Modified Independent                Transfers Overall transfer level: Needs assistance   Transfers: Squat Pivot Transfers     Squat pivot transfers: Min assist     General transfer comment: minA for squat pivot EOB to recliner to the R     Balance Overall balance assessment: Needs assistance   Sitting balance-Leahy Scale: Good                                     ADL either performed or assessed with clinical judgement   ADL Overall ADL's : Needs assistance/impaired Eating/Feeding: Sitting;Modified independent   Grooming: Set up;Sitting   Upper Body Bathing: Supervision/ safety;Sitting   Lower Body Bathing: Min guard;Sitting/lateral leans   Upper Body  Dressing : Set up;Sitting   Lower Body Dressing: Min guard;Sitting/lateral leans   Toilet Transfer: Minimal assistance;Squat-pivot;BSC Toilet Transfer Details (indicate cue type and reason): simulated in transfer EOB>recliner  Toileting- Clothing Manipulation and Hygiene: Min guard;Sitting/lateral lean       Functional mobility during ADLs: Minimal assistance(for squat pivot transfer ) General ADL Comments: pt reports increased weakness/decreased activity tolerance compared to baseline                          Pertinent Vitals/Pain Pain Assessment: No/denies pain     Hand Dominance Right   Extremity/Trunk Assessment Upper Extremity Assessment Upper Extremity Assessment: Overall WFL for tasks assessed   Lower Extremity Assessment Lower Extremity Assessment: Defer to PT evaluation       Communication Communication Communication: No difficulties   Cognition Arousal/Alertness: Awake/alert Behavior During Therapy: WFL for tasks assessed/performed Overall Cognitive Status: Within Functional Limits for tasks assessed                                                      Home Living Family/patient expects to be discharged to:: Private residence Living Arrangements: Alone Available Help at Discharge: Available PRN/intermittently;Family Type of Home: Other(Comment)(townhome) Home Access: Level entry     Home Layout:  One level     Bathroom Shower/Tub: Occupational psychologist: Handicapped height     Home Equipment: Transport planner;Shower seat;Shower seat - built in;Wheelchair - manual;Crutches          Prior Functioning/Environment Level of Independence: Independent with assistive device(s)        Comments: Uses scooter to get around. Performs squat pivots independently to chair. Exercises regularly at the [facility].        OT Problem List: Decreased strength;Decreased activity tolerance;Cardiopulmonary status limiting  activity      OT Treatment/Interventions: Self-care/ADL training;DME and/or AE instruction;Therapeutic activities;Balance training;Therapeutic exercise;Energy conservation;Patient/family education    OT Goals(Current goals can be found in the care plan section) Acute Rehab OT Goals Patient Stated Goal: to go home OT Goal Formulation: With patient Time For Goal Achievement: 06/23/17 Potential to Achieve Goals: Good  OT Frequency: Min 2X/week                             AM-PAC PT "6 Clicks" Daily Activity     Outcome Measure Help from another person eating meals?: None Help from another person taking care of personal grooming?: None Help from another person toileting, which includes using toliet, bedpan, or urinal?: A Little Help from another person bathing (including washing, rinsing, drying)?: A Little Help from another person to put on and taking off regular upper body clothing?: None Help from another person to put on and taking off regular lower body clothing?: A Little 6 Click Score: 21   End of Session Equipment Utilized During Treatment: Oxygen Nurse Communication: Mobility status  Activity Tolerance: Patient tolerated treatment well Patient left: in chair;with call bell/phone within reach;with chair alarm set  OT Visit Diagnosis: Muscle weakness (generalized) (M62.81)                Time: 7412-8786 OT Time Calculation (min): 15 min Charges:  OT General Charges $OT Visit: 1 Visit OT Evaluation $OT Eval Moderate Complexity: 1 Mod G-Codes:     Lou Cal, OT Pager [phone] 06/09/2017   Raymondo Band 06/09/2017, 1:29 PM

## 2017-06-10 DIAGNOSIS — I4891 Unspecified atrial fibrillation: Secondary | ICD-10-CM | POA: Diagnosis not present

## 2017-06-10 DIAGNOSIS — D539 Nutritional anemia, unspecified: Secondary | ICD-10-CM | POA: Diagnosis not present

## 2017-06-10 DIAGNOSIS — E7800 Pure hypercholesterolemia, unspecified: Secondary | ICD-10-CM | POA: Diagnosis not present

## 2017-06-10 DIAGNOSIS — E119 Type 2 diabetes mellitus without complications: Secondary | ICD-10-CM | POA: Diagnosis not present

## 2017-06-10 DIAGNOSIS — I252 Old myocardial infarction: Secondary | ICD-10-CM | POA: Diagnosis not present

## 2017-06-10 DIAGNOSIS — I10 Essential (primary) hypertension: Secondary | ICD-10-CM | POA: Diagnosis not present

## 2017-06-10 DIAGNOSIS — D509 Iron deficiency anemia, unspecified: Secondary | ICD-10-CM | POA: Diagnosis not present

## 2017-06-10 LAB — CBC WITH DIFFERENTIAL/PLATELET
BASOS ABS: 0 10*3/uL (ref 0.0–0.1)
Basophils Relative: 0 %
Eosinophils Absolute: 0.2 10*3/uL (ref 0.0–0.7)
Eosinophils Relative: 2 %
HEMATOCRIT: 30.7 % — AB (ref 36.0–46.0)
Hemoglobin: 10 g/dL — ABNORMAL LOW (ref 12.0–15.0)
LYMPHS PCT: 21 %
Lymphs Abs: 2 10*3/uL (ref 0.7–4.0)
MCH: 30.9 pg (ref 26.0–34.0)
MCHC: 32.6 g/dL (ref 30.0–36.0)
MCV: 94.8 fL (ref 78.0–100.0)
MONOS PCT: 11 %
Monocytes Absolute: 1 10*3/uL (ref 0.1–1.0)
NEUTROS ABS: 6.3 10*3/uL (ref 1.7–7.7)
Neutrophils Relative %: 66 %
Platelets: 145 10*3/uL — ABNORMAL LOW (ref 150–400)
RBC: 3.24 MIL/uL — ABNORMAL LOW (ref 3.87–5.11)
RDW: 15.1 % (ref 11.5–15.5)
WBC: 9.5 10*3/uL (ref 4.0–10.5)

## 2017-06-10 LAB — BASIC METABOLIC PANEL
ANION GAP: 6 (ref 5–15)
BUN: 22 mg/dL — ABNORMAL HIGH (ref 6–20)
CALCIUM: 8.3 mg/dL — AB (ref 8.9–10.3)
CO2: 22 mmol/L (ref 22–32)
Chloride: 110 mmol/L (ref 101–111)
Creatinine, Ser: 0.77 mg/dL (ref 0.44–1.00)
GFR calc Af Amer: >60 mL/min (ref >60)
GFR calc non Af Amer: >60 mL/min (ref >60)
GLUCOSE: 156 mg/dL — AB (ref 65–99)
Potassium: 4.4 mmol/L (ref 3.5–5.1)
Sodium: 138 mmol/L (ref 135–145)

## 2017-06-10 LAB — URINE CULTURE: Culture: >=100000 — AB

## 2017-06-10 MED ORDER — IPRATROPIUM-ALBUTEROL 0.5-2.5 (3) MG/3ML IN SOLN
3.0000 mL | Freq: Three times a day (TID) | RESPIRATORY_TRACT | Status: DC
  Administered 2017-06-10 – 2017-06-13 (×9): 3 mL via RESPIRATORY_TRACT
  Filled 2017-06-10 (×9): qty 3

## 2017-06-10 MED ORDER — CEFPODOXIME PROXETIL 200 MG PO TABS
200.0000 mg | ORAL_TABLET | Freq: Two times a day (BID) | ORAL | Status: DC
  Filled 2017-06-10: qty 1

## 2017-06-10 MED ORDER — AZITHROMYCIN 250 MG PO TABS: 500.0000 mg | ORAL_TABLET | Freq: Every day | ORAL | Status: DC

## 2017-06-10 MED ORDER — TORSEMIDE 20 MG PO TABS
30.0000 mg | ORAL_TABLET | Freq: Every day | ORAL | Status: DC
  Administered 2017-06-10 – 2017-06-13 (×4): 30 mg via ORAL
  Filled 2017-06-10 (×4): qty 1

## 2017-06-10 MED ORDER — CEFDINIR 300 MG PO CAPS: 300.0000 mg | ORAL_CAPSULE | Freq: Two times a day (BID) | ORAL | Status: DC

## 2017-06-10 NOTE — Care Management Important Message (Signed)
Important Message  Patient Details  Name: Kelsey Joseph MRN: [id number] Date of Birth: 1927/06/14   Medicare Important Message Given:  Yes    Kerin Salen 06/10/2017, 10:56 AMImportant Message  Patient Details  Name: Kelsey Joseph MRN: [id number] Date of Birth: [birth year]   Medicare Important Message Given:  Yes    Kerin Salen 06/10/2017, 10:56 AM

## 2017-06-10 NOTE — Progress Notes (Signed)
Occupational Therapy Treatment Patient Details Name: Kelsey Joseph MRN: [id number] DOB: February 19, 1928 Today's Date: 06/10/2017    History of present illness Kelsey Joseph is a 84 y.o. female with medical history significant ofChronic diastolic heart failure, PAF, hypertension, s/p PPM, presents with sob, associated with productive cough , and gen weakness for 3 days now.   OT comments  Pt seen for ADL retraining session today with focus on functional transfers & LB ADL's in preparation for increased independence/participation in ADL's and d/c home alone with intermittent supervision/assist. Pt is overall Min guard assist for squat pivot transfer and Min A LB dressing. Pt daughter notes that PTA, pt is SOB at times during conversations and with limited activity & wondering if pt needs home O2. Pt was on 1L O2 throughout session today and was not with noted increased SOB as described, will cont to monitor.   Follow Up Recommendations  No OT follow up;Supervision - Intermittent    Equipment Recommendations  None recommended by OT    Recommendations for Other Services      Precautions / Restrictions Precautions Precautions: Fall Restrictions Weight Bearing Restrictions: No       Mobility Bed Mobility Overal bed mobility: Modified Independent             General bed mobility comments: Increased time, no physical assist  Transfers Overall transfer level: Needs assistance   Transfers: Squat Pivot Transfers     Squat pivot transfers: Min guard     General transfer comment: Min guard for squat pivot from EOB to chair to the right (Armrest lowered).    Balance Overall balance assessment: Needs assistance   Sitting balance-Leahy Scale: Good                                     ADL either performed or assessed with clinical judgement   ADL Overall ADL's : Needs assistance/impaired Eating/Feeding: Sitting;Modified independent                   Lower  Body Dressing: Min guard;Sitting/lateral leans Lower Body Dressing Details (indicate cue type and reason): [facility] mesh underwear sitting up in chair Toilet Transfer: Min Contractor Details (indicate cue type and reason): Simulated toilet transfer EOB to chair. Armrest on chair lowered Toileting- Clothing Manipulation and Hygiene: Sitting/lateral lean;Min guard         General ADL Comments: Pt reports that she is feeling overall weakness and decreased activitiy tolerance today, but agreeable to functional ADL transfer from bed to chair to sit up for lunch and LB dressing techniques.      Vision Patient Visual Report: No change from baseline     Perception     Praxis      Cognition Arousal/Alertness: Awake/alert Behavior During Therapy: WFL for tasks assessed/performed Overall Cognitive Status: Within Functional Limits for tasks assessed                                          Exercises     Shoulder Instructions       General Comments      Pertinent Vitals/ Pain       Pain Assessment: No/denies pain  Home Living  Prior Functioning/Environment              Frequency  Min 2X/week        Progress Toward Goals  OT Goals(current goals can now be found in the care plan section)  Progress towards OT goals: Progressing toward goals     Plan Discharge plan remains appropriate    Co-evaluation                 AM-PAC PT "6 Clicks" Daily Activity     Outcome Measure   Help from another person eating meals?: None Help from another person taking care of personal grooming?: None Help from another person toileting, which includes using toliet, bedpan, or urinal?: A Little Help from another person bathing (including washing, rinsing, drying)?: A Little Help from another person to put on and taking off regular upper body clothing?: None Help  from another person to put on and taking off regular lower body clothing?: A Little 6 Click Score: 21    End of Session Equipment Utilized During Treatment: Oxygen  OT Visit Diagnosis: Muscle weakness (generalized) (M62.81)   Activity Tolerance Patient tolerated treatment well   Patient Left in chair;with call bell/phone within reach;with chair alarm set;with family/visitor present;with nursing/sitter in room   Nurse Communication Mobility status;Other (comment)(Pt requests assist back to bed after lunch today so she is not up "all afternoon")        Time: 1101-1135 OT Time Calculation (min): 34 min  Charges: OT General Charges $OT Visit: 1 Visit OT Treatments $Self Care/Home Management : 23-37 mins    Huntley Knoop Beth Dixon , OTR/L 06/10/2017, 11:43 AM

## 2017-06-10 NOTE — Progress Notes (Signed)
PROGRESS NOTE    Kelsey Joseph  LZJ:[id number] DOB: 27-Feb-1928 DOA: 06/07/2017 PCP: [name], MD   Brief Narrative: Kelsey Joseph a 84 y.o.femalewith medical history significant of Chronic diastolic heart failure, PAF, hypertension, s/p PPM, presents with sob, associated with productive cough , and generalised  weakness . On arrival to ED, she was hypoxic and she required 3 lit of [place] oxygen to keep sats greater than 95%. CXR and CT chest shows bilateral pneumonia.  Labs revealed elevated lactic acid and leukocytosis of 17,500. UA was cloudy and showed  many bacteria.Admitted for Bilateral PNA and currently being treated for that.  Currently respiratory status has improved.   Assessment & Plan:   Principal Problem:   Community acquired pneumonia Active Problems:   OBSTRUCTIVE SLEEP APNEA   GERD   Diabetes ([place])   Lower urinary tract infectious disease   Hypothyroid   Phantom limb pain (HCC)   PAF (paroxysmal atrial fibrillation) (HCC)   Hypertension   Pacemaker   Chronic diastolic heart failure (HCC)   Bilateral pneumonia   SOB (shortness of breath)   SSS (sick sinus syndrome) (HCC)  Bilateral community-acquired pneumonia: On azithromycin and ceftriaxone since 06/08/17. Respiratory status improving.  Still needs supplemental oxygen for maintenance of saturation.We will check her sats without oxygen. CT scan showed bilateral areas of groundglass and more confluent airspace opacities consistent with multifocal pneumonia.  Continue bronchodilators.  Blood cultures, sputum cultures no growth till date Streptococcus pneumonia antigen in urine is negative.  Influenza panel negative respiratory virus panel negative. Repeat chest x-ray on 06/09/17 showed bilateral perihilar opacities ,concerning for edema or inflammation.  [name].  We will continue ceftriaxone.  SIRS with acute respiratory failure with hypoxia: Secondary to above.   Improving.  Continue gentle IV fluids  Elevated lactic acid: Improved with IV fluids  Hypertension: Continue metoprolol  History of sick sinus syndrome: Status post pacemaker, on metoprolol and Cardizem.Not on anticoagulation.  Currently in sinus rhythm.  Hypothyroidism: Continue levothyroxine  Hyperlipidemia :Continue statin  Coronary artery disease: Currently denies any chest pain.  Continue aspirin, metoprolol, statin  Right above-knee amputation: Continue to provide supportive care  Chronic diastolic CHF: Not in exacerbation.  Will monitor her volume status closely.Will resume torsemide. IV fluids will be stopped.    DVT prophylaxis:Lovenox Code Status: Full Family Communication: None present at the bedside Disposition Plan: Home after resolution of pneumonia and UTI.Likely tomorrow   Consultants: None  Procedures: None  Antimicrobials: Ceftriaxone and azithromycin since 06/08/17  Subjective: Patient seen and examined the bedside this morning.  Remains comfortable.  Still complaining of some shortness of breath and cough.  Respiratory status has improved  Objective: Vitals:   06/09/17 2056 06/09/17 2230 06/10/17 0544 06/10/17 0805  BP:  (!) 131/52 (!) 100/49   Pulse:  73 61   Resp:  18 16   Temp:  97.9 F (36.6 C) 98 F (36.7 C)   TempSrc:  Oral Oral   SpO2: 95% 98% 96% 93%  Weight:      Height:        Intake/Output Summary (Last 24 hours) at 06/10/2017 1253 Last data filed at 06/10/2017 1110 Gross per 24 hour  Intake 2755 ml  Output 1100 ml  Net 1655 ml   Filed Weights   06/07/17 2149  Weight: 63.4 kg (139 lb 12.4 oz)    Examination:  General exam: Appears calm and comfortable ,Not in distress,average built HEENT:PERRL,Oral mucosa moist, Ear/Nose normal on  gross exam Respiratory system: Bilateral mildly decreased air entry, bilateral occasional rhonchi  cardiovascular system: S1 & S2 heard, RRR. No JVD, murmurs, rubs, gallops or  clicks. Gastrointestinal system: Abdomen is nondistended, soft and nontender. No organomegaly or masses felt. Normal bowel sounds heard. Central nervous system: Alert and oriented. No focal neurological deficits. Extremities: No edema, no clubbing ,no cyanosis, distal peripheral pulses palpable,right AKA Skin: No rashes, lesions or ulcers,no icterus ,no pallor MSK: Normal muscle bulk,tone ,power Psychiatry: Judgement and insight appear normal. Mood & affect appropriate.      Data Reviewed: I have personally reviewed following labs and imaging studies  CBC: Recent Labs  Lab 06/07/17 1443 06/08/17 0142 06/09/17 0903 06/10/17 0442  WBC 17.5* 16.3* 12.8* 9.5  NEUTROABS 15.6*  --  9.9* 6.3  HGB 13.1 10.5* 9.9* 10.0*  HCT 38.5 31.7* 30.0* 30.7*  MCV 93.2 94.6 95.8 94.8  PLT 176 143* 141* 400*   Basic Metabolic Panel: Recent Labs  Lab 06/07/17 1443 06/08/17 0142 06/10/17 0442  NA 138 140 138  K 3.8 4.0 4.4  CL 102 108 110  CO2 24 25 22   GLUCOSE 199* 166* 156*  BUN 22* 22* 22*  CREATININE 0.94 0.90 0.77  CALCIUM 8.5* 7.7* 8.3*   GFR: Estimated Creatinine Clearance: 43.6 mL/min (by C-G formula based on SCr of 0.77 mg/dL). Liver Function Tests: No results for input(s): AST, ALT, ALKPHOS, BILITOT, PROT, ALBUMIN in the last 168 hours. No results for input(s): LIPASE, AMYLASE in the last 168 hours. No results for input(s): AMMONIA in the last 168 hours. Coagulation Profile: No results for input(s): INR, PROTIME in the last 168 hours. Cardiac Enzymes: No results for input(s): CKTOTAL, CKMB, CKMBINDEX, TROPONINI in the last 168 hours. BNP (last 3 results) No results for input(s): PROBNP in the last 8760 hours. HbA1C: No results for input(s): HGBA1C in the last 72 hours. CBG: Recent Labs  Lab 06/08/17 2352  GLUCAP 142*   Lipid Profile: No results for input(s): CHOL, HDL, LDLCALC, TRIG, CHOLHDL, LDLDIRECT in the last 72 hours. Thyroid Function Tests: No results for  input(s): TSH, T4TOTAL, FREET4, T3FREE, THYROIDAB in the last 72 hours. Anemia Panel: No results for input(s): VITAMINB12, FOLATE, FERRITIN, TIBC, IRON, RETICCTPCT in the last 72 hours. Sepsis Labs: Recent Labs  Lab 06/07/17 1452 06/07/17 1955 06/07/17 2210 06/08/17 0142  LATICACIDVEN 2.37* 2.28* 2.5* 1.5    Recent Results (from the past 240 hour(s))  Culture, sputum-assessment     Status: None   Collection Time: 06/07/17 10:15 AM  Result Value Ref Range Status   Specimen Description EXPECTORATED SPUTUM  Final   Special Requests NONE  Final   Sputum evaluation   Final    THIS SPECIMEN IS ACCEPTABLE FOR SPUTUM CULTURE Performed at [facility], [place] [address] [place], [place] [address]    Report Status 06/08/2017 FINAL  Final  Culture, respiratory (NON-Expectorated)     Status: None (Preliminary result)   Collection Time: 06/07/17 10:15 AM  Result Value Ref Range Status   Specimen Description   Final    EXPECTORATED SPUTUM Performed at [facility] [address], [place], [place] [address]    Special Requests   Final    NONE Reflexed from [id number] Performed at [facility], [place] [address] [place], [place] [address]    Gram Stain   Final    MODERATE WBC PRESENT, PREDOMINANTLY PMN FEW GRAM POSITIVE COCCI FEW GRAM NEGATIVE COCCOBACILLI    Culture  Final    Consistent with normal respiratory flora. Performed at [facility], [place] [address] [place], [place] [address]    Report Status PENDING  Incomplete  Blood Culture (routine x 2)     Status: None (Preliminary result)   Collection Time: 06/07/17  3:12 PM  Result Value Ref Range Status   Specimen Description   Final    BLOOD LEFT ARM Performed at [facility] [address] [place], [place] [address]    Special Requests   Final    IN PEDIATRIC BOTTLE Blood Culture results may not be optimal due to an excessive volume of blood  received in culture bottles Performed at [facility] [address] [place], [place] [address]    Culture   Final    NO GROWTH 3 DAYS Performed at [facility], [place] [address] [place], [place] [address]    Report Status PENDING  Incomplete  Blood Culture (routine x 2)     Status: None (Preliminary result)   Collection Time: 06/07/17  3:17 PM  Result Value Ref Range Status   Specimen Description   Final    BLOOD RIGHT Performed at [facility] [address], [place], [place] [address]    Special Requests   Final    IN PEDIATRIC BOTTLE Blood Culture results may not be optimal due to an excessive volume of blood received in culture bottles Performed at [facility] [address], [place], [place] [address]    Culture   Final    NO GROWTH 3 DAYS Performed at [facility], [place] [address], [place], [place] [address]    Report Status PENDING  Incomplete  Urine culture     Status: Abnormal   Collection Time: 06/07/17  7:30 PM  Result Value Ref Range Status   Specimen Description   Final    URINE, RANDOM Performed at [facility] [address] [place], [place] [address]    Special Requests   Final    NONE Performed at [facility], [place] [address], [place], [place] [address]    Culture (A)  Final    >=100,000 COLONIES/mL KLEBSIELLA PNEUMONIAE >=100,000 COLONIES/mL AEROCOCCUS URINAE Standardized susceptibility testing for this organism is not available. Performed at [facility], [place] [address] [place], [place] [address]    Report Status 06/10/2017 FINAL  Final   Organism ID, Bacteria KLEBSIELLA PNEUMONIAE (A)  Final      Susceptibility   Klebsiella pneumoniae - MIC*    AMPICILLIN >=32 RESISTANT Resistant     CEFAZOLIN <=4 SENSITIVE Sensitive     CEFTRIAXONE <=1 SENSITIVE Sensitive     CIPROFLOXACIN <=0.25 SENSITIVE Sensitive     GENTAMICIN <=1 SENSITIVE  Sensitive     IMIPENEM <=0.25 SENSITIVE Sensitive     NITROFURANTOIN 32 SENSITIVE Sensitive     TRIMETH/SULFA <=20 SENSITIVE Sensitive     AMPICILLIN/SULBACTAM 8 SENSITIVE Sensitive     PIP/TAZO <=4 SENSITIVE Sensitive     Extended ESBL NEGATIVE Sensitive     * >=100,000 COLONIES/mL KLEBSIELLA PNEUMONIAE  Respiratory Panel by PCR     Status: None   Collection Time: 06/09/17  5:41 AM  Result Value Ref Range Status   Adenovirus NOT DETECTED NOT DETECTED Final   Coronavirus 229E NOT DETECTED NOT DETECTED Final   Coronavirus HKU1 NOT DETECTED NOT DETECTED Final   Coronavirus NL63 NOT DETECTED NOT DETECTED Final  Coronavirus OC43 NOT DETECTED NOT DETECTED Final   Metapneumovirus NOT DETECTED NOT DETECTED Final   Rhinovirus / Enterovirus NOT DETECTED NOT DETECTED Final   Influenza A NOT DETECTED NOT DETECTED Final   Influenza B NOT DETECTED NOT DETECTED Final   Parainfluenza Virus 1 NOT DETECTED NOT DETECTED Final   Parainfluenza Virus 2 NOT DETECTED NOT DETECTED Final   Parainfluenza Virus 3 NOT DETECTED NOT DETECTED Final   Parainfluenza Virus 4 NOT DETECTED NOT DETECTED Final   Respiratory Syncytial Virus NOT DETECTED NOT DETECTED Final   Bordetella pertussis NOT DETECTED NOT DETECTED Final   Chlamydophila pneumoniae NOT DETECTED NOT DETECTED Final   Mycoplasma pneumoniae NOT DETECTED NOT DETECTED Final         Radiology Studies: Dg Chest 1 View  Result Date: 06/09/2017 CLINICAL DATA:  Pneumonia. EXAM: CHEST 1 VIEW COMPARISON:  Radiographs of June 07, 2017. FINDINGS: The heart size and mediastinal contours are within normal limits. Left-sided pacemaker is unchanged in position. No pneumothorax is noted. Bilateral perihilar opacities are noted concerning for inflammation or edema. Minimal pleural effusions may be present. The visualized skeletal structures are unremarkable. IMPRESSION: Bilateral perihilar opacities are noted concerning for edema or inflammation. These are not  significantly changed compared to prior exam. Electronically Signed   By: Marijo Conception, M.D.   On: 06/09/2017 15:29        Scheduled Meds: . aspirin EC  81 mg Oral QHS  . [START ON 06/11/2017] azithromycin  500 mg Oral Daily  . [START ON 06/11/2017] cefpodoxime  200 mg Oral Q12H  . diltiazem  360 mg Oral Daily  . enoxaparin (LOVENOX) injection  40 mg Subcutaneous Q24H  . estradiol  0.5 mg Oral QHS  . fluticasone  2 spray Each Nare Daily  . guaiFENesin  1,200 mg Oral BID  . ipratropium-albuterol  3 mL Nebulization TID  . latanoprost  1 drop Both Eyes QHS  . levothyroxine  100 mcg Oral QAC breakfast  . metoprolol tartrate  100 mg Oral BID  . mirabegron ER  25 mg Oral QPM  . montelukast  10 mg Oral QHS  . multivitamin  1 tablet Oral Daily  . pantoprazole  40 mg Oral Daily  . pravastatin  40 mg Oral QPM  . pregabalin  300 mg Oral BID  . torsemide  30 mg Oral Daily   Continuous Infusions: . azithromycin Stopped (06/09/17 1438)  . cefTRIAXone (ROCEPHIN)  IV Stopped (06/09/17 1517)     LOS: 3 days    Time spent: 25 mins.More than 50% of that time was spent in counseling and/or coordination of care.      Marene Lenz, MD [facility] Pager [phone]  If 7PM-7AM, please contact night-coverage [email or web address] Password [facility] 06/10/2017, 12:53 PM

## 2017-06-10 NOTE — Progress Notes (Signed)
Physical Therapy Treatment Patient Details Name: Kelsey Joseph MRN: [id number] DOB: 09-Nov-1927 Today's Date: 06/10/2017    History of Present Illness Kelsey Joseph is a 84 y.o. female with medical history significant of Chronic diastolic heart failure, PAF, hypertension, s/p PPM, hx of disarticulation right hip and admitted bilateral Community acquired pneumonia    PT Comments    Pt assisted OOB to recliner and currently min/guard for mobility for safety.  Focused on monitoring oxygen saturations this session as pt reports SOB prior to admission and with little activity.  Pt may benefit from supplemental oxygen upon d/c home.  SATURATION QUALIFICATIONS: (This note is used to comply with regulatory documentation for home oxygen)  Patient Saturations on Room Air at Rest = 85%  Patient Saturations on Room Air while Ambulating = N/A  Patient Saturations on 1 Liters of oxygen while Transferring (transfers only at baseline) = 83%  Please briefly explain why patient needs home oxygen: to improve oxygen saturations at rest and during physical activity. (SpO2 improved back to 90% after transfer and resting while on 1L O2 [name])   Follow Up Recommendations  No PT follow up     Equipment Recommendations  None recommended by PT    Recommendations for Other Services       Precautions / Restrictions Precautions Precautions: Fall Precaution Comments: monitor sats Restrictions Weight Bearing Restrictions: No    Mobility  Bed Mobility Overal bed mobility: Modified Independent             General bed mobility comments: Increased time, no physical assist  Transfers Overall transfer level: Needs assistance Equipment used: None Transfers: Squat Pivot Transfers     Squat pivot transfers: Min guard     General transfer comment: Min guard for squat pivot from EOB to chair to the right (Armrest lowered).  Monitored SpO2 and pt required supplemental oxygen  Ambulation/Gait                  Stairs            Wheelchair Mobility    Modified Rankin (Stroke Patients Only)       Balance Overall balance assessment: Needs assistance   Sitting balance-Leahy Scale: Good                                      Cognition Arousal/Alertness: Awake/alert Behavior During Therapy: WFL for tasks assessed/performed Overall Cognitive Status: Within Functional Limits for tasks assessed                                        Exercises      General Comments        Pertinent Vitals/Pain Pain Assessment: No/denies pain    Home Living                      Prior Function            PT Goals (current goals can now be found in the care plan section) Progress towards PT goals: Progressing toward goals    Frequency    Min 3X/week      PT Plan Current plan remains appropriate    Co-evaluation              AM-PAC PT "6 Clicks" Daily Activity  Outcome Measure  Difficulty turning over in bed (including adjusting bedclothes, sheets and blankets)?: A Little Difficulty moving from lying on back to sitting on the side of the bed? : A Little Difficulty sitting down on and standing up from a chair with arms (e.g., wheelchair, bedside commode, etc,.)?: Unable Help needed moving to and from a bed to chair (including a wheelchair)?: A Little Help needed walking in hospital room?: Total Help needed climbing 3-5 steps with a railing? : Total 6 Click Score: 12    End of Session Equipment Utilized During Treatment: Oxygen Activity Tolerance: Patient tolerated treatment well Patient left: with call bell/phone within reach;in chair Nurse Communication: Mobility status PT Visit Diagnosis: Muscle weakness (generalized) (M62.81)     Time: 1638-4536 PT Time Calculation (min) (ACUTE ONLY): 18 min  Charges:  $Therapeutic Activity: 8-22 mins                    G Codes:       Carmelia Bake, PT,  DPT 06/10/2017 Pager: [phone]  [place] Ram E 06/10/2017, 3:08 PM

## 2017-06-11 ENCOUNTER — Telehealth: Payer: Self-pay | Admitting: Pulmonary Disease

## 2017-06-11 DIAGNOSIS — J189 Pneumonia, unspecified organism: Secondary | ICD-10-CM

## 2017-06-11 LAB — CULTURE, RESPIRATORY: CULTURE: NORMAL

## 2017-06-11 LAB — CULTURE, RESPIRATORY W GRAM STAIN

## 2017-06-11 MED ORDER — PIPERACILLIN-TAZOBACTAM 3.375 G IVPB
3.3750 g | Freq: Three times a day (TID) | INTRAVENOUS | Status: DC
  Administered 2017-06-11 – 2017-06-13 (×7): 3.375 g via INTRAVENOUS
  Filled 2017-06-11 (×7): qty 50

## 2017-06-11 MED ORDER — FUROSEMIDE 10 MG/ML IJ SOLN
40.0000 mg | Freq: Once | INTRAMUSCULAR | Status: AC
  Administered 2017-06-11: 40 mg via INTRAVENOUS
  Filled 2017-06-11: qty 4

## 2017-06-11 NOTE — Telephone Encounter (Signed)
The patient needs a repeat CT in 6 weeks before seeing Dr. Vaughan Browner.  Her appointment is on 4/22 at 11:15.  Will you please set her up with a non-contrast CT of the chest 1-2 days before the appointment.  Thank you!  Noe Gens, NP-C [facility] Pgr: [phone] or if no answer [phone] 06/11/2017, 4:03 PM

## 2017-06-11 NOTE — Addendum Note (Signed)
Addended by: Lorretta Harp on: 06/11/2017 04:22 PM   Modules accepted: Orders

## 2017-06-11 NOTE — Telephone Encounter (Signed)
Per Noe Gens, NP pt needs to have a CT without contrast before her visit with Dr. Vaughan Browner 07/26/17.  Order placed for CT scan to be done. Nothing further needed at this current time.

## 2017-06-11 NOTE — Progress Notes (Signed)
PROGRESS NOTE    Media E Hires  MGQ:[id number] DOB: 1927-07-07 DOA: 06/07/2017 PCP: [name], MD   Brief Narrative: Kelsey E Simpsonis a 84 y.o.femalewith medical history significant of Chronic diastolic heart failure, PAF, hypertension, s/p PPM, presents with sob, associated with productive cough , and generalised  weakness . On arrival to ED, she was hypoxic and she required 3 lit of [place] oxygen to keep sats greater than 95%. CXR and CT chest shows bilateral pneumonia.  Labs revealed elevated lactic acid and leukocytosis of 17,500. UA was cloudy and showed  many bacteria.Admitted for Bilateral PNA and currently being treated for that.  Currently respiratory status has improved.   Assessment & Plan:   Principal Problem:   Community acquired pneumonia Active Problems:   OBSTRUCTIVE SLEEP APNEA   GERD   Diabetes ([place])   Lower urinary tract infectious disease   Hypothyroid   Phantom limb pain (HCC)   PAF (paroxysmal atrial fibrillation) (HCC)   Hypertension   Pacemaker   Chronic diastolic heart failure (HCC)   Bilateral pneumonia   SOB (shortness of breath)   SSS (sick sinus syndrome) (HCC)  Bilateral community-acquired pneumonia: On azithromycin and ceftriaxone since 06/08/17.Changed to Zosyn tpday. Patient still bringing up blood-tinged sputum. Became more short of breath today.  Bilateral crackles heard on auscultation. Pulmonology requested for consult. CT scan showed bilateral areas of groundglass and more confluent airspace opacities consistent with multifocal pneumonia.  Continue bronchodilators.  Blood cultures, sputum cultures no growth till date Streptococcus pneumonia antigen in urine is negative.  Influenza panel negative respiratory virus panel negative. Repeat chest x-ray on 06/09/17 showed bilateral perihilar opacities ,concerning for edema or inflammation.  OIZ:TIWPY culture showed Klebsiella pneumoniae.  On Abx  SIRS with acute respiratory failure with hypoxia:  Secondary to above.  Improving.  Continue gentle IV fluids  Elevated lactic acid: Improved with IV fluids  Hypertension: Continue metoprolol  History of sick sinus syndrome: Status post pacemaker, on metoprolol and Cardizem.Not on anticoagulation.  Currently in sinus rhythm.  Hypothyroidism: Continue levothyroxine  Hyperlipidemia :Continue statin  Coronary artery disease: Currently denies any chest pain.  Continue aspirin, metoprolol, statin  Right above-knee amputation: Continue to provide supportive care  Chronic diastolic CHF: Not in exacerbation.  Will monitor her volume status closely.Will resume torsemide. IV fluids has been stopped.Given a dose of lasix today as she has bilateral crackles    DVT prophylaxis:Lovenox Code Status: Full Family Communication: None present at the bedside Disposition Plan: Home after resolution of pneumonia and UTI.Awaiting pulmonary evaluation   Consultants: None  Procedures: None  Antimicrobials: Zosyn since 06/11/17 Ceftriaxone and azithromycin :06/08/17-06/10/17                  Subjective: Patient seen and examined the bedside this morning.  She appears more short of breath today.  Still having blood-tinged sputum.  Crackles auscultated bilaterally. Will be given Lasix 40 mg IV  Objective: Vitals:   06/11/17 0523 06/11/17 0955 06/11/17 1017 06/11/17 1301  BP: (!) 128/49 (!) 125/50  (!) 123/53  Pulse: 66 64  89  Resp: 20   18  Temp: 99.7 F (37.6 C)   98.8 F (37.1 C)  TempSrc: Oral   Oral  SpO2: 90%  92% 92%  Weight:      Height:        Intake/Output Summary (Last 24 hours) at 06/11/2017 1305 Last data filed at 06/11/2017 0800 Gross per 24 hour  Intake 600 ml  Output 1600 ml  Net -1000 ml   Filed Weights   06/07/17 2149  Weight: 63.4 kg (139 lb 12.4 oz)    Examination:  General exam: Appears calm and comfortable ,Not in distress,average built,elderly female HEENT:PERRL,Oral mucosa moist, Ear/Nose normal on gross  exam Respiratory system: Bilateral coarsecrackles  Cardiovascular system: S1 & S2 heard, RRR. No JVD, murmurs, rubs, gallops or clicks. Gastrointestinal system: Abdomen is nondistended, soft and nontender. No organomegaly or masses felt. Normal bowel sounds heard. Central nervous system: Alert and oriented. No focal neurological deficits. Extremities: No edema, no clubbing ,no cyanosis, distal peripheral pulses palpable,right AKA Skin: No rashes, lesions or ulcers,no icterus ,no pallor MSK: Normal muscle bulk,tone ,power Psychiatry: Judgement and insight appear normal. Mood & affect appropriate.       Data Reviewed: I have personally reviewed following labs and imaging studies  CBC: Recent Labs  Lab 06/07/17 1443 06/08/17 0142 06/09/17 0903 06/10/17 0442  WBC 17.5* 16.3* 12.8* 9.5  NEUTROABS 15.6*  --  9.9* 6.3  HGB 13.1 10.5* 9.9* 10.0*  HCT 38.5 31.7* 30.0* 30.7*  MCV 93.2 94.6 95.8 94.8  PLT 176 143* 141* 161*   Basic Metabolic Panel: Recent Labs  Lab 06/07/17 1443 06/08/17 0142 06/10/17 0442  NA 138 140 138  K 3.8 4.0 4.4  CL 102 108 110  CO2 24 25 22   GLUCOSE 199* 166* 156*  BUN 22* 22* 22*  CREATININE 0.94 0.90 0.77  CALCIUM 8.5* 7.7* 8.3*   GFR: Estimated Creatinine Clearance: 43.6 mL/min (by C-G formula based on SCr of 0.77 mg/dL). Liver Function Tests: No results for input(s): AST, ALT, ALKPHOS, BILITOT, PROT, ALBUMIN in the last 168 hours. No results for input(s): LIPASE, AMYLASE in the last 168 hours. No results for input(s): AMMONIA in the last 168 hours. Coagulation Profile: No results for input(s): INR, PROTIME in the last 168 hours. Cardiac Enzymes: No results for input(s): CKTOTAL, CKMB, CKMBINDEX, TROPONINI in the last 168 hours. BNP (last 3 results) No results for input(s): PROBNP in the last 8760 hours. HbA1C: No results for input(s): HGBA1C in the last 72 hours. CBG: Recent Labs  Lab 06/08/17 2352  GLUCAP 142*   Lipid Profile: No  results for input(s): CHOL, HDL, LDLCALC, TRIG, CHOLHDL, LDLDIRECT in the last 72 hours. Thyroid Function Tests: No results for input(s): TSH, T4TOTAL, FREET4, T3FREE, THYROIDAB in the last 72 hours. Anemia Panel: No results for input(s): VITAMINB12, FOLATE, FERRITIN, TIBC, IRON, RETICCTPCT in the last 72 hours. Sepsis Labs: Recent Labs  Lab 06/07/17 1452 06/07/17 1955 06/07/17 2210 06/08/17 0142  LATICACIDVEN 2.37* 2.28* 2.5* 1.5    Recent Results (from the past 240 hour(s))  Culture, sputum-assessment     Status: None   Collection Time: 06/07/17 10:15 AM  Result Value Ref Range Status   Specimen Description EXPECTORATED SPUTUM  Final   Special Requests NONE  Final   Sputum evaluation   Final    THIS SPECIMEN IS ACCEPTABLE FOR SPUTUM CULTURE Performed at [facility], [place] [address], [place], [place] [address]    Report Status 06/08/2017 FINAL  Final  Culture, respiratory (NON-Expectorated)     Status: None   Collection Time: 06/07/17 10:15 AM  Result Value Ref Range Status   Specimen Description   Final    EXPECTORATED SPUTUM Performed at [facility], [place] [address] [place],  [address]    Special Requests   Final    NONE Reflexed from [phone] Performed at [facility], [place]  [address], [place], [place] [address]    Gram Stain   Final    MODERATE WBC PRESENT, PREDOMINANTLY PMN FEW GRAM POSITIVE COCCI FEW GRAM NEGATIVE COCCOBACILLI    Culture   Final    Consistent with normal respiratory flora. Performed at [facility], [place] [address] [place], [place] [address]    Report Status 06/11/2017 FINAL  Final  Blood Culture (routine x 2)     Status: None (Preliminary result)   Collection Time: 06/07/17  3:12 PM  Result Value Ref Range Status   Specimen Description   Final    BLOOD LEFT ARM Performed at [place] [address], [place], [place] [address]    Special Requests    Final    IN PEDIATRIC BOTTLE Blood Culture results may not be optimal due to an excessive volume of blood received in culture bottles Performed at [place] [address] [place], [place] [address]    Culture   Final    NO GROWTH 3 DAYS Performed at [facility], [place] [address] [place], [place] [address]    Report Status PENDING  Incomplete  Blood Culture (routine x 2)     Status: None (Preliminary result)   Collection Time: 06/07/17  3:17 PM  Result Value Ref Range Status   Specimen Description   Final    BLOOD RIGHT Performed at [place] [address] [place], [place] [address]    Special Requests   Final    IN PEDIATRIC BOTTLE Blood Culture results may not be optimal due to an excessive volume of blood received in culture bottles Performed at [place] [address], [place], [place] [address]    Culture   Final    NO GROWTH 3 DAYS Performed at [facility], [place] [address], [place], [place] [address]    Report Status PENDING  Incomplete  Urine culture     Status: Abnormal   Collection Time: 06/07/17  7:30 PM  Result Value Ref Range Status   Specimen Description   Final    URINE, RANDOM Performed at [place] [address] [place], [place] [address]    Special Requests   Final    NONE Performed at [facility], [place] [address], [place], [place] [address]    Culture (A)  Final    >=100,000 COLONIES/mL KLEBSIELLA PNEUMONIAE >=100,000 COLONIES/mL AEROCOCCUS URINAE Standardized susceptibility testing for this organism is not available. Performed at [facility], [place] [address], [place], [place] [address]    Report Status 06/10/2017 FINAL  Final   Organism ID, Bacteria KLEBSIELLA PNEUMONIAE (A)  Final      Susceptibility   Klebsiella pneumoniae - MIC*    AMPICILLIN >=32 RESISTANT Resistant     CEFAZOLIN <=4 SENSITIVE Sensitive      CEFTRIAXONE <=1 SENSITIVE Sensitive     CIPROFLOXACIN <=0.25 SENSITIVE Sensitive     GENTAMICIN <=1 SENSITIVE Sensitive     IMIPENEM <=0.25 SENSITIVE Sensitive     NITROFURANTOIN 32 SENSITIVE Sensitive     TRIMETH/SULFA <=20 SENSITIVE Sensitive     AMPICILLIN/SULBACTAM 8 SENSITIVE Sensitive     PIP/TAZO <=4 SENSITIVE Sensitive     Extended ESBL NEGATIVE Sensitive     * >=100,000 COLONIES/mL KLEBSIELLA PNEUMONIAE  Respiratory Panel by PCR     Status: None   Collection Time: 06/09/17  5:41 AM  Result Value Ref Range Status  Adenovirus NOT DETECTED NOT DETECTED Final   Coronavirus 229E NOT DETECTED NOT DETECTED Final   Coronavirus HKU1 NOT DETECTED NOT DETECTED Final   Coronavirus NL63 NOT DETECTED NOT DETECTED Final   Coronavirus OC43 NOT DETECTED NOT DETECTED Final   Metapneumovirus NOT DETECTED NOT DETECTED Final   Rhinovirus / Enterovirus NOT DETECTED NOT DETECTED Final   Influenza A NOT DETECTED NOT DETECTED Final   Influenza B NOT DETECTED NOT DETECTED Final   Parainfluenza Virus 1 NOT DETECTED NOT DETECTED Final   Parainfluenza Virus 2 NOT DETECTED NOT DETECTED Final   Parainfluenza Virus 3 NOT DETECTED NOT DETECTED Final   Parainfluenza Virus 4 NOT DETECTED NOT DETECTED Final   Respiratory Syncytial Virus NOT DETECTED NOT DETECTED Final   Bordetella pertussis NOT DETECTED NOT DETECTED Final   Chlamydophila pneumoniae NOT DETECTED NOT DETECTED Final   Mycoplasma pneumoniae NOT DETECTED NOT DETECTED Final         Radiology Studies: Dg Chest 1 View  Result Date: 06/09/2017 CLINICAL DATA:  Pneumonia. EXAM: CHEST 1 VIEW COMPARISON:  Radiographs of June 07, 2017. FINDINGS: The heart size and mediastinal contours are within normal limits. Left-sided pacemaker is unchanged in position. No pneumothorax is noted. Bilateral perihilar opacities are noted concerning for inflammation or edema. Minimal pleural effusions may be present. The visualized skeletal structures are unremarkable.  IMPRESSION: Bilateral perihilar opacities are noted concerning for edema or inflammation. These are not significantly changed compared to prior exam. Electronically Signed   By: Marijo Conception, M.D.   On: 06/09/2017 15:29        Scheduled Meds: . aspirin EC  81 mg Oral QHS  . diltiazem  360 mg Oral Daily  . enoxaparin (LOVENOX) injection  40 mg Subcutaneous Q24H  . estradiol  0.5 mg Oral QHS  . fluticasone  2 spray Each Nare Daily  . guaiFENesin  1,200 mg Oral BID  . ipratropium-albuterol  3 mL Nebulization TID  . latanoprost  1 drop Both Eyes QHS  . levothyroxine  100 mcg Oral QAC breakfast  . metoprolol tartrate  100 mg Oral BID  . mirabegron ER  25 mg Oral QPM  . montelukast  10 mg Oral QHS  . multivitamin  1 tablet Oral Daily  . pantoprazole  40 mg Oral Daily  . pravastatin  40 mg Oral QPM  . pregabalin  300 mg Oral BID  . torsemide  30 mg Oral Daily   Continuous Infusions: . piperacillin-tazobactam (ZOSYN)  IV Stopped (06/11/17 1219)     LOS: 4 days    Time spent: 25 mins.More than 50% of that time was spent in counseling and/or coordination of care.      Marene Lenz, MD [facility] Pager [phone]  If 7PM-7AM, please contact night-coverage [email or web address] Password [email or web address] 06/11/2017, 1:05 PM
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
## 2017-06-11 NOTE — Progress Notes (Signed)
IV team placed a new IV in the Right Forearm. RN attached tubing to IV Site and site noted to have edema. IV site discontinued. Site shown to Information systems manager.

## 2017-06-12 LAB — CULTURE, BLOOD (ROUTINE X 2)
CULTURE: NO GROWTH
Culture: NO GROWTH

## 2017-06-12 MED ORDER — FUROSEMIDE 10 MG/ML IJ SOLN
20.0000 mg | Freq: Once | INTRAMUSCULAR | Status: AC
  Administered 2017-06-12: 20 mg via INTRAVENOUS
  Filled 2017-06-12: qty 2

## 2017-06-12 NOTE — Plan of Care (Signed)
VSS, patient continues with wheeze and cough this shift but overall feels she is improving.  Able to maintain oxygen saturation around 90% on room air.  No prns administered this shift.  Daughter at bedside.

## 2017-06-12 NOTE — Progress Notes (Signed)
PROGRESS NOTE    Kelsey Joseph  EXB:[id number] DOB: 25-Jun-1927 DOA: 06/07/2017 PCP: [name], MD   Brief Narrative: Kelsey E Simpsonis a 84 y.o.femalewith medical history significant of Chronic diastolic heart failure, PAF, hypertension, s/p PPM, presents with sob, associated with productive cough , and generalised  weakness . On arrival to ED, she was hypoxic and she required 3 lit of [place] oxygen to keep sats greater than 95%. CXR and CT chest shows bilateral pneumonia.  Labs revealed elevated lactic acid and leukocytosis of 17,500. UA was cloudy and showed  many bacteria.Admitted for Bilateral PNA and currently being treated for that.    Assessment & Plan:   Principal Problem:   Community acquired pneumonia Active Problems:   OBSTRUCTIVE SLEEP APNEA   GERD   Diabetes ([place])   Lower urinary tract infectious disease   Hypothyroid   Phantom limb pain (HCC)   PAF (paroxysmal atrial fibrillation) (HCC)   Hypertension   Pacemaker   Chronic diastolic heart failure (HCC)   Bilateral pneumonia   SOB (shortness of breath)   SSS (sick sinus syndrome) (HCC)  Bilateral community-acquired pneumonia: On azithromycin and ceftriaxone since 06/08/17.Changed to Zosyn. Patient still bringing up blood-tinged sputum but its clearing up. Bilateral crackles heard on auscultation. Pulmonology consulted. CT scan showed bilateral areas of groundglass and more confluent airspace opacities consistent with multifocal pneumonia.  Continue bronchodilators.  Blood cultures, sputum cultures no growth till date Streptococcus pneumonia antigen in urine is negative.  Influenza panel negative respiratory virus panel negative. Repeat chest x-ray on 06/09/17 showed bilateral perihilar opacities ,concerning for edema or inflammation.   She will follow-up with pulmonology after discharge.  She has an appointment on 4/22 at 11:15 AM.  She will have a noncontrast CT of the chest on 4/19.   NUU:VOZDG culture showed  Klebsiella pneumoniae.  On Abx  SIRS with acute respiratory failure with hypoxia: Secondary to above.  Improving.    Elevated lactic acid: Improved with IV fluids.IV fluids stopped  Hypertension: Continue metoprolol  History of sick sinus syndrome: Status post pacemaker, on metoprolol and Cardizem.Not on anticoagulation.  Currently in sinus rhythm.  Hypothyroidism: Continue levothyroxine  Hyperlipidemia :Continue statin  Coronary artery disease: Currently denies any chest pain.  Continue aspirin, metoprolol, statin  Right above-knee amputation: Continue to provide supportive care  Chronic diastolic CHF:  Will monitor her volume status closely.Resumed torsemide. IV fluids has been stopped.Given a dose of lasix again  today as she has bilateral crackles    DVT prophylaxis:Lovenox Code Status: Full Family Communication: None present at the bedside Disposition Plan: Home after resolution of pneumonia and UTI.still needs supplemental oxygen for saturation maintenance  Consultants: None  Procedures: None  Antimicrobials: Zosyn since 06/11/17 Ceftriaxone and azithromycin :06/08/17-06/10/17                  Subjective: Patient seen and examined the bedside this morning.  Feels better than yesterday. Cough has improved.  Still requires supplemental oxygen for maintenance of saturation.  We will try to wean off  Objective: Vitals:   06/12/17 0537 06/12/17 0913 06/12/17 0925 06/12/17 1335  BP: (!) 122/49 121/67    Pulse: 71 82    Resp: 18     Temp: 98.7 F (37.1 C)     TempSrc: Oral     SpO2: 93% 94% 91% 92%  Weight:      Height:        Intake/Output Summary (Last 24 hours) at 06/12/2017 1339 Last data  filed at 06/12/2017 6967 Gross per 24 hour  Intake -  Output 700 ml  Net -700 ml   Filed Weights   06/07/17 2149  Weight: 63.4 kg (139 lb 12.4 oz)    Examination:  General exam: Appears calm and comfortable ,Not in distress,average built,elderly female HEENT:PERRL,Oral  mucosa moist, Ear/Nose normal on gross exam Respiratory system: Bilateral coarse crackles ,rhonci Cardiovascular system: S1 & S2 heard, RRR. No JVD, murmurs, rubs, gallops or clicks. Gastrointestinal system: Abdomen is nondistended, soft and nontender. No organomegaly or masses felt. Normal bowel sounds heard. Central nervous system: Alert and oriented. No focal neurological deficits. Extremities: No edema, no clubbing ,no cyanosis, distal peripheral pulses palpable,right AKA Skin: No rashes, lesions or ulcers,no icterus ,no pallor MSK: Normal muscle bulk,tone ,power Psychiatry: Judgement and insight appear normal. Mood & affect appropriate.       Data Reviewed: I have personally reviewed following labs and imaging studies  CBC: Recent Labs  Lab 06/07/17 1443 06/08/17 0142 06/09/17 0903 06/10/17 0442  WBC 17.5* 16.3* 12.8* 9.5  NEUTROABS 15.6*  --  9.9* 6.3  HGB 13.1 10.5* 9.9* 10.0*  HCT 38.5 31.7* 30.0* 30.7*  MCV 93.2 94.6 95.8 94.8  PLT 176 143* 141* 893*   Basic Metabolic Panel: Recent Labs  Lab 06/07/17 1443 06/08/17 0142 06/10/17 0442  NA 138 140 138  K 3.8 4.0 4.4  CL 102 108 110  CO2 24 25 22   GLUCOSE 199* 166* 156*  BUN 22* 22* 22*  CREATININE 0.94 0.90 0.77  CALCIUM 8.5* 7.7* 8.3*   GFR: Estimated Creatinine Clearance: 43.6 mL/min (by C-G formula based on SCr of 0.77 mg/dL). Liver Function Tests: No results for input(s): AST, ALT, ALKPHOS, BILITOT, PROT, ALBUMIN in the last 168 hours. No results for input(s): LIPASE, AMYLASE in the last 168 hours. No results for input(s): AMMONIA in the last 168 hours. Coagulation Profile: No results for input(s): INR, PROTIME in the last 168 hours. Cardiac Enzymes: No results for input(s): CKTOTAL, CKMB, CKMBINDEX, TROPONINI in the last 168 hours. BNP (last 3 results) No results for input(s): PROBNP in the last 8760 hours. HbA1C: No results for input(s): HGBA1C in the last 72 hours. CBG: Recent Labs  Lab  06/08/17 2352  GLUCAP 142*   Lipid Profile: No results for input(s): CHOL, HDL, LDLCALC, TRIG, CHOLHDL, LDLDIRECT in the last 72 hours. Thyroid Function Tests: No results for input(s): TSH, T4TOTAL, FREET4, T3FREE, THYROIDAB in the last 72 hours. Anemia Panel: No results for input(s): VITAMINB12, FOLATE, FERRITIN, TIBC, IRON, RETICCTPCT in the last 72 hours. Sepsis Labs: Recent Labs  Lab 06/07/17 1452 06/07/17 1955 06/07/17 2210 06/08/17 0142  LATICACIDVEN 2.37* 2.28* 2.5* 1.5    Recent Results (from the past 240 hour(s))  Culture, sputum-assessment     Status: None   Collection Time: 06/07/17 10:15 AM  Result Value Ref Range Status   Specimen Description EXPECTORATED SPUTUM  Final   Special Requests NONE  Final   Sputum evaluation   Final    THIS SPECIMEN IS ACCEPTABLE FOR SPUTUM CULTURE Performed at [facility], [place] [address], [place], [place] [address]    Report Status 06/08/2017 FINAL  Final  Culture, respiratory (NON-Expectorated)     Status: None   Collection Time: 06/07/17 10:15 AM  Result Value Ref Range Status   Specimen Description   Final    EXPECTORATED SPUTUM Performed at [facility], [place] [address], [place], [place] [address]    Special Requests  Final    NONE Reflexed from [id number] Performed at [facility], [place] [address], [place], [place] [address]    Gram Stain   Final    MODERATE WBC PRESENT, PREDOMINANTLY PMN FEW GRAM POSITIVE COCCI FEW GRAM NEGATIVE COCCOBACILLI    Culture   Final    Consistent with normal respiratory flora. Performed at [facility], [place] [address] [place], [place] [address]    Report Status 06/11/2017 FINAL  Final  Blood Culture (routine x 2)     Status: None   Collection Time: 06/07/17  3:12 PM  Result Value Ref Range Status   Specimen Description   Final    BLOOD LEFT ARM Performed at [place] [address] [place], [place]  [address]    Special Requests   Final    IN PEDIATRIC BOTTLE Blood Culture results may not be optimal due to an excessive volume of blood received in culture bottles Performed at [place] [address] [place], [place] [address]    Culture   Final    NO GROWTH 5 DAYS Performed at [facility], [place] [address], [place], [place] [address]    Report Status 06/12/2017 FINAL  Final  Blood Culture (routine x 2)     Status: None   Collection Time: 06/07/17  3:17 PM  Result Value Ref Range Status   Specimen Description   Final    BLOOD RIGHT Performed at [place] [address], [place], [place] [address]    Special Requests   Final    IN PEDIATRIC BOTTLE Blood Culture results may not be optimal due to an excessive volume of blood received in culture bottles Performed at [place] [address] [place], [place] [address]    Culture   Final    NO GROWTH 5 DAYS Performed at [facility], [place] [address] [place], [place] [address]    Report Status 06/12/2017 FINAL  Final  Urine culture     Status: Abnormal   Collection Time: 06/07/17  7:30 PM  Result Value Ref Range Status   Specimen Description   Final    URINE, RANDOM Performed at [facility] [address] [place], [place] [address]    Special Requests   Final    NONE Performed at [facility], [place] [address], [place], [place] [address]    Culture (A)  Final    >=100,000 COLONIES/mL KLEBSIELLA PNEUMONIAE >=100,000 COLONIES/mL AEROCOCCUS URINAE Standardized susceptibility testing for this organism is not available. Performed at [facility], [place] [address], [place],  [address]    Report Status 06/10/2017 FINAL  Final   Organism ID, Bacteria KLEBSIELLA PNEUMONIAE (A)  Final      Susceptibility   Klebsiella pneumoniae - MIC*    AMPICILLIN >=32 RESISTANT Resistant     CEFAZOLIN <=4 SENSITIVE  Sensitive     CEFTRIAXONE <=1 SENSITIVE Sensitive     CIPROFLOXACIN <=0.25 SENSITIVE Sensitive     GENTAMICIN <=1 SENSITIVE Sensitive     IMIPENEM <=0.25 SENSITIVE Sensitive     NITROFURANTOIN 32 SENSITIVE Sensitive     TRIMETH/SULFA <=20 SENSITIVE Sensitive     AMPICILLIN/SULBACTAM 8 SENSITIVE Sensitive     PIP/TAZO <=4 SENSITIVE Sensitive     Extended ESBL NEGATIVE Sensitive     * >=100,000 COLONIES/mL KLEBSIELLA PNEUMONIAE  Respiratory Panel by PCR     Status: None  Collection Time: 06/09/17  5:41 AM  Result Value Ref Range Status   Adenovirus NOT DETECTED NOT DETECTED Final   Coronavirus 229E NOT DETECTED NOT DETECTED Final   Coronavirus HKU1 NOT DETECTED NOT DETECTED Final   Coronavirus NL63 NOT DETECTED NOT DETECTED Final   Coronavirus OC43 NOT DETECTED NOT DETECTED Final   Metapneumovirus NOT DETECTED NOT DETECTED Final   Rhinovirus / Enterovirus NOT DETECTED NOT DETECTED Final   Influenza A NOT DETECTED NOT DETECTED Final   Influenza B NOT DETECTED NOT DETECTED Final   Parainfluenza Virus 1 NOT DETECTED NOT DETECTED Final   Parainfluenza Virus 2 NOT DETECTED NOT DETECTED Final   Parainfluenza Virus 3 NOT DETECTED NOT DETECTED Final   Parainfluenza Virus 4 NOT DETECTED NOT DETECTED Final   Respiratory Syncytial Virus NOT DETECTED NOT DETECTED Final   Bordetella pertussis NOT DETECTED NOT DETECTED Final   Chlamydophila pneumoniae NOT DETECTED NOT DETECTED Final   Mycoplasma pneumoniae NOT DETECTED NOT DETECTED Final         Radiology Studies: No results found.      Scheduled Meds: . aspirin EC  81 mg Oral QHS  . diltiazem  360 mg Oral Daily  . enoxaparin (LOVENOX) injection  40 mg Subcutaneous Q24H  . estradiol  0.5 mg Oral QHS  . fluticasone  2 spray Each Nare Daily  . furosemide  20 mg Intravenous Once  . guaiFENesin  1,200 mg Oral BID  . ipratropium-albuterol  3 mL Nebulization TID  . latanoprost  1 drop Both Eyes QHS  . levothyroxine  100 mcg Oral QAC  breakfast  . metoprolol tartrate  100 mg Oral BID  . mirabegron ER  25 mg Oral QPM  . montelukast  10 mg Oral QHS  . multivitamin  1 tablet Oral Daily  . pantoprazole  40 mg Oral Daily  . pravastatin  40 mg Oral QPM  . pregabalin  300 mg Oral BID  . torsemide  30 mg Oral Daily   Continuous Infusions: . piperacillin-tazobactam (ZOSYN)  IV 3.375 g (06/12/17 1321)     LOS: 5 days    Time spent: 25 mins.More than 50% of that time was spent in counseling and/or coordination of care.      Marene Lenz, MD [facility] Pager [phone]  If 7PM-7AM, please contact night-coverage [email or web address] Password [email or web address] 06/12/2017, 1:39 PM

## 2017-06-13 MED ORDER — AMOXICILLIN-POT CLAVULANATE 875-125 MG PO TABS: 1.0000 | ORAL_TABLET | Freq: Two times a day (BID) | ORAL | 0 refills | Status: AC

## 2017-06-13 MED ORDER — HYDROCORTISONE 1 % EX LOTN: 1.0000 "application " | TOPICAL_LOTION | Freq: Two times a day (BID) | CUTANEOUS | 0 refills | Status: DC

## 2017-06-13 MED ORDER — GUAIFENESIN ER 600 MG PO TB12: 1200.0000 mg | ORAL_TABLET | Freq: Two times a day (BID) | ORAL | 0 refills | Status: DC

## 2017-06-13 NOTE — Plan of Care (Signed)
Discharge instructions reviewed with patient and daughter, questions answered, verbalized understanding.  Dtr brought patients motorized scooter to hospital, patient assisted to transfer to scooter and RN accompanied patient and daughter out to handicap accessible [facility] to be taken home by daughter.  Patient with all belongings in her possession at time of discharge.

## 2017-06-13 NOTE — Progress Notes (Signed)
Occupational Therapy Treatment Patient Details Name: Kelsey Joseph MRN: [id number] DOB: July 31, 1927 Today's Date: 06/13/2017    History of present illness Kelsey Joseph is a 84 y.o. female with medical history significant of Chronic diastolic heart failure, PAF, hypertension, s/p PPM, hx of disarticulation right hip and admitted bilateral Community acquired pneumonia   OT comments  Pt making good progress. Sitting up in her chair putting on her make up on arrival. O2 at rest on RA 92. With activity 93 RA. Pt states she feels generally weak and fatigues easily. [place] A for mobility. Pt's daughter is here for 2 weeks from [place], however, feel pt would benefit from HHOT/PT to help return to PLOF and reduce risk of admission as pt was completely independent, including driving/shopping. Left voicemail for CM regarding recommendations.   Follow Up Recommendations  Home health OT;Supervision - Intermittent    Equipment Recommendations  None recommended by OT    Recommendations for Other Services      Precautions / Restrictions Precautions Precautions: Fall Precaution Comments: monitor sats Restrictions Weight Bearing Restrictions: No       Mobility Bed Mobility               General bed mobility comments: OOB in chair  Transfers                      Balance             Standing balance-Leahy Scale: Poor Standing balance comment: typically pulls up on bar                           ADL either performed or assessed with clinical judgement   ADL       Grooming: Set up;Sitting   Upper Body Bathing: Supervision/ safety;Set up;Sitting           Lower Body Dressing: Min guard Lower Body Dressing Details (indicate cue type and reason): donning sock             Functional mobility during ADLs: Min guard(simulated) General ADL Comments: Pt states she feels overall weak.     Vision       Perception     Praxis      Cognition  Arousal/Alertness: Awake/alert Behavior During Therapy: WFL for tasks assessed/performed Overall Cognitive Status: Within Functional Limits for tasks assessed                                          Exercises     Shoulder Instructions       General Comments      Pertinent Vitals/ Pain       Pain Assessment: No/denies pain  Home Living                                          Prior Functioning/Environment              Frequency           Progress Toward Goals  OT Goals(current goals can now be found in the care plan section)  Progress towards OT goals: Progressing toward goals  Acute Rehab OT Goals Patient Stated Goal: to go home OT Goal Formulation: With patient Time For Goal  Achievement: 06/23/17 Potential to Achieve Goals: Good ADL Goals Pt Will Perform Grooming: with modified independence;sitting Pt Will Perform Lower Body Bathing: with modified independence;sitting/lateral leans Pt Will Perform Lower Body Dressing: with modified independence;sitting/lateral leans Pt Will Transfer to Toilet: with modified independence;squat pivot transfer;bedside commode Pt Will Perform Toileting - Clothing Manipulation and hygiene: with modified independence;sitting/lateral leans Pt/caregiver will Perform Home Exercise Program: Increased strength;Both right and left upper extremity;With written HEP provided  Plan Discharge plan needs to be updated    Co-evaluation                 AM-PAC PT "6 Clicks" Daily Activity     Outcome Measure   Help from another person eating meals?: None Help from another person taking care of personal grooming?: None Help from another person toileting, which includes using toliet, bedpan, or urinal?: A Little Help from another person bathing (including washing, rinsing, drying)?: A Little Help from another person to put on and taking off regular upper body clothing?: None Help from another person  to put on and taking off regular lower body clothing?: A Little 6 Click Score: 21    End of Session    OT Visit Diagnosis: Muscle weakness (generalized) (M62.81)   Activity Tolerance Patient tolerated treatment well   Patient Left in chair;with call bell/phone within reach;with chair alarm set;with family/visitor present;with nursing/sitter in room   Nurse Communication Mobility status;Other (comment)        Time: 8657-8469 OT Time Calculation (min): 22 min  Charges: OT General Charges $OT Visit: 1 Visit OT Treatments $Self Care/Home Management : 8-22 mins  [facility], OT/L  [phone] 06/13/2017   Judith Demps,HILLARY 06/13/2017, 12:25 PM

## 2017-06-13 NOTE — Discharge Summary (Signed)
Physician Discharge Summary  Kelsey Joseph PJA:[id number] DOB: [birth year] DOA: 06/07/2017  PCP: [name], MD  Admit date: 06/07/2017 Discharge date: 06/13/2017  Admitted From: Home Disposition:  Home  Discharge Condition: Stable CODE STATUS:Full Diet recommendation: Heart Healthy    Brief/Interim Summary: Kelsey E Simpsonis a 84 y.o.femalewith medical history significant of Chronic diastolic heart failure, PAF, hypertension, s/p PPM, presents with sob, associated with productive cough with blood tinged sputum  and generalised  weakness . On arrival to ED, she was hypoxic and she required 3 lit of [place] oxygen to keep sats greater than 95%. CXR and CT chest shows bilateral pneumonia.  Labs revealed elevated lactic acid and leukocytosis of 17,500. UA was cloudy and showed  many bacteria.Admitted for Bilateral PNA ,UTI and she was currently being treated for that.   Patient's hospital course was remarkable for persistent dyspnea, desaturation .She required supplemental oxygen for most of her hospital stay but right now she is saturating fine on room air.  She was also evaluated by pulmonology while she was inpatient and she has been arranged for and follow-up as an outpatient with them.  Patient's respiratory status is stable now.  She is a stable for discharge to home today.  She will continue on oral antibiotics to finish  the total course of 10 days. She will follow-up with her PCP within a week and also pulmonology on April 22.  Following problems were addressed during her hospitalization:  Bilateral community-acquired pneumonia:Improved with antibiotics. CT scan showed bilateral areas of groundglass and more confluent airspace opacities consistent with multifocal pneumonia.  Blood cultures, sputum cultures no growth till date. Streptococcus pneumonia antigen in urine is negative.  Influenza panel negative respiratory virus panel negative. Repeat chest x-ray on 06/09/17 showed bilateral  perihilar opacities ,concerning for edema or inflammation.   Pulmonology consulted .She will follow-up with pulmonology after discharge.  She has an appointment on 4/22 at 11:15 AM.  She will have a noncontrast CT of the chest few days before the appointment. Currently she is saturating fine on room air.Continue antibiotics at home   HAL:PFXTK culture showed Klebsiella pneumoniae.  On Abx  Elevated lactic acid: Improved with IV fluids.IV fluids stopped  Hypertension: Continue metoprolol  History of sick sinus syndrome: Status post pacemaker, on metoprolol and Cardizem.Not on anticoagulation.  Currently in sinus rhythm.  Hypothyroidism: Continue levothyroxine  Hyperlipidemia :Continue statin  Coronary artery disease: Currently denies any chest pain.  Continue aspirin, metoprolol, statin  Right above-knee amputation: Supportive care  Chronic diastolic CHF:   No peripheral edema.  She has mild crackles bilaterally on the basis .Resumed torsemide.  Was also treated with few doses of Lasix.    Discharge Diagnoses:  Principal Problem:   Community acquired pneumonia Active Problems:   OBSTRUCTIVE SLEEP APNEA   GERD   Diabetes ([place])   Lower urinary tract infectious disease   Hypothyroid   Phantom limb pain (HCC)   PAF (paroxysmal atrial fibrillation) (HCC)   Hypertension   Pacemaker   Chronic diastolic heart failure (HCC)   Bilateral pneumonia   SOB (shortness of breath)   SSS (sick sinus syndrome) (HCC)    Discharge Instructions  Discharge Instructions    Diet - low sodium heart healthy   Complete by:  As directed    Discharge instructions   Complete by:  As directed    1) Please take prescribed medications as instructed. 2) Follow up with your PCP within a week. 3) Follow up with pulmonology on  4/22 at 11.15 am.Do a noncontrast CT of the chest 2-3  before the appointment date. 4)Check CBC and BMP during follow up with your PCP.   Increase activity slowly    Complete by:  As directed      Allergies as of 06/13/2017      Reactions   Latex Other (See Comments)   Patient states only "latex bandages" causes blisters   Sulfa Antibiotics Diarrhea   Severe diarrhea   Adhesive [tape] Other (See Comments)   Blisters, when left on for "a while."      Medication List    TAKE these medications   amoxicillin-clavulanate 875-125 MG tablet Commonly known as:  AUGMENTIN Take 1 tablet by mouth 2 (two) times daily for 3 days. Start taking on:  06/14/2017   aspirin EC 81 MG tablet Take 1 tablet (81 mg total) by mouth daily. What changed:  when to take this   BIOTIN PO Take 1 tablet by mouth daily.   CALCIUM + D PO Take 1 tablet by mouth daily.   conjugated estrogens vaginal cream Commonly known as:  PREMARIN Place 1 Applicatorful vaginally every 30 (thirty) days.   cycloSPORINE 0.05 % ophthalmic emulsion Commonly known as:  RESTASIS Place 1 drop into both eyes as needed (For dry eyes.).   diltiazem 360 MG 24 hr capsule Commonly known as:  TIAZAC TAKE 1 CAPSULE DAILY What changed:    how much to take  how to take this  when to take this   estradiol 0.5 MG tablet Commonly known as:  ESTRACE Take 0.5 mg by mouth at bedtime.   fluticasone 50 MCG/ACT nasal spray Commonly known as:  FLONASE INSERT 1 SPRAY INTO EACH NARE AT BEDTIME AS NEEDED FOR CONGESTION/ALLERGIES   guaiFENesin 600 MG 12 hr tablet Commonly known as:  MUCINEX Take 2 tablets (1,200 mg total) by mouth 2 (two) times daily.   latanoprost 0.005 % ophthalmic solution Commonly known as:  XALATAN Place 1 drop into both eyes at bedtime.   LORazepam 0.5 MG tablet Commonly known as:  ATIVAN Take 1 tablet (0.5 mg total) by mouth as needed for anxiety.   meloxicam 15 MG tablet Commonly known as:  MOBIC Take 15 mg by mouth daily. For shoulder pain   metoprolol tartrate 100 MG tablet Commonly known as:  LOPRESSOR Take 100 mg by mouth 2 (two) times daily.    montelukast 10 MG tablet Commonly known as:  SINGULAIR Take 10 mg by mouth daily.   MYRBETRIQ 25 MG Tb24 tablet Generic drug:  mirabegron ER Take 25 mg by mouth every evening.   nitroGLYCERIN 0.4 MG SL tablet Commonly known as:  NITROSTAT Place 1 tablet (0.4 mg total) under the tongue every 5 (five) minutes x 3 doses as needed for chest pain.   omeprazole 40 MG capsule Commonly known as:  PRILOSEC Take 40 mg by mouth daily.   potassium chloride 10 MEQ tablet Commonly known as:  K-DUR Take 1 tablet (62meq) on days you take extra Torsemide only   pravastatin 40 MG tablet Commonly known as:  PRAVACHOL TAKE 1 TABLET EVERY EVENING   pregabalin 300 MG capsule Commonly known as:  LYRICA Take 300 mg by mouth 2 (two) times daily.   PRESERVISION AREDS Tabs Take 1 tablet by mouth daily.   REFRESH OP Place 1 drop into both eyes daily as needed (For dry eyes or irritation.).   SYNTHROID 100 MCG tablet Generic drug:  levothyroxine Take 100 mcg by mouth daily.  torsemide 20 MG tablet Commonly known as:  DEMADEX Take 30 mg by mouth daily.   traMADol 50 MG tablet Commonly known as:  ULTRAM Take 50 mg by mouth every 6 (six) hours as needed for moderate pain.      Follow-up Information    Marshell Garfinkel, MD Follow up on 07/26/2017.   Specialty:  Pulmonary Disease Why:  Appt at 11:15.  Please arrive at 11:00 am for check in.  Office will call you with a CT appointment time.  Contact information: [address] [place] [address] [phone]        [name], MD. Schedule an appointment as soon as possible for a visit in 1 week(s).   Specialty:  Internal Medicine Contact information: [place] [address] [phone]          Allergies  Allergen Reactions  . Latex Other (See Comments)    Patient states only "latex bandages" causes blisters  . Sulfa Antibiotics Diarrhea    Severe diarrhea  . Adhesive [Tape]  Other (See Comments)    Blisters, when left on for "a while."    Consultations:  Pulmonology   Procedures/Studies: Dg Chest 1 View  Result Date: 06/09/2017 CLINICAL DATA:  Pneumonia. EXAM: CHEST 1 VIEW COMPARISON:  Radiographs of June 07, 2017. FINDINGS: The heart size and mediastinal contours are within normal limits. Left-sided pacemaker is unchanged in position. No pneumothorax is noted. Bilateral perihilar opacities are noted concerning for inflammation or edema. Minimal pleural effusions may be present. The visualized skeletal structures are unremarkable. IMPRESSION: Bilateral perihilar opacities are noted concerning for edema or inflammation. These are not significantly changed compared to prior exam. Electronically Signed   By: Marijo Conception, M.D.   On: 06/09/2017 15:29   Dg Chest 2 View  Result Date: 06/07/2017 CLINICAL DATA:  Productive cough for several days with shortness of Breath EXAM: CHEST  2 VIEW COMPARISON:  10/18/2016 FINDINGS: Cardiac shadow is stable. Pacing device is again seen. There is significant right hilar and perihilar density identified which projects into the superior segment of the right lower lobe. These changes are suspicious for underlying mass lesion although could simply be related to lower lobe infiltrate. Some patchy changes in the left base are noted. CT of the chest with contrast is recommended for further evaluation. IMPRESSION: Changes in the right hilum and perihilar region suspicious for underlying mass. Further evaluation by means of CT of the chest is recommended. Electronically Signed   By: Inez Catalina M.D.   On: 06/07/2017 14:39   Ct Chest W Contrast  Result Date: 06/07/2017 CLINICAL DATA:  Per EMS, pt is coming from home with complaints of sob. Pt reports having sob, productive cough, weakness, and burning with urination. Pt has a right aka and uses a motorized scooter. Pt is AO x4. Pt has a hx of a-fib, pacemaker, hypertension, and diabetes. EXAM:  CT CHEST WITH CONTRAST TECHNIQUE: Multidetector CT imaging of the chest was performed during intravenous contrast administration. CONTRAST:  21mL ISOVUE-300 IOPAMIDOL (ISOVUE-300) INJECTION 61% COMPARISON:  Current chest radiograph. FINDINGS: Cardiovascular: Heart is normal size. Mild to moderate three-vessel coronary artery calcifications. No pericardial effusion. Great vessels are normal in caliber. There is aortic atherosclerosis extending into the origins of the left common carotid and left subclavian arteries without significant stenosis. Mediastinum/Nodes: Prominent right neck base lymph node measuring 9 mm in short axis. There are no neck base or axillary masses or pathologically enlarged lymph  nodes. There are scattered prominent shotty mediastinal nodes, largest a precarinal node measuring 13 mm in short axis. No discrete mediastinal or hilar mass. Trachea is widely patent. Esophagus is unremarkable. Lungs/Pleura: There are bilateral patchy areas of ground-glass and more confluent airspace opacities. The larger more confluent areas of opacity lie in the lower lobes. Patchy ground-glass opacities are noted bilaterally throughout all lobes. These have a peribronchovascular distribution. There are no discrete masses. There is no evidence of pulmonary edema. Trace pleural effusions are noted bilaterally. No pneumothorax. Upper Abdomen: No acute findings. Gallbladder is distended without wall thickening or inflammation. There is a single visible dependent small gallstone. Musculoskeletal: No fracture or acute finding. No osteoblastic or osteolytic lesions. There are significant degenerative changes along the thoracic spine. IMPRESSION: 1. Bilateral areas of ground-glass and more confluent airspace opacities consistent with multifocal pneumonia. No discrete masses to suggest neoplastic disease. Prominent mediastinal lymph nodes are noted which are presumed reactive. 2. Chronic findings include coronary artery  calcifications, aortic atherosclerosis and a single gallstone. Aortic Atherosclerosis (ICD10-I70.0). Electronically Signed   By: Lajean Manes M.D.   On: 06/07/2017 17:06    (Echo, Carotid, EGD, Colonoscopy, ERCP)    Subjective: Patient seen and examined the bedside this morning.  Remains comfortable today.  Respiratory status has improved.  Currently saturating fine without supplemental oxygen.  Stable for discharge to home  Discharge Exam: Vitals:   06/13/17 0906 06/13/17 0959  BP:  (!) 114/59  Pulse:  71  Resp:    Temp:    SpO2: 93% 95%   Vitals:   06/12/17 2209 06/13/17 0640 06/13/17 0906 06/13/17 0959  BP: 122/73 123/63  (!) 114/59  Pulse: 86 72  71  Resp: 18 18    Temp: 98.9 F (37.2 C) 98.2 F (36.8 C)    TempSrc: Oral Oral    SpO2: 91% 93% 93% 95%  Weight:      Height:        General: Pt is alert, awake, not in acute distress Cardiovascular: RRR, S1/S2 +, no rubs, no gallops Respiratory: Bilateral basal crackles  abdominal: Soft, NT, ND, bowel sounds + Extremities: no edema, no cyanosis, right AKA    The results of significant diagnostics from this hospitalization (including imaging, microbiology, ancillary and laboratory) are listed below for reference.     Microbiology: Recent Results (from the past 240 hour(s))  Culture, sputum-assessment     Status: None   Collection Time: 06/07/17 10:15 AM  Result Value Ref Range Status   Specimen Description EXPECTORATED SPUTUM  Final   Special Requests NONE  Final   Sputum evaluation   Final    THIS SPECIMEN IS ACCEPTABLE FOR SPUTUM CULTURE Performed at [facility], [place] [address], [place], [place] [address]    Report Status 06/08/2017 FINAL  Final  Culture, respiratory (NON-Expectorated)     Status: None   Collection Time: 06/07/17 10:15 AM  Result Value Ref Range Status   Specimen Description   Final    EXPECTORATED SPUTUM Performed at [facility], [place] [address], [place], [place] [address]    Special Requests   Final    NONE Reflexed from [id number] Performed at [facility], [place] [address], [place], [place] [address]    Gram Stain   Final    MODERATE WBC PRESENT, PREDOMINANTLY PMN FEW GRAM POSITIVE COCCI FEW GRAM NEGATIVE COCCOBACILLI    Culture   Final    Consistent with normal respiratory flora.  Performed at [facility], [place] [address], [place], [place] [address]    Report Status 06/11/2017 FINAL  Final  Blood Culture (routine x 2)     Status: None   Collection Time: 06/07/17  3:12 PM  Result Value Ref Range Status   Specimen Description   Final    BLOOD LEFT ARM Performed at [place] [address] [place], [place] [address]    Special Requests   Final    IN PEDIATRIC BOTTLE Blood Culture results may not be optimal due to an excessive volume of blood received in culture bottles Performed at [place] [address] [place], [place] [address]    Culture   Final    NO GROWTH 5 DAYS Performed at [facility], [place] [address] [place], [place] [address]    Report Status 06/12/2017 FINAL  Final  Blood Culture (routine x 2)     Status: None   Collection Time: 06/07/17  3:17 PM  Result Value Ref Range Status   Specimen Description   Final    BLOOD RIGHT Performed at [place] [address], [place], [place] [address]    Special Requests   Final    IN PEDIATRIC BOTTLE Blood Culture results may not be optimal due to an excessive volume of blood received in culture bottles Performed at [place] [address] [place], [place] [address]    Culture   Final    NO GROWTH 5 DAYS Performed at [facility], [place] [address] [place], [place] [address]    Report Status 06/12/2017 FINAL  Final  Urine culture     Status: Abnormal   Collection Time: 06/07/17  7:30 PM  Result Value Ref Range Status   Specimen  Description   Final    URINE, RANDOM Performed at [place] [address] [place], [place] [address]    Special Requests   Final    NONE Performed at [facility], [place] [address], [place], [place] [address]    Culture (A)  Final    >=100,000 COLONIES/mL KLEBSIELLA PNEUMONIAE >=100,000 COLONIES/mL AEROCOCCUS URINAE Standardized susceptibility testing for this organism is not available. Performed at [facility], [place] [address] [place], [place] [address]    Report Status 06/10/2017 FINAL  Final   Organism ID, Bacteria KLEBSIELLA PNEUMONIAE (A)  Final      Susceptibility   Klebsiella pneumoniae - MIC*    AMPICILLIN >=32 RESISTANT Resistant     CEFAZOLIN <=4 SENSITIVE Sensitive     CEFTRIAXONE <=1 SENSITIVE Sensitive     CIPROFLOXACIN <=0.25 SENSITIVE Sensitive     GENTAMICIN <=1 SENSITIVE Sensitive     IMIPENEM <=0.25 SENSITIVE Sensitive     NITROFURANTOIN 32 SENSITIVE Sensitive     TRIMETH/SULFA <=20 SENSITIVE Sensitive     AMPICILLIN/SULBACTAM 8 SENSITIVE Sensitive     PIP/TAZO <=4 SENSITIVE Sensitive     Extended ESBL NEGATIVE Sensitive     * >=100,000 COLONIES/mL KLEBSIELLA PNEUMONIAE  Respiratory Panel by PCR     Status: None   Collection Time: 06/09/17  5:41 AM  Result Value Ref Range Status   Adenovirus NOT DETECTED NOT DETECTED Final   Coronavirus 229E NOT DETECTED NOT DETECTED Final   Coronavirus HKU1 NOT DETECTED NOT DETECTED Final   Coronavirus NL63 NOT DETECTED NOT DETECTED Final   Coronavirus OC43 NOT DETECTED NOT DETECTED Final   Metapneumovirus  NOT DETECTED NOT DETECTED Final   Rhinovirus / Enterovirus NOT DETECTED NOT DETECTED Final   Influenza A NOT DETECTED NOT DETECTED Final   Influenza B NOT DETECTED NOT DETECTED Final   Parainfluenza Virus 1 NOT DETECTED NOT DETECTED Final   Parainfluenza Virus 2 NOT DETECTED NOT DETECTED Final   Parainfluenza Virus 3 NOT DETECTED NOT DETECTED Final   Parainfluenza Virus  4 NOT DETECTED NOT DETECTED Final   Respiratory Syncytial Virus NOT DETECTED NOT DETECTED Final   Bordetella pertussis NOT DETECTED NOT DETECTED Final   Chlamydophila pneumoniae NOT DETECTED NOT DETECTED Final   Mycoplasma pneumoniae NOT DETECTED NOT DETECTED Final     Labs: BNP (last 3 results) Recent Labs    10/17/16 0305  BNP 098.1*   Basic Metabolic Panel: Recent Labs  Lab 06/07/17 1443 06/08/17 0142 06/10/17 0442  NA 138 140 138  K 3.8 4.0 4.4  CL 102 108 110  CO2 24 25 22   GLUCOSE 199* 166* 156*  BUN 22* 22* 22*  CREATININE 0.94 0.90 0.77  CALCIUM 8.5* 7.7* 8.3*   Liver Function Tests: No results for input(s): AST, ALT, ALKPHOS, BILITOT, PROT, ALBUMIN in the last 168 hours. No results for input(s): LIPASE, AMYLASE in the last 168 hours. No results for input(s): AMMONIA in the last 168 hours. CBC: Recent Labs  Lab 06/07/17 1443 06/08/17 0142 06/09/17 0903 06/10/17 0442  WBC 17.5* 16.3* 12.8* 9.5  NEUTROABS 15.6*  --  9.9* 6.3  HGB 13.1 10.5* 9.9* 10.0*  HCT 38.5 31.7* 30.0* 30.7*  MCV 93.2 94.6 95.8 94.8  PLT 176 143* 141* 145*   Cardiac Enzymes: No results for input(s): CKTOTAL, CKMB, CKMBINDEX, TROPONINI in the last 168 hours. BNP: Invalid input(s): POCBNP CBG: Recent Labs  Lab 06/08/17 2352  GLUCAP 142*   D-Dimer No results for input(s): DDIMER in the last 72 hours. Hgb A1c No results for input(s): HGBA1C in the last 72 hours. Lipid Profile No results for input(s): CHOL, HDL, LDLCALC, TRIG, CHOLHDL, LDLDIRECT in the last 72 hours. Thyroid function studies No results for input(s): TSH, T4TOTAL, T3FREE, THYROIDAB in the last 72 hours.  Invalid input(s): FREET3 Anemia work up No results for input(s): VITAMINB12, FOLATE, FERRITIN, TIBC, IRON, RETICCTPCT in the last 72 hours. Urinalysis    Component Value Date/Time   COLORURINE YELLOW (A) 06/07/2017 1408   APPEARANCEUR CLOUDY (A) 06/07/2017 1408   LABSPEC 1.016 06/07/2017 1408   PHURINE  8.0 06/07/2017 1408   GLUCOSEU NEGATIVE 06/07/2017 1408   HGBUR SMALL (A) 06/07/2017 1408   HGBUR negative 12/21/2008 1209   BILIRUBINUR NEGATIVE 06/07/2017 1408   BILIRUBINUR n 12/11/2010 1100   KETONESUR 5 (A) 06/07/2017 1408   PROTEINUR 30 (A) 06/07/2017 1408   UROBILINOGEN 0.2 07/02/2013 0030   NITRITE NEGATIVE 06/07/2017 1408   LEUKOCYTESUR NEGATIVE 06/07/2017 1408   Sepsis Labs Invalid input(s): PROCALCITONIN,  WBC,  LACTICIDVEN Microbiology Recent Results (from the past 240 hour(s))  Culture, sputum-assessment     Status: None   Collection Time: 06/07/17 10:15 AM  Result Value Ref Range Status   Specimen Description EXPECTORATED SPUTUM  Final   Special Requests NONE  Final   Sputum evaluation   Final    THIS SPECIMEN IS ACCEPTABLE FOR SPUTUM CULTURE Performed at [facility], [place] [address], [place], [place] [address]    Report Status 06/08/2017 FINAL  Final  Culture, respiratory (NON-Expectorated)     Status: None   Collection Time: 06/07/17 10:15 AM  Result Value  Ref Range Status   Specimen Description   Final    EXPECTORATED SPUTUM Performed at [place] [address], [place], [place] [address]    Special Requests   Final    NONE Reflexed from [id number] Performed at [facility], [place] [address], [place], [place] [address]    Gram Stain   Final    MODERATE WBC PRESENT, PREDOMINANTLY PMN FEW GRAM POSITIVE COCCI FEW GRAM NEGATIVE COCCOBACILLI    Culture   Final    Consistent with normal respiratory flora. Performed at [facility], [place] [address], [place], [place] [address]    Report Status 06/11/2017 FINAL  Final  Blood Culture (routine x 2)     Status: None   Collection Time: 06/07/17  3:12 PM  Result Value Ref Range Status   Specimen Description   Final    BLOOD LEFT ARM Performed at [place] [address], [place], [place] [address]    Special Requests   Final     IN PEDIATRIC BOTTLE Blood Culture results may not be optimal due to an excessive volume of blood received in culture bottles Performed at [place] [address], [place], [place] [address]    Culture   Final    NO GROWTH 5 DAYS Performed at [facility], [place] [address], [place], [place] [address]    Report Status 06/12/2017 FINAL  Final  Blood Culture (routine x 2)     Status: None   Collection Time: 06/07/17  3:17 PM  Result Value Ref Range Status   Specimen Description   Final    BLOOD RIGHT Performed at [place] [address] [place], [place] [address]    Special Requests   Final    IN PEDIATRIC BOTTLE Blood Culture results may not be optimal due to an excessive volume of blood received in culture bottles Performed at [place] [address] [place], [place] [address]    Culture   Final    NO GROWTH 5 DAYS Performed at [facility], [place] [address], [place], [place] [address]    Report Status 06/12/2017 FINAL  Final  Urine culture     Status: Abnormal   Collection Time: 06/07/17  7:30 PM  Result Value Ref Range Status   Specimen Description   Final    URINE, RANDOM Performed at [place] [address] [place], [place] [address]    Special Requests   Final    NONE Performed at [facility], [place] [address] [place] [address], [place], [place] [address]    Culture (A)  Final    >=100,000 COLONIES/mL KLEBSIELLA PNEUMONIAE >=100,000 COLONIES/mL AEROCOCCUS URINAE Standardized susceptibility testing for this organism is not available. Performed at [facility], [place] [address], [place], [place] [address]    Report Status 06/10/2017 FINAL  Final   Organism ID, Bacteria KLEBSIELLA PNEUMONIAE (A)  Final      Susceptibility   Klebsiella pneumoniae - MIC*    AMPICILLIN >=32 RESISTANT Resistant     CEFAZOLIN <=4 SENSITIVE Sensitive     CEFTRIAXONE <=1 SENSITIVE  Sensitive     CIPROFLOXACIN <=0.25 SENSITIVE Sensitive     GENTAMICIN <=1 SENSITIVE Sensitive     IMIPENEM <=0.25 SENSITIVE Sensitive     NITROFURANTOIN 32 SENSITIVE Sensitive     TRIMETH/SULFA <=20 SENSITIVE Sensitive     AMPICILLIN/SULBACTAM 8 SENSITIVE Sensitive  PIP/TAZO <=4 SENSITIVE Sensitive     Extended ESBL NEGATIVE Sensitive     * >=100,000 COLONIES/mL KLEBSIELLA PNEUMONIAE  Respiratory Panel by PCR     Status: None   Collection Time: 06/09/17  5:41 AM  Result Value Ref Range Status   Adenovirus NOT DETECTED NOT DETECTED Final   Coronavirus 229E NOT DETECTED NOT DETECTED Final   Coronavirus HKU1 NOT DETECTED NOT DETECTED Final   Coronavirus NL63 NOT DETECTED NOT DETECTED Final   Coronavirus OC43 NOT DETECTED NOT DETECTED Final   Metapneumovirus NOT DETECTED NOT DETECTED Final   Rhinovirus / Enterovirus NOT DETECTED NOT DETECTED Final   Influenza A NOT DETECTED NOT DETECTED Final   Influenza B NOT DETECTED NOT DETECTED Final   Parainfluenza Virus 1 NOT DETECTED NOT DETECTED Final   Parainfluenza Virus 2 NOT DETECTED NOT DETECTED Final   Parainfluenza Virus 3 NOT DETECTED NOT DETECTED Final   Parainfluenza Virus 4 NOT DETECTED NOT DETECTED Final   Respiratory Syncytial Virus NOT DETECTED NOT DETECTED Final   Bordetella pertussis NOT DETECTED NOT DETECTED Final   Chlamydophila pneumoniae NOT DETECTED NOT DETECTED Final   Mycoplasma pneumoniae NOT DETECTED NOT DETECTED Final     Time coordinating discharge: Over 30 minutes  SIGNED:   Marene Lenz, MD  [facility] 06/13/2017, 11:08 AM Pager [phone]  If 7PM-7AM, please contact night-coverage [email or web address] Password [email or web address]

## 2017-06-13 NOTE — Care Management Note (Addendum)
Case Management Note  Patient Details  Name: Kelsey Joseph MRN: [id number] Date of Birth: [birth year]  Subjective/Objective:  CHF, PNA, UTI               Action/Plan: NCM spoke to dtr, [name] # [phone] via phone, pt dc home. Pt lives alone. Her dtr, Ginger from [place] will be here for two weeks to assist at home. Offered choice for [facility], dtr agreeable to [facility]. Pt has a scooter at home. The family have been working on home to make handicap accessible. She has a Rx for a specialty scooter that has hydraulics. Explained she will need to call her secondary insurance to inquire about providers for device. Explained Medicare does cover speciality devices but if she has a copay her secondary insurance will need to be in network with provider. Pt has a [place] policy that will cover ALF. Explained to review policy as it may also cover in home aide. [place] with new referral. Provided dtr with my contact number for additional questions. States they have Filer set up in several rooms as pt does not have a signal for medical alert system. They will having a discussion with pt about long-term plans, Home vs ALF.    Expected Discharge Date:  06/13/17               Expected Discharge Plan:  [place]  In-House Referral:  Clinical Social Work  Discharge planning Services  CM Consult  Post Acute Care Choice:  Home Health Choice offered to:  Patient  DME Arranged:  N/A DME Agency:  NA  HH Arranged:  PT, OT HH Agency:  [facility]  Status of Service:  Completed, signed off  If discussed at [place] of Stay Meetings, dates discussed:    Additional Comments:  Erenest Rasher, RN 06/13/2017, 2:20 PM

## 2017-06-13 NOTE — Progress Notes (Signed)
Call placed to home number, left message for patient that her white phone charger was found in the room after her discharge and will be placed at the front nursing station for her or her daughter to pick up.

## 2017-06-21 ENCOUNTER — Other Ambulatory Visit: Payer: Self-pay | Admitting: Cardiovascular Disease

## 2017-06-21 NOTE — Telephone Encounter (Signed)
REFILL 

## 2017-06-22 DIAGNOSIS — I10 Essential (primary) hypertension: Secondary | ICD-10-CM | POA: Diagnosis not present

## 2017-06-22 DIAGNOSIS — I4891 Unspecified atrial fibrillation: Secondary | ICD-10-CM | POA: Diagnosis not present

## 2017-06-22 DIAGNOSIS — Z09 Encounter for follow-up examination after completed treatment for conditions other than malignant neoplasm: Secondary | ICD-10-CM | POA: Diagnosis not present

## 2017-06-22 DIAGNOSIS — E119 Type 2 diabetes mellitus without complications: Secondary | ICD-10-CM | POA: Diagnosis not present

## 2017-06-24 ENCOUNTER — Ambulatory Visit (INDEPENDENT_AMBULATORY_CARE_PROVIDER_SITE_OTHER)
Admit: 2017-06-24 | Discharge: 2017-06-24 | Disposition: A | Discharge status: Home / Self Care | Payer: Medicare Other | Attending: Pulmonary Disease | Admitting: Pulmonary Disease

## 2017-06-24 DIAGNOSIS — J189 Pneumonia, unspecified organism: Secondary | ICD-10-CM | POA: Diagnosis not present

## 2017-06-24 DIAGNOSIS — J181 Lobar pneumonia, unspecified organism: Secondary | ICD-10-CM | POA: Diagnosis not present

## 2017-06-24 DIAGNOSIS — N390 Urinary tract infection, site not specified: Secondary | ICD-10-CM | POA: Diagnosis not present

## 2017-06-24 DIAGNOSIS — E119 Type 2 diabetes mellitus without complications: Secondary | ICD-10-CM | POA: Diagnosis not present

## 2017-06-24 DIAGNOSIS — D509 Iron deficiency anemia, unspecified: Secondary | ICD-10-CM | POA: Diagnosis not present

## 2017-06-24 DIAGNOSIS — E7800 Pure hypercholesterolemia, unspecified: Secondary | ICD-10-CM | POA: Diagnosis not present

## 2017-06-30 ENCOUNTER — Encounter: Payer: Self-pay | Admitting: Cardiovascular Disease

## 2017-06-30 ENCOUNTER — Ambulatory Visit (INDEPENDENT_AMBULATORY_CARE_PROVIDER_SITE_OTHER): Payer: Medicare Other | Admitting: Cardiovascular Disease

## 2017-06-30 VITALS — BP 120/58 | HR 66 | Ht 63.0 in | Wt 133.0 lb

## 2017-06-30 DIAGNOSIS — I480 Paroxysmal atrial fibrillation: Secondary | ICD-10-CM | POA: Diagnosis not present

## 2017-06-30 DIAGNOSIS — E7800 Pure hypercholesterolemia, unspecified: Secondary | ICD-10-CM | POA: Diagnosis not present

## 2017-06-30 DIAGNOSIS — I10 Essential (primary) hypertension: Secondary | ICD-10-CM

## 2017-06-30 DIAGNOSIS — I208 Other forms of angina pectoris: Secondary | ICD-10-CM

## 2017-06-30 DIAGNOSIS — E119 Type 2 diabetes mellitus without complications: Secondary | ICD-10-CM | POA: Diagnosis not present

## 2017-06-30 DIAGNOSIS — I495 Sick sinus syndrome: Secondary | ICD-10-CM | POA: Diagnosis not present

## 2017-06-30 DIAGNOSIS — I5032 Chronic diastolic (congestive) heart failure: Secondary | ICD-10-CM | POA: Diagnosis not present

## 2017-06-30 DIAGNOSIS — I25118 Atherosclerotic heart disease of native coronary artery with other forms of angina pectoris: Secondary | ICD-10-CM | POA: Diagnosis not present

## 2017-06-30 DIAGNOSIS — E785 Hyperlipidemia, unspecified: Secondary | ICD-10-CM | POA: Diagnosis not present

## 2017-06-30 DIAGNOSIS — D500 Iron deficiency anemia secondary to blood loss (chronic): Secondary | ICD-10-CM | POA: Diagnosis not present

## 2017-06-30 DIAGNOSIS — Z950 Presence of cardiac pacemaker: Secondary | ICD-10-CM | POA: Diagnosis not present

## 2017-06-30 DIAGNOSIS — D509 Iron deficiency anemia, unspecified: Secondary | ICD-10-CM | POA: Diagnosis not present

## 2017-06-30 DIAGNOSIS — N390 Urinary tract infection, site not specified: Secondary | ICD-10-CM | POA: Diagnosis not present

## 2017-06-30 MED ORDER — ROSUVASTATIN CALCIUM 20 MG PO TABS: 20.0000 mg | ORAL_TABLET | Freq: Every day | ORAL | 3 refills | Status: DC

## 2017-06-30 NOTE — Patient Instructions (Addendum)
Dr Sallyanne Kuster has recommended making the following medication changes: 1. STOP Pravastatin 2. START Rosuvastatin 20 mg daily  Your physician recommends that you return for lab work in 3 months - FASTING.  Remote monitoring is used to monitor your Pacemaker of ICD from home. This monitoring reduces the number of office visits required to check your device to one time per year. It allows [place] to keep an eye on the functioning of your device to ensure it is working properly. You are scheduled for a device check from home on Monday, May 20th, 2019. You may send your transmission at any time that day. If you have a wireless device, the transmission will be sent automatically. After your physician reviews your transmission, you will receive a postcard with your next transmission date.  Dr Sallyanne Kuster recommends that you schedule a follow-up appointment in 6 months with a pacemaker check. You will receive a reminder letter in the mail two months in advance. If you don't receive a letter, please call our office to schedule the follow-up appointment.  If you need a refill on your cardiac medications before your next appointment, please call your pharmacy.
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
## 2017-07-02 ENCOUNTER — Encounter: Payer: Self-pay | Admitting: Cardiovascular Disease

## 2017-07-07 ENCOUNTER — Telehealth: Payer: Self-pay | Admitting: Cardiovascular Disease

## 2017-07-07 MED ORDER — ROSUVASTATIN CALCIUM 20 MG PO TABS: 20.0000 mg | ORAL_TABLET | Freq: Every day | ORAL | 3 refills | Status: DC

## 2017-07-07 NOTE — Telephone Encounter (Signed)
Per pt's chart a 90 day supply with refills was sent to [facility] on 06/30/17. Spoke with pt to get clarification of her pharmacies. Pt states she would like all her 90 day supplies to go to [facility], but if she is trying a new medication she request a 10 day supply to be sent to [facility]. Pt already picked up 10 day supply from [facility] but she is running out of medication. She states she will try to obtain another 10 day supply from local pharmacy while she waits for mail order.

## 2017-07-07 NOTE — Telephone Encounter (Signed)
NEW MESSAGE     *STAT* If patient is at the pharmacy, call can be transferred to refill team.   1. Which medications need to be refilled? (please list name of each medication and dose if known) rosuvastatin (CRESTOR) 20 MG tablet  2. Which pharmacy/location (including street and city if local pharmacy) is medication to be sent to?[facility] [place], [place]  3. Do they need a 30 day or 90 day supply? 90    *STAT* If patient is at the pharmacy, call can be transferred to refill team.   1. Which medications need to be refilled? (please list name of each medication and dose if known) [place], [place]  2. Which pharmacy/location (including street and city if local pharmacy) is medication to be sent to?[facility] [address] [place], [place]  3. Do they need a 30 day or 90 day supply? Patient requesting short term supply to local while waiting on mailorder

## 2017-07-08 DIAGNOSIS — E119 Type 2 diabetes mellitus without complications: Secondary | ICD-10-CM | POA: Diagnosis not present

## 2017-07-08 DIAGNOSIS — I10 Essential (primary) hypertension: Secondary | ICD-10-CM | POA: Diagnosis not present

## 2017-07-15 DIAGNOSIS — E119 Type 2 diabetes mellitus without complications: Secondary | ICD-10-CM | POA: Diagnosis not present

## 2017-07-15 DIAGNOSIS — I10 Essential (primary) hypertension: Secondary | ICD-10-CM | POA: Diagnosis not present

## 2017-07-22 DIAGNOSIS — E119 Type 2 diabetes mellitus without complications: Secondary | ICD-10-CM | POA: Diagnosis not present

## 2017-07-22 DIAGNOSIS — I10 Essential (primary) hypertension: Secondary | ICD-10-CM | POA: Diagnosis not present

## 2017-07-26 ENCOUNTER — Ambulatory Visit (INDEPENDENT_AMBULATORY_CARE_PROVIDER_SITE_OTHER): Payer: Medicare Other | Admitting: Pulmonary Disease

## 2017-07-26 ENCOUNTER — Encounter: Payer: Self-pay | Admitting: Pulmonary Disease

## 2017-07-26 VITALS — BP 124/70 | HR 68 | Ht 63.0 in | Wt 134.0 lb

## 2017-07-26 DIAGNOSIS — J189 Pneumonia, unspecified organism: Secondary | ICD-10-CM

## 2017-07-26 DIAGNOSIS — I208 Other forms of angina pectoris: Secondary | ICD-10-CM

## 2017-07-26 NOTE — Patient Instructions (Signed)
I am glad that you are feeling better The last CT scan shows that the pneumonia is improving We will follow-up in 1-2 months with a chest x-ray.  Follow-up in clinic after chest x-ray.
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
## 2017-08-23 ENCOUNTER — Ambulatory Visit (INDEPENDENT_AMBULATORY_CARE_PROVIDER_SITE_OTHER): Payer: Medicare Other | Admitting: *Deleted

## 2017-08-23 DIAGNOSIS — I495 Sick sinus syndrome: Secondary | ICD-10-CM

## 2017-08-24 ENCOUNTER — Encounter: Payer: Self-pay | Admitting: Cardiology

## 2017-08-24 LAB — CUP PACEART REMOTE DEVICE CHECK
Battery Remaining Longevity: 75 mo
Brady Statistic AP VS Percent: 86.65 %
Brady Statistic RA Percent Paced: 94.05 %
Brady Statistic RV Percent Paced: 12.82 %
Date Time Interrogation Session: 20190520175101
Implantable Lead Implant Date: 20151215
Implantable Lead Location: 753859
Implantable Lead Location: 753860
Implantable Lead Model: 5076
Implantable Pulse Generator Implant Date: 20151215
Lead Channel Impedance Value: 399 Ohm
Lead Channel Impedance Value: 494 Ohm
Lead Channel Pacing Threshold Pulse Width: 0.4 ms
Lead Channel Sensing Intrinsic Amplitude: 1.5 mV
Lead Channel Sensing Intrinsic Amplitude: 14.875 mV
Lead Channel Setting Pacing Amplitude: 2 V
Lead Channel Setting Pacing Pulse Width: 0.4 ms
Lead Channel Setting Sensing Sensitivity: 2 mV
MDC IDC LEAD IMPLANT DT: 20151215
MDC IDC MSMT BATTERY VOLTAGE: 3.01 V
MDC IDC MSMT LEADCHNL RA IMPEDANCE VALUE: 399 Ohm
MDC IDC MSMT LEADCHNL RA PACING THRESHOLD AMPLITUDE: 0.625 V
MDC IDC MSMT LEADCHNL RA SENSING INTR AMPL: 1.5 mV
MDC IDC MSMT LEADCHNL RV IMPEDANCE VALUE: 361 Ohm
MDC IDC MSMT LEADCHNL RV PACING THRESHOLD AMPLITUDE: 1 V
MDC IDC MSMT LEADCHNL RV PACING THRESHOLD PULSEWIDTH: 0.4 ms
MDC IDC MSMT LEADCHNL RV SENSING INTR AMPL: 14.875 mV
MDC IDC SET LEADCHNL RA PACING AMPLITUDE: 1.5 V
MDC IDC STAT BRADY AP VP PERCENT: 8.31 %
MDC IDC STAT BRADY AS VP PERCENT: 4.16 %
MDC IDC STAT BRADY AS VS PERCENT: 0.88 %

## 2017-08-24 NOTE — Progress Notes (Signed)
Remote pacemaker transmission.   

## 2017-08-24 NOTE — Progress Notes (Signed)
Letter  

## 2017-08-29 ENCOUNTER — Other Ambulatory Visit: Payer: Self-pay | Admitting: Cardiovascular Disease

## 2017-08-31 ENCOUNTER — Encounter: Payer: Self-pay | Admitting: Pulmonary Disease

## 2017-08-31 ENCOUNTER — Ambulatory Visit (INDEPENDENT_AMBULATORY_CARE_PROVIDER_SITE_OTHER)
Admission: RE | Admit: 2017-08-31 | Discharge: 2017-08-31 | Disposition: A | Discharge status: Home / Self Care | Payer: Medicare Other | Source: Ambulatory Visit | Attending: Pulmonary Disease | Admitting: Pulmonary Disease

## 2017-08-31 ENCOUNTER — Ambulatory Visit (INDEPENDENT_AMBULATORY_CARE_PROVIDER_SITE_OTHER): Payer: Medicare Other | Admitting: Pulmonary Disease

## 2017-08-31 VITALS — BP 118/68 | HR 82 | Ht 63.0 in | Wt 134.6 lb

## 2017-08-31 DIAGNOSIS — J189 Pneumonia, unspecified organism: Secondary | ICD-10-CM

## 2017-08-31 DIAGNOSIS — I208 Other forms of angina pectoris: Secondary | ICD-10-CM | POA: Diagnosis not present

## 2017-08-31 NOTE — Telephone Encounter (Signed)
Rx sent to pharmacy   

## 2017-08-31 NOTE — Patient Instructions (Signed)
His chest x-ray looks improved I am glad that the symptoms are better You can follow-up with our clinic as needed
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
## 2017-09-02 ENCOUNTER — Telehealth: Payer: Self-pay | Admitting: Cardiovascular Disease

## 2017-09-02 DIAGNOSIS — R079 Chest pain, unspecified: Secondary | ICD-10-CM | POA: Diagnosis not present

## 2017-09-02 DIAGNOSIS — R112 Nausea with vomiting, unspecified: Secondary | ICD-10-CM | POA: Diagnosis not present

## 2017-09-02 DIAGNOSIS — R0789 Other chest pain: Secondary | ICD-10-CM | POA: Diagnosis not present

## 2017-09-02 DIAGNOSIS — I959 Hypotension, unspecified: Secondary | ICD-10-CM | POA: Diagnosis not present

## 2017-09-02 NOTE — Telephone Encounter (Signed)
Will stick with that plan - should go to ED for chest pain/pressure that lasts > 30 minutes MCr

## 2017-09-02 NOTE — Telephone Encounter (Signed)
Spoke with pt who states last night as she was laying down reading she developed and aching pain in both arms and dull feeling in her chest. Pt reports she took 2 nitro with no relief and contacted EMS. Upon arrival she was instructed to take a 3rd nitro. She did not go to the ED but was advised to call her cardiologist. Appointment was scheduled for 09/06/17 with Almyra Deforest, PA. Pt also instructed to contact EMS again if pain reoccur. Verbalized understanding. Routed to Dr. Sallyanne Kuster for further recommendation.

## 2017-09-02 NOTE — Telephone Encounter (Signed)
Spoke with pt and advised of Dr. C's recommendation. Pt verbalized understanding.  

## 2017-09-06 ENCOUNTER — Encounter: Payer: Self-pay | Admitting: Physician Assistant

## 2017-09-06 ENCOUNTER — Ambulatory Visit (INDEPENDENT_AMBULATORY_CARE_PROVIDER_SITE_OTHER): Payer: Medicare Other | Admitting: Physician Assistant

## 2017-09-06 VITALS — BP 120/54 | Ht 63.0 in | Wt 130.8 lb

## 2017-09-06 DIAGNOSIS — I208 Other forms of angina pectoris: Secondary | ICD-10-CM

## 2017-09-06 DIAGNOSIS — R079 Chest pain, unspecified: Secondary | ICD-10-CM | POA: Diagnosis not present

## 2017-09-06 DIAGNOSIS — I10 Essential (primary) hypertension: Secondary | ICD-10-CM

## 2017-09-06 DIAGNOSIS — I495 Sick sinus syndrome: Secondary | ICD-10-CM | POA: Diagnosis not present

## 2017-09-06 DIAGNOSIS — Z950 Presence of cardiac pacemaker: Secondary | ICD-10-CM | POA: Diagnosis not present

## 2017-09-06 DIAGNOSIS — I5032 Chronic diastolic (congestive) heart failure: Secondary | ICD-10-CM | POA: Diagnosis not present

## 2017-09-06 DIAGNOSIS — I480 Paroxysmal atrial fibrillation: Secondary | ICD-10-CM | POA: Diagnosis not present

## 2017-09-06 MED ORDER — ISOSORBIDE MONONITRATE ER 30 MG PO TB24: ORAL_TABLET | ORAL | 3 refills | Status: DC
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
## 2017-09-06 NOTE — Patient Instructions (Addendum)
Medication Instructions:  START IMDUR (ISOSORBIDE) 30 MG 1/2 TABLET DAILY   Labwork: NONE  Testing/Procedures: NONE  Follow-Up: Your physician recommends that you schedule a follow-up appointment in: 2-3 [place] PA/DR CROITORU  If you need a refill on your cardiac medications before your next appointment, please call your pharmacy.

## 2017-09-10 NOTE — Progress Notes (Signed)
Thank you, I agree. 

## 2017-09-16 DIAGNOSIS — E119 Type 2 diabetes mellitus without complications: Secondary | ICD-10-CM | POA: Diagnosis not present

## 2017-09-17 DIAGNOSIS — M19012 Primary osteoarthritis, left shoulder: Secondary | ICD-10-CM | POA: Diagnosis not present

## 2017-09-17 DIAGNOSIS — M19011 Primary osteoarthritis, right shoulder: Secondary | ICD-10-CM | POA: Diagnosis not present

## 2017-09-20 ENCOUNTER — Other Ambulatory Visit: Payer: Self-pay | Admitting: Cardiovascular Disease

## 2017-09-20 NOTE — Telephone Encounter (Signed)
Rx(s) sent to pharmacy electronically.  

## 2017-09-21 ENCOUNTER — Encounter: Payer: Self-pay | Admitting: Physician Assistant

## 2017-09-21 ENCOUNTER — Ambulatory Visit (INDEPENDENT_AMBULATORY_CARE_PROVIDER_SITE_OTHER): Payer: Medicare Other | Admitting: Physician Assistant

## 2017-09-21 VITALS — BP 116/68 | HR 69 | Ht 63.0 in | Wt 129.0 lb

## 2017-09-21 DIAGNOSIS — Z950 Presence of cardiac pacemaker: Secondary | ICD-10-CM | POA: Diagnosis not present

## 2017-09-21 DIAGNOSIS — R079 Chest pain, unspecified: Secondary | ICD-10-CM

## 2017-09-21 DIAGNOSIS — I10 Essential (primary) hypertension: Secondary | ICD-10-CM | POA: Diagnosis not present

## 2017-09-21 DIAGNOSIS — I208 Other forms of angina pectoris: Secondary | ICD-10-CM | POA: Diagnosis not present

## 2017-09-21 DIAGNOSIS — I5032 Chronic diastolic (congestive) heart failure: Secondary | ICD-10-CM | POA: Diagnosis not present

## 2017-09-21 DIAGNOSIS — I480 Paroxysmal atrial fibrillation: Secondary | ICD-10-CM | POA: Diagnosis not present
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
## 2017-09-21 NOTE — Patient Instructions (Signed)
Medication Instructions: Your physician recommends that you continue on your current medications as directed.    If you need a refill on your cardiac medications before your next appointment, please call your pharmacy.     Follow-Up: Your physician wants you to follow-up in 3 months with Dr. Abbie Sons   Special Instructions:    Thank you for choosing Heartcare at [facility]!!

## 2017-09-23 DIAGNOSIS — I10 Essential (primary) hypertension: Secondary | ICD-10-CM | POA: Diagnosis not present

## 2017-09-23 DIAGNOSIS — E119 Type 2 diabetes mellitus without complications: Secondary | ICD-10-CM | POA: Diagnosis not present

## 2017-10-08 LAB — CUP PACEART INCLINIC DEVICE CHECK
Date Time Interrogation Session: 20190705143314
Implantable Lead Implant Date: 20151215
Implantable Lead Location: 753859
Implantable Lead Model: 5076
Lead Channel Setting Pacing Amplitude: 1.5 V
Lead Channel Setting Pacing Amplitude: 2 V
Lead Channel Setting Pacing Pulse Width: 0.4 ms
MDC IDC LEAD IMPLANT DT: 20151215
MDC IDC LEAD LOCATION: 753860
MDC IDC PG IMPLANT DT: 20151215
MDC IDC SET LEADCHNL RV SENSING SENSITIVITY: 2 mV

## 2017-11-10 DIAGNOSIS — N958 Other specified menopausal and perimenopausal disorders: Secondary | ICD-10-CM | POA: Diagnosis not present

## 2017-11-10 DIAGNOSIS — Z1231 Encounter for screening mammogram for malignant neoplasm of breast: Secondary | ICD-10-CM | POA: Diagnosis not present

## 2017-11-10 DIAGNOSIS — Z6824 Body mass index (BMI) 24.0-24.9, adult: Secondary | ICD-10-CM | POA: Diagnosis not present

## 2017-11-10 DIAGNOSIS — Z01419 Encounter for gynecological examination (general) (routine) without abnormal findings: Secondary | ICD-10-CM | POA: Diagnosis not present

## 2017-11-22 ENCOUNTER — Ambulatory Visit (INDEPENDENT_AMBULATORY_CARE_PROVIDER_SITE_OTHER): Payer: Medicare Other | Admitting: *Deleted

## 2017-11-22 DIAGNOSIS — I495 Sick sinus syndrome: Secondary | ICD-10-CM

## 2017-11-23 NOTE — Progress Notes (Signed)
Remote pacemaker transmission.   

## 2017-11-25 DIAGNOSIS — N390 Urinary tract infection, site not specified: Secondary | ICD-10-CM | POA: Diagnosis not present

## 2017-11-25 DIAGNOSIS — E039 Hypothyroidism, unspecified: Secondary | ICD-10-CM | POA: Diagnosis not present

## 2017-11-25 DIAGNOSIS — I10 Essential (primary) hypertension: Secondary | ICD-10-CM | POA: Diagnosis not present

## 2017-11-25 DIAGNOSIS — E119 Type 2 diabetes mellitus without complications: Secondary | ICD-10-CM | POA: Diagnosis not present

## 2017-11-25 DIAGNOSIS — B0229 Other postherpetic nervous system involvement: Secondary | ICD-10-CM | POA: Diagnosis not present

## 2017-11-25 DIAGNOSIS — E7800 Pure hypercholesterolemia, unspecified: Secondary | ICD-10-CM | POA: Diagnosis not present

## 2017-11-28 ENCOUNTER — Other Ambulatory Visit: Payer: Self-pay | Admitting: Cardiovascular Disease

## 2017-11-30 DIAGNOSIS — E119 Type 2 diabetes mellitus without complications: Secondary | ICD-10-CM | POA: Diagnosis not present

## 2017-11-30 DIAGNOSIS — I4891 Unspecified atrial fibrillation: Secondary | ICD-10-CM | POA: Diagnosis not present

## 2017-11-30 DIAGNOSIS — I10 Essential (primary) hypertension: Secondary | ICD-10-CM | POA: Diagnosis not present

## 2017-11-30 DIAGNOSIS — R600 Localized edema: Secondary | ICD-10-CM | POA: Diagnosis not present

## 2017-11-30 DIAGNOSIS — Z0000 Encounter for general adult medical examination without abnormal findings: Secondary | ICD-10-CM | POA: Diagnosis not present

## 2017-11-30 DIAGNOSIS — E663 Overweight: Secondary | ICD-10-CM | POA: Diagnosis not present

## 2017-11-30 DIAGNOSIS — R208 Other disturbances of skin sensation: Secondary | ICD-10-CM | POA: Diagnosis not present

## 2017-11-30 DIAGNOSIS — D509 Iron deficiency anemia, unspecified: Secondary | ICD-10-CM | POA: Diagnosis not present

## 2017-11-30 DIAGNOSIS — E7800 Pure hypercholesterolemia, unspecified: Secondary | ICD-10-CM | POA: Diagnosis not present

## 2017-11-30 DIAGNOSIS — K5730 Diverticulosis of large intestine without perforation or abscess without bleeding: Secondary | ICD-10-CM | POA: Diagnosis not present

## 2017-11-30 DIAGNOSIS — D539 Nutritional anemia, unspecified: Secondary | ICD-10-CM | POA: Diagnosis not present

## 2017-12-02 DIAGNOSIS — Z961 Presence of intraocular lens: Secondary | ICD-10-CM | POA: Diagnosis not present

## 2017-12-02 DIAGNOSIS — H04123 Dry eye syndrome of bilateral lacrimal glands: Secondary | ICD-10-CM | POA: Diagnosis not present

## 2017-12-02 DIAGNOSIS — H401131 Primary open-angle glaucoma, bilateral, mild stage: Secondary | ICD-10-CM | POA: Diagnosis not present

## 2017-12-14 DIAGNOSIS — M19211 Secondary osteoarthritis, right shoulder: Secondary | ICD-10-CM | POA: Diagnosis not present

## 2017-12-17 LAB — CUP PACEART REMOTE DEVICE CHECK
Battery Voltage: 3 V
Brady Statistic AS VP Percent: 3.45 %
Brady Statistic RA Percent Paced: 93.9 %
Brady Statistic RV Percent Paced: 13.16 %
Date Time Interrogation Session: 20190819135546
Implantable Lead Implant Date: 20151215
Implantable Lead Location: 753860
Implantable Lead Model: 5076
Implantable Pulse Generator Implant Date: 20151215
Lead Channel Impedance Value: 380 Ohm
Lead Channel Impedance Value: 418 Ohm
Lead Channel Impedance Value: 494 Ohm
Lead Channel Pacing Threshold Amplitude: 0.875 V
Lead Channel Pacing Threshold Pulse Width: 0.4 ms
Lead Channel Sensing Intrinsic Amplitude: 1.625 mV
Lead Channel Sensing Intrinsic Amplitude: 16.25 mV
Lead Channel Setting Pacing Amplitude: 2 V
Lead Channel Setting Pacing Pulse Width: 0.4 ms
Lead Channel Setting Sensing Sensitivity: 2 mV
MDC IDC LEAD IMPLANT DT: 20151215
MDC IDC LEAD LOCATION: 753859
MDC IDC MSMT BATTERY REMAINING LONGEVITY: 72 mo
MDC IDC MSMT LEADCHNL RA IMPEDANCE VALUE: 418 Ohm
MDC IDC MSMT LEADCHNL RA PACING THRESHOLD AMPLITUDE: 0.75 V
MDC IDC MSMT LEADCHNL RA SENSING INTR AMPL: 1.625 mV
MDC IDC MSMT LEADCHNL RV PACING THRESHOLD PULSEWIDTH: 0.4 ms
MDC IDC MSMT LEADCHNL RV SENSING INTR AMPL: 16.25 mV
MDC IDC SET LEADCHNL RV PACING AMPLITUDE: 2 V
MDC IDC STAT BRADY AP VP PERCENT: 9.34 %
MDC IDC STAT BRADY AP VS PERCENT: 85.88 %
MDC IDC STAT BRADY AS VS PERCENT: 1.33 %

## 2017-12-21 DIAGNOSIS — D3615 Benign neoplasm of peripheral nerves and autonomic nervous system of abdomen: Secondary | ICD-10-CM | POA: Diagnosis not present

## 2017-12-21 DIAGNOSIS — L821 Other seborrheic keratosis: Secondary | ICD-10-CM | POA: Diagnosis not present

## 2017-12-21 DIAGNOSIS — D692 Other nonthrombocytopenic purpura: Secondary | ICD-10-CM | POA: Diagnosis not present

## 2017-12-21 DIAGNOSIS — D1801 Hemangioma of skin and subcutaneous tissue: Secondary | ICD-10-CM | POA: Diagnosis not present

## 2017-12-21 DIAGNOSIS — Z85828 Personal history of other malignant neoplasm of skin: Secondary | ICD-10-CM | POA: Diagnosis not present

## 2017-12-21 DIAGNOSIS — L812 Freckles: Secondary | ICD-10-CM | POA: Diagnosis not present

## 2017-12-30 ENCOUNTER — Encounter: Payer: Self-pay | Admitting: Cardiovascular Disease

## 2017-12-30 ENCOUNTER — Ambulatory Visit (INDEPENDENT_AMBULATORY_CARE_PROVIDER_SITE_OTHER): Payer: Medicare Other | Admitting: Cardiovascular Disease

## 2017-12-30 VITALS — BP 100/60 | HR 79 | Ht 63.0 in | Wt 126.0 lb

## 2017-12-30 DIAGNOSIS — E7800 Pure hypercholesterolemia, unspecified: Secondary | ICD-10-CM

## 2017-12-30 DIAGNOSIS — I495 Sick sinus syndrome: Secondary | ICD-10-CM | POA: Diagnosis not present

## 2017-12-30 DIAGNOSIS — D500 Iron deficiency anemia secondary to blood loss (chronic): Secondary | ICD-10-CM | POA: Diagnosis not present

## 2017-12-30 DIAGNOSIS — I5032 Chronic diastolic (congestive) heart failure: Secondary | ICD-10-CM | POA: Diagnosis not present

## 2017-12-30 DIAGNOSIS — I25118 Atherosclerotic heart disease of native coronary artery with other forms of angina pectoris: Secondary | ICD-10-CM

## 2017-12-30 DIAGNOSIS — Z950 Presence of cardiac pacemaker: Secondary | ICD-10-CM

## 2017-12-30 DIAGNOSIS — I480 Paroxysmal atrial fibrillation: Secondary | ICD-10-CM | POA: Diagnosis not present

## 2017-12-30 DIAGNOSIS — I208 Other forms of angina pectoris: Secondary | ICD-10-CM

## 2017-12-30 DIAGNOSIS — I10 Essential (primary) hypertension: Secondary | ICD-10-CM

## 2017-12-30 NOTE — Patient Instructions (Signed)
Dr Sallyanne Kuster recommends that you continue on your current medications as directed. Please refer to the Current Medication list given to you today.  Your physician recommends that you schedule a follow-up appointment in 3 months with Almyra Deforest, PA.  Dr Sallyanne Kuster recommends that you schedule a follow-up appointment in 6 months with a pacemaker check. You will receive a reminder letter in the mail two months in advance. If you don't receive a letter, please call our office to schedule the follow-up appointment.  If you need a refill on your cardiac medications before your next appointment, please call your pharmacy.
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
## 2018-01-01 DIAGNOSIS — Z23 Encounter for immunization: Secondary | ICD-10-CM | POA: Diagnosis not present

## 2018-01-02 ENCOUNTER — Encounter: Payer: Self-pay | Admitting: Cardiovascular Disease

## 2018-01-02 DIAGNOSIS — I2510 Atherosclerotic heart disease of native coronary artery without angina pectoris: Secondary | ICD-10-CM | POA: Insufficient documentation

## 2018-01-12 DIAGNOSIS — M75122 Complete rotator cuff tear or rupture of left shoulder, not specified as traumatic: Secondary | ICD-10-CM | POA: Diagnosis not present

## 2018-01-12 DIAGNOSIS — M75121 Complete rotator cuff tear or rupture of right shoulder, not specified as traumatic: Secondary | ICD-10-CM | POA: Diagnosis not present

## 2018-01-20 LAB — CUP PACEART INCLINIC DEVICE CHECK
Battery Remaining Longevity: 70 mo
Battery Voltage: 3 V
Brady Statistic RA Percent Paced: 93.26 %
Brady Statistic RV Percent Paced: 17.59 %
Implantable Lead Implant Date: 20151215
Implantable Lead Location: 753859
Implantable Lead Model: 5076
Implantable Pulse Generator Implant Date: 20151215
Lead Channel Impedance Value: 380 Ohm
Lead Channel Impedance Value: 399 Ohm
Lead Channel Impedance Value: 418 Ohm
Lead Channel Impedance Value: 494 Ohm
Lead Channel Sensing Intrinsic Amplitude: 22.375 mV
Lead Channel Setting Pacing Amplitude: 2 V
Lead Channel Setting Pacing Amplitude: 2 V
Lead Channel Setting Pacing Pulse Width: 0.4 ms
Lead Channel Setting Sensing Sensitivity: 2 mV
MDC IDC LEAD IMPLANT DT: 20151215
MDC IDC LEAD LOCATION: 753860
MDC IDC MSMT LEADCHNL RA PACING THRESHOLD AMPLITUDE: 0.75 V
MDC IDC MSMT LEADCHNL RA PACING THRESHOLD PULSEWIDTH: 0.4 ms
MDC IDC MSMT LEADCHNL RA SENSING INTR AMPL: 1.625 mV
MDC IDC MSMT LEADCHNL RA SENSING INTR AMPL: 1.625 mV
MDC IDC MSMT LEADCHNL RV PACING THRESHOLD AMPLITUDE: 0.875 V
MDC IDC MSMT LEADCHNL RV PACING THRESHOLD PULSEWIDTH: 0.4 ms
MDC IDC MSMT LEADCHNL RV SENSING INTR AMPL: 22.375 mV
MDC IDC SESS DTM: 20190926151358
MDC IDC STAT BRADY AP VP PERCENT: 12.63 %
MDC IDC STAT BRADY AP VS PERCENT: 81.7 %
MDC IDC STAT BRADY AS VP PERCENT: 4.65 %
MDC IDC STAT BRADY AS VS PERCENT: 1.02 %

## 2018-01-31 ENCOUNTER — Encounter

## 2018-01-31 ENCOUNTER — Ambulatory Visit (INDEPENDENT_AMBULATORY_CARE_PROVIDER_SITE_OTHER): Payer: Medicare Other | Admitting: Neurology

## 2018-01-31 ENCOUNTER — Encounter: Payer: Self-pay | Admitting: Neurology

## 2018-01-31 VITALS — BP 98/58 | HR 115

## 2018-01-31 DIAGNOSIS — B0229 Other postherpetic nervous system involvement: Secondary | ICD-10-CM | POA: Diagnosis not present

## 2018-01-31 MED ORDER — LIDOCAINE-PRILOCAINE 2.5-2.5 % EX CREA: 1.0000 "application " | TOPICAL_CREAM | CUTANEOUS | 11 refills | Status: DC | PRN

## 2018-01-31 MED ORDER — DICLOFENAC SODIUM 1 % TD GEL: 4.0000 g | Freq: Four times a day (QID) | TRANSDERMAL | 11 refills | Status: DC | PRN

## 2018-01-31 MED ORDER — PREGABALIN 100 MG PO CAPS: 100.0000 mg | ORAL_CAPSULE | Freq: Three times a day (TID) | ORAL | 5 refills | Status: DC | PRN
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
## 2018-02-08 ENCOUNTER — Observation Stay (HOSPITAL_COMMUNITY)
Admission: EM | Admit: 2018-02-08 | Discharge: 2018-02-10 | Disposition: A | Discharge status: Home / Self Care | Payer: Medicare Other | Attending: Internal Medicine | Admitting: Internal Medicine

## 2018-02-08 ENCOUNTER — Encounter (HOSPITAL_COMMUNITY): Payer: Self-pay | Admitting: Emergency Medicine

## 2018-02-08 ENCOUNTER — Emergency Department (HOSPITAL_COMMUNITY): Payer: Medicare Other

## 2018-02-08 DIAGNOSIS — Z833 Family history of diabetes mellitus: Secondary | ICD-10-CM | POA: Insufficient documentation

## 2018-02-08 DIAGNOSIS — Z9889 Other specified postprocedural states: Secondary | ICD-10-CM | POA: Insufficient documentation

## 2018-02-08 DIAGNOSIS — N183 Chronic kidney disease, stage 3 (moderate): Secondary | ICD-10-CM | POA: Diagnosis not present

## 2018-02-08 DIAGNOSIS — Z882 Allergy status to sulfonamides status: Secondary | ICD-10-CM | POA: Insufficient documentation

## 2018-02-08 DIAGNOSIS — I130 Hypertensive heart and chronic kidney disease with heart failure and stage 1 through stage 4 chronic kidney disease, or unspecified chronic kidney disease: Secondary | ICD-10-CM | POA: Diagnosis not present

## 2018-02-08 DIAGNOSIS — N179 Acute kidney failure, unspecified: Secondary | ICD-10-CM | POA: Diagnosis not present

## 2018-02-08 DIAGNOSIS — R0602 Shortness of breath: Secondary | ICD-10-CM | POA: Diagnosis not present

## 2018-02-08 DIAGNOSIS — M869 Osteomyelitis, unspecified: Secondary | ICD-10-CM | POA: Insufficient documentation

## 2018-02-08 DIAGNOSIS — E119 Type 2 diabetes mellitus without complications: Secondary | ICD-10-CM

## 2018-02-08 DIAGNOSIS — Z7982 Long term (current) use of aspirin: Secondary | ICD-10-CM | POA: Insufficient documentation

## 2018-02-08 DIAGNOSIS — E7800 Pure hypercholesterolemia, unspecified: Secondary | ICD-10-CM | POA: Diagnosis present

## 2018-02-08 DIAGNOSIS — I495 Sick sinus syndrome: Secondary | ICD-10-CM | POA: Insufficient documentation

## 2018-02-08 DIAGNOSIS — I252 Old myocardial infarction: Secondary | ICD-10-CM | POA: Insufficient documentation

## 2018-02-08 DIAGNOSIS — I051 Rheumatic mitral insufficiency: Secondary | ICD-10-CM | POA: Diagnosis not present

## 2018-02-08 DIAGNOSIS — I700 Atherosclerosis of aorta: Secondary | ICD-10-CM | POA: Diagnosis not present

## 2018-02-08 DIAGNOSIS — Z90710 Acquired absence of both cervix and uterus: Secondary | ICD-10-CM | POA: Insufficient documentation

## 2018-02-08 DIAGNOSIS — Z950 Presence of cardiac pacemaker: Secondary | ICD-10-CM | POA: Insufficient documentation

## 2018-02-08 DIAGNOSIS — E039 Hypothyroidism, unspecified: Secondary | ICD-10-CM | POA: Diagnosis present

## 2018-02-08 DIAGNOSIS — I4891 Unspecified atrial fibrillation: Secondary | ICD-10-CM | POA: Diagnosis present

## 2018-02-08 DIAGNOSIS — Z87828 Personal history of other (healed) physical injury and trauma: Secondary | ICD-10-CM | POA: Insufficient documentation

## 2018-02-08 DIAGNOSIS — I480 Paroxysmal atrial fibrillation: Secondary | ICD-10-CM | POA: Insufficient documentation

## 2018-02-08 DIAGNOSIS — G4733 Obstructive sleep apnea (adult) (pediatric): Secondary | ICD-10-CM | POA: Diagnosis not present

## 2018-02-08 DIAGNOSIS — Z87891 Personal history of nicotine dependence: Secondary | ICD-10-CM | POA: Diagnosis not present

## 2018-02-08 DIAGNOSIS — E1140 Type 2 diabetes mellitus with diabetic neuropathy, unspecified: Secondary | ICD-10-CM | POA: Diagnosis not present

## 2018-02-08 DIAGNOSIS — Z888 Allergy status to other drugs, medicaments and biological substances status: Secondary | ICD-10-CM | POA: Insufficient documentation

## 2018-02-08 DIAGNOSIS — I2510 Atherosclerotic heart disease of native coronary artery without angina pectoris: Secondary | ICD-10-CM | POA: Diagnosis not present

## 2018-02-08 DIAGNOSIS — R05 Cough: Secondary | ICD-10-CM | POA: Diagnosis not present

## 2018-02-08 DIAGNOSIS — K219 Gastro-esophageal reflux disease without esophagitis: Secondary | ICD-10-CM | POA: Diagnosis present

## 2018-02-08 DIAGNOSIS — D631 Anemia in chronic kidney disease: Secondary | ICD-10-CM | POA: Insufficient documentation

## 2018-02-08 DIAGNOSIS — Z8249 Family history of ischemic heart disease and other diseases of the circulatory system: Secondary | ICD-10-CM | POA: Insufficient documentation

## 2018-02-08 DIAGNOSIS — I5033 Acute on chronic diastolic (congestive) heart failure: Secondary | ICD-10-CM | POA: Diagnosis not present

## 2018-02-08 DIAGNOSIS — Z96652 Presence of left artificial knee joint: Secondary | ICD-10-CM | POA: Insufficient documentation

## 2018-02-08 DIAGNOSIS — Z89611 Acquired absence of right leg above knee: Secondary | ICD-10-CM | POA: Insufficient documentation

## 2018-02-08 DIAGNOSIS — Z79899 Other long term (current) drug therapy: Secondary | ICD-10-CM | POA: Diagnosis not present

## 2018-02-08 DIAGNOSIS — I420 Dilated cardiomyopathy: Secondary | ICD-10-CM | POA: Diagnosis not present

## 2018-02-08 DIAGNOSIS — R079 Chest pain, unspecified: Secondary | ICD-10-CM | POA: Diagnosis not present

## 2018-02-08 DIAGNOSIS — I10 Essential (primary) hypertension: Secondary | ICD-10-CM | POA: Diagnosis present

## 2018-02-08 DIAGNOSIS — Z79890 Hormone replacement therapy (postmenopausal): Secondary | ICD-10-CM | POA: Insufficient documentation

## 2018-02-08 DIAGNOSIS — Z91040 Latex allergy status: Secondary | ICD-10-CM | POA: Insufficient documentation

## 2018-02-08 LAB — BRAIN NATRIURETIC PEPTIDE: B NATRIURETIC PEPTIDE 5: 456.9 pg/mL — AB (ref 0.0–100.0)

## 2018-02-08 LAB — BASIC METABOLIC PANEL
ANION GAP: 8 (ref 5–15)
BUN: 53 mg/dL — ABNORMAL HIGH (ref 8–23)
CO2: 29 mmol/L (ref 22–32)
CREATININE: 1.27 mg/dL — AB (ref 0.44–1.00)
Calcium: 9.3 mg/dL (ref 8.9–10.3)
Chloride: 104 mmol/L (ref 98–111)
GFR calc Af Amer: 42 mL/min — ABNORMAL LOW (ref >60)
GFR, EST NON AFRICAN AMERICAN: 37 mL/min — AB (ref >60)
Glucose, Bld: 120 mg/dL — ABNORMAL HIGH (ref 70–99)
Potassium: 3.7 mmol/L (ref 3.5–5.1)
SODIUM: 141 mmol/L (ref 135–145)

## 2018-02-08 LAB — CBC
HCT: 39.1 % (ref 36.0–46.0)
Hemoglobin: 12.3 g/dL (ref 12.0–15.0)
MCH: 30.4 pg (ref 26.0–34.0)
MCHC: 31.5 g/dL (ref 30.0–36.0)
MCV: 96.5 fL (ref 80.0–100.0)
NRBC: 0 % (ref 0.0–0.2)
Platelets: 179 10*3/uL (ref 150–400)
RBC: 4.05 MIL/uL (ref 3.87–5.11)
RDW: 14.7 % (ref 11.5–15.5)
WBC: 7.6 10*3/uL (ref 4.0–10.5)

## 2018-02-08 LAB — I-STAT TROPONIN, ED: TROPONIN I, POC: 0.01 ng/mL (ref 0.00–0.08)

## 2018-02-08 NOTE — ED Triage Notes (Signed)
Pt presents with daughter who states patient has CHF and having increased SOB, cough, and dizziness/lightheadedness since Saturday; pt was told to increase diuretic, no improvement; concerned for pneumonia

## 2018-02-08 NOTE — ED Provider Notes (Signed)
Patient placed in Quick Look pathway, seen and evaluated   Chief Complaint: shortness of breath  HPI: Kelsey Joseph is a 84 y.o. female with hx of CHF, HTN, CKD, anemia, CAD and multiple other medical problems as listed in [place]  who presents to the ED with shortness of breath. Patient's daughter reports that the patient is having increased SOB, cough and feeling lightheaded over the past 5 days. Patient reports increasing her diuretic without improvement. Patient concerned for possible pneumonia.  ROS: Resp: short ness of breath, cough  Neuro: light headed  Physical Exam:  BP 133/66 (BP Location: Right Arm)   Pulse 62   Temp 97.9 F (36.6 C) (Oral)   Resp 20   Ht 5\' 3"  (1.6 m)   Wt 56.7 kg   SpO2 95%   BMI 22.14 kg/m    Gen: No distress  Neuro: Awake and Alert  Skin: Warm and dry  Heart regular rate and rhythm  Lungs: rales RLL   Initiation of care has begun. The patient has been counseled on the process, plan, and necessity for staying for the completion/evaluation, and the remainder of the medical screening examination     Ashley Murrain, NP 02/08/18 1511    Dorie Rank, MD 02/08/18 1615

## 2018-02-09 ENCOUNTER — Observation Stay (HOSPITAL_BASED_OUTPATIENT_CLINIC_OR_DEPARTMENT_OTHER): Payer: Medicare Other

## 2018-02-09 ENCOUNTER — Encounter (HOSPITAL_COMMUNITY): Payer: Self-pay | Admitting: *Deleted

## 2018-02-09 ENCOUNTER — Other Ambulatory Visit: Payer: Self-pay

## 2018-02-09 DIAGNOSIS — R0602 Shortness of breath: Secondary | ICD-10-CM | POA: Diagnosis not present

## 2018-02-09 DIAGNOSIS — I5033 Acute on chronic diastolic (congestive) heart failure: Secondary | ICD-10-CM

## 2018-02-09 DIAGNOSIS — I10 Essential (primary) hypertension: Secondary | ICD-10-CM | POA: Diagnosis not present

## 2018-02-09 DIAGNOSIS — N179 Acute kidney failure, unspecified: Secondary | ICD-10-CM | POA: Diagnosis not present

## 2018-02-09 DIAGNOSIS — E7800 Pure hypercholesterolemia, unspecified: Secondary | ICD-10-CM

## 2018-02-09 DIAGNOSIS — N183 Chronic kidney disease, stage 3 (moderate): Secondary | ICD-10-CM | POA: Diagnosis not present

## 2018-02-09 DIAGNOSIS — K219 Gastro-esophageal reflux disease without esophagitis: Secondary | ICD-10-CM | POA: Diagnosis not present

## 2018-02-09 DIAGNOSIS — I5030 Unspecified diastolic (congestive) heart failure: Secondary | ICD-10-CM | POA: Diagnosis not present

## 2018-02-09 DIAGNOSIS — I480 Paroxysmal atrial fibrillation: Secondary | ICD-10-CM | POA: Diagnosis not present

## 2018-02-09 DIAGNOSIS — E039 Hypothyroidism, unspecified: Secondary | ICD-10-CM | POA: Diagnosis not present

## 2018-02-09 LAB — CBC
HEMATOCRIT: 34.1 % — AB (ref 36.0–46.0)
HEMOGLOBIN: 11 g/dL — AB (ref 12.0–15.0)
MCH: 30.8 pg (ref 26.0–34.0)
MCHC: 32.3 g/dL (ref 30.0–36.0)
MCV: 95.5 fL (ref 80.0–100.0)
Platelets: 157 10*3/uL (ref 150–400)
RBC: 3.57 MIL/uL — ABNORMAL LOW (ref 3.87–5.11)
RDW: 14.7 % (ref 11.5–15.5)
WBC: 5.9 10*3/uL (ref 4.0–10.5)
nRBC: 0 % (ref 0.0–0.2)

## 2018-02-09 LAB — BASIC METABOLIC PANEL
Anion gap: 11 (ref 5–15)
BUN: 45 mg/dL — ABNORMAL HIGH (ref 8–23)
CHLORIDE: 106 mmol/L (ref 98–111)
CO2: 25 mmol/L (ref 22–32)
CREATININE: 1.09 mg/dL — AB (ref 0.44–1.00)
Calcium: 8.6 mg/dL — ABNORMAL LOW (ref 8.9–10.3)
GFR calc Af Amer: 51 mL/min — ABNORMAL LOW (ref >60)
GFR calc non Af Amer: 44 mL/min — ABNORMAL LOW (ref >60)
GLUCOSE: 130 mg/dL — AB (ref 70–99)
POTASSIUM: 3.6 mmol/L (ref 3.5–5.1)
SODIUM: 142 mmol/L (ref 135–145)

## 2018-02-09 LAB — I-STAT TROPONIN, ED: Troponin i, poc: 0.02 ng/mL (ref 0.00–0.08)

## 2018-02-09 LAB — ECHOCARDIOGRAM COMPLETE
Height: 63 in
WEIGHTICAEL: 2134.05 [oz_av]

## 2018-02-09 MED ORDER — MIRABEGRON ER 25 MG PO TB24
25.0000 mg | ORAL_TABLET | Freq: Every evening | ORAL | Status: DC
  Administered 2018-02-09: 25 mg via ORAL
  Filled 2018-02-09 (×2): qty 1

## 2018-02-09 MED ORDER — HYDRALAZINE HCL 20 MG/ML IJ SOLN: 5.0000 mg | INTRAMUSCULAR | Status: DC | PRN

## 2018-02-09 MED ORDER — SODIUM CHLORIDE 0.9% FLUSH: 3.0000 mL | INTRAVENOUS | Status: DC | PRN

## 2018-02-09 MED ORDER — HYDROCORTISONE 1 % EX CREA: 1.0000 "application " | TOPICAL_CREAM | CUTANEOUS | Status: DC | PRN

## 2018-02-09 MED ORDER — ISOSORBIDE MONONITRATE ER 30 MG PO TB24
15.0000 mg | ORAL_TABLET | Freq: Every day | ORAL | Status: DC
  Administered 2018-02-09 – 2018-02-10 (×2): 15 mg via ORAL
  Filled 2018-02-09 (×2): qty 1

## 2018-02-09 MED ORDER — FUROSEMIDE 10 MG/ML IJ SOLN
40.0000 mg | Freq: Two times a day (BID) | INTRAMUSCULAR | Status: DC
  Administered 2018-02-09: 40 mg via INTRAVENOUS
  Filled 2018-02-09: qty 4

## 2018-02-09 MED ORDER — SODIUM CHLORIDE 0.9% FLUSH
3.0000 mL | Freq: Two times a day (BID) | INTRAVENOUS | Status: DC
  Administered 2018-02-09 (×3): 3 mL via INTRAVENOUS

## 2018-02-09 MED ORDER — LEVOTHYROXINE SODIUM 100 MCG PO TABS
100.0000 ug | ORAL_TABLET | Freq: Every day | ORAL | Status: DC
  Administered 2018-02-09 – 2018-02-10 (×2): 100 ug via ORAL
  Filled 2018-02-09 (×2): qty 1

## 2018-02-09 MED ORDER — SODIUM CHLORIDE 0.9 % IV SOLN: 250.0000 mL | INTRAVENOUS | Status: DC | PRN

## 2018-02-09 MED ORDER — ASPIRIN EC 81 MG PO TBEC
81.0000 mg | DELAYED_RELEASE_TABLET | Freq: Every day | ORAL | Status: DC
  Administered 2018-02-09 (×2): 81 mg via ORAL
  Filled 2018-02-09 (×2): qty 1

## 2018-02-09 MED ORDER — ALBUTEROL SULFATE (2.5 MG/3ML) 0.083% IN NEBU: 2.5000 mg | INHALATION_SOLUTION | RESPIRATORY_TRACT | Status: DC | PRN

## 2018-02-09 MED ORDER — ONDANSETRON HCL 4 MG/2ML IJ SOLN: 4.0000 mg | Freq: Four times a day (QID) | INTRAMUSCULAR | Status: DC | PRN

## 2018-02-09 MED ORDER — FUROSEMIDE 10 MG/ML IJ SOLN
20.0000 mg | Freq: Two times a day (BID) | INTRAMUSCULAR | Status: DC
  Administered 2018-02-09 – 2018-02-10 (×2): 20 mg via INTRAVENOUS
  Filled 2018-02-09 (×2): qty 2

## 2018-02-09 MED ORDER — FLUTICASONE PROPIONATE 50 MCG/ACT NA SUSP: 1.0000 | Freq: Every day | NASAL | Status: DC | PRN

## 2018-02-09 MED ORDER — LORAZEPAM 0.5 MG PO TABS: 0.5000 mg | ORAL_TABLET | ORAL | Status: DC | PRN

## 2018-02-09 MED ORDER — FUROSEMIDE 10 MG/ML IJ SOLN
20.0000 mg | Freq: Once | INTRAMUSCULAR | Status: AC
  Administered 2018-02-09: 20 mg via INTRAVENOUS
  Filled 2018-02-09: qty 2

## 2018-02-09 MED ORDER — PANTOPRAZOLE SODIUM 40 MG PO TBEC
40.0000 mg | DELAYED_RELEASE_TABLET | Freq: Every day | ORAL | Status: DC
  Administered 2018-02-09 – 2018-02-10 (×2): 40 mg via ORAL
  Filled 2018-02-09 (×2): qty 1

## 2018-02-09 MED ORDER — ENOXAPARIN SODIUM 30 MG/0.3ML ~~LOC~~ SOLN
30.0000 mg | SUBCUTANEOUS | Status: DC
  Administered 2018-02-09: 30 mg via SUBCUTANEOUS
  Filled 2018-02-09: qty 0.3

## 2018-02-09 MED ORDER — CYCLOSPORINE 0.05 % OP EMUL
1.0000 [drp] | Freq: Two times a day (BID) | OPHTHALMIC | Status: DC | PRN
  Filled 2018-02-09: qty 1

## 2018-02-09 MED ORDER — ZOLPIDEM TARTRATE 5 MG PO TABS: 5.0000 mg | ORAL_TABLET | Freq: Every evening | ORAL | Status: DC | PRN

## 2018-02-09 MED ORDER — CALCIUM CARBONATE-VITAMIN D 500-200 MG-UNIT PO TABS
1.0000 | ORAL_TABLET | Freq: Every day | ORAL | Status: DC
  Administered 2018-02-09 – 2018-02-10 (×2): 1 via ORAL
  Filled 2018-02-09 (×2): qty 1

## 2018-02-09 MED ORDER — PROSIGHT PO TABS
1.0000 | ORAL_TABLET | Freq: Every day | ORAL | Status: DC
  Administered 2018-02-09 – 2018-02-10 (×2): 1 via ORAL
  Filled 2018-02-09 (×2): qty 1

## 2018-02-09 MED ORDER — DILTIAZEM HCL ER COATED BEADS 360 MG PO CP24
360.0000 mg | ORAL_CAPSULE | Freq: Every day | ORAL | Status: DC
  Administered 2018-02-09 – 2018-02-10 (×2): 360 mg via ORAL
  Filled 2018-02-09: qty 2
  Filled 2018-02-09 (×2): qty 1
  Filled 2018-02-09: qty 2

## 2018-02-09 MED ORDER — MONTELUKAST SODIUM 10 MG PO TABS
10.0000 mg | ORAL_TABLET | Freq: Every day | ORAL | Status: DC
  Administered 2018-02-09 – 2018-02-10 (×2): 10 mg via ORAL
  Filled 2018-02-09 (×2): qty 1

## 2018-02-09 MED ORDER — PREGABALIN 100 MG PO CAPS
300.0000 mg | ORAL_CAPSULE | Freq: Two times a day (BID) | ORAL | Status: DC
  Administered 2018-02-09 – 2018-02-10 (×4): 300 mg via ORAL
  Filled 2018-02-09 (×4): qty 3

## 2018-02-09 MED ORDER — TRAMADOL HCL 50 MG PO TABS: 50.0000 mg | ORAL_TABLET | Freq: Four times a day (QID) | ORAL | Status: DC | PRN

## 2018-02-09 MED ORDER — ACETAMINOPHEN 325 MG PO TABS: 650.0000 mg | ORAL_TABLET | ORAL | Status: DC | PRN

## 2018-02-09 MED ORDER — ESTRADIOL 1 MG PO TABS
0.5000 mg | ORAL_TABLET | Freq: Every day | ORAL | Status: DC
  Administered 2018-02-09: 0.5 mg via ORAL
  Filled 2018-02-09: qty 0.5

## 2018-02-09 MED ORDER — ROSUVASTATIN CALCIUM 20 MG PO TABS
20.0000 mg | ORAL_TABLET | Freq: Every day | ORAL | Status: DC
  Administered 2018-02-09 – 2018-02-10 (×2): 20 mg via ORAL
  Filled 2018-02-09 (×2): qty 1

## 2018-02-09 MED ORDER — METOPROLOL TARTRATE 100 MG PO TABS
100.0000 mg | ORAL_TABLET | Freq: Two times a day (BID) | ORAL | Status: DC
  Administered 2018-02-09 – 2018-02-10 (×3): 100 mg via ORAL
  Filled 2018-02-09 (×4): qty 1

## 2018-02-09 MED ORDER — NITROGLYCERIN 0.4 MG SL SUBL: 0.4000 mg | SUBLINGUAL_TABLET | SUBLINGUAL | Status: DC | PRN

## 2018-02-09 MED ORDER — LIDOCAINE-PRILOCAINE 2.5-2.5 % EX CREA: 1.0000 "application " | TOPICAL_CREAM | CUTANEOUS | Status: DC | PRN

## 2018-02-09 MED ORDER — BIOTIN 5 MG PO CAPS: ORAL_CAPSULE | Freq: Every day | ORAL | Status: DC

## 2018-02-09 NOTE — Progress Notes (Signed)
Patient admitted after midnight, please see H&P.  Here getting IV diuresis for volume overload.  Continue to follow labs and improvement closely.  Cardiology also following.  Lives home alone and has right AKA. Eulogio Bear DO
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
## 2018-02-09 NOTE — Progress Notes (Signed)
Patient admitted earlier today with congestive heart failure symptoms.  Her dyspnea is mildly improved this morning.  Renal function worse compared to previous.  Decrease Lasix to 20 mg IV twice daily.  Follow renal function closely.  Kirk Ruths

## 2018-02-09 NOTE — Care Management Note (Signed)
Case Management Note  Patient Details  Name: Kelsey Joseph MRN: [id number] Date of Birth: May 09, 1927  Subjective/Objective:     CHF              Action/Plan: Patient lives at home alone; PCP: [name], MD; has private insurance with Medicare; awaiting for Physical Therapy eval for disposition needs. CM will continue to follow for progression of care.  Expected Discharge Date:      Possibly 02/13/2018            Expected Discharge Plan:  [place]  In-House Referral:   [facility]  Discharge planning Services  CM Consult  Status of Service:  In process, will continue to follow  Sherrilyn Rist [phone] 02/09/2018, 11:19 AM

## 2018-02-09 NOTE — ED Notes (Signed)
ED Provider at bedside. 

## 2018-02-09 NOTE — Progress Notes (Signed)
PT Cancellation Note  Patient Details Name: Kelsey Joseph MRN: [id number] DOB: [birth year]   Cancelled Treatment:    Reason Eval/Treat Not Completed: Fatigue/lethargy limiting ability to participate since admitted at 4AM this morning. Will follow-up for PT evaluation as schedule permits.  Mabeline Caras, PT, DPT Acute Rehabilitation Services  Pager [phone] Office [place] 02/09/2018, 12:26 PM

## 2018-02-09 NOTE — ED Notes (Signed)
Cardiology at bedside.
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
## 2018-02-10 DIAGNOSIS — I480 Paroxysmal atrial fibrillation: Secondary | ICD-10-CM

## 2018-02-10 DIAGNOSIS — E039 Hypothyroidism, unspecified: Secondary | ICD-10-CM | POA: Diagnosis not present

## 2018-02-10 DIAGNOSIS — I5033 Acute on chronic diastolic (congestive) heart failure: Secondary | ICD-10-CM | POA: Diagnosis not present

## 2018-02-10 DIAGNOSIS — R0602 Shortness of breath: Secondary | ICD-10-CM | POA: Diagnosis not present

## 2018-02-10 LAB — BASIC METABOLIC PANEL
Anion gap: 10 (ref 5–15)
BUN: 41 mg/dL — ABNORMAL HIGH (ref 8–23)
CO2: 26 mmol/L (ref 22–32)
CREATININE: 1.05 mg/dL — AB (ref 0.44–1.00)
Calcium: 9.1 mg/dL (ref 8.9–10.3)
Chloride: 106 mmol/L (ref 98–111)
GFR calc non Af Amer: 46 mL/min — ABNORMAL LOW (ref >60)
GFR, EST AFRICAN AMERICAN: 53 mL/min — AB (ref >60)
Glucose, Bld: 122 mg/dL — ABNORMAL HIGH (ref 70–99)
Potassium: 4.1 mmol/L (ref 3.5–5.1)
SODIUM: 142 mmol/L (ref 135–145)

## 2018-02-10 LAB — GLUCOSE, CAPILLARY: Glucose-Capillary: 163 mg/dL — ABNORMAL HIGH (ref 70–99)

## 2018-02-10 LAB — CBC
HCT: 33.5 % — ABNORMAL LOW (ref 36.0–46.0)
Hemoglobin: 10.3 g/dL — ABNORMAL LOW (ref 12.0–15.0)
MCH: 29.8 pg (ref 26.0–34.0)
MCHC: 30.7 g/dL (ref 30.0–36.0)
MCV: 96.8 fL (ref 80.0–100.0)
NRBC: 0 % (ref 0.0–0.2)
PLATELETS: 165 10*3/uL (ref 150–400)
RBC: 3.46 MIL/uL — ABNORMAL LOW (ref 3.87–5.11)
RDW: 14.9 % (ref 11.5–15.5)
WBC: 6.2 10*3/uL (ref 4.0–10.5)

## 2018-02-10 MED ORDER — HYDROCORTISONE 1 % EX LOTN: 1.0000 "application " | TOPICAL_LOTION | CUTANEOUS | Status: DC | PRN

## 2018-02-10 MED ORDER — TORSEMIDE 20 MG PO TABS: 30.0000 mg | ORAL_TABLET | Freq: Every day | ORAL | Status: DC

## 2018-02-10 NOTE — Progress Notes (Signed)
Progress Note  Patient Name: Kelsey Joseph Date of Encounter: 02/10/2018  Primary Cardiologist: Dr Sallyanne Kuster  Subjective   No CP; dyspnea improved  Inpatient Medications    Scheduled Meds: . aspirin EC  81 mg Oral QHS  . calcium-vitamin D  1 tablet Oral Daily  . diltiazem  360 mg Oral Daily  . enoxaparin (LOVENOX) injection  30 mg Subcutaneous Q24H  . estradiol  0.5 mg Oral QHS  . furosemide  20 mg Intravenous Q12H  . isosorbide mononitrate  15 mg Oral Daily  . levothyroxine  100 mcg Oral QAC breakfast  . metoprolol tartrate  100 mg Oral BID  . mirabegron ER  25 mg Oral QPM  . montelukast  10 mg Oral Daily  . multivitamin  1 tablet Oral Daily  . pantoprazole  40 mg Oral Daily  . pregabalin  300 mg Oral BID  . rosuvastatin  20 mg Oral Daily  . sodium chloride flush  3 mL Intravenous Q12H   Continuous Infusions: . sodium chloride     PRN Meds: sodium chloride, acetaminophen, albuterol, cycloSPORINE, fluticasone, hydrALAZINE, hydrocortisone cream, lidocaine-prilocaine, LORazepam, nitroGLYCERIN, ondansetron (ZOFRAN) IV, sodium chloride flush, traMADol, zolpidem   Vital Signs    Vitals:   02/09/18 1709 02/09/18 1928 02/10/18 0054 02/10/18 0514  BP: (!) 103/49 (!) 106/50 (!) 121/48 (!) 122/56  Pulse: 61 62 (!) 59 62  Resp: 18 18 18 18   Temp: 97.9 F (36.6 C) 97.6 F (36.4 C) (!) 97.5 F (36.4 C) (!) 97.4 F (36.3 C)  TempSrc: Oral Oral Oral Oral  SpO2: 93% 94% 97% 95%  Weight:    60.5 kg  Height:        Intake/Output Summary (Last 24 hours) at 02/10/2018 0756 Last data filed at 02/10/2018 0522 Gross per 24 hour  Intake 840 ml  Output 750 ml  Net 90 ml   Filed Weights   02/09/18 0316 02/09/18 0316 02/10/18 0514  Weight: 60.1 kg 60.5 kg 60.5 kg    Telemetry    Atrial paced - Personally Reviewed   Physical Exam   GEN: No acute distress.   Neck: No JVD Cardiac: RRR, no murmurs, rubs, or gallops.  Respiratory: Clear to auscultation bilaterally. GI:  Soft, nontender, non-distended  MS: No edema; s/p R AKA; surgical scars LLE Neuro:  Nonfocal  Psych: Normal affect   Labs    Chemistry Recent Labs  Lab 02/08/18 1516 02/09/18 0828 02/10/18 0449  NA 141 142 142  K 3.7 3.6 4.1  CL 104 106 106  CO2 29 25 26   GLUCOSE 120* 130* 122*  BUN 53* 45* 41*  CREATININE 1.27* 1.09* 1.05*  CALCIUM 9.3 8.6* 9.1  GFRNONAA 37* 44* 46*  GFRAA 42* 51* 53*  ANIONGAP 8 11 10      Hematology Recent Labs  Lab 02/08/18 1516 02/09/18 0828 02/10/18 0449  WBC 7.6 5.9 6.2  RBC 4.05 3.57* 3.46*  HGB 12.3 11.0* 10.3*  HCT 39.1 34.1* 33.5*  MCV 96.5 95.5 96.8  MCH 30.4 30.8 29.8  MCHC 31.5 32.3 30.7  RDW 14.7 14.7 14.9  PLT 179 157 165     Recent Labs  Lab 02/08/18 1547 02/09/18 0111  TROPIPOC 0.01 0.02     BNP Recent Labs  Lab 02/08/18 1516  BNP 456.9*     Radiology    Dg Chest 2 View  Result Date: 02/08/2018 CLINICAL DATA:  Shortness of breath, chest pain, symptoms began 3 days ago. History of CHF and  previous MI as well as previous episodes of pneumonia. EXAM: CHEST - 2 VIEW COMPARISON:  PA and lateral chest x-ray of Aug 31, 2017 FINDINGS: The lungs are well-expanded. The interstitial markings are increased bilaterally. No alveolar pneumonia is observed. The heart is top-normal in size. The pulmonary vascularity is not clearly engorged. The ICD is in stable position. There is calcification in the wall of the aortic arch. Small amounts of pleural fluid are suspected in the posterior costophrenic gutters. IMPRESSION: Findings may reflect low-grade CHF. One cannot exclude superimposed interstitial pneumonia or acute bronchitis. Thoracic aortic atherosclerosis. Electronically Signed   By: David  Martinique M.D.   On: 02/08/2018 15:56    Patient Profile     84 year old female with past medical history of paroxysmal atrial fibrillation not on anticoagulation because of recurrent bleeding, prior pacemaker, hypertension, chronic diastolic  congestive heart failure, presumed coronary artery disease with acute on chronic diastolic congestive heart failure.  Echocardiogram shows normal LV function, moderate mitral regurgitation, mild to moderate left atrial enlargement, mild right ventricular enlargement and moderate RV dysfunction.  Assessment & Plan    1 acute on chronic diastolic congestive heart failure-I/O-+90. Wt 60.5 kg.  Patient's symptoms are much improved.  She denies dyspnea this morning.  Would discharge on preadmission dose of diuretic (Demadex 30 mg daily).  I instructed her on fluid restriction to 1 L daily and low-sodium diet.  She will take an additional 10 mg of Demadex daily for increasing dyspnea.  2 acute on chronic stage III kidney disease-renal function mildly improved today.  We will continue to follow as an outpatient.  3 hypertension-blood pressure controlled.  Continue present medications.  4 atrial fibrillation-patient is in atrial paced rhythm today.  Continue metoprolol and Cardizem for rate control if atrial fibrillation recurs.  She is not on anticoagulation because of history of GI bleeding.  5 Presumed CAD-continue ASA and statin.  6 s/p PPM  Patient can be discharged from a cardiac standpoint.  We will arrange transition of care appointment in 1 week with APP.  Check potassium and renal function at that time.  Follow-up Dr. Sallyanne Kuster in 6 to 8 weeks.  [facility] will sign off.   Medication Recommendations: Continue preadmission medications; diuretics as outlined above Other recommendations (labs, testing, etc):  Bmet one week Follow up as an outpatient:  As outlined above.  For questions or updates, please contact [place] Please consult [email or web address] for contact info under        Signed, Kirk Ruths, MD  02/10/2018, 7:56 AM

## 2018-02-10 NOTE — Evaluation (Signed)
Physical Therapy Evaluation & Discharge Patient Details Name: Kelsey Joseph MRN: [id number] DOB: [birth year] Today's Date: 02/10/2018   History of Present Illness  Pt is an 84 y.o. female admitted 02/08/18 with SOB and worsening LE edema; worked up for CHF exacerbation. PMH includes CHF, HTN, HLD, CAD, CKD3, a-fib, SSS s/p pacemaker, OSA, R hip disarticulation (1989).    Clinical Impression  Patient evaluated by Physical Therapy with no further acute PT needs identified. PTA, pt mod indep with electric scooter, drives, and remains active; lives alone with dog. Today, pt mod indep with squat pivot transfers and ADLs. Discussed pt's interested in starting to use prosthetic again (has been a few years) and recommendation for OP PT for training with this vs pt attempting on her own. All education has been completed and the patient has no further questions. Acute PT is signing off. Thank you for this referral.    Follow Up Recommendations No PT follow up;Supervision - Intermittent    Equipment Recommendations  None recommended by PT    Recommendations for Other Services       Precautions / Restrictions Precautions Precautions: Fall Restrictions Weight Bearing Restrictions: No      Mobility  Bed Mobility Overal bed mobility: Modified Independent                Transfers Overall transfer level: Modified independent Equipment used: None             General transfer comment: Mod indep with squat pivot transfer from bed<>BSC on LLE  Ambulation/Gait             General Gait Details: Pt does not ambulate at baseline  Stairs            Wheelchair Mobility    Modified Rankin (Stroke Patients Only)       Balance Overall balance assessment: Needs assistance   Sitting balance-Leahy Scale: Good                                       Pertinent Vitals/Pain Pain Assessment: No/denies pain    Home Living Family/patient expects to be discharged  to:: Private residence Living Arrangements: Alone(with dog) Available Help at Discharge: Family;Friend(s);Available PRN/intermittently Type of Home: House Home Access: Level entry     Home Layout: One level Home Equipment: Transport planner;Shower seat;Shower seat - built in;Wheelchair - manual;Crutches Additional Comments: Will be getting new electric w/c in the next month that is able to elevate    Prior Function Level of Independence: Independent with assistive device(s)         Comments: Uses electric scooter. Sometimes stands at sink with crutches (does not like using RW). Indep with squat pivot transfers. Drives (ramp into middle seat, then transfers to driver seat). Active with amputee support groups     Hand Dominance        Extremity/Trunk Assessment   Upper Extremity Assessment Upper Extremity Assessment: Overall WFL for tasks assessed    Lower Extremity Assessment Lower Extremity Assessment: RLE deficits/detail;LLE deficits/detail RLE Deficits / Details: s/p R hip disarticulation (1989) LLE Deficits / Details: Grossly 4/5 throughout; h/o PAD with decreased light touch sensation, redness       Communication   Communication: No difficulties  Cognition Arousal/Alertness: Awake/alert Behavior During Therapy: WFL for tasks assessed/performed Overall Cognitive Status: Within Functional Limits for tasks assessed  General Comments General comments (skin integrity, edema, etc.): Pt reports interest in using RLE prosthetic again (has not used in a few years); advised not to try this by herself. Discussed potential for OP PT to initiate this process again (pt has prior experience with [facility] OP PT)    Exercises     Assessment/Plan    PT Assessment Patent does not need any further PT services  PT Problem List         PT Treatment Interventions      PT Goals (Current goals can be found in the Care Plan  section)  Acute Rehab PT Goals PT Goal Formulation: All assessment and education complete, DC therapy    Frequency     Barriers to discharge        Co-evaluation               AM-PAC PT "6 Clicks" Daily Activity  Outcome Measure Difficulty turning over in bed (including adjusting bedclothes, sheets and blankets)?: None Difficulty moving from lying on back to sitting on the side of the bed? : None Difficulty sitting down on and standing up from a chair with arms (e.g., wheelchair, bedside commode, etc,.)?: None Help needed moving to and from a bed to chair (including a wheelchair)?: None Help needed walking in hospital room?: Total Help needed climbing 3-5 steps with a railing? : Total 6 Click Score: 18    End of Session   Activity Tolerance: Patient tolerated treatment well Patient left: with call bell/phone within reach(seated EOB) Nurse Communication: Mobility status PT Visit Diagnosis: Other abnormalities of gait and mobility (R26.89)    Time: 1610-9604 PT Time Calculation (min) (ACUTE ONLY): 20 min   Charges:   PT Evaluation $PT Eval Moderate Complexity: [place], PT, DPT Acute Rehabilitation Services  Pager [phone] Office [place] 02/10/2018, 9:35 AM

## 2018-02-10 NOTE — Progress Notes (Signed)
Pt vitals remain stable. Pt states she still feels lightheaded however pt informed RN she had this feeling before she came in. Dr. Eliseo Squires made aware- ok to discharge and follow up with PCP.

## 2018-02-10 NOTE — Progress Notes (Signed)
1042 pt c/o lightheadedness. BP 120/60. Pt states "I feel kind of out of it" MD paged and made aware. Ordered to check sugar and continue to monitor.

## 2018-02-10 NOTE — Progress Notes (Signed)
Pt given discharge instructions with understanding no questions at this time. Pt awaiting daughter to take home.

## 2018-02-11 NOTE — Discharge Summary (Signed)
Physician Discharge Summary  Kayann E Botsford LMB:[id number] DOB: 1927-10-16 DOA: 02/08/2018  PCP: [name], MD  Admit date: 02/08/2018 Discharge date: 02/11/2018  Admitted From: home Discharge disposition: home   Recommendations for Outpatient Follow-Up:   Bmp 1 week   Discharge Diagnosis:   Principal Problem:   Acute on chronic diastolic CHF (congestive heart failure) ([place]) Active Problems:   Hypothyroidism   Essential hypertension   Atrial fibrillation (HCC)   GERD   Hypercholesterolemia   Coronary artery disease   Acute renal failure superimposed on stage 3 chronic kidney disease ([place])    Discharge Condition: Improved.  Diet recommendation: Low sodium, heart healthy  Wound care: None.  Code status: Full.   History of Present Illness:  Kelsey Joseph is a 84 y.o. female with medical history significant of dCHF, hypertension, hyperlipidemia, GERD, hypothyroidism, CAD, CKD-3, atrial fibrillation not anticoagulated due to GI bleeding, SSS, s/p of pacemaker placement, OSA not on CPAP (wearing snore guard at night), GERD, hypothyroidism, anxiety, s/p of R AKA, who presents with shortness of breath and worsening leg edema  Patient states that she has been having shortness of breath in the past 5 days, which has been progressively getting worse.  She has dry cough, but no fever, chills, chest pain.  She has lightheadedness, but no unilateral weakness, numbness or tingling in extremities.  Patient does not have nausea vomiting, diarrhea, abdominal pain, symptoms of UTI.  She states that she has worsening left leg edema. She was told to increased her dose of torsemide without improvement.   Hospital Course by Problem:   acute on chronic diastolic congestive heart failure-I/O-+90. Wt 60.5 kg.  Patient's symptoms are much improved.  She denies dyspnea this morning.   -fluid restriction to 1 L daily and low-sodium diet.   -10 mg of Demadex daily for increasing  dyspnea on top of home dose  acute on chronic stage III kidney disease -bmp 1 week  hypertension-blood pressure controlled.  Continue present medications.  atrial fibrillation-patient is in atrial paced rhythm today.  Continue metoprolol and Cardizem  -She is not on anticoagulation because of history of GI bleeding.  Presumed CAD-continue ASA and statin.  Hypothyroidism: Last TSH was 1.523 on 10/17/2016 -Continue home Synthroid     Medical Consultants:   cards   Discharge Exam:   Vitals:   02/10/18 1042 02/10/18 1157  BP: 126/60 (!) 120/58  Pulse: 61 64  Resp:    Temp:  (!) 97.4 F (36.3 C)  SpO2: 94% 97%   Vitals:   02/10/18 0514 02/10/18 0814 02/10/18 1042 02/10/18 1157  BP: (!) 122/56 (!) 123/58 126/60 (!) 120/58  Pulse: 62 61 61 64  Resp: 18 20    Temp: (!) 97.4 F (36.3 C) (!) 97.5 F (36.4 C)  (!) 97.4 F (36.3 C)  TempSrc: Oral Oral  Oral  SpO2: 95% 94% 94% 97%  Weight: 60.5 kg     Height:        General exam: Appears calm and comfortable.   The results of significant diagnostics from this hospitalization (including imaging, microbiology, ancillary and laboratory) are listed below for reference.     Procedures and Diagnostic Studies:   Dg Chest 2 View  Result Date: 02/08/2018 CLINICAL DATA:  Shortness of breath, chest pain, symptoms began 3 days ago. History of CHF and previous MI as well as previous episodes of pneumonia. EXAM: CHEST - 2 VIEW COMPARISON:  PA and lateral chest x-ray of  Aug 31, 2017 FINDINGS: The lungs are well-expanded. The interstitial markings are increased bilaterally. No alveolar pneumonia is observed. The heart is top-normal in size. The pulmonary vascularity is not clearly engorged. The ICD is in stable position. There is calcification in the wall of the aortic arch. Small amounts of pleural fluid are suspected in the posterior costophrenic gutters. IMPRESSION: Findings may reflect low-grade CHF. One cannot exclude  superimposed interstitial pneumonia or acute bronchitis. Thoracic aortic atherosclerosis. Electronically Signed   By: David  Martinique M.D.   On: 02/08/2018 15:56     Labs:   Basic Metabolic Panel: Recent Labs  Lab 02/08/18 1516 02/09/18 0828 02/10/18 0449  NA 141 142 142  K 3.7 3.6 4.1  CL 104 106 106  CO2 29 25 26   GLUCOSE 120* 130* 122*  BUN 53* 45* 41*  CREATININE 1.27* 1.09* 1.05*  CALCIUM 9.3 8.6* 9.1   GFR Estimated Creatinine Clearance: 30.6 mL/min (A) (by C-G formula based on SCr of 1.05 mg/dL (H)). Liver Function Tests: No results for input(s): AST, ALT, ALKPHOS, BILITOT, PROT, ALBUMIN in the last 168 hours. No results for input(s): LIPASE, AMYLASE in the last 168 hours. No results for input(s): AMMONIA in the last 168 hours. Coagulation profile No results for input(s): INR, PROTIME in the last 168 hours.  CBC: Recent Labs  Lab 02/08/18 1516 02/09/18 0828 02/10/18 0449  WBC 7.6 5.9 6.2  HGB 12.3 11.0* 10.3*  HCT 39.1 34.1* 33.5*  MCV 96.5 95.5 96.8  PLT 179 157 165   Cardiac Enzymes: No results for input(s): CKTOTAL, CKMB, CKMBINDEX, TROPONINI in the last 168 hours. BNP: Invalid input(s): POCBNP CBG: Recent Labs  Lab 02/10/18 1059  GLUCAP 163*   D-Dimer No results for input(s): DDIMER in the last 72 hours. Hgb A1c No results for input(s): HGBA1C in the last 72 hours. Lipid Profile No results for input(s): CHOL, HDL, LDLCALC, TRIG, CHOLHDL, LDLDIRECT in the last 72 hours. Thyroid function studies No results for input(s): TSH, T4TOTAL, T3FREE, THYROIDAB in the last 72 hours.  Invalid input(s): FREET3 Anemia work up No results for input(s): VITAMINB12, FOLATE, FERRITIN, TIBC, IRON, RETICCTPCT in the last 72 hours. Microbiology No results found for this or any previous visit (from the past 240 hour(s)).   Discharge Instructions:   Discharge Instructions    Diet - low sodium heart healthy   Complete by:  As directed    Diet Carb Modified    Complete by:  As directed    Increase activity slowly   Complete by:  As directed      Allergies as of 02/10/2018      Reactions   Latex Other (See Comments)   Patient states only "latex bandages" causes blisters   Sulfa Antibiotics Diarrhea   Severe diarrhea   Adhesive [tape] Other (See Comments)   Blisters, when left on for "a while."      Medication List    TAKE these medications   aspirin EC 81 MG tablet Take 1 tablet (81 mg total) by mouth daily. What changed:  when to take this   BIOTIN PO Take 1 tablet by mouth daily.   CALCIUM + D PO Take 1 tablet by mouth daily.   cycloSPORINE 0.05 % ophthalmic emulsion Commonly known as:  RESTASIS Place 1 drop into both eyes as needed (For dry eyes.).   diclofenac sodium 1 % Gel Commonly known as:  VOLTAREN Apply 4 g topically 4 (four) times daily as needed.   diltiazem 360  MG 24 hr capsule Commonly known as:  TIAZAC TAKE 1 CAPSULE DAILY   estradiol 0.5 MG tablet Commonly known as:  ESTRACE Take 0.5 mg by mouth at bedtime.   fluticasone 50 MCG/ACT nasal spray Commonly known as:  FLONASE Place 1 spray into both nostrils daily as needed for allergies.   hydrocortisone 1 % lotion Apply 1 application topically as needed. Apply on the itchy rashes on the back   isosorbide mononitrate 30 MG 24 hr tablet Commonly known as:  IMDUR TAKE 1/2 TABLET BY MOUTH DAILY What changed:    how much to take  how to take this  when to take this  additional instructions   lidocaine-prilocaine cream Commonly known as:  EMLA Apply 1 application topically as needed.   LORazepam 0.5 MG tablet Commonly known as:  ATIVAN Take 1 tablet (0.5 mg total) by mouth as needed for anxiety. What changed:  reasons to take this   meloxicam 15 MG tablet Commonly known as:  MOBIC Take 15 mg by mouth daily. For shoulder pain   metoprolol tartrate 100 MG tablet Commonly known as:  LOPRESSOR Take 1 tablet (100 mg total) by mouth 2 (two)  times daily.   montelukast 10 MG tablet Commonly known as:  SINGULAIR Take 10 mg by mouth daily.   MYRBETRIQ 25 MG Tb24 tablet Generic drug:  mirabegron ER Take 25 mg by mouth every evening.   nitroGLYCERIN 0.4 MG SL tablet Commonly known as:  NITROSTAT Place 1 tablet (0.4 mg total) under the tongue every 5 (five) minutes x 3 doses as needed for chest pain.   omeprazole 40 MG capsule Commonly known as:  PRILOSEC Take 40 mg by mouth daily.   potassium chloride 10 MEQ tablet Commonly known as:  K-DUR Take 1 tablet (29meq) on days you take extra Torsemide only What changed:    how much to take  how to take this  when to take this  reasons to take this  additional instructions   pregabalin 300 MG capsule Commonly known as:  LYRICA Take 300 mg by mouth 2 (two) times daily.   pregabalin 100 MG capsule Commonly known as:  LYRICA Take 1 capsule (100 mg total) by mouth 3 (three) times daily as needed.   PRESERVISION AREDS Tabs Take 1 tablet by mouth daily.   REFRESH OP Place 1 drop into both eyes daily as needed (For dry eyes or irritation.).   rosuvastatin 20 MG tablet Commonly known as:  CRESTOR Take 1 tablet (20 mg total) by mouth daily.   SYNTHROID 100 MCG tablet Generic drug:  levothyroxine Take 100 mcg by mouth daily.   torsemide 20 MG tablet Commonly known as:  DEMADEX Take 1.5 tablets (30 mg total) by mouth daily. For SOB/edema take an additional 10 mg What changed:  additional instructions   traMADol 50 MG tablet Commonly known as:  ULTRAM Take 50 mg by mouth every 6 (six) hours as needed for moderate pain.      Follow-up Information    [name], MD. Go on 02/17/2018.   Specialty:  Internal Medicine Why:  BMP @1 :30pm with Tomasa Hose information: [address] [place] [address] [phone]            Time coordinating discharge: 25 min  Signed:  Geradine Girt DO  [facility] 02/11/2018, 5:03 PM

## 2018-02-14 ENCOUNTER — Other Ambulatory Visit: Payer: Self-pay

## 2018-02-14 NOTE — Patient Outreach (Signed)
[facility]) Care Management  02/14/2018  Kelsey Joseph 02/25/1928 [id number]   EMMI-Heart Failure RED ON EMMI ALERT Day # 2 Date: 02/12/18 Red Alert Reason:  Weighed themselves today? No  Have a reliable bathroom scale? No    Outreach attempt:  Spoke with patient. She is able to verify HIPAA.  Discussed red alerts with patient.  She states that she has a scale and weighing daily.  She says maybe she misunderstood the system but she is doing fine.  Patient has follow up appointment with physician and voices no concerns.   Advised patient that she will continue to get automated phone calls and if there is a question with response a nurse will call. She verbalized understanding and is thankful for call.     Plan: RN CM will close case.    Jone Baseman, RN, MSN [facility] Care Management Care Management Coordinator Direct Line [phone] Toll Free: [phone]  Fax: [phone]

## 2018-02-21 ENCOUNTER — Ambulatory Visit: Payer: Medicare Other

## 2018-02-21 ENCOUNTER — Telehealth: Payer: Self-pay

## 2018-02-21 NOTE — Telephone Encounter (Signed)
LMOVM reminding pt to send remote transmission.   

## 2018-02-24 ENCOUNTER — Encounter: Payer: Self-pay | Admitting: Cardiology

## 2018-02-24 ENCOUNTER — Ambulatory Visit (INDEPENDENT_AMBULATORY_CARE_PROVIDER_SITE_OTHER): Payer: Medicare Other

## 2018-02-24 DIAGNOSIS — I495 Sick sinus syndrome: Secondary | ICD-10-CM

## 2018-02-24 DIAGNOSIS — I5032 Chronic diastolic (congestive) heart failure: Secondary | ICD-10-CM | POA: Diagnosis not present

## 2018-02-25 ENCOUNTER — Encounter: Payer: Self-pay | Admitting: Cardiology

## 2018-02-25 NOTE — Progress Notes (Signed)
Remote pacemaker transmission.   

## 2018-02-26 ENCOUNTER — Other Ambulatory Visit: Payer: Self-pay | Admitting: Cardiovascular Disease

## 2018-03-04 ENCOUNTER — Other Ambulatory Visit: Payer: Self-pay

## 2018-03-04 NOTE — Patient Outreach (Signed)
[facility]) Care Management  03/04/2018  Nyeisha E Leaman 1927-11-20 [id number]   EMMI- Heart Failure RED ON EMMI ALERT Day # 20 Date: 03/02/18 Red Alert Reason:  Any new problems? Yes  New/worsening problems? Yes  Other symptoms/problems? Yes    Outreach attempt: no answer.  HIPAA compliant voice message left.   Plan: RN CM will attempt again within 4 business days and send letter.    Jone Baseman, RN, MSN [facility] Care Management Care Management Coordinator Direct Line [phone] Toll Free: [phone]  Fax: [phone]

## 2018-03-07 ENCOUNTER — Other Ambulatory Visit: Payer: Self-pay

## 2018-03-07 NOTE — Patient Outreach (Signed)
[facility]) Care Management  03/07/2018  Liah E Stacey 26-Mar-1928 [id number]   EMMI- Heart Failure RED ON EMMI ALERT Day # 20 Date:03/02/18 Red Alert Reason: Any new problems? Yes  New/worsening problems? Yes  Other symptoms/problems? Yes    Outreach attempt: Spoke with patient.  She reports that she is doing well.  Discussed red alerts with patient.  She reports that on that day she was having some heaviness in her chest.  She states she took her nitroglycerin as ordered by physician and heaviness went away within 30 minutes and she has not had any since.  She states that if the pain lasted longer that 30 minutes she is to contact physician but it did not.  Patient states that her weight today is 124 lbs.  Patient states that is two pounds lighter than the day before.  Discussed with patient weight parameters and when to notify physician.  She verbalized understanding and denies any shortness of breath or swelling.  Patient declines any present needs but is appreciative of call.    Plan: RN CM will close case.    Jone Baseman, RN, MSN [place] [facility] Care Management Coordinator Direct Line [phone] Cell [phone] Toll Free: [phone]  Fax: [phone]

## 2018-03-08 ENCOUNTER — Ambulatory Visit: Payer: Self-pay

## 2018-04-01 ENCOUNTER — Ambulatory Visit (INDEPENDENT_AMBULATORY_CARE_PROVIDER_SITE_OTHER): Payer: Medicare Other | Admitting: Physician Assistant

## 2018-04-01 ENCOUNTER — Encounter: Payer: Self-pay | Admitting: Physician Assistant

## 2018-04-01 VITALS — BP 123/67 | HR 70

## 2018-04-01 DIAGNOSIS — B029 Zoster without complications: Secondary | ICD-10-CM | POA: Diagnosis not present

## 2018-04-01 DIAGNOSIS — Z89611 Acquired absence of right leg above knee: Secondary | ICD-10-CM | POA: Diagnosis not present

## 2018-04-01 DIAGNOSIS — Z950 Presence of cardiac pacemaker: Secondary | ICD-10-CM | POA: Diagnosis not present

## 2018-04-01 DIAGNOSIS — I5032 Chronic diastolic (congestive) heart failure: Secondary | ICD-10-CM | POA: Diagnosis not present

## 2018-04-01 DIAGNOSIS — I208 Other forms of angina pectoris: Secondary | ICD-10-CM

## 2018-04-01 DIAGNOSIS — I10 Essential (primary) hypertension: Secondary | ICD-10-CM | POA: Diagnosis not present

## 2018-04-01 DIAGNOSIS — I4892 Unspecified atrial flutter: Secondary | ICD-10-CM
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
## 2018-04-01 NOTE — Patient Instructions (Signed)
Medication Instructions:  The current medical regimen is effective;  continue present plan and medications.  If you need a refill on your cardiac medications before your next appointment, please call your pharmacy.   Lab work: BMP If you have labs (blood work) drawn today and your tests are completely normal, you will receive your results only by: Marland Kitchen MyChart Message (if you have MyChart) OR . A paper copy in the mail If you have any lab test that is abnormal or we need to change your treatment, we will call you to review the results.  Follow-Up: At [facility], you and your health needs are our priority.  As part of our continuing mission to provide you with exceptional heart care, we have created designated Provider Care Teams.  These Care Teams include your primary Cardiologist (physician) and Advanced Practice Providers (APPs -  Physician Assistants and Nurse Practitioners) who all work together to provide you with the care you need, when you need it. You will need a follow up appointment in 2-3 months.   You may see Dr.Croitoru or one of the following Advanced Practice Providers on your designated Care Team: Almyra Deforest, [place] . Fabian Sharp, PA-C

## 2018-04-02 LAB — BASIC METABOLIC PANEL
BUN / CREAT RATIO: 43 — AB (ref 12–28)
BUN: 41 mg/dL — AB (ref 8–27)
CHLORIDE: 96 mmol/L (ref 96–106)
CO2: 27 mmol/L (ref 20–29)
CREATININE: 0.95 mg/dL (ref 0.57–1.00)
Calcium: 10 mg/dL (ref 8.7–10.3)
GFR calc Af Amer: 62 mL/min/{1.73_m2} (ref >59)
GFR calc non Af Amer: 54 mL/min/{1.73_m2} — ABNORMAL LOW (ref >59)
GLUCOSE: 135 mg/dL — AB (ref 65–99)
Potassium: 5.4 mmol/L — ABNORMAL HIGH (ref 3.5–5.2)
Sodium: 139 mmol/L (ref 134–144)

## 2018-04-03 ENCOUNTER — Encounter: Payer: Self-pay | Admitting: Physician Assistant

## 2018-04-04 NOTE — Progress Notes (Signed)
Thanks, Hao MCr 

## 2018-04-05 NOTE — Progress Notes (Signed)
Potentially, her potassium is right over the upper border of normal

## 2018-04-07 DIAGNOSIS — E119 Type 2 diabetes mellitus without complications: Secondary | ICD-10-CM | POA: Diagnosis not present

## 2018-04-07 DIAGNOSIS — I10 Essential (primary) hypertension: Secondary | ICD-10-CM | POA: Diagnosis not present

## 2018-04-22 LAB — CUP PACEART REMOTE DEVICE CHECK
Brady Statistic AP VP Percent: 22.11 %
Brady Statistic AP VS Percent: 35.55 %
Brady Statistic AS VP Percent: 41.33 %
Brady Statistic AS VS Percent: 1.02 %
Brady Statistic RA Percent Paced: 56.06 %
Brady Statistic RV Percent Paced: 63.4 %
Implantable Lead Implant Date: 20151215
Implantable Lead Location: 753859
Implantable Lead Location: 753860
Implantable Lead Model: 5076
Lead Channel Impedance Value: 342 Ohm
Lead Channel Impedance Value: 399 Ohm
Lead Channel Impedance Value: 475 Ohm
Lead Channel Pacing Threshold Amplitude: 0.875 V
Lead Channel Sensing Intrinsic Amplitude: 1.875 mV
Lead Channel Sensing Intrinsic Amplitude: 13.125 mV
Lead Channel Sensing Intrinsic Amplitude: 13.125 mV
Lead Channel Setting Pacing Amplitude: 2 V
Lead Channel Setting Pacing Pulse Width: 0.4 ms
Lead Channel Setting Sensing Sensitivity: 2 mV
MDC IDC LEAD IMPLANT DT: 20151215
MDC IDC MSMT BATTERY REMAINING LONGEVITY: 61 mo
MDC IDC MSMT BATTERY VOLTAGE: 3 V
MDC IDC MSMT LEADCHNL RA IMPEDANCE VALUE: 380 Ohm
MDC IDC MSMT LEADCHNL RA PACING THRESHOLD AMPLITUDE: 0.625 V
MDC IDC MSMT LEADCHNL RA PACING THRESHOLD PULSEWIDTH: 0.4 ms
MDC IDC MSMT LEADCHNL RA SENSING INTR AMPL: 1.875 mV
MDC IDC MSMT LEADCHNL RV PACING THRESHOLD PULSEWIDTH: 0.4 ms
MDC IDC PG IMPLANT DT: 20151215
MDC IDC SESS DTM: 20191121235049
MDC IDC SET LEADCHNL RA PACING AMPLITUDE: 1.5 V

## 2018-05-26 ENCOUNTER — Ambulatory Visit (INDEPENDENT_AMBULATORY_CARE_PROVIDER_SITE_OTHER): Payer: Medicare Other

## 2018-05-26 DIAGNOSIS — R55 Syncope and collapse: Secondary | ICD-10-CM

## 2018-05-26 DIAGNOSIS — I495 Sick sinus syndrome: Secondary | ICD-10-CM | POA: Diagnosis not present

## 2018-05-28 ENCOUNTER — Other Ambulatory Visit: Payer: Self-pay | Admitting: Cardiovascular Disease

## 2018-05-28 LAB — CUP PACEART REMOTE DEVICE CHECK
Brady Statistic AP VP Percent: 41.05 %
Brady Statistic AP VS Percent: 44.85 %
Brady Statistic AS VP Percent: 13.5 %
Brady Statistic AS VS Percent: 0.59 %
Brady Statistic RV Percent Paced: 54.56 %
Date Time Interrogation Session: 20200220132746
Implantable Lead Implant Date: 20151215
Implantable Lead Location: 753860
Implantable Lead Model: 5076
Implantable Pulse Generator Implant Date: 20151215
Lead Channel Impedance Value: 380 Ohm
Lead Channel Impedance Value: 399 Ohm
Lead Channel Impedance Value: 437 Ohm
Lead Channel Impedance Value: 475 Ohm
Lead Channel Pacing Threshold Amplitude: 0.875 V
Lead Channel Sensing Intrinsic Amplitude: 12.25 mV
Lead Channel Sensing Intrinsic Amplitude: 12.25 mV
Lead Channel Sensing Intrinsic Amplitude: 2.25 mV
Lead Channel Setting Pacing Amplitude: 2 V
Lead Channel Setting Sensing Sensitivity: 2 mV
MDC IDC LEAD IMPLANT DT: 20151215
MDC IDC LEAD LOCATION: 753859
MDC IDC MSMT BATTERY REMAINING LONGEVITY: 57 mo
MDC IDC MSMT BATTERY VOLTAGE: 2.99 V
MDC IDC MSMT LEADCHNL RA PACING THRESHOLD AMPLITUDE: 0.75 V
MDC IDC MSMT LEADCHNL RA PACING THRESHOLD PULSEWIDTH: 0.4 ms
MDC IDC MSMT LEADCHNL RA SENSING INTR AMPL: 2.25 mV
MDC IDC MSMT LEADCHNL RV PACING THRESHOLD PULSEWIDTH: 0.4 ms
MDC IDC SET LEADCHNL RA PACING AMPLITUDE: 1.5 V
MDC IDC SET LEADCHNL RV PACING PULSEWIDTH: 0.4 ms
MDC IDC STAT BRADY RA PERCENT PACED: 81.39 %

## 2018-06-01 DIAGNOSIS — J069 Acute upper respiratory infection, unspecified: Secondary | ICD-10-CM | POA: Diagnosis not present

## 2018-06-02 NOTE — Progress Notes (Signed)
Remote pacemaker transmission.   

## 2018-06-03 ENCOUNTER — Encounter: Payer: Self-pay | Admitting: Cardiology

## 2018-06-04 ENCOUNTER — Other Ambulatory Visit: Payer: Self-pay | Admitting: Cardiovascular Disease

## 2018-06-06 NOTE — Telephone Encounter (Signed)
Rx(s) sent to pharmacy electronically.  

## 2018-06-28 ENCOUNTER — Other Ambulatory Visit: Payer: Self-pay

## 2018-06-28 ENCOUNTER — Telehealth: Payer: Self-pay

## 2018-06-28 ENCOUNTER — Encounter: Payer: Self-pay | Admitting: Cardiovascular Disease

## 2018-06-28 MED ORDER — VERAPAMIL HCL ER 240 MG PO TBCR: EXTENDED_RELEASE_TABLET | ORAL | 3 refills | Status: DC

## 2018-06-28 NOTE — Telephone Encounter (Signed)
Called patient no answer.Left message on personal voice to return my call.Dr.Croitoru sent me a message about cancelling 3/27 appointment and he has made medication changes.

## 2018-06-28 NOTE — Telephone Encounter (Signed)
Spoke to patient Dr.Croitoru advised to stop Isosorbide.Stop Diltiazem.Advised to start Verapamil 240 mg daily.Advised to monitor B/P daily.Send readings through my chart in 2 to 3 weeks.07/01/18 appointment with Dr.Croitoru cancelled.Scheduler will call back to reschedule appointment in 3 months.

## 2018-06-28 NOTE — Progress Notes (Signed)
Called Kelsey Joseph to cancel the upcoming appointment due to the coronavirus outbreak. She is doing well and has not had angina in months. Reports her BP runs low. Has had loss of taste and wonders whether the diltiazem could be responsible. We went over her recent pacemaker download.  Cancel appt. Reschedule in 3 months. Stop isosorbide and diltiazem. Start sustained release verapamil 240 mg once daily. Send [place] BP readings in a few weeks.  MCr

## 2018-07-01 ENCOUNTER — Encounter: Payer: Medicare Other | Admitting: Cardiovascular Disease

## 2018-07-21 IMAGING — DX DG CHEST 1V
1 series · 1 of 1 positions shown · non-contrast
Comparison: Radiographs June 07, 2017.

CLINICAL DATA: Pneumonia.

EXAM:
CHEST 1 VIEW

[chest ap]
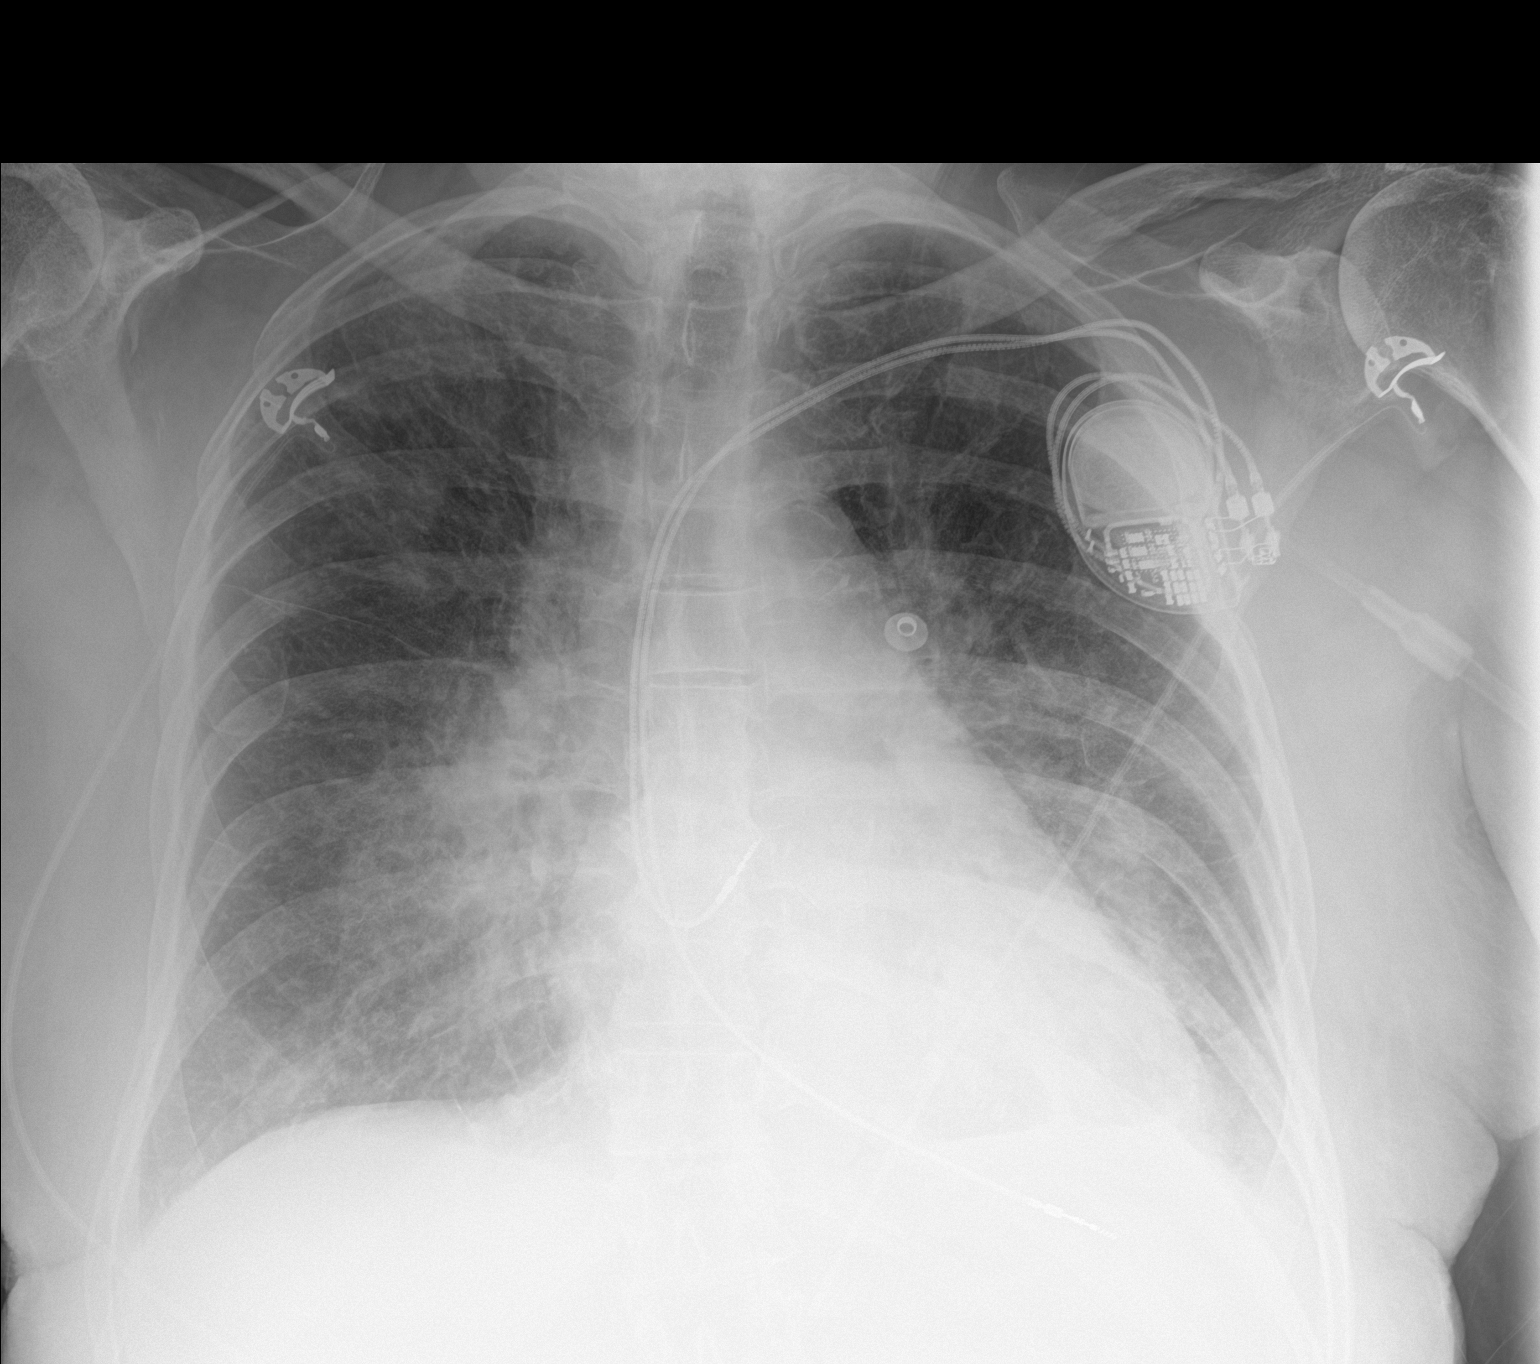

[1 of 1 positions shown; findings below may reference images not displayed]

FINDINGS: The heart size and mediastinal contours are within normal limits.
Left-sided pacemaker is unchanged in position.. No pneumothorax is
noted. Bilateral perihilar opacities are noted concerning for
inflammation or edema.. Minimal pleural effusions may be present.
The visualized skeletal structures are unremarkable.
IMPRESSION: Bilateral perihilar opacities are noted concerning for edema or
inflammation. These are not significantly changed compared to prior
exam.

## 2018-08-05 IMAGING — CT CT CHEST W/O CM
2 of 3 series · 15 of 36 positions shown, 18 images · non-contrast
Comparison: 06/07/2017 chest CT.  06/09/2017 chest radiographs.

CLINICAL DATA: Follow-up multilobar pneumonia and mediastinal
adenopathy.

EXAM:
CT CHEST WITHOUT CONTRAST
TECHNIQUE: Multidetector CT imaging of the chest was performed following the
standard protocol without IV contrast.

[Series 2: thorax · axial · 0.71mm/px · z∈[-260,-8]mm · 12 of 148 slices shown, 15 images]
[im 11/148  mediastinal]
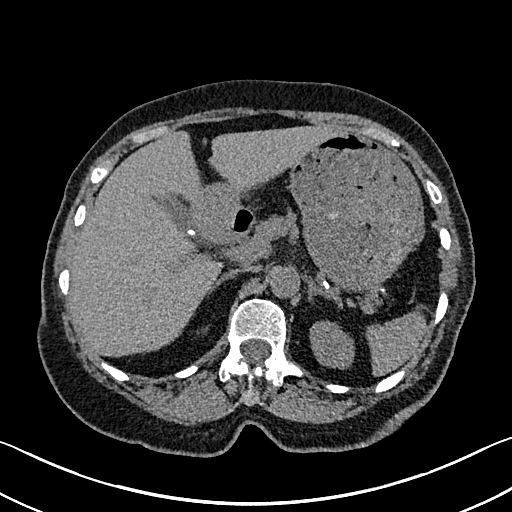
[im 11/148  lung]
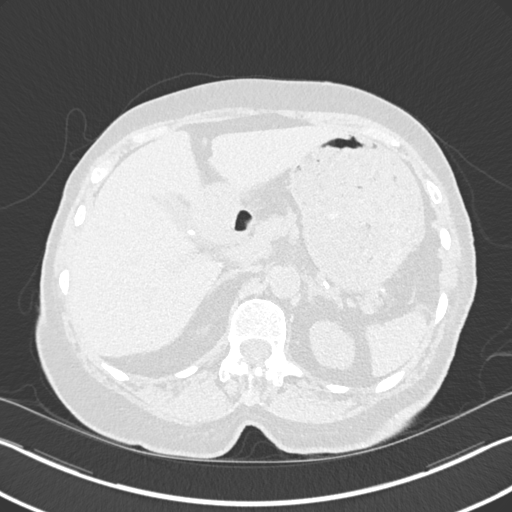
[im 22/148  lung]
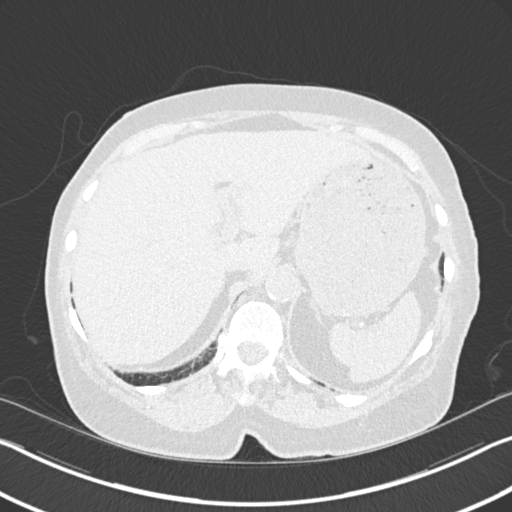
[im 33/148  lung]
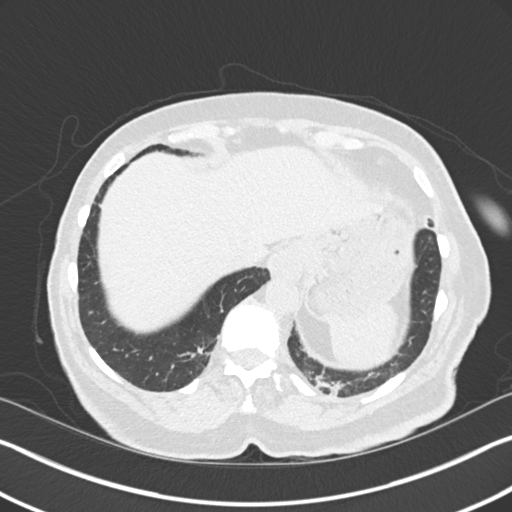
[im 44/148  lung]
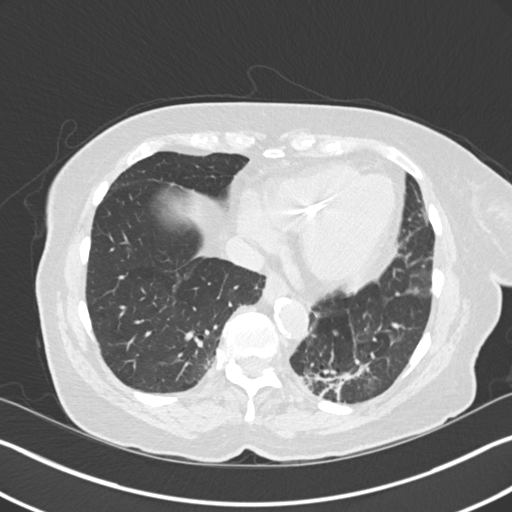
[im 55/148  mediastinal]
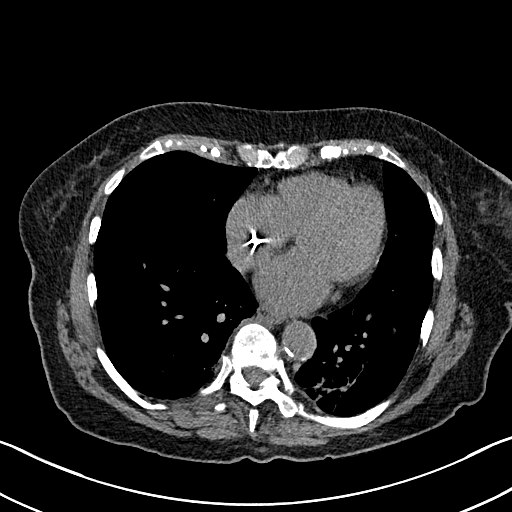
[im 55/148  lung]
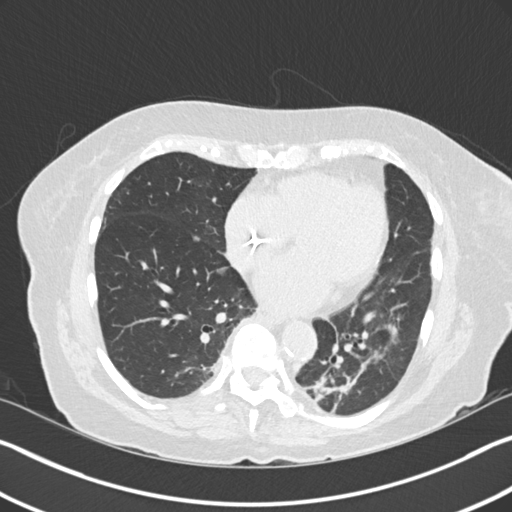
[im 66/148  lung]
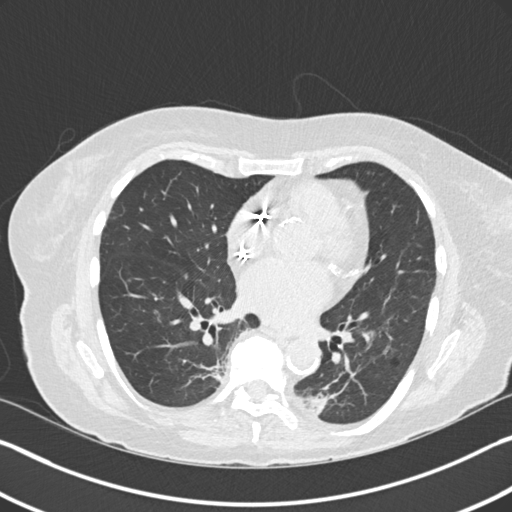
[im 82/148  lung]
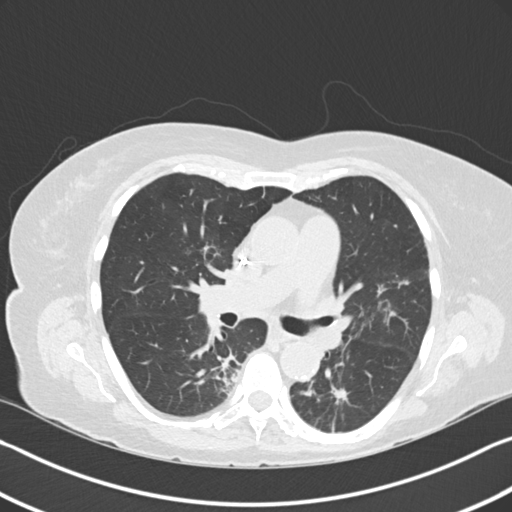
[im 93/148  lung]
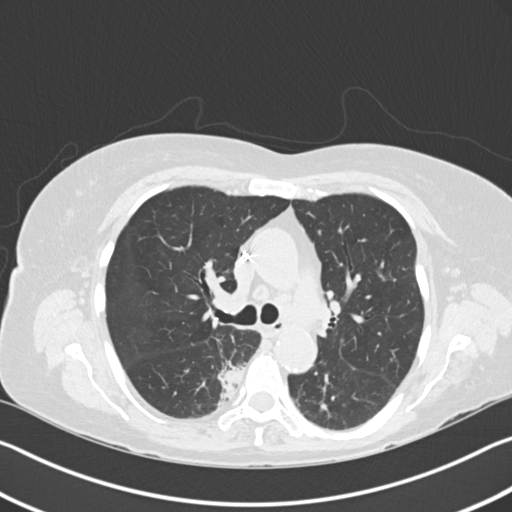
[im 104/148  mediastinal]
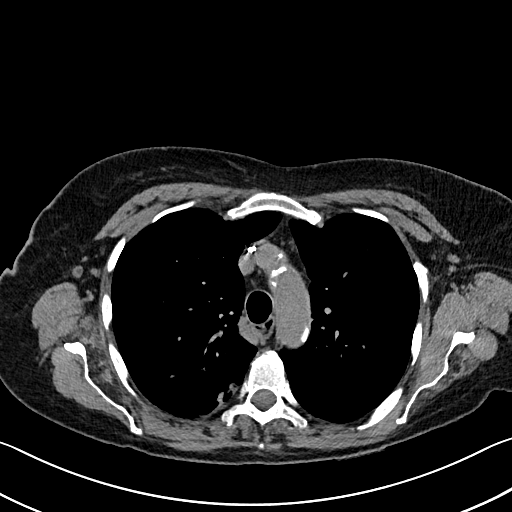
[im 104/148  lung]
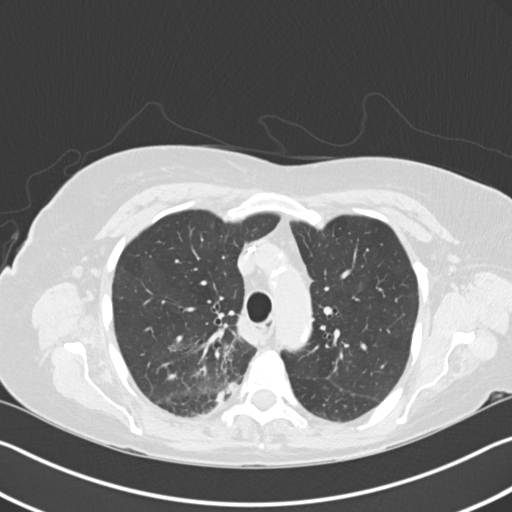
[im 115/148  lung]
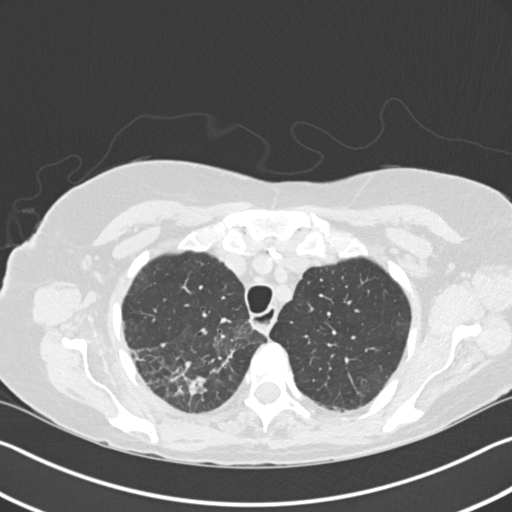
[im 126/148  lung]
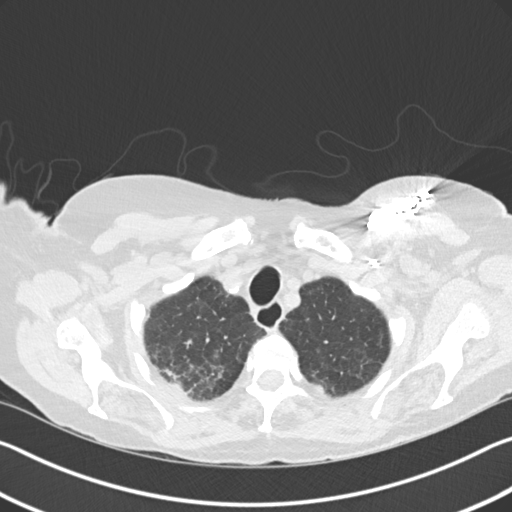
[im 137/148  lung]
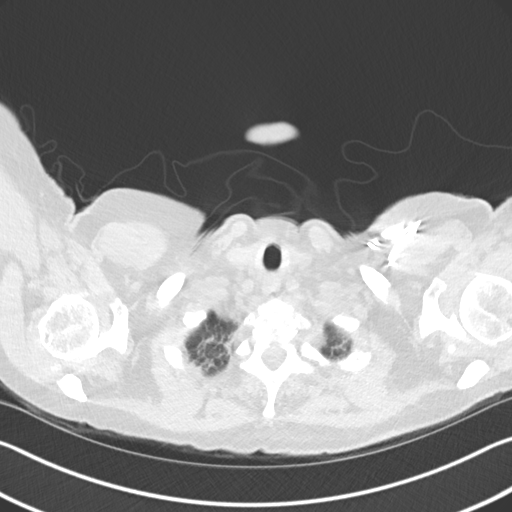

[Series 5: coronal · coronal · 0.59mm/px · 3 of 117 slices shown]
[im 24/117  lung]
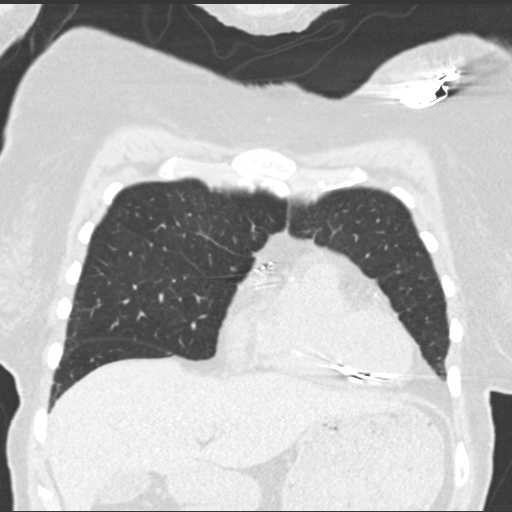
[im 47/117  lung]
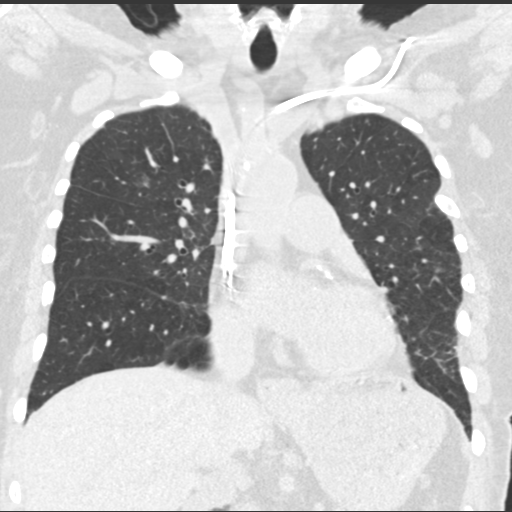
[im 70/117  lung]
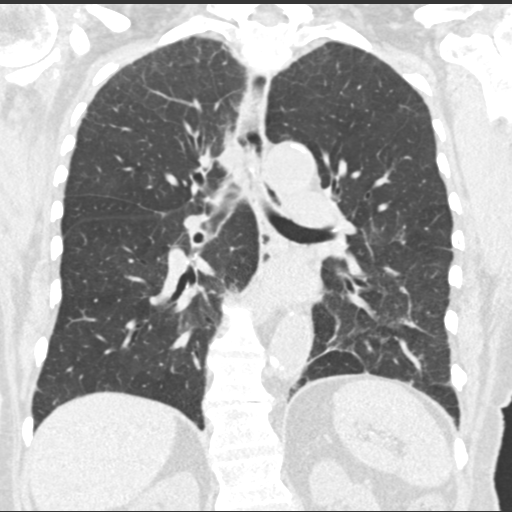

[15 of 36 positions shown; findings below may reference images not displayed]

FINDINGS: Cardiovascular: Normal heart size. No significant pericardial
fluid/thickening. Three-vessel coronary atherosclerosis. Two lead
left subclavian pacemaker is noted with lead tips in the right
atrium and right ventricular apex. Atherosclerotic nonaneurysmal
thoracic aorta. Normal caliber pulmonary arteries.

Mediastinum/Nodes: No discrete thyroid nodules. Unremarkable
esophagus. No pathologically enlarged axillary, mediastinal or gross
hilar lymph nodes, noting limited sensitivity for the detection of
hilar adenopathy on this noncontrast study. Previously visualized
mildly prominent paratracheal and subcarinal mediastinal nodes have
decreased in size and are now subcentimeter and normal in size.

Lungs/Pleura: No pneumothorax. No pleural effusion. Patchy
consolidation and ground-glass opacity throughout both lungs
involving all lung lobes, most prominent in the right upper lobe and
bilateral medial lower lobes, substantially decreased in the
interval. No discrete lung masses or significant pulmonary nodules.

Upper abdomen: Small hiatal hernia.  Cholelithiasis.

Musculoskeletal: No aggressive appearing focal osseous lesions.
Marked thoracic spondylosis. Superficial subcutaneous 0.9 cm soft
tissue density lesion in the ventral high right chest wall (series
2/image 41), stable.
IMPRESSION: 1. Patchy consolidation and ground-glass opacity throughout both
lungs, substantially decreased since 06/07/2017 chest CT, compatible
with resolving multilobar pneumonia. Recommend follow-up PA and
lateral post treatment chest radiographs in 6-8 weeks to document
resolution.
2. No residual thoracic adenopathy.
3. Three-vessel coronary atherosclerosis.
4. Small hiatal hernia.
5. Cholelithiasis.
6. Small 0.9 cm soft tissue density lesion in the superficial
subcutaneous ventral high right chest wall, stable, nonspecific,
potentially a sebaceous cyst, recommend correlation with clinical
exam.

Aortic Atherosclerosis ([id number]).

## 2018-08-24 DIAGNOSIS — I252 Old myocardial infarction: Secondary | ICD-10-CM | POA: Diagnosis not present

## 2018-08-24 DIAGNOSIS — E119 Type 2 diabetes mellitus without complications: Secondary | ICD-10-CM | POA: Diagnosis not present

## 2018-08-24 DIAGNOSIS — M25511 Pain in right shoulder: Secondary | ICD-10-CM | POA: Diagnosis not present

## 2018-08-24 DIAGNOSIS — I10 Essential (primary) hypertension: Secondary | ICD-10-CM | POA: Diagnosis not present

## 2018-08-24 DIAGNOSIS — M25512 Pain in left shoulder: Secondary | ICD-10-CM | POA: Diagnosis not present

## 2018-08-24 DIAGNOSIS — L84 Corns and callosities: Secondary | ICD-10-CM | POA: Diagnosis not present

## 2018-08-25 ENCOUNTER — Ambulatory Visit (INDEPENDENT_AMBULATORY_CARE_PROVIDER_SITE_OTHER): Payer: Medicare Other | Admitting: *Deleted

## 2018-08-25 DIAGNOSIS — I495 Sick sinus syndrome: Secondary | ICD-10-CM

## 2018-08-25 LAB — CUP PACEART REMOTE DEVICE CHECK
Battery Remaining Longevity: 63 mo
Battery Voltage: 3 V
Brady Statistic AP VP Percent: 13.37 %
Brady Statistic AP VS Percent: 71.55 %
Brady Statistic AS VP Percent: 14.4 %
Brady Statistic AS VS Percent: 0.68 %
Brady Statistic RA Percent Paced: 84.63 %
Brady Statistic RV Percent Paced: 27.9 %
Date Time Interrogation Session: 20200521133234
Implantable Lead Implant Date: 20151215
Implantable Lead Implant Date: 20151215
Implantable Lead Location: 753859
Implantable Lead Location: 753860
Implantable Lead Model: 5076
Implantable Lead Model: 5076
Implantable Pulse Generator Implant Date: 20151215
Lead Channel Impedance Value: 399 Ohm
Lead Channel Impedance Value: 418 Ohm
Lead Channel Impedance Value: 437 Ohm
Lead Channel Impedance Value: 513 Ohm
Lead Channel Pacing Threshold Amplitude: 0.875 V
Lead Channel Pacing Threshold Pulse Width: 0.4 ms
Lead Channel Sensing Intrinsic Amplitude: 1.75 mV
Lead Channel Sensing Intrinsic Amplitude: 18.375 mV
Lead Channel Setting Pacing Amplitude: 1.75 V
Lead Channel Setting Pacing Amplitude: 2 V
Lead Channel Setting Pacing Pulse Width: 0.4 ms
Lead Channel Setting Sensing Sensitivity: 2 mV

## 2018-09-05 ENCOUNTER — Encounter: Payer: Self-pay | Admitting: Cardiology

## 2018-09-05 NOTE — Progress Notes (Signed)
Remote pacemaker transmission.   

## 2018-09-07 ENCOUNTER — Telehealth: Payer: Self-pay | Admitting: Cardiovascular Disease

## 2018-09-07 NOTE — Telephone Encounter (Signed)
Video visit/my chart/pre reg complete/consent obtained/call [phone] -- ttf

## 2018-09-07 NOTE — Telephone Encounter (Signed)
Video visit

## 2018-09-13 NOTE — Telephone Encounter (Signed)

## 2018-09-14 ENCOUNTER — Telehealth (INDEPENDENT_AMBULATORY_CARE_PROVIDER_SITE_OTHER): Payer: Medicare Other | Admitting: Cardiovascular Disease

## 2018-09-14 ENCOUNTER — Encounter: Payer: Self-pay | Admitting: Cardiovascular Disease

## 2018-09-14 DIAGNOSIS — E7800 Pure hypercholesterolemia, unspecified: Secondary | ICD-10-CM

## 2018-09-14 DIAGNOSIS — I495 Sick sinus syndrome: Secondary | ICD-10-CM | POA: Diagnosis not present

## 2018-09-14 DIAGNOSIS — Z950 Presence of cardiac pacemaker: Secondary | ICD-10-CM

## 2018-09-14 DIAGNOSIS — I480 Paroxysmal atrial fibrillation: Secondary | ICD-10-CM

## 2018-09-14 DIAGNOSIS — Z89621 Acquired absence of right hip joint: Secondary | ICD-10-CM

## 2018-09-14 DIAGNOSIS — I10 Essential (primary) hypertension: Secondary | ICD-10-CM | POA: Diagnosis not present

## 2018-09-14 DIAGNOSIS — I4719 Other supraventricular tachycardia: Secondary | ICD-10-CM

## 2018-09-14 DIAGNOSIS — D500 Iron deficiency anemia secondary to blood loss (chronic): Secondary | ICD-10-CM | POA: Diagnosis not present

## 2018-09-14 DIAGNOSIS — I25118 Atherosclerotic heart disease of native coronary artery with other forms of angina pectoris: Secondary | ICD-10-CM | POA: Diagnosis not present

## 2018-09-14 DIAGNOSIS — I5032 Chronic diastolic (congestive) heart failure: Secondary | ICD-10-CM

## 2018-09-14 DIAGNOSIS — I471 Supraventricular tachycardia: Secondary | ICD-10-CM | POA: Diagnosis not present

## 2018-09-14 DIAGNOSIS — E119 Type 2 diabetes mellitus without complications: Secondary | ICD-10-CM | POA: Diagnosis not present

## 2018-09-14 MED ORDER — NITROGLYCERIN 0.4 MG SL SUBL: 0.4000 mg | SUBLINGUAL_TABLET | SUBLINGUAL | 0 refills | Status: DC | PRN

## 2018-09-14 NOTE — Patient Instructions (Addendum)
Medication Instructions:  Please hold your rosuvastatin (crestor) for one month to see if the muscle aches go away. Call the office at [phone], towards the end of July to give [place] an update. We may need to change medications.  If you need a refill on your cardiac medications before your next appointment, please call your pharmacy.   Lab work: None ordered  Testing/Procedures: None ordered  Follow-Up: At [facility], you and your health needs are our priority.  As part of our continuing mission to provide you with exceptional heart care, we have created designated Provider Care Teams.  These Care Teams include your primary Cardiologist (physician) and Advanced Practice Providers (APPs -  Physician Assistants and Nurse Practitioners) who all work together to provide you with the care you need, when you need it. You will need a follow up appointment in 6 months.  Please call our office 2 months in advance to schedule this appointment.  You may see Mihai Croitoru. MD or one of the following Advanced Practice Providers on your designated Care Team: Almyra Deforest, PA-C Fabian Sharp, [place]
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
## 2018-09-15 ENCOUNTER — Other Ambulatory Visit: Payer: Self-pay | Admitting: Cardiovascular Disease

## 2018-09-16 NOTE — Progress Notes (Signed)
Weird. I wonder what's happening with these televisits. I am pretty sure I clicked the visit diagnoses, but they seem to melt away sometimes..inhaled corticosteroids it just me with these mising diagnoses. MCr

## 2018-09-26 DIAGNOSIS — M75101 Unspecified rotator cuff tear or rupture of right shoulder, not specified as traumatic: Secondary | ICD-10-CM | POA: Diagnosis not present

## 2018-09-26 DIAGNOSIS — R109 Unspecified abdominal pain: Secondary | ICD-10-CM | POA: Diagnosis not present

## 2018-09-26 DIAGNOSIS — G588 Other specified mononeuropathies: Secondary | ICD-10-CM | POA: Diagnosis not present

## 2018-09-26 DIAGNOSIS — B0229 Other postherpetic nervous system involvement: Secondary | ICD-10-CM | POA: Diagnosis not present

## 2018-10-03 DIAGNOSIS — R109 Unspecified abdominal pain: Secondary | ICD-10-CM | POA: Diagnosis not present

## 2018-10-03 DIAGNOSIS — B0229 Other postherpetic nervous system involvement: Secondary | ICD-10-CM | POA: Diagnosis not present

## 2018-10-03 DIAGNOSIS — G894 Chronic pain syndrome: Secondary | ICD-10-CM | POA: Diagnosis not present

## 2018-10-03 DIAGNOSIS — G588 Other specified mononeuropathies: Secondary | ICD-10-CM | POA: Diagnosis not present

## 2018-10-11 ENCOUNTER — Other Ambulatory Visit: Payer: Self-pay

## 2018-10-11 ENCOUNTER — Emergency Department (HOSPITAL_COMMUNITY)
Admission: EM | Admit: 2018-10-11 | Discharge: 2018-10-11 | Disposition: A | Discharge status: Home / Self Care | Payer: Medicare Other | Attending: Emergency Medicine | Admitting: Emergency Medicine

## 2018-10-11 ENCOUNTER — Emergency Department (HOSPITAL_COMMUNITY): Payer: Medicare Other

## 2018-10-11 ENCOUNTER — Encounter (HOSPITAL_COMMUNITY): Payer: Self-pay | Admitting: Emergency Medicine

## 2018-10-11 DIAGNOSIS — I959 Hypotension, unspecified: Secondary | ICD-10-CM | POA: Diagnosis not present

## 2018-10-11 DIAGNOSIS — R0789 Other chest pain: Secondary | ICD-10-CM | POA: Insufficient documentation

## 2018-10-11 DIAGNOSIS — I4891 Unspecified atrial fibrillation: Secondary | ICD-10-CM | POA: Insufficient documentation

## 2018-10-11 DIAGNOSIS — E039 Hypothyroidism, unspecified: Secondary | ICD-10-CM | POA: Insufficient documentation

## 2018-10-11 DIAGNOSIS — Z79899 Other long term (current) drug therapy: Secondary | ICD-10-CM | POA: Insufficient documentation

## 2018-10-11 DIAGNOSIS — Z91040 Latex allergy status: Secondary | ICD-10-CM | POA: Diagnosis not present

## 2018-10-11 DIAGNOSIS — R51 Headache: Secondary | ICD-10-CM | POA: Diagnosis not present

## 2018-10-11 DIAGNOSIS — N183 Chronic kidney disease, stage 3 (moderate): Secondary | ICD-10-CM | POA: Diagnosis not present

## 2018-10-11 DIAGNOSIS — I130 Hypertensive heart and chronic kidney disease with heart failure and stage 1 through stage 4 chronic kidney disease, or unspecified chronic kidney disease: Secondary | ICD-10-CM | POA: Diagnosis not present

## 2018-10-11 DIAGNOSIS — I252 Old myocardial infarction: Secondary | ICD-10-CM | POA: Diagnosis not present

## 2018-10-11 DIAGNOSIS — I5032 Chronic diastolic (congestive) heart failure: Secondary | ICD-10-CM | POA: Diagnosis not present

## 2018-10-11 DIAGNOSIS — Z950 Presence of cardiac pacemaker: Secondary | ICD-10-CM | POA: Insufficient documentation

## 2018-10-11 DIAGNOSIS — R079 Chest pain, unspecified: Secondary | ICD-10-CM | POA: Diagnosis not present

## 2018-10-11 DIAGNOSIS — Z7982 Long term (current) use of aspirin: Secondary | ICD-10-CM | POA: Diagnosis not present

## 2018-10-11 LAB — BASIC METABOLIC PANEL
Anion gap: 10 (ref 5–15)
BUN: 42 mg/dL — ABNORMAL HIGH (ref 8–23)
CO2: 31 mmol/L (ref 22–32)
Calcium: 9.5 mg/dL (ref 8.9–10.3)
Chloride: 97 mmol/L — ABNORMAL LOW (ref 98–111)
Creatinine, Ser: 1.11 mg/dL — ABNORMAL HIGH (ref 0.44–1.00)
GFR calc Af Amer: 51 mL/min — ABNORMAL LOW (ref >60)
GFR calc non Af Amer: 44 mL/min — ABNORMAL LOW (ref >60)
Glucose, Bld: 146 mg/dL — ABNORMAL HIGH (ref 70–99)
Potassium: 3.6 mmol/L (ref 3.5–5.1)
Sodium: 138 mmol/L (ref 135–145)

## 2018-10-11 LAB — CBC
HCT: 44.2 % (ref 36.0–46.0)
Hemoglobin: 14.7 g/dL (ref 12.0–15.0)
MCH: 31 pg (ref 26.0–34.0)
MCHC: 33.3 g/dL (ref 30.0–36.0)
MCV: 93.2 fL (ref 80.0–100.0)
Platelets: 248 10*3/uL (ref 150–400)
RBC: 4.74 MIL/uL (ref 3.87–5.11)
RDW: 14.3 % (ref 11.5–15.5)
WBC: 8.9 10*3/uL (ref 4.0–10.5)
nRBC: 0 % (ref 0.0–0.2)

## 2018-10-11 LAB — TROPONIN I (HIGH SENSITIVITY)
Troponin I (High Sensitivity): 7 ng/L (ref <18)
Troponin I (High Sensitivity): 7 ng/L (ref <18)

## 2018-10-11 MED ORDER — NITROGLYCERIN 0.4 MG SL SUBL: 0.4000 mg | SUBLINGUAL_TABLET | SUBLINGUAL | 1 refills | Status: DC | PRN

## 2018-10-11 MED ORDER — LIDOCAINE VISCOUS HCL 2 % MT SOLN
15.0000 mL | Freq: Once | OROMUCOSAL | Status: AC
  Administered 2018-10-11: 15 mL via ORAL
  Filled 2018-10-11: qty 15

## 2018-10-11 MED ORDER — ALUM & MAG HYDROXIDE-SIMETH 200-200-20 MG/5ML PO SUSP
30.0000 mL | Freq: Once | ORAL | Status: AC
  Administered 2018-10-11: 30 mL via ORAL
  Filled 2018-10-11: qty 30

## 2018-10-11 MED ORDER — SODIUM CHLORIDE 0.9% FLUSH: 3.0000 mL | Freq: Once | INTRAVENOUS | Status: DC

## 2018-10-11 NOTE — Telephone Encounter (Signed)
Pt son Kelsey Joseph-walked into our office complaining of wait for telephone call and pt having CP, arrhythmia and stomach issues today. He was not willing to discuss these. He states all he wants/needs is a nitro refill. Informed Eddie Dibbles that if pt with pacemaker is having CP and all sx listed above she needs to go to the ER. As he was walking away he stated "I know" and walked out.

## 2018-10-11 NOTE — Discharge Instructions (Signed)
Read instructions below for reasons to return to the Emergency Department. It is recommended that your follow up with your cardiologist in regards to today's visit.  Please call tomorrow to schedule follow-up appointment within 2-5 days.  Tests performed today include: An EKG of your heart A chest x-ray Cardiac enzymes - a blood test for heart muscle damage Blood counts and electrolytes  Chest Pain (Nonspecific)  HOME CARE INSTRUCTIONS  -For the next few days, avoid physical activities that bring on chest pain. Continue physical activities as directed.  -Only take over-the-counter or prescription medicine for pain, discomfort, or fever as directed by your caregiver.  -Follow your caregiver's suggestions for further testing if your chest pain does not go away.  -Keep any follow-up appointments you made. If you do not go to an appointment, you could develop lasting (chronic) problems with pain. If there is any problem keeping an appointment, you must call to reschedule.   SEEK MEDICAL CARE IF:  You think you are having problems from the medicine you are taking. Read your medicine instructions carefully.  Your chest pain does not go away, even after treatment.  You develop a rash with blisters on your chest.   SEEK IMMEDIATE MEDICAL CARE IF:  You have increased chest pain or pain that spreads to your arm, neck, jaw, back, or belly (abdomen).  You develop shortness of breath, an increasing cough, or you are coughing up blood.  You have severe back or abdominal pain, feel sick to your stomach (nauseous) or throw up (vomit).  You develop severe weakness, fainting, or chills.  You have an oral temperature above 102 F (38.9 C), not controlled by medicine.   THIS IS AN EMERGENCY. Do not wait to see if the pain will go away. Get medical help at once. Call 911. Do not drive yourself to the hospital.

## 2018-10-11 NOTE — ED Notes (Signed)
Son-in law [phone] please call w/ updates he is outside

## 2018-10-11 NOTE — ED Triage Notes (Signed)
Pt c/o CP that started around 1500.  Pt states she took a total of 2 Nitro and 3 baby ASA.  Medic stated they gave her a 4th baby ASA for a total of 324mg .  Pt is alert and oriented.  No distress noted.  Pacemaker in place
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
## 2018-10-11 NOTE — ED Notes (Signed)
Patient verbalizes understanding of discharge instructions. Opportunity for questioning and answers were provided. Armband removed by staff, pt discharged from ED.  

## 2018-10-11 NOTE — ED Notes (Signed)
X-ray at bedside

## 2018-10-20 DIAGNOSIS — M25519 Pain in unspecified shoulder: Secondary | ICD-10-CM | POA: Diagnosis not present

## 2018-10-20 DIAGNOSIS — M75101 Unspecified rotator cuff tear or rupture of right shoulder, not specified as traumatic: Secondary | ICD-10-CM | POA: Diagnosis not present

## 2018-10-31 DIAGNOSIS — B0229 Other postherpetic nervous system involvement: Secondary | ICD-10-CM | POA: Diagnosis not present

## 2018-10-31 DIAGNOSIS — R109 Unspecified abdominal pain: Secondary | ICD-10-CM | POA: Diagnosis not present

## 2018-10-31 DIAGNOSIS — M75101 Unspecified rotator cuff tear or rupture of right shoulder, not specified as traumatic: Secondary | ICD-10-CM | POA: Diagnosis not present

## 2018-10-31 DIAGNOSIS — G894 Chronic pain syndrome: Secondary | ICD-10-CM | POA: Diagnosis not present

## 2018-11-24 ENCOUNTER — Ambulatory Visit (INDEPENDENT_AMBULATORY_CARE_PROVIDER_SITE_OTHER): Payer: Medicare Other | Admitting: *Deleted

## 2018-11-24 DIAGNOSIS — R55 Syncope and collapse: Secondary | ICD-10-CM

## 2018-11-24 DIAGNOSIS — I495 Sick sinus syndrome: Secondary | ICD-10-CM

## 2018-11-27 LAB — CUP PACEART REMOTE DEVICE CHECK
Battery Remaining Longevity: 64 mo
Battery Voltage: 3 V
Brady Statistic AP VP Percent: 3.13 %
Brady Statistic AP VS Percent: 91.31 %
Brady Statistic AS VP Percent: 3.56 %
Brady Statistic AS VS Percent: 2.01 %
Brady Statistic RA Percent Paced: 94.09 %
Brady Statistic RV Percent Paced: 6.81 %
Date Time Interrogation Session: 20200821150928
Implantable Lead Implant Date: 20151215
Implantable Lead Implant Date: 20151215
Implantable Lead Location: 753859
Implantable Lead Location: 753860
Implantable Lead Model: 5076
Implantable Lead Model: 5076
Implantable Pulse Generator Implant Date: 20151215
Lead Channel Impedance Value: 380 Ohm
Lead Channel Impedance Value: 418 Ohm
Lead Channel Impedance Value: 437 Ohm
Lead Channel Impedance Value: 513 Ohm
Lead Channel Pacing Threshold Amplitude: 0.875 V
Lead Channel Pacing Threshold Amplitude: 0.875 V
Lead Channel Pacing Threshold Pulse Width: 0.4 ms
Lead Channel Pacing Threshold Pulse Width: 0.4 ms
Lead Channel Sensing Intrinsic Amplitude: 1.625 mV
Lead Channel Sensing Intrinsic Amplitude: 1.625 mV
Lead Channel Sensing Intrinsic Amplitude: 20.75 mV
Lead Channel Sensing Intrinsic Amplitude: 20.75 mV
Lead Channel Setting Pacing Amplitude: 1.75 V
Lead Channel Setting Pacing Amplitude: 2 V
Lead Channel Setting Pacing Pulse Width: 0.4 ms
Lead Channel Setting Sensing Sensitivity: 2 mV

## 2018-11-30 ENCOUNTER — Encounter (HOSPITAL_BASED_OUTPATIENT_CLINIC_OR_DEPARTMENT_OTHER): Payer: Medicare Other | Attending: Physician Assistant

## 2018-11-30 ENCOUNTER — Encounter: Payer: Self-pay | Admitting: Cardiovascular Disease

## 2018-11-30 ENCOUNTER — Other Ambulatory Visit: Payer: Self-pay

## 2018-11-30 DIAGNOSIS — L97822 Non-pressure chronic ulcer of other part of left lower leg with fat layer exposed: Secondary | ICD-10-CM | POA: Diagnosis not present

## 2018-11-30 DIAGNOSIS — Z89612 Acquired absence of left leg above knee: Secondary | ICD-10-CM | POA: Diagnosis not present

## 2018-11-30 DIAGNOSIS — Z87891 Personal history of nicotine dependence: Secondary | ICD-10-CM | POA: Diagnosis not present

## 2018-11-30 DIAGNOSIS — I2510 Atherosclerotic heart disease of native coronary artery without angina pectoris: Secondary | ICD-10-CM | POA: Insufficient documentation

## 2018-11-30 DIAGNOSIS — E11622 Type 2 diabetes mellitus with other skin ulcer: Secondary | ICD-10-CM | POA: Diagnosis not present

## 2018-11-30 DIAGNOSIS — I480 Paroxysmal atrial fibrillation: Secondary | ICD-10-CM | POA: Diagnosis not present

## 2018-11-30 DIAGNOSIS — G4730 Sleep apnea, unspecified: Secondary | ICD-10-CM | POA: Diagnosis not present

## 2018-11-30 DIAGNOSIS — I509 Heart failure, unspecified: Secondary | ICD-10-CM | POA: Diagnosis not present

## 2018-11-30 DIAGNOSIS — E1140 Type 2 diabetes mellitus with diabetic neuropathy, unspecified: Secondary | ICD-10-CM | POA: Insufficient documentation

## 2018-11-30 DIAGNOSIS — Z96652 Presence of left artificial knee joint: Secondary | ICD-10-CM | POA: Insufficient documentation

## 2018-11-30 DIAGNOSIS — I5042 Chronic combined systolic (congestive) and diastolic (congestive) heart failure: Secondary | ICD-10-CM | POA: Insufficient documentation

## 2018-11-30 DIAGNOSIS — I10 Essential (primary) hypertension: Secondary | ICD-10-CM | POA: Diagnosis not present

## 2018-11-30 DIAGNOSIS — M8668 Other chronic osteomyelitis, other site: Secondary | ICD-10-CM | POA: Diagnosis not present

## 2018-11-30 DIAGNOSIS — I110 Hypertensive heart disease with heart failure: Secondary | ICD-10-CM | POA: Insufficient documentation

## 2018-11-30 DIAGNOSIS — E119 Type 2 diabetes mellitus without complications: Secondary | ICD-10-CM | POA: Diagnosis not present

## 2018-12-01 DIAGNOSIS — N390 Urinary tract infection, site not specified: Secondary | ICD-10-CM | POA: Diagnosis not present

## 2018-12-02 NOTE — Progress Notes (Signed)
Remote pacemaker transmission.   

## 2018-12-05 DIAGNOSIS — Z0000 Encounter for general adult medical examination without abnormal findings: Secondary | ICD-10-CM | POA: Diagnosis not present

## 2018-12-05 DIAGNOSIS — E119 Type 2 diabetes mellitus without complications: Secondary | ICD-10-CM | POA: Diagnosis not present

## 2018-12-05 DIAGNOSIS — E039 Hypothyroidism, unspecified: Secondary | ICD-10-CM | POA: Diagnosis not present

## 2018-12-05 DIAGNOSIS — R350 Frequency of micturition: Secondary | ICD-10-CM | POA: Diagnosis not present

## 2018-12-05 DIAGNOSIS — K219 Gastro-esophageal reflux disease without esophagitis: Secondary | ICD-10-CM | POA: Diagnosis not present

## 2018-12-05 DIAGNOSIS — Z23 Encounter for immunization: Secondary | ICD-10-CM | POA: Diagnosis not present

## 2018-12-05 DIAGNOSIS — G629 Polyneuropathy, unspecified: Secondary | ICD-10-CM | POA: Diagnosis not present

## 2018-12-05 DIAGNOSIS — E7800 Pure hypercholesterolemia, unspecified: Secondary | ICD-10-CM | POA: Diagnosis not present

## 2018-12-05 DIAGNOSIS — I4891 Unspecified atrial fibrillation: Secondary | ICD-10-CM | POA: Diagnosis not present

## 2018-12-05 DIAGNOSIS — I10 Essential (primary) hypertension: Secondary | ICD-10-CM | POA: Diagnosis not present

## 2018-12-05 DIAGNOSIS — B0229 Other postherpetic nervous system involvement: Secondary | ICD-10-CM | POA: Diagnosis not present

## 2018-12-05 DIAGNOSIS — G4700 Insomnia, unspecified: Secondary | ICD-10-CM | POA: Diagnosis not present

## 2018-12-07 ENCOUNTER — Other Ambulatory Visit: Payer: Self-pay

## 2018-12-07 ENCOUNTER — Encounter (HOSPITAL_BASED_OUTPATIENT_CLINIC_OR_DEPARTMENT_OTHER): Payer: Medicare Other | Attending: Physician Assistant

## 2018-12-07 DIAGNOSIS — E1140 Type 2 diabetes mellitus with diabetic neuropathy, unspecified: Secondary | ICD-10-CM | POA: Diagnosis not present

## 2018-12-07 DIAGNOSIS — Z96652 Presence of left artificial knee joint: Secondary | ICD-10-CM | POA: Insufficient documentation

## 2018-12-07 DIAGNOSIS — G4730 Sleep apnea, unspecified: Secondary | ICD-10-CM | POA: Insufficient documentation

## 2018-12-07 DIAGNOSIS — I480 Paroxysmal atrial fibrillation: Secondary | ICD-10-CM | POA: Diagnosis not present

## 2018-12-07 DIAGNOSIS — L97822 Non-pressure chronic ulcer of other part of left lower leg with fat layer exposed: Secondary | ICD-10-CM | POA: Diagnosis not present

## 2018-12-07 DIAGNOSIS — Z89611 Acquired absence of right leg above knee: Secondary | ICD-10-CM | POA: Diagnosis not present

## 2018-12-07 DIAGNOSIS — I110 Hypertensive heart disease with heart failure: Secondary | ICD-10-CM | POA: Diagnosis not present

## 2018-12-07 DIAGNOSIS — E11622 Type 2 diabetes mellitus with other skin ulcer: Secondary | ICD-10-CM | POA: Insufficient documentation

## 2018-12-07 DIAGNOSIS — I5042 Chronic combined systolic (congestive) and diastolic (congestive) heart failure: Secondary | ICD-10-CM | POA: Diagnosis not present

## 2018-12-21 DIAGNOSIS — D1801 Hemangioma of skin and subcutaneous tissue: Secondary | ICD-10-CM | POA: Diagnosis not present

## 2018-12-21 DIAGNOSIS — L820 Inflamed seborrheic keratosis: Secondary | ICD-10-CM | POA: Diagnosis not present

## 2018-12-21 DIAGNOSIS — Z85828 Personal history of other malignant neoplasm of skin: Secondary | ICD-10-CM | POA: Diagnosis not present

## 2018-12-21 DIAGNOSIS — L853 Xerosis cutis: Secondary | ICD-10-CM | POA: Diagnosis not present

## 2018-12-21 DIAGNOSIS — L821 Other seborrheic keratosis: Secondary | ICD-10-CM | POA: Diagnosis not present

## 2018-12-22 DIAGNOSIS — E11622 Type 2 diabetes mellitus with other skin ulcer: Secondary | ICD-10-CM | POA: Diagnosis not present

## 2018-12-22 DIAGNOSIS — I110 Hypertensive heart disease with heart failure: Secondary | ICD-10-CM | POA: Diagnosis not present

## 2018-12-22 DIAGNOSIS — I480 Paroxysmal atrial fibrillation: Secondary | ICD-10-CM | POA: Diagnosis not present

## 2018-12-22 DIAGNOSIS — L97822 Non-pressure chronic ulcer of other part of left lower leg with fat layer exposed: Secondary | ICD-10-CM | POA: Diagnosis not present

## 2018-12-22 DIAGNOSIS — G4730 Sleep apnea, unspecified: Secondary | ICD-10-CM | POA: Diagnosis not present

## 2018-12-22 DIAGNOSIS — I5042 Chronic combined systolic (congestive) and diastolic (congestive) heart failure: Secondary | ICD-10-CM | POA: Diagnosis not present

## 2018-12-28 DIAGNOSIS — I70202 Unspecified atherosclerosis of native arteries of extremities, left leg: Secondary | ICD-10-CM | POA: Diagnosis not present

## 2018-12-28 DIAGNOSIS — R0989 Other specified symptoms and signs involving the circulatory and respiratory systems: Secondary | ICD-10-CM | POA: Diagnosis not present

## 2018-12-29 ENCOUNTER — Other Ambulatory Visit (HOSPITAL_BASED_OUTPATIENT_CLINIC_OR_DEPARTMENT_OTHER): Payer: Self-pay | Admitting: Internal Medicine

## 2018-12-29 DIAGNOSIS — L905 Scar conditions and fibrosis of skin: Secondary | ICD-10-CM | POA: Diagnosis not present

## 2018-12-29 DIAGNOSIS — I480 Paroxysmal atrial fibrillation: Secondary | ICD-10-CM | POA: Diagnosis not present

## 2018-12-29 DIAGNOSIS — I110 Hypertensive heart disease with heart failure: Secondary | ICD-10-CM | POA: Diagnosis not present

## 2018-12-29 DIAGNOSIS — I5042 Chronic combined systolic (congestive) and diastolic (congestive) heart failure: Secondary | ICD-10-CM | POA: Diagnosis not present

## 2018-12-29 DIAGNOSIS — E11622 Type 2 diabetes mellitus with other skin ulcer: Secondary | ICD-10-CM | POA: Diagnosis not present

## 2018-12-29 DIAGNOSIS — L97822 Non-pressure chronic ulcer of other part of left lower leg with fat layer exposed: Secondary | ICD-10-CM | POA: Diagnosis not present

## 2018-12-29 DIAGNOSIS — G4730 Sleep apnea, unspecified: Secondary | ICD-10-CM | POA: Diagnosis not present

## 2018-12-29 DIAGNOSIS — S91302A Unspecified open wound, left foot, initial encounter: Secondary | ICD-10-CM | POA: Diagnosis not present

## 2019-01-06 ENCOUNTER — Encounter (HOSPITAL_BASED_OUTPATIENT_CLINIC_OR_DEPARTMENT_OTHER): Payer: Medicare Other | Attending: Internal Medicine | Admitting: Internal Medicine

## 2019-01-06 ENCOUNTER — Other Ambulatory Visit: Payer: Self-pay

## 2019-01-06 DIAGNOSIS — I2510 Atherosclerotic heart disease of native coronary artery without angina pectoris: Secondary | ICD-10-CM | POA: Diagnosis not present

## 2019-01-06 DIAGNOSIS — Z96652 Presence of left artificial knee joint: Secondary | ICD-10-CM | POA: Diagnosis not present

## 2019-01-06 DIAGNOSIS — E1140 Type 2 diabetes mellitus with diabetic neuropathy, unspecified: Secondary | ICD-10-CM | POA: Insufficient documentation

## 2019-01-06 DIAGNOSIS — L97822 Non-pressure chronic ulcer of other part of left lower leg with fat layer exposed: Secondary | ICD-10-CM | POA: Diagnosis not present

## 2019-01-06 DIAGNOSIS — I110 Hypertensive heart disease with heart failure: Secondary | ICD-10-CM | POA: Insufficient documentation

## 2019-01-06 DIAGNOSIS — I5042 Chronic combined systolic (congestive) and diastolic (congestive) heart failure: Secondary | ICD-10-CM | POA: Insufficient documentation

## 2019-01-06 DIAGNOSIS — Z89611 Acquired absence of right leg above knee: Secondary | ICD-10-CM | POA: Insufficient documentation

## 2019-01-06 DIAGNOSIS — G4730 Sleep apnea, unspecified: Secondary | ICD-10-CM | POA: Insufficient documentation

## 2019-01-06 DIAGNOSIS — E11622 Type 2 diabetes mellitus with other skin ulcer: Secondary | ICD-10-CM | POA: Diagnosis not present

## 2019-01-06 NOTE — Progress Notes (Signed)
FEY, DORSI ([id number]) Visit Report for 01/06/2019 Debridement Details Patient Name: Date of Service: Kelsey Joseph, Kelsey Joseph 01/06/2019 12:30 PM Medical Record [id number] Patient Account Number: [id number] Date of Birth/Sex: Treating RN: June 02, 1927 (85 y.o. Elam Dutch Primary Care Provider: Alden Server Other Clinician: Referring Provider: Treating Provider/Extender:Robson, Towana Badger, Rutherford Limerick in Treatment: 5 Debridement Performed for Wound #1 Left Knee Assessment: Performed By: Physician Ricard Dillon., MD Debridement Type: Debridement Severity of Tissue Pre Fat layer exposed Debridement: Level of Consciousness (Pre- Awake and Alert procedure): Pre-procedure Verification/Time Out Taken: Yes - 13:00 Start Time: 13:04 Pain Control: Lidocaine 5% topical ointment Total Area Debrided (L x W): 2.3 (cm) x 1.6 (cm) = 3.68 (cm) Tissue and other material Viable, Non-Viable, Slough, Subcutaneous, Slough debrided: Level: Skin/Subcutaneous Tissue Debridement Description: Excisional Instrument: Curette Bleeding: Minimum Hemostasis Achieved: Pressure End Time: 13:07 Procedural Pain: 0 Post Procedural Pain: 0 Response to Treatment: Procedure was tolerated well Level of Consciousness Awake and Alert (Post-procedure): Post Debridement Measurements of Total Wound Length: (cm) 2.3 Width: (cm) 1.6 Depth: (cm) 0.1 Volume: (cm) 0.289 Character of Wound/Ulcer Post Improved Debridement: Severity of Tissue Post Debridement: Fat layer exposed Post Procedure Diagnosis Same as Pre-procedure Electronic Signature(s) Signed: 01/06/2019 6:19:43 PM By: Linton Ham MD Signed: 01/06/2019 7:05:19 PM By: Baruch Gouty RN, BSN Entered By: Linton Ham on 01/06/2019 13:10:52 -------------------------------------------------------------------------------- HPI Details Patient Name: Date of Service: Kelsey Joseph, Kelsey Joseph 01/06/2019 12:30 PM Medical Record  [id number] Patient Account Number: [id number] Date of Birth/Sex: Treating RN: Nov 08, 1927 (85 y.o. Elam Dutch Primary Care Provider: Alden Server Other Clinician: Referring Provider: Treating Provider/Extender:Robson, Towana Badger, Rutherford Limerick in Treatment: 5 History of Present Illness HPI Description: 11/30/2018 patient presents today for initial evaluation in our clinic concerning issues that she is having with her left knee. She states that she does not really know of any specific injury that occurred and cause this ulcer. Nonetheless she does have a scratch on her leg from her dog but again she is not aware of her dog actually scratching her at this location. She does have a history of multiple knee replacements with the last being in 2006 I believe this was her third. She also has obviously a artificial knee joint on the left. She has hypertension, paroxysmal atrial fibrillation, congestive heart failure, and a right above-knee amputation. This was secondary to osteomyelitis. At this time patient is having no pain with regard to her knee at this point. Fortunately she is also having no signs of active infection at this time as far as I can see. 12/07/2018 upon evaluation today patient actually appears to be showing some signs of improvement which is good news. She still though having a little bit of granulation around the edge of the wound bed has not had as much of a improvement right in the base of the wound as I like to see this is still very dry. Nonetheless I believe that we may want to add hydrogel to the collagen in order to allow this to heal more appropriately. She is definitely in agreement with giving this a try. The good news is there does not appear to be any signs of infection. 9/17; patient have not seen previously. 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 She  has had skin grafting and also an artificial knee joint apparently from 2006 on the left. She has a small open area  on the left lateral part of her knee. The exact reason for this is unclear. We have been using silver collagen with some improvement initially but not since the last visit. She is unaware of any skin issues in this area before it open. She thinks she has had this for about 2 months 9/24 absolutely no change in the condition of this wound. Almost looks like a small crater although the patient was not aware of a skin condition in this area. We have been using silver collagen absolutely no change 10/2; pathology of the nodule that did open last time suggested damage scar tissue. Also suggested the possibility of a follicle or cyst. There was no malignancy. We have been using silver collagen Electronic Signature(s) Signed: 01/06/2019 6:19:43 PM By: Linton Ham MD Entered By: Linton Ham on 01/06/2019 13:11:31 -------------------------------------------------------------------------------- Physical Exam Details Patient Name: Date of Service: Kelsey Joseph, Kelsey Joseph 01/06/2019 12:30 PM Medical Record [id number] Patient Account Number: [id number] Date of Birth/Sex: Treating RN: 1928/03/12 (85 y.o. Elam Dutch Primary Care Provider: Other Clinician: Alden Server Referring Provider: Treating Provider/Extender:Robson, Towana Badger, Rutherford Limerick in Treatment: 5 Constitutional Sitting or standing Blood Pressure is within target range for patient.. Pulse regular and within target range for patient.Marland Kitchen Respirations regular, non-labored and within target range.. Temperature is normal and within the target range for the patient.Marland Kitchen Appears in no distress. Notes Wound exam; the area in question is on the left lateral lower knee just lateral to the patellar tendon. This is in the middle of extensive scar tissue from previous grafting. The surface area of the wound is now larger  although this was a nodule prior to this. I used a #3 curette to debride the surface hopefully we can get to a healthy surface that might except surface epithelium. There is no evidence of infection Electronic Signature(s) Signed: 01/06/2019 6:19:43 PM By: Linton Ham MD Entered By: Linton Ham on 01/06/2019 13:12:31 -------------------------------------------------------------------------------- Physician Orders Details Patient Name: Date of Service: Kelsey Joseph, Kelsey Joseph 01/06/2019 12:30 PM Medical Record [id number] Patient Account Number: [id number] Date of Birth/Sex: Treating RN: February 08, 1928 (85 y.o. Elam Dutch Primary Care Provider: Alden Server Other Clinician: Referring Provider: Treating Provider/Extender:Robson, Towana Badger, Rutherford Limerick in Treatment: 5 Verbal / Phone Orders: No Diagnosis Coding ICD-10 Coding Code Description S81.002A Unspecified open wound, left knee, initial encounter L97.822 Non-pressure chronic ulcer of other part of left lower leg with fat layer exposed Z96.652 Presence of left artificial knee joint I10 Essential (primary) hypertension I48.0 Paroxysmal atrial fibrillation I50.42 Chronic combined systolic (congestive) and diastolic (congestive) heart failure Z89.611 Acquired absence of right leg above knee Follow-up Appointments Return Appointment in 1 week. Dressing Change Frequency Wound #1 Left Knee Change Dressing every other day. Wound Cleansing Wound #1 Left Knee May shower and wash wound with soap and water. Primary Wound Dressing Wound #1 Left Knee Iodoflex Secondary Dressing Wound #1 Left Knee Foam Border - or large bordered gauze. Edema Control Elevate legs to the level of the heart or above for 30 minutes daily and/or when sitting, a frequency of: - throughout the day. Electronic Signature(s) Signed: 01/06/2019 6:19:43 PM By: Linton Ham MD Signed: 01/06/2019 7:05:19 PM By: Baruch Gouty RN, BSN Entered  By: Baruch Gouty on 01/06/2019 13:08:43 -------------------------------------------------------------------------------- Problem List Details Patient Name: Date of Service: Kelsey Joseph, Kelsey Joseph 01/06/2019 12:30 PM Medical Record [id number] Patient Account Number: [id number] Date of Birth/Sex: Treating RN: November 25, 1927 (85 y.o. Elam Dutch Primary Care Provider: Alden Server Other  Clinician: Referring Provider: Treating Provider/Extender:Robson, Towana Badger, Rutherford Limerick in Treatment: 5 Active Problems ICD-10 Evaluated Encounter Code Description Active Date Today Diagnosis S81.002A Unspecified open wound, left knee, initial encounter 11/30/2018 No Yes L97.822 Non-pressure chronic ulcer of other part of left lower 11/30/2018 No Yes leg with fat layer exposed Z96.652 Presence of left artificial knee joint 11/30/2018 No Yes I10 Essential (primary) hypertension 11/30/2018 No Yes I48.0 Paroxysmal atrial fibrillation 11/30/2018 No Yes I50.42 Chronic combined systolic (congestive) and diastolic [id number] No Yes (congestive) heart failure Z89.611 Acquired absence of right leg above knee 11/30/2018 No Yes Inactive Problems Resolved Problems Electronic Signature(s) Signed: 01/06/2019 6:19:43 PM By: Linton Ham MD Entered By: Linton Ham on 01/06/2019 13:10:25 -------------------------------------------------------------------------------- Progress Note Details Patient Name: Date of Service: Kelsey Joseph 01/06/2019 12:30 PM Medical Record [id number] Patient Account Number: [id number] Date of Birth/Sex: Treating RN: 08-26-1927 (85 y.o. Elam Dutch Primary Care Provider: Alden Server Other Clinician: Referring Provider: Treating Provider/Extender:Robson, Towana Badger, Rutherford Limerick in Treatment: 5 Subjective History of Present Illness (HPI) 11/30/2018 patient presents today for initial evaluation in our clinic concerning issues that she is having  with her left knee. She states that she does not really know of any specific injury that occurred and cause this ulcer. Nonetheless she does have a scratch on her leg from her dog but again she is not aware of her dog actually scratching her at this location. She does have a history of multiple knee replacements with the last being in 2006 I believe this was her third. She also has obviously a artificial knee joint on the left. She has hypertension, paroxysmal atrial fibrillation, congestive heart failure, and a right above-knee amputation. This was secondary to osteomyelitis. At this time patient is having no pain with regard to her knee at this point. Fortunately she is also having no signs of active infection at this time as far as I can see. 12/07/2018 upon evaluation today patient actually appears to be showing some signs of improvement which is good news. She still though having a little bit of granulation around the edge of the wound bed has not had as much of a improvement right in the base of the wound as I like to see this is still very dry. Nonetheless I believe that we may want to add hydrogel to the collagen in order to allow this to heal more appropriately. She is definitely in agreement with giving this a try. The good news is there does not appear to be any signs of infection. 9/17; patient have not seen previously. 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 She has had skin grafting and also an artificial knee joint apparently from 2006 on the left. She has a small open area on the left lateral part of her knee. The exact reason for this is unclear. We have been using silver collagen with some improvement initially but not since the last visit. She is unaware of any skin issues in this area before it open. She thinks she has had this for about 2 months 9/24 absolutely no change in the  condition of this wound. Almost looks like a small crater although the patient was not aware of a skin condition in this area. We have been using silver collagen absolutely no change 10/2; pathology of the nodule that did open last time suggested damage scar tissue. Also suggested the possibility of  a follicle or cyst. There was no malignancy. We have been using silver collagen Objective Constitutional Sitting or standing Blood Pressure is within target range for patient.. Pulse regular and within target range for patient.Marland Kitchen Respirations regular, non-labored and within target range.. Temperature is normal and within the target range for the patient.Marland Kitchen Appears in no distress. Vitals Time Taken: 12:49 PM, Height: 63 in, Weight: 122 lbs, BMI: 21.6, Temperature: 98.2 F, Pulse: 70 bpm, Respiratory Rate: 18 breaths/min, Blood Pressure: 119/62 mmHg, Capillary Blood Glucose: 118 mg/dl. General Notes: Wound exam; the area in question is on the left lateral lower knee just lateral to the patellar tendon. This is in the middle of extensive scar tissue from previous grafting. The surface area of the wound is now larger although this was a nodule prior to this. I used a #3 curette to debride the surface hopefully we can get to a healthy surface that might except surface epithelium. There is no evidence of infection Integumentary (Hair, Skin) Wound #1 status is Open. Original cause of wound was Gradually Appeared. The wound is located on the Left Knee. The wound measures 2.3cm length x 1.6cm width x 0.1cm depth; 2.89cm^2 area and 0.289cm^3 volume. There is Fat Layer (Subcutaneous Tissue) Exposed exposed. There is no tunneling or undermining noted. There is a medium amount of serosanguineous drainage noted. The wound margin is flat and intact. There is small (1-33%) pink granulation within the wound bed. There is a large (67-100%) amount of necrotic tissue within the wound bed including Adherent  Slough. 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 There was a Excisional Skin/Subcutaneous Tissue Debridement with a total area of 3.68 sq cm performed by Ricard Dillon., MD. With the following instrument(s): Curette to remove Viable and Non-Viable tissue/material. Material removed includes Subcutaneous Tissue and Slough and after achieving pain control using Lidocaine 5% topical ointment. No specimens were taken. A time out was conducted at 13:00, prior to the start of the procedure. A Minimum amount of bleeding was controlled with Pressure. The procedure was tolerated well with a pain level of 0 throughout and a pain level of 0 following the procedure. Post Debridement Measurements: 2.3cm length x 1.6cm width x 0.1cm depth; 0.289cm^3 volume. Character of Wound/Ulcer Post Debridement is improved. Severity of Tissue Post Debridement is: Fat layer exposed. Post procedure Diagnosis Wound #1: Same as Pre-Procedure Plan Follow-up Appointments: Return Appointment in 1 week. Dressing Change Frequency: Wound #1 Left Knee: Change Dressing every other day. Wound Cleansing: Wound #1 Left Knee: May shower and wash wound with soap and water. Primary Wound Dressing: Wound #1 Left Knee: Iodoflex Secondary Dressing: Wound #1 Left Knee: Foam Border - or large bordered gauze. Edema Control: Elevate legs to the level of the heart or above for 30 minutes daily and/or when sitting, a  frequency of: - throughout the day. 1. Left knee. Debridement with a #3 curette change the dressing to Iodoflex. 2. The pathology was negative there was no malignancy Electronic Signature(s) Signed: 01/06/2019 6:19:43 PM By: Linton Ham MD Entered By: Linton Ham on 01/06/2019 13:13:03 --------------------------------------------------------------------------------  SuperBill Details Patient Name: Date of Service: Kelsey Joseph, LOFT 01/06/2019 Medical Record [id number] Patient Account Number: [id number] Date of Birth/Sex: Treating RN: 1928/01/15 (85 y.o. Elam Dutch Primary Care Provider: Alden Server Other Clinician: Referring Provider: Treating Provider/Extender:Robson, Towana Badger, Rutherford Limerick in Treatment: 5 Diagnosis Coding ICD-10 Codes Code Description S81.002A Unspecified open wound, left knee, initial encounter L97.822 Non-pressure chronic ulcer of other part of left lower leg with fat layer exposed Z96.652 Presence of left artificial knee joint I10 Essential (primary) hypertension I48.0 Paroxysmal atrial fibrillation I50.42 Chronic combined systolic (congestive) and diastolic (congestive) heart failure Z89.611 Acquired absence of right leg above knee Facility Procedures The patient participates with Medicare or their insurance follows the Medicare Facility Guidelines: CPT4 Code Description Modifier Quantity [id number] - DEB SUBQ TISSUE 20 SQ CM/< 1 ICD-10 Diagnosis Description L97.822 Non-pressure chronic ulcer of  other part of left lower leg with fat layer exposed Physician Procedures CPT4 Code Description: [id number] - WC PHYS SUBQ TISS 20 SQ CM ICD-10 Diagnosis Description L97.822 Non-pressure chronic ulcer of other part of left lower leg w Modifier: ith fat laye Quantity: 1 r exposed Electronic Signature(s) Signed: 01/06/2019 6:19:43 PM By: Linton Ham MD Entered By: Linton Ham on 01/06/2019 13:13:24

## 2019-01-09 NOTE — Progress Notes (Signed)
Kelsey Joseph, Kelsey Joseph ([id number]) Visit Report for 01/06/2019 Arrival Information Details Patient Name: Date of Service: Kelsey Joseph, Kelsey Joseph 01/06/2019 12:30 PM Medical Record [id number] Patient Account Number: [id number] Date of Birth/Sex: Treating RN: 1927-05-01 (85 y.o. [name] Primary Care Chayne Baumgart: Alden Server Other Clinician: Referring Deyana Wnuk: Treating Kristy Catoe/Extender:Robson, Towana Badger, Rutherford [place] in Treatment: 5 Visit Information History Since Last Visit Added or deleted any medications: No Patient Arrived: Wheel Chair Any new allergies or adverse reactions: No Arrival Time: 12:48 Had a fall or experienced change in No activities of daily living that may affect Accompanied By: alone risk of falls: Transfer Assistance: None Signs or symptoms of abuse/neglect since last No Patient Identification Verified: Yes visito Secondary Verification Process Completed: Yes Hospitalized since last visit: No Patient Requires Transmission-Based No Implantable device outside of the clinic excluding No Precautions: cellular tissue based products placed in the center Patient Has Alerts: No since last visit: Has Dressing in Place as Prescribed: Yes Pain Present Now: No Electronic Signature(s) Signed: 01/09/2019 6:17:12 PM By: Levan Hurst RN, BSN Entered By: Levan Hurst on 01/06/2019 12:48:43 -------------------------------------------------------------------------------- Encounter Discharge Information Details Patient Name: Date of Service: Kelsey Joseph, Kelsey Joseph 01/06/2019 12:30 PM Medical Record [id number] Patient Account Number: [id number] Date of Birth/Sex: Treating RN: May 28, 1927 (85 y.o. Debby Bud Primary Care Tarvis Blossom: Alden Server Other Clinician: Referring Analysse Quinonez: Treating Justus Droke/Extender:Robson, Towana Badger, Rutherford [place] in Treatment: 5 Encounter Discharge Information Items Post Procedure Vitals Discharge Condition:  Stable Temperature (F): 98.2 Ambulatory Status: Wheelchair Pulse (bpm): 70 Discharge Destination: Home Respiratory Rate (breaths/min): 18 Transportation: Private Auto Blood Pressure (mmHg): 119/62 Accompanied By: self Schedule Follow-up Appointment: Yes Clinical Summary of Care: Electronic Signature(s) Signed: 01/06/2019 6:13:58 PM By: Deon Pilling Entered By: Deon Pilling on 01/06/2019 14:47:18 -------------------------------------------------------------------------------- Lower Extremity Assessment Details Patient Name: Date of Service: Kelsey Joseph, Kelsey Joseph 01/06/2019 12:30 PM Medical Record [id number] Patient Account Number: [id number] Date of Birth/Sex: Treating RN: [birth year] (85 y.o. [name] Primary Care Alekhya Gravlin: Alden Server Other Clinician: Referring Phylisha Dix: Treating Zavia Pullen/Extender:Robson, Towana Badger, Rutherford [place] in Treatment: 5 Edema Assessment Assessed: [Left: No] [Right: No] Edema: [Left: N] [Right: o] Calf Left: Right: Point of Measurement: 37 cm From Medial Instep 33.5 cm cm Ankle Left: Right: Point of Measurement: 10 cm From Medial Instep 21 cm cm Vascular Assessment Pulses: Dorsalis Pedis Palpable: [Left:No] Electronic Signature(s) Signed: 01/09/2019 6:17:12 PM By: Levan Hurst RN, BSN Entered By: Levan Hurst on 01/06/2019 12:51:08 -------------------------------------------------------------------------------- Multi Wound Chart Details Patient Name: Date of Service: Kelsey Joseph 01/06/2019 12:30 PM Medical Record [id number] Patient Account Number: [id number] Date of Birth/Sex: Treating RN: 06-Jul-1927 (85 y.o. Elam Dutch Primary Care Tesneem Dufrane: Alden Server Other Clinician: Referring Marigny Borre: Treating Galvin Aversa/Extender:Robson, Towana Badger, Rutherford [place] in Treatment: 5 Vital Signs Height(in): 63 Capillary Blood 118 Glucose(mg/dl): Weight(lbs): 122 Pulse(bpm): 70 Body Mass Index(BMI): 22 Blood  Pressure(mmHg): 119/62 Temperature(F): 98.2 Respiratory 18 Rate(breaths/min): Photos: [1:No Photos] [N/A:N/A] Wound Location: [1:Left Knee] [N/A:N/A] Wounding Event: [1:Gradually Appeared] [N/A:N/A] Primary Etiology: [1:Trauma, Other] [N/A:N/A] Secondary Etiology: [1:Diabetic Wound/Ulcer of the N/A Lower Extremity] Comorbid History: [1:Sleep Apnea, Congestive N/A Heart Failure, Coronary Artery Disease, Type II Diabetes, Osteomyelitis, Neuropathy] Date Acquired: [1:09/09/2018] [N/A:N/A] Weeks of Treatment: [1:5] [N/A:N/A] Wound Status: [1:Open] [N/A:N/A] Measurements L x W x D 2.3x1.6x0.1 [N/A:N/A] (cm) Area (cm) : [1:2.89] [N/A:N/A] Volume (cm) : [1:0.289] [N/A:N/A] % Reduction in Area: [1:-950.90%] [N/A:N/A] % Reduction in Volume: -425.50% [N/A:N/A] Classification: [1:Full Thickness Without Exposed Support Structures] [N/A:N/A] Exudate  Amount: [1:Medium] [N/A:N/A] Exudate Type: [1:Serosanguineous] [N/A:N/A] Exudate Color: [1:red, brown] [N/A:N/A] Wound Margin: [1:Flat and Intact] [N/A:N/A] Granulation Amount: [1:Small (1-33%)] [N/A:N/A] Granulation Quality: [1:Pink] [N/A:N/A] Necrotic Amount: [1:Large (67-100%)] [N/A:N/A] Exposed Structures: [1:Fat Layer (Subcutaneous N/A Tissue) Exposed: Yes Fascia: No Tendon: No Muscle: No Joint: No Bone: No] Epithelialization: [1:None] [N/A:N/A] Debridement: [1:Debridement - Excisional N/A] Pre-procedure [1:13:00] [N/A:N/A] Verification/Time Out Taken: Pain Control: [1:Lidocaine 5% topical ointment] [N/A:N/A] Tissue Debrided: [1:Subcutaneous, Slough] [N/A:N/A] Level: [1:Skin/Subcutaneous Tissue N/A] Debridement Area (sq cm):3.68 [N/A:N/A] Instrument: [1:Curette] [N/A:N/A] Bleeding: [1:Minimum] [N/A:N/A] Hemostasis Achieved: [1:Pressure] [N/A:N/A] Procedural Pain: [1:0] [N/A:N/A] Post Procedural Pain: [1:0] [N/A:N/A] Debridement Treatment [1:Procedure was tolerated] [N/A:N/A] Response: [1:well] Post Debridement [1:2.3x1.6x0.1]  [N/A:N/A] Measurements L x W x D (cm) Post Debridement [1:0.289] [N/A:N/A] Volume: (cm) Procedures Performed: [1:Debridement] [N/A:N/A] Treatment Notes Electronic Signature(s) Signed: 01/06/2019 6:19:43 PM By: Linton Ham MD Signed: 01/06/2019 7:05:19 PM By: Baruch Gouty RN, BSN Entered By: Linton Ham on 01/06/2019 13:10:38 -------------------------------------------------------------------------------- Multi-Disciplinary Care Plan Details Patient Name: Date of Service: Kelsey Joseph, Kelsey Joseph 01/06/2019 12:30 PM Medical Record [id number] Patient Account Number: [id number] Date of Birth/Sex: Treating RN: 1927/09/27 (85 y.o. Elam Dutch Primary Care Menna Abeln: Alden Server Other Clinician: Referring Cheril Slattery: Treating Lexianna Weinrich/Extender:Robson, Towana Badger, Rutherford [place] in Treatment: 5 Active Inactive Wound/Skin Impairment Nursing Diagnoses: Impaired tissue integrity Knowledge deficit related to ulceration/compromised skin integrity Goals: Patient/caregiver will verbalize understanding of skin care regimen Date Initiated: 11/30/2018 Date Inactivated: 12/22/2018 Target Resolution Date: 12/28/2018 Goal Status: Met Ulcer/skin breakdown will have a volume reduction of 30% by week 4 Date Initiated: 11/30/2018 Date Inactivated: 01/06/2019 Target Resolution Date: 12/29/2018 Unmet Reason: debrided Goal Status: Unmet undermining Ulcer/skin breakdown will have a volume reduction of 50% by week 8 Date Initiated: 01/06/2019 Target Resolution Date: 01/27/2019 Goal Status: Active Interventions: Assess patient/caregiver ability to obtain necessary supplies Assess patient/caregiver ability to perform ulcer/skin care regimen upon admission and as needed Assess ulceration(s) every visit Provide education on ulcer and skin care Treatment Activities: Skin care regimen initiated : 11/30/2018 Topical wound management initiated : 11/30/2018 Notes: Electronic  Signature(s) Signed: 01/06/2019 7:05:19 PM By: Baruch Gouty RN, BSN Entered By: Baruch Gouty on 01/06/2019 13:04:48 -------------------------------------------------------------------------------- Pain Assessment Details Patient Name: Date of Service: Kelsey Joseph, Kelsey Joseph 01/06/2019 12:30 PM Medical Record [id number] Patient Account Number: [id number] Date of Birth/Sex: Treating RN: October 14, 1927 (85 y.o. [name] Primary Care Elgie Maziarz: Alden Server Other Clinician: Referring Paidyn Mcferran: Treating Marquette Blodgett/Extender:Robson, Towana Badger, Rutherford [place] in Treatment: 5 Active Problems Location of Pain Severity and Description of Pain Patient Has Paino No Site Locations Pain Management and Medication Current Pain Management: Electronic Signature(s) Signed: 01/09/2019 6:17:12 PM By: Levan Hurst RN, BSN Entered By: Levan Hurst on 01/06/2019 12:49:05 -------------------------------------------------------------------------------- Patient/Caregiver Education Details Patient Name: Date of Service: Kelsey Joseph 10/2/2020andnbsp12:30 PM Medical Record [id number] Patient Account Number: [id number] Date of Birth/Gender: Treating RN: 1928-02-26 (85 y.o. Elam Dutch Primary Care Physician: Alden Server Other Clinician: Referring Physician: Treating Physician/Extender:Robson, Towana Badger, Rutherford Limerick in Treatment: 5 Education Assessment Education Provided To: Patient Education Topics Provided Wound/Skin Impairment: Methods: Explain/Verbal Responses: Reinforcements needed, State content correctly Electronic Signature(s) Signed: 01/06/2019 7:05:19 PM By: Baruch Gouty RN, BSN Entered By: Baruch Gouty on 01/06/2019 13:05:34 -------------------------------------------------------------------------------- Wound Assessment Details Patient Name: Date of Service: Kelsey Joseph, Kelsey Joseph 01/06/2019 12:30 PM Medical Record [id number] Patient  Account Number: [id number] Date of Birth/Sex: Treating RN: 08/12/1927 (85 y.o. [name] Primary Care Mily Malecki: Alden Server Other Clinician: Referring Peder Allums: Treating  Marilyne Haseley/Extender:Robson, Towana Badger, Lynnell Chad Weeks in Treatment: 5 Wound Status Wound Number: 1 Primary Trauma, Other Etiology: Wound Location: Left Knee Secondary Diabetic Wound/Ulcer of the Lower Extremity Wounding Event: Gradually Appeared Etiology: Date Acquired: 09/09/2018 Wound Open Weeks Of Treatment: 5 Status: Clustered Wound: No Comorbid Sleep Apnea, Congestive Heart Failure, History: Coronary Artery Disease, Type II Diabetes, Osteomyelitis, Neuropathy Photos Wound Measurements Length: (cm) 2.3 % Reduc Width: (cm) 1.6 % Reduc Depth: (cm) 0.1 Epithel Area: (cm) 2.89 Tunnel Volume: (cm) 0.289 Underm Wound Description Classification: Full Thickness Without Exposed Support Foul O Structures Slough Wound Flat and Intact Margin: Exudate Medium Amount: Exudate Serosanguineous Type: Exudate red, brown Color: Wound Bed Granulation Amount: Small (1-33%) Granulation Quality: Pink Fascia Necrotic Amount: Large (67-100%) Fat Lay Necrotic Quality: Adherent Slough Tendon Muscle Joint E Bone Ex dor After Cleansing: No /Fibrino Yes Exposed Structure Exposed: No er (Subcutaneous Tissue) Exposed: Yes Exposed: No Exposed: No xposed: No posed: No tion in Area: -950.9% tion in Volume: -425.5% ialization: None ing: No ining: No Treatment Notes Wound #1 (Left Knee) 1. Cleanse With Wound Cleanser 2. Periwound Care Skin Prep 3. Primary Dressing Applied Iodoflex 4. Secondary Dressing Foam Border Dressing 5. Secured With Self Adhesive Bandage Notes explained how to use the primary dressing and how it works. patient in agreement. Electronic Signature(s) Signed: 01/09/2019 4:14:10 PM By: Mikeal Hawthorne EMT/HBOT Signed: 01/09/2019 6:17:12 PM By: Levan Hurst RN, BSN Entered  By: Mikeal Hawthorne on 01/09/2019 09:29:16 -------------------------------------------------------------------------------- [place] Details Patient Name: Date of Service: Kelsey Joseph, Kelsey Joseph 01/06/2019 12:30 PM Medical Record [id number] Patient Account Number: [id number] Date of Birth/Sex: Treating RN: Jan 03, 1928 (85 y.o. [name] Primary Care Sherah Lund: Alden Server Other Clinician: Referring Rowe Warman: Treating Leeloo Silverthorne/Extender:Robson, Towana Badger, Rutherford [place] in Treatment: 5 Vital Signs Time Taken: 12:49 Temperature (F): 98.2 Height (in): 63 Pulse (bpm): 70 Weight (lbs): 122 Respiratory Rate (breaths/min): 18 Body Mass Index (BMI): 21.6 Blood Pressure (mmHg): 119/62 Capillary Blood Glucose (mg/dl): 118 Reference Range: 80 - 120 mg / dl Electronic Signature(s) Signed: 01/09/2019 6:17:12 PM By: Levan Hurst RN, BSN Entered By: Levan Hurst on 01/06/2019 12:51:00

## 2019-01-13 ENCOUNTER — Encounter (HOSPITAL_BASED_OUTPATIENT_CLINIC_OR_DEPARTMENT_OTHER): Payer: Medicare Other | Attending: Internal Medicine | Admitting: Internal Medicine

## 2019-01-13 ENCOUNTER — Other Ambulatory Visit: Payer: Self-pay

## 2019-01-13 DIAGNOSIS — I480 Paroxysmal atrial fibrillation: Secondary | ICD-10-CM | POA: Insufficient documentation

## 2019-01-13 DIAGNOSIS — Z96652 Presence of left artificial knee joint: Secondary | ICD-10-CM | POA: Insufficient documentation

## 2019-01-13 DIAGNOSIS — E1140 Type 2 diabetes mellitus with diabetic neuropathy, unspecified: Secondary | ICD-10-CM | POA: Diagnosis not present

## 2019-01-13 DIAGNOSIS — I2510 Atherosclerotic heart disease of native coronary artery without angina pectoris: Secondary | ICD-10-CM | POA: Insufficient documentation

## 2019-01-13 DIAGNOSIS — I110 Hypertensive heart disease with heart failure: Secondary | ICD-10-CM | POA: Diagnosis not present

## 2019-01-13 DIAGNOSIS — G4730 Sleep apnea, unspecified: Secondary | ICD-10-CM | POA: Insufficient documentation

## 2019-01-13 DIAGNOSIS — L97822 Non-pressure chronic ulcer of other part of left lower leg with fat layer exposed: Secondary | ICD-10-CM | POA: Diagnosis not present

## 2019-01-13 DIAGNOSIS — Z89511 Acquired absence of right leg below knee: Secondary | ICD-10-CM | POA: Diagnosis not present

## 2019-01-13 DIAGNOSIS — E11622 Type 2 diabetes mellitus with other skin ulcer: Secondary | ICD-10-CM | POA: Insufficient documentation

## 2019-01-13 DIAGNOSIS — I5042 Chronic combined systolic (congestive) and diastolic (congestive) heart failure: Secondary | ICD-10-CM | POA: Diagnosis not present

## 2019-01-17 NOTE — Progress Notes (Signed)
KAJAH, ARLT ([id number]) Visit Report for 01/13/2019 Debridement Details Patient Name: Date of Service: Kelsey Joseph, Kelsey Joseph 01/13/2019 12:45 PM Medical Record [id number] Patient Account Number: [id number] Date of Birth/Sex: Treating RN: [birth year] (85 y.o. [name] Primary Care Provider: Alden Server Other Clinician: Referring Provider: Treating Provider/Extender:Robson, Towana Badger, Rutherford Limerick in Treatment: 6 Debridement Performed for Wound #1 Left Knee Assessment: Performed By: Physician Ricard Dillon., MD Debridement Type: Debridement Severity of Tissue Pre Fat layer exposed Debridement: Level of Consciousness (Pre- Awake and Alert procedure): Pre-procedure Verification/Time Out Taken: Yes - 13:23 Start Time: 13:23 Total Area Debrided (L x W): 2.1 (cm) x 1.5 (cm) = 3.15 (cm) Tissue and other material Viable, Non-Viable, Slough, Subcutaneous, Slough debrided: Level: Skin/Subcutaneous Tissue Debridement Description: Excisional Instrument: Curette Bleeding: Moderate Hemostasis Achieved: Silver Nitrate End Time: 13:24 Procedural Pain: 0 Post Procedural Pain: 0 Response to Treatment: Procedure was tolerated well Level of Consciousness Awake and Alert (Post-procedure): Post Debridement Measurements of Total Wound Length: (cm) 2.1 Width: (cm) 1.5 Depth: (cm) 0.1 Volume: (cm) 0.247 Character of Wound/Ulcer Post Requires Further Debridement Debridement: Severity of Tissue Post Debridement: Fat layer exposed Post Procedure Diagnosis Same as Pre-procedure Electronic Signature(s) Signed: 01/15/2019 10:27:00 AM By: Linton Ham MD Signed: 01/17/2019 5:52:09 PM By: Levan Hurst RN, BSN Entered By: Levan Hurst on 01/13/2019 13:26:47 -------------------------------------------------------------------------------- HPI Details Patient Name: Date of Service: Kelsey Joseph, Kelsey Joseph 01/13/2019 12:45 PM Medical Record [id number] Patient  Account Number: [id number] Date of Birth/Sex: Treating RN: 02/23/1928 (85 y.o. [name] Primary Care Provider: Alden Server Other Clinician: Referring Provider: Treating Provider/Extender:Robson, Towana Badger, Rutherford Limerick in Treatment: 6 History of Present Illness HPI Description: 11/30/2018 patient presents today for initial evaluation in our clinic concerning issues that she is having with her left knee. She states that she does not really know of any specific injury that occurred and cause this ulcer. Nonetheless she does have a scratch on her leg from her dog but again she is not aware of her dog actually scratching her at this location. She does have a history of multiple knee replacements with the last being in 2006 I believe this was her third. She also has obviously a artificial knee joint on the left. She has hypertension, paroxysmal atrial fibrillation, congestive heart failure, and a right above-knee amputation. This was secondary to osteomyelitis. At this time patient is having no pain with regard to her knee at this point. Fortunately she is also having no signs of active infection at this time as far as I can see. 12/07/2018 upon evaluation today patient actually appears to be showing some signs of improvement which is good news. She still though having a little bit of granulation around the edge of the wound bed has not had as much of a improvement right in the base of the wound as I like to see this is still very dry. Nonetheless I believe that we may want to add hydrogel to the collagen in order to allow this to heal more appropriately. She is definitely in agreement with giving this a try. The good news is there does not appear to be any signs of infection. 9/17; patient have not seen previously. 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 She has had skin grafting and  also an artificial knee joint apparently from 2006 on the left. She has a small open area on the left  lateral part of her knee. The exact reason for this is unclear. We have been using silver collagen with some improvement initially but not since the last visit. She is unaware of any skin issues in this area before it open. She thinks she has had this for about 2 months 9/24 absolutely no change in the condition of this wound. Almost looks like a small crater although the patient was not aware of a skin condition in this area. We have been using silver collagen absolutely no change 10/2; pathology of the nodule that did open last time suggested damage scar tissue. Also suggested the possibility of a follicle or cyst. There was no malignancy. We have been using silver collagen 10/9; wound surface is slightly smaller but no suggestion of viable tissue. Still requiring debridement we are using Iodoflex Electronic Signature(s) Signed: 01/15/2019 10:27:00 AM By: Linton Ham MD Entered By: Linton Ham on 01/13/2019 13:30:08 -------------------------------------------------------------------------------- Physical Exam Details Patient Name: Date of Service: Kelsey Joseph, Kelsey Joseph 01/13/2019 12:45 PM Medical Record [id number] Patient Account Number: [id number] Date of Birth/Sex: Treating RN: 1928/02/13 (85 y.o. [name] Primary Care Provider: Alden Server Other Clinician: Referring Provider: Treating Provider/Extender:Robson, Towana Badger, Rutherford Limerick in Treatment: 6 Constitutional Sitting or standing Blood Pressure is within target range for patient.. Pulse regular and within target range for patient.Marland Kitchen Respirations regular, non-labored and within target range.. Temperature is normal and within the target range for the patient.Marland Kitchen Appears in no distress. Notes The area in question is on the left lower lateral knee just lateral to the patellar tendon. This is in the middle  of extensive scar tissue from previous grafting. Wound is no bigger than last week measuring slightly smaller. Circular wound completely nonviable surface. Debrided with a #5 curette. I am not able to get to a healthy wound surface as of yet. No evidence of infection Electronic Signature(s) Signed: 01/15/2019 10:27:00 AM By: Linton Ham MD Entered By: Linton Ham on 01/13/2019 13:31:22 -------------------------------------------------------------------------------- Physician Orders Details Patient Name: Date of Service: Kelsey Joseph, Kelsey Joseph 01/13/2019 12:45 PM Medical Record [id number] Patient Account Number: [id number] Date of Birth/Sex: Treating RN: [birth year] (85 y.o. [name] Primary Care Provider: Alden Server Other Clinician: Referring Provider: Treating Provider/Extender:Robson, Towana Badger, Rutherford Limerick in Treatment: 6 Verbal / Phone Orders: No Diagnosis Coding ICD-10 Coding Code Description S81.002A Unspecified open wound, left knee, initial encounter L97.822 Non-pressure chronic ulcer of other part of left lower leg with fat layer exposed Z96.652 Presence of left artificial knee joint I10 Essential (primary) hypertension I48.0 Paroxysmal atrial fibrillation I50.42 Chronic combined systolic (congestive) and diastolic (congestive) heart failure Z89.611 Acquired absence of right leg above knee Follow-up Appointments Return Appointment in 2 weeks. Dressing Change Frequency Wound #1 Left Knee Change Dressing every other day. Wound Cleansing Wound #1 Left Knee May shower and wash wound with soap and water. Primary Wound Dressing Wound #1 Left Knee Iodoflex Secondary Dressing Wound #1 Left Knee Foam Border - or large bordered gauze. Edema Control Elevate legs to the level of the heart or above for 30 minutes daily and/or when sitting, a frequency of: - throughout the day. Electronic Signature(s) Signed: 01/15/2019 10:27:00 AM By: Linton Ham MD Signed: 01/17/2019 5:52:09 PM By: Levan Hurst RN, BSN Entered By: Levan Hurst on 01/13/2019 13:28:28 -------------------------------------------------------------------------------- Problem List Details Patient Name: Date of Service: Kelsey Joseph, Kelsey Joseph 01/13/2019 12:45 PM Medical Record [id number] Patient Account Number: [id number] Date of Birth/Sex: Treating RN: [birth year] (85 y.o. Benjamine Sprague, Briant Cedar  Primary Care Provider: Alden Server Other Clinician: Referring Provider: Treating Provider/Extender:Robson, Towana Badger, Rutherford Limerick in Treatment: 6 Active Problems ICD-10 Evaluated Encounter Code Description Active Date Today Diagnosis S81.002A Unspecified open wound, left knee, initial encounter 11/30/2018 No Yes L97.822 Non-pressure chronic ulcer of other part of left lower 11/30/2018 No Yes leg with fat layer exposed Z96.652 Presence of left artificial knee joint 11/30/2018 No Yes I10 Essential (primary) hypertension 11/30/2018 No Yes I48.0 Paroxysmal atrial fibrillation 11/30/2018 No Yes I50.42 Chronic combined systolic (congestive) and diastolic [id number] No Yes (congestive) heart failure Z89.611 Acquired absence of right leg above knee 11/30/2018 No Yes Inactive Problems Resolved Problems Electronic Signature(s) Signed: 01/15/2019 10:27:00 AM By: Linton Ham MD Entered By: Linton Ham on 01/13/2019 13:28:37 -------------------------------------------------------------------------------- Progress Note Details Patient Name: Date of Service: Kelsey Joseph 01/13/2019 12:45 PM Medical Record [id number] Patient Account Number: [id number] Date of Birth/Sex: Treating RN: 11-01-1927 (85 y.o. [name] Primary Care Provider: Alden Server Other Clinician: Referring Provider: Treating Provider/Extender:Robson, Towana Badger, Rutherford Limerick in Treatment: 6 Subjective History of Present Illness (HPI) 11/30/2018 patient presents today  for initial evaluation in our clinic concerning issues that she is having with her left knee. She states that she does not really know of any specific injury that occurred and cause this ulcer. Nonetheless she does have a scratch on her leg from her dog but again she is not aware of her dog actually scratching her at this location. She does have a history of multiple knee replacements with the last being in 2006 I believe this was her third. She also has obviously a artificial knee joint on the left. She has hypertension, paroxysmal atrial fibrillation, congestive heart failure, and a right above-knee amputation. This was secondary to osteomyelitis. At this time patient is having no pain with regard to her knee at this point. Fortunately she is also having no signs of active infection at this time as far as I can see. 12/07/2018 upon evaluation today patient actually appears to be showing some signs of improvement which is good news. She still though having a little bit of granulation around the edge of the wound bed has not had as much of a improvement right in the base of the wound as I like to see this is still very dry. Nonetheless I believe that we may want to add hydrogel to the collagen in order to allow this to heal more appropriately. She is definitely in agreement with giving this a try. The good news is there does not appear to be any signs of infection. 9/17; patient have not seen previously. 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 She has had skin grafting and also an artificial knee joint apparently from 2006 on the left. She has a small open area on the left lateral part of her knee. The exact reason for this is unclear. We have been using silver collagen with some improvement initially but not since the last visit. She is unaware of any skin issues in this area before it open. She  thinks she has had this for about 2 months 9/24 absolutely no change in the condition of this wound. Almost looks like a small crater although the patient was not aware of a skin condition in this area. We have been using silver collagen absolutely no change 10/2; pathology of the nodule that did open last time suggested damage  scar tissue. Also suggested the possibility of a follicle or cyst. There was no malignancy. We have been using silver collagen 10/9; wound surface is slightly smaller but no suggestion of viable tissue. Still requiring debridement we are using Iodoflex Objective Constitutional Sitting or standing Blood Pressure is within target range for patient.. Pulse regular and within target range for patient.Marland Kitchen Respirations regular, non-labored and within target range.. Temperature is normal and within the target range for the patient.Marland Kitchen Appears in no distress. Vitals Time Taken: 1:10 PM, Height: 63 in, Weight: 122 lbs, BMI: 21.6, Temperature: 98.1 F, Pulse: 97 bpm, Respiratory Rate: 18 breaths/min, Blood Pressure: 117/81 mmHg. General Notes: The area in question is on the left lower lateral knee just lateral to the patellar tendon. This is in the middle of extensive scar tissue from previous grafting. Wound is no bigger than last week measuring slightly smaller. Circular wound completely nonviable surface. Debrided with a #5 curette. I am not able to get to a healthy wound surface as of yet. No evidence of infection Integumentary (Hair, Skin) Wound #1 status is Open. Original cause of wound was Gradually Appeared. The wound is located on the Left Knee. The wound measures 2.1cm length x 1.5cm width x 0.1cm depth; 2.474cm^2 area and 0.247cm^3 volume. There is Fat Layer (Subcutaneous Tissue) Exposed exposed. There is no tunneling or undermining noted. There is a medium amount of serosanguineous drainage noted. The wound margin is flat and intact. There is small (1-33%) pink  granulation within the wound bed. There is a large (67-100%) amount of necrotic tissue within the wound bed including Adherent Slough. 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 There was a Excisional Skin/Subcutaneous Tissue Debridement with a total area of 3.15 sq cm performed by Ricard Dillon., MD. With the following instrument(s): Curette to remove Viable and Non-Viable tissue/material. Material removed includes Subcutaneous Tissue and Slough and. No specimens were taken. A time out was conducted at 13:23, prior to the start of the procedure. A Moderate amount of bleeding was controlled with Silver Nitrate. The procedure was tolerated well with a pain level of 0 throughout and a pain level of 0 following the procedure. Post Debridement Measurements: 2.1cm length x 1.5cm width x 0.1cm depth; 0.247cm^3 volume. Character of Wound/Ulcer Post Debridement requires further debridement. Severity of Tissue Post Debridement is: Fat layer exposed. Post procedure Diagnosis Wound #1: Same as Pre-Procedure Plan Follow-up Appointments: Return Appointment in 2 weeks. Dressing Change Frequency: Wound #1 Left Knee: Change Dressing every other day. Wound Cleansing: Wound #1 Left Knee: May shower and wash wound with soap and water. Primary Wound Dressing: Wound #1 Left Knee: Iodoflex Secondary Dressing: Wound #1 Left Knee: Foam Border - or large bordered gauze. Edema Control: Elevate legs  to the level of the heart or above for 30 minutes daily and/or when sitting, a frequency of: - throughout the day. 1. Continue with Iodoflex border foam 2. Santyl change daily would be an option. The patient is changing her dressing daily Electronic Signature(s) Signed: 01/15/2019 10:27:00 AM By: Linton Ham MD Entered By: Linton Ham  on 01/13/2019 13:31:55 -------------------------------------------------------------------------------- SuperBill Details Patient Name: Date of Service: Kelsey Joseph, Kelsey Joseph 01/13/2019 Medical Record [id number] Patient Account Number: [id number] Date of Birth/Sex: Treating RN: 06-May-1927 (85 y.o. [name] Primary Care Provider: Alden Server Other Clinician: Referring Provider: Treating Provider/Extender:Robson, Towana Badger, Rutherford Limerick in Treatment: 6 Diagnosis Coding ICD-10 Codes Code Description S81.002A Unspecified open wound, left knee, initial encounter L97.822 Non-pressure chronic ulcer of other part of left lower leg with fat layer exposed Z96.652 Presence of left artificial knee joint I10 Essential (primary) hypertension I48.0 Paroxysmal atrial fibrillation I50.42 Chronic combined systolic (congestive) and diastolic (congestive) heart failure Z89.611 Acquired absence of right leg above knee Facility Procedures The patient participates with Medicare or their insurance follows the Medicare Facility Guidelines: CPT4 Code Description Modifier Quantity [id number] - DEB SUBQ TISSUE 20 SQ CM/< 1 ICD-10 Diagnosis Description L97.822 Non-pressure chronic ulcer of  other part of left lower leg with fat layer exposed Physician Procedures CPT4 Code Description: [id number] [id number] - WC PHYS SUBQ TISS 20 SQ CM ICD-10 Diagnosis Description L97.822 Non-pressure chronic ulcer of other part of left lower leg w Modifier: ith fat laye Quantity: 1 r exposed Electronic Signature(s) Signed: 01/15/2019 10:27:00 AM By: Linton Ham  MD Entered By: Linton Ham on 01/13/2019 13:32:18

## 2019-01-18 ENCOUNTER — Encounter: Payer: Self-pay | Admitting: *Deleted

## 2019-01-19 ENCOUNTER — Telehealth: Payer: Self-pay | Admitting: Neurology

## 2019-01-19 ENCOUNTER — Ambulatory Visit (INDEPENDENT_AMBULATORY_CARE_PROVIDER_SITE_OTHER): Payer: Medicare Other | Admitting: Neurology

## 2019-01-19 ENCOUNTER — Encounter: Payer: Self-pay | Admitting: Neurology

## 2019-01-19 ENCOUNTER — Other Ambulatory Visit: Payer: Self-pay

## 2019-01-19 VITALS — BP 105/60 | HR 62 | Temp 97.9°F

## 2019-01-19 DIAGNOSIS — R202 Paresthesia of skin: Secondary | ICD-10-CM | POA: Diagnosis not present

## 2019-01-19 DIAGNOSIS — R159 Full incontinence of feces: Secondary | ICD-10-CM

## 2019-01-19 DIAGNOSIS — I25118 Atherosclerotic heart disease of native coronary artery with other forms of angina pectoris: Secondary | ICD-10-CM | POA: Diagnosis not present

## 2019-01-19 NOTE — Telephone Encounter (Signed)
Medicare/apwu order sent to [facility]. No auth they will reach out to the patient to schedule.
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
## 2019-01-23 ENCOUNTER — Encounter: Payer: Self-pay | Admitting: Podiatry

## 2019-01-23 ENCOUNTER — Ambulatory Visit (INDEPENDENT_AMBULATORY_CARE_PROVIDER_SITE_OTHER): Payer: Medicare Other | Admitting: Podiatry

## 2019-01-23 ENCOUNTER — Ambulatory Visit: Payer: Medicare Other

## 2019-01-23 ENCOUNTER — Telehealth: Payer: Self-pay | Admitting: *Deleted

## 2019-01-23 ENCOUNTER — Other Ambulatory Visit: Payer: Self-pay

## 2019-01-23 VITALS — BP 127/74 | HR 91 | Resp 18

## 2019-01-23 DIAGNOSIS — B351 Tinea unguium: Secondary | ICD-10-CM | POA: Diagnosis not present

## 2019-01-23 DIAGNOSIS — I739 Peripheral vascular disease, unspecified: Secondary | ICD-10-CM | POA: Diagnosis not present

## 2019-01-23 DIAGNOSIS — Z89621 Acquired absence of right hip joint: Secondary | ICD-10-CM

## 2019-01-23 DIAGNOSIS — G5793 Unspecified mononeuropathy of bilateral lower limbs: Secondary | ICD-10-CM

## 2019-01-23 DIAGNOSIS — I25118 Atherosclerotic heart disease of native coronary artery with other forms of angina pectoris: Secondary | ICD-10-CM | POA: Diagnosis not present

## 2019-01-23 DIAGNOSIS — M79675 Pain in left toe(s): Secondary | ICD-10-CM

## 2019-01-23 DIAGNOSIS — M79674 Pain in right toe(s): Secondary | ICD-10-CM | POA: Diagnosis not present

## 2019-01-23 NOTE — Telephone Encounter (Signed)
-----   Message from Trula Slade, DPM sent at 01/23/2019  2:59 PM EDT ----- Tivis Ringer- can you call Dr. Pennie Banter office to see if she has ever been referred to vascular? She has decreased pulses. Per the neurology note I see she had arterial studies but I don't see them. If she has not been seen by vascular I would like her to be referred.

## 2019-01-24 NOTE — Telephone Encounter (Signed)
I called Dr. Pennie Banter office - Renee states nurse will call with the Vascular Surgeon information.

## 2019-01-24 NOTE — Telephone Encounter (Signed)
I don't either. In the last neurology note they did mention arterial studies and the results but I don't see them. Can we see if the PCP has them? If not I would like to refer her to vascular. Thanks.

## 2019-01-24 NOTE — Addendum Note (Signed)
Addended by: Marcial Pacas on: 01/24/2019 04:40 PM   Modules accepted: Orders
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
## 2019-01-24 NOTE — Telephone Encounter (Signed)
I have cancelled MRI, switch to CT lumbar

## 2019-01-24 NOTE — Telephone Encounter (Signed)
Patient has a pacemaker and cant have the MRI. Do you want to switch the order to a CT?

## 2019-01-25 NOTE — Telephone Encounter (Signed)
Dr. Pennie Banter receptionist - Elmyra Ricks transferred to referral coordinator Fraser Din and Lynelle Smoke to see if they had referred to a vascular surgeon. Left message for referral coordinator to call with information.

## 2019-01-25 NOTE — Telephone Encounter (Signed)
Order sent to GI. They will reach out to the patient to schedule.

## 2019-01-25 NOTE — Telephone Encounter (Signed)
Pt states she doesn't know if she has been referred to a vascular doctor.

## 2019-01-26 ENCOUNTER — Telehealth (HOSPITAL_COMMUNITY): Payer: Self-pay | Admitting: *Deleted

## 2019-01-26 NOTE — Telephone Encounter (Signed)
Referral and order forms with clinicals and demographics faxed to [facility].

## 2019-01-26 NOTE — Telephone Encounter (Signed)
Patient will return my call once she determines if testing is still warranted.

## 2019-01-26 NOTE — Telephone Encounter (Signed)
Incoming fax from [facility] states pt did not have the test with their facility.

## 2019-01-26 NOTE — Telephone Encounter (Signed)
Can you order arterial duplex and referral to vascular? Thanks.

## 2019-01-26 NOTE — Telephone Encounter (Signed)
Faxed request to [facility] for the circulation test.

## 2019-01-26 NOTE — Telephone Encounter (Signed)
Left message for [facility] - Cathy Comfort informing of fax from [facility] stating pt has not have testing with their group.

## 2019-01-26 NOTE — Telephone Encounter (Signed)
[facility] - [name] states she called pt to schedule and pt states she just had testing at [facility] ordered by Dr. Shelia Media, and she was looking at having anymore surgeries. Cathy and I reviewed testing and could not find circulatory testing results, but did find a referral from Dr. Pennie Banter office for PVD evaluation to [facility]. [name] states our referral to [facility] had a note not to schedule.

## 2019-01-26 NOTE — Telephone Encounter (Signed)
Pt states she had these exams done recently at another location.  I notified Dr. Leigh Aurora nurse Mateo Flow.

## 2019-01-26 NOTE — Telephone Encounter (Signed)
[facility] - [name] states she she reviewed the referral from Dr. Pennie Banter office and pt did have vascular studies. I asked for a copy and she states I would need to call [phone] and ask for [facility]. Left message with [facility] answering service for Kelsey Joseph to call me tomorrow.

## 2019-01-27 ENCOUNTER — Other Ambulatory Visit (HOSPITAL_BASED_OUTPATIENT_CLINIC_OR_DEPARTMENT_OTHER): Payer: Self-pay | Admitting: Internal Medicine

## 2019-01-27 ENCOUNTER — Encounter (HOSPITAL_BASED_OUTPATIENT_CLINIC_OR_DEPARTMENT_OTHER): Payer: Medicare Other | Admitting: Internal Medicine

## 2019-01-27 ENCOUNTER — Other Ambulatory Visit: Payer: Self-pay

## 2019-01-27 DIAGNOSIS — I110 Hypertensive heart disease with heart failure: Secondary | ICD-10-CM | POA: Diagnosis not present

## 2019-01-27 DIAGNOSIS — I2510 Atherosclerotic heart disease of native coronary artery without angina pectoris: Secondary | ICD-10-CM | POA: Diagnosis not present

## 2019-01-27 DIAGNOSIS — E11622 Type 2 diabetes mellitus with other skin ulcer: Secondary | ICD-10-CM | POA: Diagnosis not present

## 2019-01-27 DIAGNOSIS — G4730 Sleep apnea, unspecified: Secondary | ICD-10-CM | POA: Diagnosis not present

## 2019-01-27 DIAGNOSIS — E1140 Type 2 diabetes mellitus with diabetic neuropathy, unspecified: Secondary | ICD-10-CM | POA: Diagnosis not present

## 2019-01-27 DIAGNOSIS — L97822 Non-pressure chronic ulcer of other part of left lower leg with fat layer exposed: Secondary | ICD-10-CM | POA: Diagnosis not present

## 2019-01-27 NOTE — Progress Notes (Addendum)
Kelsey Joseph ([id number]) Visit Report for 01/27/2019 Biopsy Details Patient Name: Date of Service: Kelsey Joseph, Kelsey Joseph 01/27/2019 12:45 PM Medical Record [id number] Patient Account Number: [id number] Date of Birth/Sex: Treating RN: Chardae 21, 1929 (85 y.o. [name] Primary Care Provider: Alden Server Other Clinician: Referring Provider: Treating Provider/Extender:Robson, Towana Badger, Rutherford Limerick in Treatment: 8 Biopsy Performed for: Wound #1 Left Knee Location(s): Wound Margin Performed By: Physician Ricard Dillon., MD Tissue Punch: No Number of Specimens Taken: 1 Specimen Sent To Pathology: Yes Level of Consciousness (Pre-procedure): Awake and Alert Pre-procedure Verification/Time-Out Yes - 13:45 Taken: Pain Control: Lidocaine Injectable Lidocaine Percent: 1% Instrument: Blade, Forceps Bleeding: Moderate Hemostasis Achieved: Pressure Procedural Pain: 0 Post Procedural Pain: 0 Response to Treatment: Procedure was tolerated well Level of Consciousness (Post-procedure): Awake and Alert Post Procedure Diagnosis Same as Pre-procedure Electronic Signature(s) Signed: 01/27/2019 6:06:00 PM By: Linton Ham MD Entered By: Linton Ham on 01/27/2019 14:25:52 -------------------------------------------------------------------------------- Debridement Details Patient Name: Date of Service: Kelsey Joseph 01/27/2019 12:45 PM Medical Record [id number] Patient Account Number: [id number] Date of Birth/Sex: Treating RN: Feb 26, 1928 (85 y.o. [name] Primary Care Provider: Alden Server Other Clinician: Referring Provider: Treating Provider/Extender:Robson, Towana Badger, Rutherford Limerick in Treatment: 8 Debridement Performed for Wound #1 Left Knee Assessment: Performed By: Physician Ricard Dillon., MD Debridement Type: Debridement Severity of Tissue Pre Fat layer exposed Debridement: Level of Consciousness (Pre- Awake and  Alert procedure): Pre-procedure Yes - 13:45 Verification/Time Out Taken: Start Time: 13:45 Pain Control: Lidocaine Injectable : 1 Total Area Debrided (L x W): 2.6 (cm) x 1.9 (cm) = 4.94 (cm) Tissue and other material Viable, Non-Viable, Slough, Subcutaneous, Slough debrided: Level: Skin/Subcutaneous Tissue Debridement Description: Excisional Instrument: Curette Bleeding: Moderate Hemostasis Achieved: Pressure End Time: 13:47 Procedural Pain: 0 Post Procedural Pain: 0 Response to Treatment: Procedure was tolerated well Level of Consciousness Awake and Alert (Post-procedure): Post Debridement Measurements of Total Wound Length: (cm) 2.6 Width: (cm) 1.5 Depth: (cm) 0.2 Volume: (cm) 0.613 Character of Wound/Ulcer Post Improved Debridement: Severity of Tissue Post Debridement: Fat layer exposed Post Procedure Diagnosis Same as Pre-procedure Electronic Signature(s) Signed: 01/27/2019 5:59:58 PM By: Levan Hurst RN, BSN Signed: 01/27/2019 6:06:00 PM By: Linton Ham MD Entered By: Linton Ham on 01/27/2019 14:25:46 -------------------------------------------------------------------------------- HPI Details Patient Name: Date of Service: Kelsey Joseph, Kelsey Joseph 01/27/2019 12:45 PM Medical Record [id number] Patient Account Number: [id number] Date of Birth/Sex: Treating RN: 24-Aug-1927 (85 y.o. [name] Primary Care Provider: Alden Server Other Clinician: Referring Provider: Treating Provider/Extender:Robson, Towana Badger, Rutherford Limerick in Treatment: 8 History of Present Illness HPI Description: 11/30/2018 patient presents today for initial evaluation in our clinic concerning issues that she is having with her left knee. She states that she does not really know of any specific injury that occurred and cause this ulcer. Nonetheless she does have a scratch on her leg from her dog but again she is not aware of her dog actually scratching her at this  location. She does have a history of multiple knee replacements with the last being in 2006 I believe this was her third. She also has obviously a artificial knee joint on the left. She has hypertension, paroxysmal atrial fibrillation, congestive heart failure, and a right above-knee amputation. This was secondary to osteomyelitis. At this time patient is having no pain with regard to her knee at this point. Fortunately she is also having no signs of active infection at this time as far as I can see. 12/07/2018  upon evaluation today patient actually appears to be showing some signs of improvement which is good news. She still though having a little bit of granulation around the edge of the wound bed has not had as much of a improvement right in the base of the wound as I like to see this is still very dry. Nonetheless I believe that we may want to add hydrogel to the collagen in order to allow this to heal more appropriately. She is definitely in agreement with giving this a try. The good news is there does not appear to be any signs of infection. 9/17; patient have not seen previously. 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 She has had skin grafting and also an artificial knee joint apparently from 2006 on the left. She has a small open area on the left lateral part of her knee. The exact reason for this is unclear. We have been using silver collagen with some improvement initially but not since the last visit. She is unaware of any skin issues in this area before it open. She thinks she has had this for about 2 months 9/24 absolutely no change in the condition of this wound. Almost looks like a small crater although the patient was not aware of a skin condition in this area. We have been using silver collagen absolutely no change 10/2; pathology of the nodule that did open last time suggested  damage scar tissue. Also suggested the possibility of a follicle or cyst. There was no malignancy. We have been using silver collagen 10/9; wound surface is slightly smaller but no suggestion of viable tissue. Still requiring debridement we are using Iodoflex 10/23; 2-week follow-up. Her wound is somewhat better in terms of the surface although quite a bit bigger. Because of the rapid expansion and surface area I have done a shave biopsy of this now large wound. This started as a small cavitating nodule. We have been using Iodoflex I changed her to [facility] today Electronic Signature(s) Signed: 01/27/2019 6:06:00 PM By: Linton Ham MD Entered By: Linton Ham on 01/27/2019 14:27:17 -------------------------------------------------------------------------------- Physician Orders Details Patient Name: Date of Service: Kelsey Joseph, Kelsey Joseph 01/27/2019 12:45 PM Medical Record [id number] Patient Account Number: [id number] Date of Birth/Sex: Treating RN: 04/07/1927 (85 y.o. [name] Primary Care Provider: Alden Server Other Clinician: Referring Provider: Treating Provider/Extender:Robson, Towana Badger, Rutherford Limerick in Treatment: 8 Verbal / Phone Orders: No Diagnosis Coding ICD-10 Coding Code Description S81.002A Unspecified open wound, left knee, initial encounter L97.822 Non-pressure chronic ulcer of other part of left lower leg with fat layer exposed Z96.652 Presence of left artificial knee joint I10 Essential (primary) hypertension I48.0 Paroxysmal atrial fibrillation I50.42 Chronic combined systolic (congestive) and diastolic (congestive) heart failure Z89.611 Acquired absence of right leg above knee Follow-up Appointments Return Appointment in 2 weeks. Dressing Change Frequency Wound #1 Left Knee Change Dressing every other day. Wound Cleansing Wound #1 Left Knee May shower and wash wound with soap and water. Primary Wound Dressing Wound #1 Left  Knee Hydrofera Blue - classic - if available Secondary Dressing Wound #1 Left Knee Foam Border - or large bordered gauze. Edema Control Elevate legs to the level of the heart or above for 30 minutes daily and/or when sitting, a frequency of: - throughout the day. Laboratory Bacteria identified in Tissue by Biopsy culture (MICRO) - Left knee - (ICD10 [id number] - Non-pressure chronic ulcer of  other part of left lower leg with fat layer exposed) LOINC Code: [id number] Convenience Name: Biopsy specimen culture Electronic Signature(s) Signed: 01/27/2019 5:59:58 PM By: Levan Hurst RN, BSN Signed: 01/27/2019 6:06:00 PM By: Linton Ham MD Entered By: Levan Hurst on 01/27/2019 13:49:52 -------------------------------------------------------------------------------- Problem List Details Patient Name: Date of Service: Kelsey Joseph, Kelsey Joseph 01/27/2019 12:45 PM Medical Record [id number] Patient Account Number: [id number] Date of Birth/Sex: Treating RN: Aug 10, 1927 (85 y.o. [name] Primary Care Provider: Alden Server Other Clinician: Referring Provider: Treating Provider/Extender:Robson, Towana Badger, Rutherford Limerick in Treatment: 8 Active Problems ICD-10 Evaluated Encounter Code Description Active Date Today Diagnosis S81.002A Unspecified open wound, left knee, initial encounter 11/30/2018 No Yes L97.822 Non-pressure chronic ulcer of other part of left lower 11/30/2018 No Yes leg with fat layer exposed Z96.652 Presence of left artificial knee joint 11/30/2018 No Yes I10 Essential (primary) hypertension 11/30/2018 No Yes I48.0 Paroxysmal atrial fibrillation 11/30/2018 No Yes I50.42 Chronic combined systolic (congestive) and diastolic [id number] No Yes (congestive) heart failure Z89.611 Acquired absence of right leg above knee 11/30/2018 No Yes Inactive Problems Resolved Problems Electronic Signature(s) Signed: 01/27/2019 6:06:00 PM By: Linton Ham MD Entered By: Linton Ham on 01/27/2019 14:25:30 -------------------------------------------------------------------------------- Progress Note Details Patient Name: Date of Service: Kelsey Joseph 01/27/2019 12:45 PM Medical Record [id number] Patient Account Number: [id number] Date of Birth/Sex: Treating RN: 08/16/1927 (85 y.o. [name] Primary Care Provider: Alden Server Other Clinician: Referring Provider: Treating Provider/Extender:Robson, Towana Badger, Rutherford Limerick in Treatment: 8 Subjective History of Present Illness (HPI) 11/30/2018 patient presents today for initial evaluation in our clinic concerning issues that she is having with her left knee. She states that she does not really know of any specific injury that occurred and cause this ulcer. Nonetheless she does have a scratch on her leg from her dog but again she is not aware of her dog actually scratching her at this location. She does have a history of multiple knee replacements with the last being in 2006 I believe this was her third. She also has obviously a artificial knee joint on the left. She has hypertension, paroxysmal atrial fibrillation, congestive heart failure, and a right above-knee amputation. This was secondary to osteomyelitis. At this time patient is having no pain with regard to her knee at this point. Fortunately she is also having no signs of active infection at this time as far as I can see. 12/07/2018 upon evaluation today patient actually appears to be showing some signs of improvement which is good news. She still though having a little bit of granulation around the edge of the wound bed has not had as much of a improvement right in the base of the wound as I like to see this is still very dry. Nonetheless I believe that we may want to add hydrogel to the collagen in order to allow this to heal more appropriately. She is definitely in agreement with giving this a try. The good news is there does not  appear to be any signs of infection. 9/17; patient have not seen previously. 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 She has had skin grafting and also an artificial knee joint apparently from 2006 on the left. She has a small open area on the left lateral part of her knee. The exact reason for this is unclear. We have been using silver collagen with some improvement initially but not  since the last visit. She is unaware of any skin issues in this area before it open. She thinks she has had this for about 2 months 9/24 absolutely no change in the condition of this wound. Almost looks like a small crater although the patient was not aware of a skin condition in this area. We have been using silver collagen absolutely no change 10/2; pathology of the nodule that did open last time suggested damage scar tissue. Also suggested the possibility of a follicle or cyst. There was no malignancy. We have been using silver collagen 10/9; wound surface is slightly smaller but no suggestion of viable tissue. Still requiring debridement we are using Iodoflex 10/23; 2-week follow-up. Her wound is somewhat better in terms of the surface although quite a bit bigger. Because of the rapid expansion and surface area I have done a shave biopsy of this now large wound. This started as a small cavitating nodule. We have been using Iodoflex I changed her to [facility] today Objective Constitutional Vitals Time Taken: 1:25 PM, Height: 63 in, Weight: 122 lbs, BMI: 21.6, Temperature: 98.2 F, Pulse: 76 bpm, Respiratory Rate: 20 breaths/min, Blood Pressure: 110/50 mmHg. Integumentary (Hair, Skin) Wound #1 status is Open. Original cause of wound was Gradually Appeared. The wound is located on the Left Knee. The wound measures 2.6cm length x 1.9cm width x 0.2cm depth; 3.88cm^2 area and 0.776cm^3 volume. There is Fat  Layer (Subcutaneous Tissue) Exposed exposed. There is no tunneling or undermining noted. There is a medium amount of serosanguineous drainage noted. The wound margin is epibole. There is medium (34-66%) pink granulation within the wound bed. There is a medium (34-66%) amount of necrotic tissue within the wound bed including Adherent Slough. 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 There was a Excisional Skin/Subcutaneous Tissue Debridement with a total area of 4.94 sq cm performed by Ricard Dillon., MD. With the following instrument(s): Curette to remove Viable and Non-Viable tissue/material. Material removed includes Subcutaneous Tissue and Slough and after achieving pain control using Lidocaine Injectable: 1%. No specimens were taken. A time out was conducted at 13:45, prior to the start of the procedure. A Moderate amount of bleeding was controlled with Pressure. The procedure was tolerated well with a pain level of 0 throughout and a pain level of 0 following the procedure. Post Debridement Measurements: 2.6cm length x 1.5cm width x 0.2cm depth; 0.613cm^3 volume. Character of Wound/Ulcer Post Debridement is improved. Severity of Tissue Post Debridement is: Fat layer exposed. Post procedure Diagnosis Wound #1: Same as Pre-Procedure Pre-procedure diagnosis of Wound #1 is a Trauma, Other located on the Left Knee . There was a biopsy performed by Ricard Dillon., MD. There was a  biopsy performed on Wound Margin. The skin was cleansed and prepped with anti-septic followed by pain control using Lidocaine Injectable: 1%. Tissue was removed at its base with the following instrument(s): Blade and Forceps and sent to pathology. A Moderate amount of bleeding was controlled with Pressure. A time out was conducted at 13:45, prior to the start of the procedure. The  procedure was tolerated well with a pain level of 0 throughout and a pain level of 0 following the procedure. Post procedure Diagnosis Wound #1: Same as Pre-Procedure Plan Follow-up Appointments: Return Appointment in 2 weeks. Dressing Change Frequency: Wound #1 Left Knee: Change Dressing every other day. Wound Cleansing: Wound #1 Left Knee: May shower and wash wound with soap and water. Primary Wound Dressing: Wound #1 Left Knee: Hydrofera Blue - classic - if available Secondary Dressing: Wound #1 Left Knee: Foam Border - or large bordered gauze. Edema Control: Elevate legs to the level of the heart or above for 30 minutes daily and/or when sitting, a frequency of: - throughout the day. Laboratory ordered were: Biopsy specimen culture - Left knee 1. Very puzzling area. This started as a small nodule in the center of previous scar tissue from previous surgery. I found the nodule somewhat suspicious took this off and sent it to pathology this was benign however since then the wound has rapidly increased in size it does not look to be infected. I have gone ahead and done another shave biopsy today. Could consider additional cultures if this is not helpful. I then aggressively debrided the area of very adherent fibrinous and some necrotic material. Change the dressing to [facility] the patient is changing this or so Electronic Signature(s) Signed: 01/27/2019 6:06:00 PM By: Linton Ham MD Entered By: Linton Ham on 01/27/2019  14:30:10 -------------------------------------------------------------------------------- SuperBill Details Patient Name: Date of Service: Kelsey Joseph, Kelsey Joseph 01/27/2019 Medical Record [id number] Patient Account Number: [id number] Date of Birth/Sex: Treating RN: 06/16/1927 (85 y.o. [name] Primary Care Provider: Alden Server Other Clinician: Referring Provider: Treating Provider/Extender:Robson, Towana Badger, Rutherford Limerick in Treatment: 8 Diagnosis Coding ICD-10 Codes Code Description S81.002A Unspecified open wound, left knee, initial encounter L97.822 Non-pressure chronic ulcer of other part of left lower leg with fat layer exposed Z96.652 Presence of left artificial knee joint I10 Essential (primary) hypertension I48.0 Paroxysmal atrial fibrillation I50.42 Chronic combined systolic (congestive) and diastolic (congestive) heart failure Z89.611 Acquired absence of right leg above knee Facility Procedures The patient participates with Medicare or their insurance follows the Medicare Facility Guidelines: CPT4 Description Modifier Quantity Code [id number] 11102-Tangential biopsy of skin (e.g., shave, scoop, saucerize, curette) single 1 lesion ICD-10 Diagnosis  Description L97.822 Non-pressure chronic ulcer of other part of left lower leg with fat layer exposed Physician Procedures CPT4 Code Description: 11102 Tangential biopsy of skin (e.g., shave, scoop, saucerize, curette) single lesion ICD-10 Diagnosis Description ICD-10 Diagnosis Description L97.822 Non-pressure chronic ulcer of other part of left lower leg wit Modifier: h fat layer exp Quantity: 1 osed Electronic Signature(s) Signed: 01/30/2019 5:48:14 PM By: Linton Ham MD Signed: 01/30/2019 6:04:10 PM By: Levan Hurst RN, BSN Previous Signature: 01/27/2019 6:06:00 PM Version By: Linton Ham MD Entered By: Levan Hurst on 01/30/2019 08:05:29

## 2019-01-29 NOTE — Telephone Encounter (Signed)
Thanks. I didn't see anything in Epic as far as arterial studies and just wanted to follow up that she has been seen for this.

## 2019-01-30 NOTE — Progress Notes (Signed)
Kelsey Joseph, Kelsey Joseph ([id number]) Visit Report for 01/27/2019 Arrival Information Details Patient Name: Date of Service: Kelsey Joseph, Kelsey Joseph 01/27/2019 12:45 PM Medical Record [id number] Patient Account Number: [id number] Date of Birth/Sex: Treating RN: April 12, 1927 (85 y.o. [name], Meta.Reding Primary Care Lukah Goswami: Alden Server Other Clinician: Referring Paddy Neis: Treating Destan Franchini/Extender:Robson, Towana Badger, Rutherford [place] in Treatment: 8 Visit Information History Since Last Visit Added or deleted any medications: No Patient Arrived: Wheel Chair Any new allergies or adverse reactions: No Arrival Time: 13:20 Had a fall or experienced change in No activities of daily living that may affect Accompanied By: self risk of falls: Transfer Assistance: None Signs or symptoms of abuse/neglect since last No Patient Identification Verified: Yes visito Secondary Verification Process Completed: Yes Hospitalized since last visit: No Patient Requires Transmission-Based No Implantable device outside of the clinic excluding No Precautions: cellular tissue based products placed in the center Patient Has Alerts: No since last visit: Has Dressing in Place as Prescribed: Yes Pain Present Now: No Electronic Signature(s) Signed: 01/30/2019 5:31:38 PM By: Deon Pilling Entered By: Deon Pilling on 01/27/2019 13:25:03 -------------------------------------------------------------------------------- Encounter Discharge Information Details Patient Name: Date of Service: Kelsey Joseph, Kelsey Joseph 01/27/2019 12:45 PM Medical Record [id number] Patient Account Number: [id number] Date of Birth/Sex: Treating RN: July 20, 1927 (85 y.o. [name] Primary Care Jefrey Raburn: Alden Server Other Clinician: Referring Lanina Larranaga: Treating Brenlynn Fake/Extender:Robson, Towana Badger, Rutherford [place] in Treatment: 8 Encounter Discharge Information Items Post Procedure Vitals Discharge Condition:  Stable Temperature (F): 98.2 Ambulatory Status: Wheelchair Pulse (bpm): 76 Discharge Destination: Home Respiratory Rate (breaths/min): 20 Transportation: Private Auto Blood Pressure (mmHg): 110/50 Accompanied By: self Schedule Follow-up Appointment: Yes Clinical Summary of Care: Patient Declined Electronic Signature(s) Signed: 01/30/2019 5:21:50 PM By: Kela Millin Entered By: Kela Millin on 01/27/2019 18:51:50 -------------------------------------------------------------------------------- Lower Extremity Assessment Details Patient Name: Date of Service: Kelsey Joseph, Kelsey Joseph 01/27/2019 12:45 PM Medical Record [id number] Patient Account Number: [id number] Date of Birth/Sex: Treating RN: [birth year] (85 y.o. [name], Meta.Reding Primary Care Kitana Gage: Alden Server Other Clinician: Referring Saveon Plant: Treating Rosmary Dionisio/Extender:Robson, Towana Badger, Rutherford [place] in Treatment: 8 Edema Assessment Assessed: [Left: Yes] [Right: No] Edema: [Left: N] [Right: o] Calf Left: Right: Point of Measurement: 37 cm From Medial Instep 30 cm cm Ankle Left: Right: Point of Measurement: 10 cm From Medial Instep 20 cm cm Electronic Signature(s) Signed: 01/30/2019 5:31:38 PM By: Deon Pilling Entered By: Deon Pilling on 01/27/2019 13:26:56 -------------------------------------------------------------------------------- Multi Wound Chart Details Patient Name: Date of Service: Kelsey Joseph 01/27/2019 12:45 PM Medical Record [id number] Patient Account Number: [id number] Date of Birth/Sex: Treating RN: 1927-10-26 (85 y.o. [name] Primary Care Weslie Pretlow: Alden Server Other Clinician: Referring Magalene Mclear: Treating Faye Sanfilippo/Extender:Robson, Towana Badger, Rutherford [place] in Treatment: 8 Vital Signs Height(in): 63 Pulse(bpm): 76 Weight(lbs): 122 Blood Pressure(mmHg): 110/50 Body Mass Index(BMI): 22 Temperature(F):  98.2 Respiratory 20 Rate(breaths/min): Photos: [1:No Photos] [N/A:N/A] Wound Location: [1:Left Knee] [N/A:N/A] Wounding Event: [1:Gradually Appeared] [N/A:N/A] Primary Etiology: [1:Trauma, Other] [N/A:N/A] Secondary Etiology: [1:Diabetic Wound/Ulcer of the N/A Lower Extremity] Comorbid History: [1:Sleep Apnea, Congestive N/A Heart Failure, Coronary Artery Disease, Type II Diabetes, Osteomyelitis, Neuropathy] Date Acquired: [1:09/09/2018] [N/A:N/A] Weeks of Treatment: [1:8] [N/A:N/A] Wound Status: [1:Open] [N/A:N/A] Measurements L x W x D 2.6x1.9x0.2 [N/A:N/A] (cm) Area (cm) : [1:3.88] [N/A:N/A] Volume (cm) : [1:0.776] [N/A:N/A] % Reduction in Area: [1:-1310.90%] [N/A:N/A] % Reduction in Volume: -1310.90% [N/A:N/A] Classification: [1:Full Thickness Without Exposed Support Structures] [N/A:N/A] Exudate Amount: [1:Medium] [N/A:N/A] Exudate Type: [1:Serosanguineous] [N/A:N/A] Exudate Color: [1:red, brown] [N/A:N/A] Wound  Margin: [1:Epibole] [N/A:N/A] Granulation Amount: [1:Medium (34-66%)] [N/A:N/A] Granulation Quality: [1:Pink] [N/A:N/A] Necrotic Amount: [1:Medium (34-66%)] [N/A:N/A] Exposed Structures: [1:Fat Layer (Subcutaneous N/A Tissue) Exposed: Yes Fascia: No Tendon: No Muscle: No Joint: No Bone: No] Epithelialization: [1:None] [N/A:N/A] Debridement: [1:Debridement - Excisional N/A] Pre-procedure [1:13:45] [N/A:N/A] Verification/Time Out Taken: Pain Control: [1:Lidocaine Injectable] [N/A:N/A] Tissue Debrided: [1:Subcutaneous, Slough] [N/A:N/A] Level: [1:Skin/Subcutaneous Tissue N/A] Debridement Area (sq cm):4.94 [N/A:N/A] Instrument: [1:Curette] [N/A:N/A] Bleeding: [1:Moderate] [N/A:N/A] Hemostasis Achieved: [1:Pressure] [N/A:N/A] Procedural Pain: [1:0] [N/A:N/A] Post Procedural Pain: [1:0] [N/A:N/A] Debridement Treatment Procedure was tolerated [N/A:N/A] Response: [1:well] Post Debridement [1:2.6x1.5x0.2] [N/A:N/A] Measurements L x W x D (cm) Post Debridement  [1:0.613] [N/A:N/A] Volume: (cm) Procedures Performed: Biopsy [1:Debridement] [N/A:N/A] Treatment Notes Electronic Signature(s) Signed: 01/27/2019 5:59:58 PM By: Levan Hurst RN, BSN Signed: 01/27/2019 6:06:00 PM By: Linton Ham MD Entered By: Linton Ham on 01/27/2019 14:25:39 -------------------------------------------------------------------------------- Multi-Disciplinary Care Plan Details Patient Name: Date of Service: Kelsey Joseph, Kelsey Joseph 01/27/2019 12:45 PM Medical Record [id number] Patient Account Number: [id number] Date of Birth/Sex: Treating RN: 12/21/1927 (85 y.o. [name] Primary Care Nickolas Chalfin: Alden Server Other Clinician: Referring Abbigail Anstey: Treating Kienan Doublin/Extender:Robson, Towana Badger, Rutherford [place] in Treatment: 8 Active Inactive Wound/Skin Impairment Nursing Diagnoses: Impaired tissue integrity Knowledge deficit related to ulceration/compromised skin integrity Goals: Patient/caregiver will verbalize understanding of skin care regimen Date Initiated: 11/30/2018 Target Resolution Date: 02/24/2019 Goal Status: Active Ulcer/skin breakdown will have a volume reduction of 30% by week 4 Date Initiated: 11/30/2018 Date Inactivated: 01/06/2019 Target Resolution Date: 12/29/2018 Unmet Reason: debrided Goal Status: Unmet undermining Ulcer/skin breakdown will have a volume reduction of 50% by week 8 Date Inactivated: 01/27/2019 Target10/23/2020 Resolution Date Initiated: 01/06/2019 Date: Unmet Reason: multiple Goal Status: Unmet debridements, atypical wound Interventions: Assess patient/caregiver ability to obtain necessary supplies Assess patient/caregiver ability to perform ulcer/skin care regimen upon admission and as needed Assess ulceration(s) every visit Provide education on ulcer and skin care Treatment Activities: Skin care regimen initiated : 11/30/2018 Topical wound management initiated : 11/30/2018 Notes: Electronic  Signature(s) Signed: 01/27/2019 5:59:58 PM By: Levan Hurst RN, BSN Entered By: Levan Hurst on 01/27/2019 14:30:30 -------------------------------------------------------------------------------- Pain Assessment Details Patient Name: Date of Service: Kelsey Joseph, Kelsey Joseph 01/27/2019 12:45 PM Medical Record [id number] Patient Account Number: [id number] Date of Birth/Sex: Treating RN: 01-25-1928 (85 y.o. Debby Bud Primary Care Saba Gomm: Alden Server Other Clinician: Referring Arletta Lumadue: Treating Adelena Desantiago/Extender:Robson, Towana Badger, Rutherford [place] in Treatment: 8 Active Problems Location of Pain Severity and Description of Pain Patient Has Paino No Site Locations Rate the pain. Current Pain Level: 0 Pain Management and Medication Current Pain Management: Medication: No Cold Application: No Rest: No Massage: No Activity: No T.E.N.S.: No Heat Application: No Leg drop or elevation: No Is the Current Pain Management Adequate: Adequate How does your wound impact your activities of daily livingo Sleep: No Bathing: No Appetite: No Relationship With Others: No Bladder Continence: No Emotions: No Bowel Continence: No Work: No Toileting: No Drive: No Dressing: No Hobbies: No Electronic Signature(s) Signed: 01/30/2019 5:31:38 PM By: Deon Pilling Entered By: Deon Pilling on 01/27/2019 13:26:29 -------------------------------------------------------------------------------- Patient/Caregiver Education Details Patient Name: Date of Service: Kelsey Joseph 10/23/2020andnbsp12:45 PM Medical Record [id number] Patient Account Number: [id number] Date of Birth/Gender: Treating RN: 05-Feb-1928 (85 y.o. [name] Primary Care Physician: Alden Server Other Clinician: Referring Physician: Treating Physician/Extender:Robson, Towana Badger, Rutherford [place] in Treatment: 8 Education Assessment Education Provided To: Patient Education Topics  Provided Wound/Skin Impairment: Methods: Explain/Verbal Responses: State content correctly Electronic Signature(s) Signed: 01/27/2019  5:59:58 PM By: Levan Hurst RN, BSN Entered By: Levan Hurst on 01/27/2019 13:22:54 -------------------------------------------------------------------------------- Wound Assessment Details Patient Name: Date of Service: Kelsey Joseph, Kelsey Joseph 01/27/2019 12:45 PM Medical Record [id number] Patient Account Number: [id number] Date of Birth/Sex: Treating RN: Oct 21, 1927 (85 y.o. [name] Primary Care Kaysan Peixoto: Alden Server Other Clinician: Referring Dulcemaria Bula: Treating Zaila Crew/Extender:Robson, Towana Badger, Rutherford [place] in Treatment: 8 Wound Status Wound Number: 1 Primary Trauma, Other Etiology: Wound Location: Left Knee Secondary Diabetic Wound/Ulcer of the Lower Extremity Wounding Event: Gradually Appeared Etiology: Date Acquired: 09/09/2018 Wound Open Weeks Of Treatment: 8 Status: Clustered Wound: No Comorbid Sleep Apnea, Congestive Heart Failure, History: Coronary Artery Disease, Type II Diabetes, Osteomyelitis, Neuropathy Wound Measurements Length: (cm) 2.6 % Reduct Width: (cm) 1.9 % Reduct Depth: (cm) 0.2 Epitheli Area: (cm) 3.88 Tunneli Volume: (cm) 0.776 Undermi Wound Description Classification: Full Thickness Without Exposed Support Foul Odo Structures Slough/F Wound Epibole Margin: Exudate Medium Amount: Exudate Serosanguineous Type: Exudate red, brown Color: Wound Bed Granulation Amount: Medium (34-66%) Granulation Quality: Pink Fascia E Necrotic Amount: Medium (34-66%) Fat Laye Necrotic Quality: Adherent Slough Tendon E Muscle E Joint Ex Bone Exp r After Cleansing: No ibrino Yes Exposed Structure xposed: No r (Subcutaneous Tissue) Exposed: Yes xposed: No xposed: No posed: No osed: No ion in Area: -1310.9% ion in Volume: -1310.9% alization: None ng: No ning: No Treatment Notes Wound #1  (Left Knee) 1. Cleanse With Wound Cleanser 2. Periwound Care Skin Prep 3. Primary Dressing Applied Hydrofera Blue 4. Secondary Dressing Dry Gauze Other secondary dressing (specify in notes) Notes bordered gauze Electronic Signature(s) Signed: 01/27/2019 5:59:58 PM By: Levan Hurst RN, BSN Entered By: Levan Hurst on 01/27/2019 13:39:56 -------------------------------------------------------------------------------- Staples Details Patient Name: Date of Service: Kelsey Joseph, Kelsey Joseph 01/27/2019 12:45 PM Medical Record [id number] Patient Account Number: [id number] Date of Birth/Sex: Treating RN: 1927/08/22 (85 y.o. [name], Meta.Reding Primary Care Christine Morton: Alden Server Other Clinician: Referring Aydia Maj: Treating Kritika Stukes/Extender:Robson, Towana Badger, Rutherford [place] in Treatment: 8 Vital Signs Time Taken: 13:25 Temperature (F): 98.2 Height (in): 63 Pulse (bpm): 76 Weight (lbs): 122 Respiratory Rate (breaths/min): 20 Body Mass Index (BMI): 21.6 Blood Pressure (mmHg): 110/50 Reference Range: 80 - 120 mg / dl Electronic Signature(s) Signed: 01/30/2019 5:31:38 PM By: Deon Pilling Entered By: Deon Pilling on 01/27/2019 13:26:18

## 2019-02-08 ENCOUNTER — Telehealth: Payer: Self-pay | Admitting: Neurology

## 2019-02-08 ENCOUNTER — Ambulatory Visit
Admission: RE | Admit: 2019-02-08 | Discharge: 2019-02-08 | Disposition: A | Discharge status: Home / Self Care | Payer: Medicare Other | Source: Ambulatory Visit | Attending: Neurology | Admitting: Neurology

## 2019-02-08 ENCOUNTER — Other Ambulatory Visit: Payer: Self-pay

## 2019-02-08 DIAGNOSIS — M5126 Other intervertebral disc displacement, lumbar region: Secondary | ICD-10-CM | POA: Diagnosis not present

## 2019-02-08 DIAGNOSIS — R202 Paresthesia of skin: Secondary | ICD-10-CM

## 2019-02-08 DIAGNOSIS — M48061 Spinal stenosis, lumbar region without neurogenic claudication: Secondary | ICD-10-CM | POA: Diagnosis not present

## 2019-02-08 DIAGNOSIS — R159 Full incontinence of feces: Secondary | ICD-10-CM

## 2019-02-08 NOTE — Telephone Encounter (Signed)
Please call patient, repeat CT of lumbar spine showed severe spinal stenosis at  L4 5, mildly worse than previous CT myelogram in 2016, severe spinal stenosis at L3-4.  Severe chronic left foraminal stenosis at L5-S1 with compression of left S1 nerve roots,  Above findings would explain her recent worsening of left foot numbness and weakness  IMPRESSION: 1. Severe spinal stenosis at L4-5 markedly worse than on the prior CT myelogram of 2016. 2. Severe spinal stenosis at L3-4, slightly worse than on the prior study. 3. Severe chronic left foraminal stenosis at L5-S1 with compression of the left S1 nerve in the lateral recess, similar to the appearance on the prior CT myelogram. 4. Aortic atherosclerosis.  Aortic Atherosclerosis (ICD10-I70.0).

## 2019-02-08 NOTE — Telephone Encounter (Addendum)
I spoke to the patient and she verbalized understanding of the results. She will keep her pending follow up, on 04/03/2019, for further review with Dr. Krista Blue.

## 2019-02-10 ENCOUNTER — Encounter (HOSPITAL_BASED_OUTPATIENT_CLINIC_OR_DEPARTMENT_OTHER): Payer: Medicare Other | Attending: Internal Medicine | Admitting: Internal Medicine

## 2019-02-10 ENCOUNTER — Other Ambulatory Visit: Payer: Self-pay

## 2019-02-10 DIAGNOSIS — I110 Hypertensive heart disease with heart failure: Secondary | ICD-10-CM | POA: Diagnosis not present

## 2019-02-10 DIAGNOSIS — E11622 Type 2 diabetes mellitus with other skin ulcer: Secondary | ICD-10-CM | POA: Insufficient documentation

## 2019-02-10 DIAGNOSIS — L97822 Non-pressure chronic ulcer of other part of left lower leg with fat layer exposed: Secondary | ICD-10-CM | POA: Insufficient documentation

## 2019-02-10 DIAGNOSIS — E1140 Type 2 diabetes mellitus with diabetic neuropathy, unspecified: Secondary | ICD-10-CM | POA: Insufficient documentation

## 2019-02-10 DIAGNOSIS — I2510 Atherosclerotic heart disease of native coronary artery without angina pectoris: Secondary | ICD-10-CM | POA: Diagnosis not present

## 2019-02-10 DIAGNOSIS — I480 Paroxysmal atrial fibrillation: Secondary | ICD-10-CM | POA: Insufficient documentation

## 2019-02-10 DIAGNOSIS — Z89611 Acquired absence of right leg above knee: Secondary | ICD-10-CM | POA: Diagnosis not present

## 2019-02-10 DIAGNOSIS — I5042 Chronic combined systolic (congestive) and diastolic (congestive) heart failure: Secondary | ICD-10-CM | POA: Diagnosis not present

## 2019-02-10 DIAGNOSIS — Z96652 Presence of left artificial knee joint: Secondary | ICD-10-CM | POA: Insufficient documentation

## 2019-02-12 NOTE — Progress Notes (Addendum)
Kelsey, Joseph ([id number]) Visit Report for 02/10/2019 Arrival Information Details Patient Name: Date of Service: Kelsey Joseph, LABO 02/10/2019 12:45 PM Medical Record [id number] Patient Account Number: [id number] Date of Birth/Sex: Treating RN: 17-Mar-1928 (85 y.o. Kelsey Joseph Shoe, Meta.Reding Primary Care Alessandro Griep: Alden Server Other Clinician: Referring Alison Kubicki: Treating Tayari Yankee/Extender:Robson, Towana Badger, Rutherford [place] in Treatment: 10 Visit Information History Since Last Visit Added or deleted any medications: No Patient Arrived: Wheel Chair Any new allergies or adverse reactions: No Arrival Time: 13:15 Had a fall or experienced change in No activities of daily living that may affect Accompanied By: self risk of falls: Transfer Assistance: None Signs or symptoms of abuse/neglect since last No Patient Identification Verified: Yes visito Secondary Verification Process Completed: Yes Hospitalized since last visit: No Patient Requires Transmission-Based No Implantable device outside of the clinic excluding No Precautions: cellular tissue based products placed in the center Patient Has Alerts: No since last visit: Has Dressing in Place as Prescribed: Yes Pain Present Now: No Electronic Signature(s) Signed: 02/10/2019 5:26:58 PM By: Deon Pilling Entered By: Deon Pilling on 02/10/2019 13:16:07 -------------------------------------------------------------------------------- Encounter Discharge Information Details Patient Name: Date of Service: Kelsey Joseph, Kelsey Joseph 02/10/2019 12:45 PM Medical Record [id number] Patient Account Number: [id number] Date of Birth/Sex: Treating RN: [birth year] (85 y.o. Kelsey Joseph Primary Care Carlus Stay: Alden Server Other Clinician: Referring Jannat Rosemeyer: Treating Meleni Delahunt/Extender:Robson, Towana Badger, Rutherford [place]imer[place] in Treatment: 10 Encounter Discharge Information Items Post Procedure Vitals Discharge Condition: Stable Temperature  (F): 98.476 Ambulatory Status: Wheelchair Pulse (bpm): 76 Discharge Destination: Home Respiratory Rate (breaths/min): 16 Transportation: Private Auto Blood Pressure (mmHg): 107/45 Accompanied By: self Schedule Follow-up Appointment: Yes Clinical Summary of Care: Electronic Signature(s) Signed: 02/10/2019 5:26:58 PM By: Deon Pilling Entered By: Deon Pilling on 02/10/2019 16:09:09 -------------------------------------------------------------------------------- Lower Extremity Assessment Details Patient Name: Date of Service: Kelsey Joseph, Kelsey Joseph 02/10/2019 12:45 PM Medical Record [id number] Patient Account Number: [id number] Date of Birth/Sex: Treating RN: 08/24/1927 (85 y.o. Kelsey Joseph Primary Care Shermika Balthaser: Alden Server Other Clinician: Referring Krayton Wortley: Treating Jamarco Zaldivar/Extender:Robson, Towana Badger, Rutherford [place] in Treatment: 10 Edema Assessment Assessed: [Left: No] [Right: No] Edema: [Left: N] [Right: o] Calf Left: Right: Point of Measurement: 37 cm From Medial Instep 30 cm cm Ankle Left: Right: Point of Measurement: 10 cm From Medial Instep 20 cm cm Notes no below the knee wound. Electronic Signature(s) Signed: 02/10/2019 5:26:58 PM By: Deon Pilling Entered By: Deon Pilling on 02/10/2019 13:17:20 -------------------------------------------------------------------------------- Multi Wound Chart Details Patient Name: Date of Service: Kelsey Joseph, Kelsey Joseph 02/10/2019 12:45 PM Medical Record [id number] Patient Account Number: [id number] Date of Birth/Sex: Treating RN: [birth year] (84 y.o. Kelsey Joseph Primary Care Rontrell Moquin: Alden Server Other Clinician: Referring Inice Sanluis: Treating Shakinah Navis/Extender:Robson, Towana Badger, Rutherford [place] in Treatment: 10 Vital Signs Height(in): 63 Pulse(bpm): 76 Weight(lbs): 122 Blood Pressure(mmHg): 107/45 Body Mass Index(BMI): 22 Temperature(F): 98.4 Respiratory 16 Rate(breaths/min): Photos: [1:No  Photos] [N/A:N/A] Wound Location: [1:Left Knee] [N/A:N/A] Wounding Event: [1:Gradually Appeared] [N/A:N/A] Primary Etiology: [1:Trauma, Other] [N/A:N/A] Secondary Etiology: [1:Diabetic Wound/Ulcer of the N/A Lower Extremity] Comorbid History: [1:Sleep Apnea, Congestive N/A Heart Failure, Coronary Artery Disease, Type II Diabetes, Osteomyelitis, Neuropathy] Date Acquired: [1:09/09/2018] [N/A:N/A] Weeks of Treatment: [1:10] [N/A:N/A] Wound Status: [1:Open] [N/A:N/A] Measurements L x W x D 2.6x1.7x0.2 [N/A:N/A] (cm) Area (cm) : [1:3.471] [N/A:N/A] Volume (cm) : [1:0.694] [N/A:N/A] % Reduction in Area: [1:-1162.20%] [N/A:N/A] % Reduction in Volume: -1161.80% [N/A:N/A] Classification: [1:Full Thickness Without Exposed Support Structures] [N/A:N/A] Exudate Amount: [1:Medium] [N/A:N/A] Exudate Type: [1:Serosanguineous] [N/A:N/A] Exudate Color: [  1:red, brown] [N/A:N/A] Wound Margin: [1:Epibole] [N/A:N/A] Granulation Amount: [1:Medium (34-66%)] [N/A:N/A] Granulation Quality: [1:Red, Pink] [N/A:N/A] Necrotic Amount: [1:Medium (34-66%)] [N/A:N/A] Exposed Structures: [1:Fat Layer (Subcutaneous N/A Tissue) Exposed: Yes Fascia: No Tendon: No Muscle: No Joint: No Bone: No] Epithelialization: [1:Small (1-33%)] [N/A:N/A] Debridement: [1:Debridement - Excisional N/A] Pre-procedure [1:13:44] [N/A:N/A] Verification/Time Out Taken: Pain Control: [1:Other] [N/A:N/A] Tissue Debrided: [1:Subcutaneous, Slough] [N/A:N/A] Level: [1:Skin/Subcutaneous Tissue N/A] Debridement Area (sq cm):4.42 [N/A:N/A] Instrument: [1:Curette] [N/A:N/A] Bleeding: [1:Minimum] [N/A:N/A] Hemostasis Achieved: [1:Pressure] [N/A:N/A] Debridement Treatment Procedure was tolerated [N/A:N/A] Response: [1:well] Post Debridement [1:2.6x1.7x0.2] [N/A:N/A] Measurements L x W x D (cm) Post Debridement [1:0.694] [N/A:N/A] Volume: (cm) Procedures Performed: Debridement [N/A:N/A] Treatment Notes Electronic Signature(s) Signed:  02/10/2019 6:12:06 PM By: Baruch Gouty RN, BSN Signed: 02/12/2019 8:54:13 AM By: Linton Ham MD Entered By: Linton Ham on 02/10/2019 14:23:22 -------------------------------------------------------------------------------- Multi-Disciplinary Care Plan Details Patient Name: Date of Service: Kelsey Joseph, Kelsey Joseph 02/10/2019 12:45 PM Medical Record [id number] Patient Account Number: [id number] Date of Birth/Sex: Treating RN: [birth year] (85 y.o. [name] Primary Care Delainey Winstanley: Alden Server Other Clinician: Referring Islah Eve: Treating Sandrina Heaton/Extender:Robson, Towana Badger, Rutherford [place] in Treatment: 10 Active Inactive Wound/Skin Impairment Nursing Diagnoses: Impaired tissue integrity Knowledge deficit related to ulceration/compromised skin integrity Goals: Patient/caregiver will verbalize understanding of skin care regimen Date Initiated: 11/30/2018 Target Resolution Date: 02/24/2019 Goal Status: Active Ulcer/skin breakdown will have a volume reduction of 30% by week 4 Date Initiated: 11/30/2018 Date Inactivated: 01/06/2019 Target Resolution Date: 12/29/2018 Unmet Reason: debrided Goal Status: Unmet undermining Ulcer/skin breakdown will have a volume reduction of 50% by week 8 Date Inactivated: 01/27/2019 Target10/23/2020 Resolution Date Initiated: 01/06/2019 Date: Unmet Reason: multiple Goal Status: Unmet debridements, atypical wound Interventions: Assess patient/caregiver ability to obtain necessary supplies Assess patient/caregiver ability to perform ulcer/skin care regimen upon admission and as needed Assess ulceration(s) every visit Provide education on ulcer and skin care Treatment Activities: Skin care regimen initiated : 11/30/2018 Topical wound management initiated : 11/30/2018 Notes: Electronic Signature(s) Signed: 02/10/2019 6:04:56 PM By: Kela Millin Entered By: Kela Millin on 02/10/2019  13:30:19 -------------------------------------------------------------------------------- Pain Assessment Details Patient Name: Date of Service: GENEICE, SCHANK 02/10/2019 12:45 PM Medical Record [id number] Patient Account Number: [id number] Date of Birth/Sex: Treating RN: 10-15-1927 (85 y.o. Kelsey Joseph Primary Care Zanobia Griebel: Alden Server Other Clinician: Referring Khoa Opdahl: Treating Liela Rylee/Extender:Robson, Towana Badger, Rutherford [place] in Treatment: 10 Active Problems Location of Pain Severity and Description of Pain Patient Has Paino No Site Locations Rate the pain. Current Pain Level: 0 Pain Management and Medication Current Pain Management: Medication: No Cold Application: No Rest: No Massage: No Activity: No T.E.N.S.: No Heat Application: No Leg drop or elevation: No Is the Current Pain Management Adequate: Adequate How does your wound impact your activities of daily livingo Sleep: No Bathing: No Appetite: No Relationship With Others: No Bladder Continence: No Emotions: No Bowel Continence: No Work: No Toileting: No Drive: No Dressing: No Hobbies: No Electronic Signature(s) Signed: 02/10/2019 5:26:58 PM By: Deon Pilling Entered By: Deon Pilling on 02/10/2019 13:17:10 -------------------------------------------------------------------------------- Patient/Caregiver Education Details Patient Name: Date of Service: [name] 11/6/2020andnbsp12:45 PM Medical Record [id number] Patient Account Number: [id number] Date of Birth/Gender: Treating RN: 1927/08/12 (85 y.o. [name] Primary Care Physician: Alden Server Other Clinician: Referring Physician: Treating Physician/Extender:Robson, Towana Badger, Rutherford L[place] in Treatment: 10 Education Assessment Education Provided To: Patient Education Topics Provided Wound/Skin Impairment: Methods: Explain/Verbal Responses: State content correctly Electronic  Signature(s) Signed: 02/10/2019 6:04:56 PM By: Kela Millin Entered By:  Kela Millin on 02/10/2019 13:30:45 -------------------------------------------------------------------------------- Wound Assessment Details Patient Name: Date of Service: Kelsey Joseph, Kelsey Joseph 02/10/2019 12:45 PM Medical Record [id number] Patient Account Number: [id number] Date of Birth/Sex: Treating RN: 1927-10-06 (85 y.o. Kelsey Joseph Shoe, Meta.Reding Primary Care Vennesa Bastedo: Alden Server Other Clinician: Referring Eldred Sooy: Treating Magdelene Ruark/Extender:Robson, Towana Badger, Rutherford [place] in Treatment: 10 Wound Status Wound Number: 1 Primary Trauma, Other Etiology: Wound Location: Left Knee Secondary Diabetic Wound/Ulcer of the Lower Extremity Wounding Event: Gradually Appeared Etiology: Etiology: Date Acquired: 09/09/2018 Wound Open Weeks Of Treatment: 10 Status: Clustered Wound: No Comorbid Sleep Apnea, Congestive Heart Failure, History: Coronary Artery Disease, Type II Diabetes, Osteomyelitis, Neuropathy Photos Wound Measurements Length: (cm) 2.6 % Reduct Width: (cm) 1.7 % Reduct Depth: (cm) 0.2 Epitheli Area: (cm) 3.471 Tunneli Volume: (cm) 0.694 Undermi Wound Description Classification: Full Thickness Without Exposed Support Foul Odo Structures Slough/F Wound Epibole Margin: Exudate Medium Amount: Exudate Serosanguineous Type: Exudate red, brown Color: Wound Bed Granulation Amount: Medium (34-66%) Granulation Quality: Red, Pink Fascia E Necrotic Amount: Medium (34-66%) Fat Laye Necrotic Quality: Adherent Slough Tendon E Muscle E Joint Ex Bone Exp r After Cleansing: No ibrino Yes Exposed Structure xposed: No r (Subcutaneous Tissue) Exposed: Yes xposed: No xposed: No posed: No osed: No ion in Area: -1162.2% ion in Volume: -1161.8% alization: Small (1-33%) ng: No ning: No Treatment Notes Wound #1 (Left Knee) 1. Cleanse With Wound Cleanser 2. Periwound Care Skin  Prep 3. Primary Dressing Applied Hydrofera Blue 4. Secondary Dressing Foam Border Dressing 5. Secured With Self Adhesive Bandage Notes bordered gauze Electronic Signature(s) Signed: 02/14/2019 4:05:15 PM By: Mikeal Hawthorne EMT/HBOT Signed: 02/15/2019 5:42:00 PM By: Deon Pilling Previous Signature: 02/10/2019 5:26:58 PM Version By: Deon Pilling Entered By: Mikeal Hawthorne on 02/14/2019 08:44:49 -------------------------------------------------------------------------------- Vitals Details Patient Name: Date of Service: Kelsey Joseph, Kelsey Joseph 02/10/2019 12:45 PM Medical Record [id number] Patient Account Number: [id number] Date of Birth/Sex: Treating RN: [birth year] (85 y.o. Kelsey Joseph Shoe, Meta.Reding Primary Care Taijuan Serviss: Alden Server Other Clinician: Referring Megumi Treaster: Treating Macallan Ord/Extender:Robson, Towana Badger, Rutherford [place] in Treatment: 10 Vital Signs Time Taken: 13:17 Temperature (F): 98.4 Height (in): 63 Pulse (bpm): 76 Weight (lbs): 122 Respiratory Rate (breaths/min): 16 Body Mass Index (BMI): 21.6 Blood Pressure (mmHg): 107/45 Reference Range: 80 - 120 mg / dl Electronic Signature(s) Signed: 02/10/2019 5:26:58 PM By: Deon Pilling Entered By: Deon Pilling on 02/10/2019 13:20:50

## 2019-02-12 NOTE — Progress Notes (Signed)
VERA, GEIGER ([id number]) Visit Report for 02/10/2019 Debridement Details Patient Name: Date of Service: Kelsey Joseph, Kelsey Joseph 02/10/2019 12:45 PM Medical Record [id number] Patient Account Number: [id number] Date of Birth/Sex: Treating RN: April 17, 1927 (85 y.o. Elam Dutch Primary Care Provider: Alden Server Other Clinician: Referring Provider: Treating Provider/Extender:Monico Sudduth, Towana Badger, Rutherford Limerick in Treatment: 10 Debridement Performed for Wound #1 Left Knee Assessment: Performed By: Physician Ricard Dillon., MD Debridement Type: Debridement Severity of Tissue Pre Fat layer exposed Debridement: Level of Consciousness (Pre- Awake and Alert procedure): Pre-procedure Verification/Time Out Taken: Yes - 13:44 Start Time: 13:44 Pain Control: Other : benzocaine, 20% Total Area Debrided (L x W): 2.6 (cm) x 1.7 (cm) = 4.42 (cm) Tissue and other material Viable, Non-Viable, Slough, Subcutaneous, Slough debrided: Level: Skin/Subcutaneous Tissue Debridement Description: Excisional Instrument: Curette Bleeding: Minimum Hemostasis Achieved: Pressure End Time: 13:45 Response to Treatment: Procedure was tolerated well Level of Consciousness Awake and Alert (Post-procedure): Post Debridement Measurements of Total Wound Length: (cm) 2.6 Width: (cm) 1.7 Depth: (cm) 0.2 Volume: (cm) 0.694 Character of Wound/Ulcer Post Improved Debridement: Severity of Tissue Post Debridement: Fat layer exposed Post Procedure Diagnosis Same as Pre-procedure Electronic Signature(s) Signed: 02/10/2019 6:12:06 PM By: Baruch Gouty RN, BSN Signed: 02/12/2019 8:54:13 AM By: Linton Ham MD Entered By: Linton Ham on 02/10/2019 14:23:34 -------------------------------------------------------------------------------- HPI Details Patient Name: Date of Service: Kelsey Joseph, Kelsey Joseph 02/10/2019 12:45 PM Medical Record [id number] Patient Account Number: [id number] Date of  Birth/Sex: Treating RN: Oct 11, 1927 (85 y.o. Elam Dutch Primary Care Provider: Alden Server Other Clinician: Referring Provider: Treating Provider/Extender:Jaslyn Bansal, Towana Badger, Rutherford Limerick in Treatment: 10 History of Present Illness HPI Description: 11/30/2018 patient presents today for initial evaluation in our clinic concerning issues that she is having with her left knee. She states that she does not really know of any specific injury that occurred and cause this ulcer. Nonetheless she does have a scratch on her leg from her dog but again she is not aware of her dog actually scratching her at this location. She does have a history of multiple knee replacements with the last being in 2006 I believe this was her third. She also has obviously a artificial knee joint on the left. She has hypertension, paroxysmal atrial fibrillation, congestive heart failure, and a right above-knee amputation. This was secondary to osteomyelitis. At this time patient is having no pain with regard to her knee at this point. Fortunately she is also having no signs of active infection at this time as far as I can see. 12/07/2018 upon evaluation today patient actually appears to be showing some signs of improvement which is good news. She still though having a little bit of granulation around the edge of the wound bed has not had as much of a improvement right in the base of the wound as I like to see this is still very dry. Nonetheless I believe that we may want to add hydrogel to the collagen in order to allow this to heal more appropriately. She is definitely in agreement with giving this a try. The good news is there does not appear to be any signs of infection. 9/17; patient have not seen previously. 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 She has had skin grafting and also an artificial knee joint  apparently from 2006 on the left. She has a small open area on the left lateral part of her  knee. The exact reason for this is unclear. We have been using silver collagen with some improvement initially but not since the last visit. She is unaware of any skin issues in this area before it open. She thinks she has had this for about 2 months 9/24 absolutely no change in the condition of this wound. Almost looks like a small crater although the patient was not aware of a skin condition in this area. We have been using silver collagen absolutely no change 10/2; pathology of the nodule that did open last time suggested damage scar tissue. Also suggested the possibility of a follicle or cyst. There was no malignancy. We have been using silver collagen 10/9; wound surface is slightly smaller but no suggestion of viable tissue. Still requiring debridement we are using Iodoflex 10/23; 2-week follow-up. Her wound is somewhat better in terms of the surface although quite a bit bigger. Because of the rapid expansion and surface area I have done a shave biopsy of this now large wound. This started as a small cavitating nodule. We have been using Iodoflex I changed her to [facility] today 11/6; biopsy I did last week was negative for malignancy. PAS stain was negative. We are using Hydrofera Blue. This was felt to be dermal scar Electronic Signature(s) Signed: 02/12/2019 8:54:13 AM By: Linton Ham MD Entered By: Linton Ham on 02/10/2019 15:01:10 -------------------------------------------------------------------------------- Physical Exam Details Patient Name: Date of Service: Kelsey Joseph, Kelsey Joseph 02/10/2019 12:45 PM Medical Record [id number] Patient Account Number: [id number] Date of Birth/Sex: Treating RN: December 21, 1927 (85 y.o. Elam Dutch Primary Care Provider: Alden Server Other Clinician: Referring Provider: Treating Provider/Extender:Naheem Mosco, Towana Badger, Rutherford Limerick in  Treatment: 10 Constitutional Sitting or standing Blood Pressure is within target range for patient.. Pulse regular and within target range for patient.Marland Kitchen Respirations regular, non-labored and within target range.. Temperature is normal and within the target range for the patient.Marland Kitchen Appears in no distress. Notes Wound exam; this is on the left lower lateral knee just lateral to the patellar tendon. In the middle of extensive scar tissue from previous skin grafting. Wound definitely is no bigger but looks healthier. Using a #3 curette removing necrotic fibrinous debris from the wound surface. Some of this is slightly hyper granulated. May need silver nitrate after the applicator Electronic Signature(s) Signed: 02/12/2019 8:54:13 AM By: Linton Ham MD Entered By: Linton Ham on 02/10/2019 14:25:51 -------------------------------------------------------------------------------- Physician Orders Details Patient Name: Date of Service: SATURN, HABER[place] 02/10/2019 12:45 PM Medical Record [id number] Patient Account Number: [id number] Date of Birth/Sex: Treating RN: [birth year] (85 y.o. [name] Primary Care Provider: Alden Server Other Clinician: Referring Provider: Treating Provider/Extender:Camry Robello, Towana Badger, Rutherford Limerick in Treatment: 10 Verbal / Phone Orders: No Diagnosis Coding ICD-10 Coding Code Description S81.002A Unspecified open wound, left knee, initial encounter L97.822 Non-pressure chronic ulcer of other part of left lower leg with fat layer exposed Z96.652 Presence of left artificial knee joint I10 Essential (primary) hypertension I48.0 Paroxysmal atrial fibrillation I50.42 Chronic combined systolic (congestive) and diastolic (congestive) heart failure Z89.611 Acquired absence of right leg above knee Follow-up Appointments Return Appointment in 2 weeks. Dressing Change Frequency Wound #1 Left Knee Change Dressing every other day. Wound  Cleansing Wound #1 Left Knee May shower and wash wound with soap and water. Primary Wound Dressing Wound #1 Left Knee Hydrofera Blue - classic - if available Secondary Dressing Wound #1 Left Knee Foam Border - or large bordered gauze. Edema Control Elevate legs to the level  of the heart or above for 30 minutes daily and/or when sitting, a frequency of: - throughout the day. Electronic Signature(s) Signed: 02/10/2019 6:04:56 PM By: Kela Millin Signed: 02/12/2019 8:54:13 AM By: Linton Ham MD Entered By: Kela Millin on 02/10/2019 13:46:46 -------------------------------------------------------------------------------- Problem List Details Patient Name: Date of Service: Kelsey Joseph, Kelsey Joseph 02/10/2019 12:45 PM Medical Record [id number] Patient Account Number: [id number] Date of Birth/Sex: Treating RN: November 22, 1927 (85 y.o. Hollie Salk, Larene Beach Primary Care Provider: Alden Server Other Clinician: Referring Provider: Treating Provider/Extender:Akeem Heppler, Towana Badger, Rutherford Limerick in Treatment: 10 Active Problems ICD-10 Evaluated Encounter Code Description Active Date Today Diagnosis S81.002A Unspecified open wound, left knee, initial encounter 11/30/2018 No Yes L97.822 Non-pressure chronic ulcer of other part of left lower 11/30/2018 No Yes leg with fat layer exposed Z96.652 Presence of left artificial knee joint 11/30/2018 No Yes I10 Essential (primary) hypertension 11/30/2018 No Yes I48.0 Paroxysmal atrial fibrillation 11/30/2018 No Yes I50.42 Chronic combined systolic (congestive) and diastolic [id number] No Yes (congestive) heart failure Z89.611 Acquired absence of right leg above knee 11/30/2018 No Yes Inactive Problems Resolved Problems Electronic Signature(s) Signed: 02/12/2019 8:54:13 AM By: Linton Ham MD Entered By: Linton Ham on 02/10/2019 14:23:15 -------------------------------------------------------------------------------- Progress Note  Details Patient Name: Date of Service: [name] 02/10/2019 12:45 PM Medical Record [id number] Patient Account Number: [id number] Date of Birth/Sex: Treating RN: 1927-09-20 (85 y.o. Elam Dutch Primary Care Provider: Alden Server Other Clinician: Referring Provider: Treating Provider/Extender:Zarah Carbon, Towana Badger, Rutherford Limerick in Treatment: 10 Subjective History of Present Illness (HPI) 11/30/2018 patient presents today for initial evaluation in our clinic concerning issues that she is having with her left knee. She states that she does not really know of any specific injury that occurred and cause this ulcer. Nonetheless she does have a scratch on her leg from her dog but again she is not aware of her dog actually scratching her at this location. She does have a history of multiple knee replacements with the last being in 2006 I believe this was her third. She also has obviously a artificial knee joint on the left. She has hypertension, paroxysmal atrial fibrillation, congestive heart failure, and a right above-knee amputation. This was secondary to osteomyelitis. At this time patient is having no pain with regard to her knee at this point. Fortunately she is also having no signs of active infection at this time as far as I can see. 12/07/2018 upon evaluation today patient actually appears to be showing some signs of improvement which is good news. She still though having a little bit of granulation around the edge of the wound bed has not had as much of a improvement right in the base of the wound as I like to see this is still very dry. Nonetheless I believe that we may want to add hydrogel to the collagen in order to allow this to heal more appropriately. She is definitely in agreement with giving this a try. The good news is there does not appear to be any signs of infection. 9/17; patient have not seen previously. 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 She has had skin grafting and also an artificial knee joint apparently from 2006 on the left. She has a small open area on the left lateral part of her knee. The exact reason for this is unclear. We have been using silver collagen with some improvement initially but not since  the last visit. She is unaware of any skin issues in this area before it open. She thinks she has had this for about 2 months 9/24 absolutely no change in the condition of this wound. Almost looks like a small crater although the patient was not aware of a skin condition in this area. We have been using silver collagen absolutely no change 10/2; pathology of the nodule that did open last time suggested damage scar tissue. Also suggested the possibility of a follicle or cyst. There was no malignancy. We have been using silver collagen 10/9; wound surface is slightly smaller but no suggestion of viable tissue. Still requiring debridement we are using Iodoflex 10/23; 2-week follow-up. Her wound is somewhat better in terms of the surface although quite a bit bigger. Because of the rapid expansion and surface area I have done a shave biopsy of this now large wound. This started as a small cavitating nodule. We have been using Iodoflex I changed her to [facility] today 11/6; biopsy I did last week was negative for malignancy. PAS stain was negative. We are using Hydrofera Blue. This was felt to be dermal scar Objective Constitutional Sitting or standing Blood Pressure is within target range for patient.. Pulse regular and within target range for patient.Marland Kitchen Respirations regular, non-labored and within target range.. Temperature is normal and within the target range for the patient.Marland Kitchen Appears in no distress. Vitals Time Taken: 1:17 PM, Height: 63 in, Weight: 122 lbs, BMI: 21.6, Temperature: 98.4 F, Pulse: 76 bpm, Respiratory Rate: 16  breaths/min, Blood Pressure: 107/45 mmHg. General Notes: Wound exam; this is on the left lower lateral knee just lateral to the patellar tendon. In the middle of extensive scar tissue from previous skin grafting. Wound definitely is no bigger but looks healthier. Using a #3 curette removing necrotic fibrinous debris from the wound surface. Some of this is slightly hyper granulated. May need silver nitrate after the applicator Integumentary (Hair, Skin) Wound #1 status is Open. Original cause of wound was Gradually Appeared. The wound is located on the Left Knee. The wound measures 2.6cm length x 1.7cm width x 0.2cm depth; 3.471cm^2 area and 0.694cm^3 volume. There is Fat Layer (Subcutaneous Tissue) Exposed exposed. There is no tunneling or undermining noted. There is a medium amount of serosanguineous drainage noted. The wound margin is epibole. There is medium (34-66%) red, pink granulation within the wound bed. There is a medium (34-66%) amount of necrotic tissue within the wound bed including Adherent Slough. 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 There was a Excisional Skin/Subcutaneous Tissue Debridement with a total area of 4.42 sq cm performed by Ricard Dillon., MD. With the following instrument(s): Curette to remove Viable and Non-Viable tissue/material. Material removed includes Subcutaneous Tissue and Slough and after achieving pain control using Other (benzocaine, 20%).  No specimens were taken. A time out was conducted at 13:44, prior to the start of the procedure. A Minimum amount of bleeding was controlled with Pressure. The procedure was tolerated well. Post Debridement Measurements: 2.6cm length x 1.7cm width x 0.2cm depth; 0.694cm^3 volume.  Character of Wound/Ulcer Post Debridement is improved. Severity of Tissue Post Debridement is: Fat layer exposed. Post procedure Diagnosis Wound #1: Same as Pre-Procedure Plan Follow-up Appointments: Return Appointment in 2 weeks. Dressing Change Frequency: Wound #1 Left Knee: Change Dressing every other day. Wound Cleansing: Wound #1 Left Knee: May shower and wash wound with soap and water. Primary Wound Dressing: Wound #1 Left Knee: Hydrofera Blue - classic - if available Secondary Dressing: Wound #1 Left Knee: Foam Border - or large bordered gauze. Edema Control: Elevate legs to the level of the heart or above for 30 minutes daily and/or when sitting, a frequency of: - throughout the day. 1. Continue with Hydrofera Blue and bordered foam after debridement. We appear to have a better looking wound surface. 2. May require silver nitrate next time for hyper granulation Electronic Signature(s) Signed: 02/10/2019 3:01:44 PM By: Linton Ham MD Entered By: Linton Ham on 02/10/2019 15:01:44 -------------------------------------------------------------------------------- SuperBill Details Patient Name: Date of Service: Kelsey Joseph, Kelsey Joseph 02/10/2019 Medical Record [id number] Patient Account Number: [id number] Date of Birth/Sex: Treating RN: 1927/12/16 (85 y.o. [name] Primary Care Provider: Alden Server Other Clinician: Referring Provider: Treating Provider/Extender:Elease Swarm, Towana Badger, Rutherford Limerick in Treatment: 10 Diagnosis Coding ICD-10 Codes Code Description S81.002A Unspecified open wound, left knee, initial encounter L97.822 Non-pressure chronic ulcer of other part  of left lower leg with fat layer exposed Z96.652 Presence of left artificial knee joint I10 Essential (primary) hypertension I48.0 Paroxysmal atrial fibrillation I50.42 Chronic combined systolic (congestive) and diastolic (congestive) heart failure Z89.611 Acquired absence of right leg above knee Facility Procedures The patient participates with Medicare or their insurance follows the Medicare Facility Guidelines: CPT4 Code Description Modifier Quantity [id number] - DEB SUBQ TISSUE 20 SQ CM/< 1 ICD-10 Diagnosis Description L97.822 Non-pressure chronic ulcer of  other part of left lower leg with fat layer exposed Physician Procedures CPT4 Code Description: [id number] [id number] - WC PHYS SUBQ TISS 20 SQ CM ICD-10 Diagnosis Description L97.822 Non-pressure chronic ulcer of other part of left lower leg w Modifier: ith fat laye Quantity: 1 r exposed Electronic Signature(s) Signed: 02/12/2019 8:54:13 AM By: Linton Ham MD Entered By: Linton Ham on 02/10/2019 14:26:44

## 2019-02-23 ENCOUNTER — Ambulatory Visit (INDEPENDENT_AMBULATORY_CARE_PROVIDER_SITE_OTHER): Payer: Medicare Other | Admitting: *Deleted

## 2019-02-23 DIAGNOSIS — I495 Sick sinus syndrome: Secondary | ICD-10-CM

## 2019-02-23 DIAGNOSIS — I471 Supraventricular tachycardia: Secondary | ICD-10-CM

## 2019-02-24 LAB — CUP PACEART REMOTE DEVICE CHECK
Battery Remaining Longevity: 56 mo
Battery Voltage: 2.99 V
Brady Statistic AP VP Percent: 2.96 %
Brady Statistic AP VS Percent: 96.33 %
Brady Statistic AS VP Percent: 0.03 %
Brady Statistic AS VS Percent: 0.68 %
Brady Statistic RA Percent Paced: 99.13 %
Brady Statistic RV Percent Paced: 3.18 %
Date Time Interrogation Session: 20201120111621
Implantable Lead Implant Date: 20151215
Implantable Lead Implant Date: 20151215
Implantable Lead Location: 753859
Implantable Lead Location: 753860
Implantable Lead Model: 5076
Implantable Lead Model: 5076
Implantable Pulse Generator Implant Date: 20151215
Lead Channel Impedance Value: 418 Ohm
Lead Channel Impedance Value: 437 Ohm
Lead Channel Impedance Value: 475 Ohm
Lead Channel Impedance Value: 551 Ohm
Lead Channel Pacing Threshold Amplitude: 0.875 V
Lead Channel Pacing Threshold Amplitude: 0.875 V
Lead Channel Pacing Threshold Pulse Width: 0.4 ms
Lead Channel Pacing Threshold Pulse Width: 0.4 ms
Lead Channel Sensing Intrinsic Amplitude: 1.875 mV
Lead Channel Sensing Intrinsic Amplitude: 1.875 mV
Lead Channel Sensing Intrinsic Amplitude: 19.5 mV
Lead Channel Sensing Intrinsic Amplitude: 19.5 mV
Lead Channel Setting Pacing Amplitude: 2 V
Lead Channel Setting Pacing Amplitude: 2 V
Lead Channel Setting Pacing Pulse Width: 0.4 ms
Lead Channel Setting Sensing Sensitivity: 2 mV

## 2019-02-27 ENCOUNTER — Encounter (HOSPITAL_BASED_OUTPATIENT_CLINIC_OR_DEPARTMENT_OTHER): Payer: Medicare Other | Admitting: Physician Assistant

## 2019-03-03 ENCOUNTER — Other Ambulatory Visit: Payer: Self-pay | Admitting: Cardiovascular Disease

## 2019-03-06 ENCOUNTER — Other Ambulatory Visit: Payer: Self-pay

## 2019-03-06 ENCOUNTER — Encounter (HOSPITAL_BASED_OUTPATIENT_CLINIC_OR_DEPARTMENT_OTHER): Payer: Medicare Other | Admitting: Internal Medicine

## 2019-03-06 DIAGNOSIS — I110 Hypertensive heart disease with heart failure: Secondary | ICD-10-CM | POA: Diagnosis not present

## 2019-03-06 DIAGNOSIS — I5042 Chronic combined systolic (congestive) and diastolic (congestive) heart failure: Secondary | ICD-10-CM | POA: Diagnosis not present

## 2019-03-06 DIAGNOSIS — E11622 Type 2 diabetes mellitus with other skin ulcer: Secondary | ICD-10-CM | POA: Diagnosis not present

## 2019-03-06 DIAGNOSIS — L97822 Non-pressure chronic ulcer of other part of left lower leg with fat layer exposed: Secondary | ICD-10-CM | POA: Diagnosis not present

## 2019-03-06 DIAGNOSIS — Z89611 Acquired absence of right leg above knee: Secondary | ICD-10-CM | POA: Diagnosis not present

## 2019-03-06 DIAGNOSIS — Z96652 Presence of left artificial knee joint: Secondary | ICD-10-CM | POA: Diagnosis not present

## 2019-03-06 NOTE — Progress Notes (Signed)
CHALEE, KILLEEN ([id number]) Visit Report for 03/06/2019 HPI Details Patient Name: Date of Service: ZELENA, BARIE 03/06/2019 12:30 PM Medical Record [id number] Patient Account Number: [id number] Date of Birth/Sex: Treating RN: [birth year] (85 y.o. F) Primary Care Provider: Alden Server Other Clinician: Referring Provider: Treating Provider/Extender:Waldo Damian, Towana Badger, Rutherford Limerick in Treatment: 13 History of Present Illness HPI Description: 11/30/2018 patient presents today for initial evaluation in our clinic concerning issues that she is having with her left knee. She states that she does not really know of any specific injury that occurred and cause this ulcer. Nonetheless she does have a scratch on her leg from her dog but again she is not aware of her dog actually scratching her at this location. She does have a history of multiple knee replacements with the last being in 2006 I believe this was her third. She also has obviously a artificial knee joint on the left. She has hypertension, paroxysmal atrial fibrillation, congestive heart failure, and a right above-knee amputation. This was secondary to osteomyelitis. At this time patient is having no pain with regard to her knee at this point. Fortunately she is also having no signs of active infection at this time as far as I can see. 12/07/2018 upon evaluation today patient actually appears to be showing some signs of improvement which is good news. She still though having a little bit of granulation around the edge of the wound bed has not had as much of a improvement right in the base of the wound as I like to see this is still very dry. Nonetheless I believe that we may want to add hydrogel to the collagen in order to allow this to heal more appropriately. She is definitely in agreement with giving this a try. The good news is there does not appear to be any signs of infection. 9/17; patient have not seen previously. 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 She has had skin grafting and also an artificial knee joint apparently from 2006 on the left. She has a small open area on the left lateral part of her knee. The exact reason for this is unclear. We have been using silver collagen with some improvement initially but not since the last visit. She is unaware of any skin issues in this area before it open. She thinks she has had this for about 2 months 9/24 absolutely no change in the condition of this wound. Almost looks like a small crater although the patient was not aware of a skin condition in this area. We have been using silver collagen absolutely no change 10/2; pathology of the nodule that did open last time suggested damage scar tissue. Also suggested the possibility of a follicle or cyst. There was no malignancy. We have been using silver collagen 10/9; wound surface is slightly smaller but no suggestion of viable tissue. Still requiring debridement we are using Iodoflex 10/23; 2-week follow-up. Her wound is somewhat better in terms of the surface although quite a bit bigger. Because of the rapid expansion and surface area I have done a shave biopsy of this now large wound. This started as a small cavitating nodule. We have been using Iodoflex I changed her to [facility] today 11/6; biopsy I did last week was negative for malignancy. PAS stain was negative. We are using Hydrofera Blue. This was felt to be dermal scar 11/30. Patient is using  Hydrofera Blue to this area in the middle of scar tissue on the left upper leg just below her knee. Wound slightly smaller. I have also been using silver nitrate on hyper granulation Electronic Signature(s) Signed: 03/06/2019 6:46:53 PM By: Linton Ham MD Entered By: Linton Ham on 03/06/2019  13:59:27 -------------------------------------------------------------------------------- Chemical Cauterization Details Patient Name: Date of Service: VALERYE, TINNER 03/06/2019 12:30 PM Medical Record [id number] Patient Account Number: [id number] Date of Birth/Sex: Treating RN: 07/14/1927 (85 y.o. F) Primary Care Provider: Alden Server Other Clinician: Referring Provider: Treating Provider/Extender:Jahking Lesser, Towana Badger, Rutherford Limerick in Treatment: 13 Procedure Performed for: Wound #1 Left Knee Performed By: Physician Ricard Dillon., MD Post Procedure Diagnosis Same as Pre-procedure Notes silver nitrate Electronic Signature(s) Signed: 03/06/2019 6:46:53 PM By: Linton Ham MD Entered By: Linton Ham on 03/06/2019 13:58:40 -------------------------------------------------------------------------------- Physical Exam Details Patient Name: Date of Service: REMEIGH, DITSWORTH 03/06/2019 12:30 PM Medical Record [id number] Patient Account Number: [id number] Date of Birth/Sex: Treating RN: 10-17-1927 (85 y.o. F) Primary Care Provider: Alden Server Other Clinician: Referring Provider: Treating Provider/Extender:Tytianna Greenley, Towana Badger, Rutherford Limerick in Treatment: 13 Constitutional Sitting or standing Blood Pressure is within target range for patient.. Pulse regular and within target range for patient.Marland Kitchen Respirations regular, non-labored and within target range.. Temperature is normal and within the target range for the patient.Marland Kitchen Appears in no distress. Notes Exam; this is on the left lateral knee just lateral to the patellar tendon. This in the middle of scar tissue from previous skin grafting. Wound is somewhat smaller still hyper granulation. I used silver nitrate to not back some of the hyper granulation from the circumference of the wound hopefully to allow progressive epithelialization. Electronic Signature(s) Signed: 03/06/2019 6:46:53 PM By:  Linton Ham MD Entered By: Linton Ham on 03/06/2019 14:00:23 -------------------------------------------------------------------------------- Physician Orders Details Patient Name: Date of Service: NOVELLE, RAWLINGS 03/06/2019 12:30 PM Medical Record [id number] Patient Account Number: [id number] Date of Birth/Sex: Treating RN: 1927/08/12 (85 y.o. [name] Primary Care Provider: Alden Server Other Clinician: Referring Provider: Treating Provider/Extender:Nea Gittens, Towana Badger, Rutherford Limerick in Treatment: 13 Verbal / Phone Orders: No Diagnosis Coding ICD-10 Coding Code Description S81.002A Unspecified open wound, left knee, initial encounter L97.822 Non-pressure chronic ulcer of other part of left lower leg with fat layer exposed Z96.652 Presence of left artificial knee joint I10 Essential (primary) hypertension I48.0 Paroxysmal atrial fibrillation I50.42 Chronic combined systolic (congestive) and diastolic (congestive) heart failure Z89.611 Acquired absence of right leg above knee Follow-up Appointments Return Appointment in 2 weeks. Dressing Change Frequency Wound #1 Left Knee Change Dressing every other day. Wound Cleansing Wound #1 Left Knee May shower and wash wound with soap and water. Primary Wound Dressing Wound #1 Left Knee Hydrofera Blue - classic - if available Secondary Dressing Wound #1 Left Knee Foam Border - or large bordered gauze. Edema Control Elevate legs to the level of the heart or above for 30 minutes daily and/or when sitting, a frequency of: - throughout the day. Electronic Signature(s) Signed: 03/06/2019 6:27:46 PM By: Levan Hurst RN, BSN Signed: 03/06/2019 6:46:53 PM By: Linton Ham MD Entered By: Levan Hurst on 03/06/2019 13:43:36 -------------------------------------------------------------------------------- Problem List Details Patient Name: Date of Service: SHAWNDELL, SALIB 03/06/2019 12:30 PM Medical  Record [id number] Patient Account Number: [id number] Date of Birth/Sex: Treating RN: [birth year] (85 y.o. [name] Primary Care Provider: Alden Server Other Clinician: Referring Provider: Treating Provider/Extender:Hameed Kolar, Towana Badger, Rutherford Limerick in Treatment: 13 Active Problems ICD-10 Evaluated  Encounter Code Description Active Date Today Diagnosis S81.002A Unspecified open wound, left knee, initial encounter 11/30/2018 No Yes L97.822 Non-pressure chronic ulcer of other part of left lower 11/30/2018 No Yes leg with fat layer exposed Z96.652 Presence of left artificial knee joint 11/30/2018 No Yes I10 Essential (primary) hypertension 11/30/2018 No Yes I48.0 Paroxysmal atrial fibrillation 11/30/2018 No Yes I50.42 Chronic combined systolic (congestive) and diastolic [id number] No Yes (congestive) heart failure Z89.611 Acquired absence of right leg above knee 11/30/2018 No Yes Inactive Problems Resolved Problems Electronic Signature(s) Signed: 03/06/2019 6:46:53 PM By: Linton Ham MD Entered By: Linton Ham on 03/06/2019 13:58:23 -------------------------------------------------------------------------------- Progress Note Details Patient Name: Date of Service: LATAZIA, CHAMBER 03/06/2019 12:30 PM Medical Record [id number] Patient Account Number: [id number] Date of Birth/Sex: Treating RN: [birth year] (85 y.o. F) Primary Care Provider: Alden Server Other Clinician: Referring Provider: Treating Provider/Extender:Hosie Sharman, Towana Badger, Rutherford Limerick in Treatment: 13 Subjective History of Present Illness (HPI) 11/30/2018 patient presents today for initial evaluation in our clinic concerning issues that she is having with her left knee. She states that she does not really know of any specific injury that occurred and cause this ulcer. Nonetheless she does have a scratch on her leg from her dog but again she is not aware of her dog actually scratching  her at this location. She does have a history of multiple knee replacements with the last being in 2006 I believe this was her third. She also has obviously a artificial knee joint on the left. She has hypertension, paroxysmal atrial fibrillation, congestive heart failure, and a right above-knee amputation. This was secondary to osteomyelitis. At this time patient is having no pain with regard to her knee at this point. Fortunately she is also having no signs of active infection at this time as far as I can see. 12/07/2018 upon evaluation today patient actually appears to be showing some signs of improvement which is good news. She still though having a little bit of granulation around the edge of the wound bed has not had as much of a improvement right in the base of the wound as I like to see this is still very dry. Nonetheless I believe that we may want to add hydrogel to the collagen in order to allow this to heal more appropriately. She is definitely in agreement with giving this a try. The good news is there does not appear to be any signs of infection. 9/17; patient have not seen previously. 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 She has had skin grafting and also an artificial knee joint apparently from 2006 on the left. She has a small open area on the left lateral part of her knee. The exact reason for this is unclear. We have been using silver collagen with some improvement initially but not since the last visit. She is unaware of any skin issues in this area before it open. She thinks she has had this for about 2 months 9/24 absolutely no change in the condition of this wound. Almost looks like a small crater although the patient was not aware of a skin condition in this area. We have been using silver collagen absolutely no change 10/2; pathology of the nodule that did open last time  suggested damage scar tissue. Also suggested the possibility of a follicle or cyst. There was no malignancy. We have been using silver collagen 10/9; wound surface is slightly  smaller but no suggestion of viable tissue. Still requiring debridement we are using Iodoflex 10/23; 2-week follow-up. Her wound is somewhat better in terms of the surface although quite a bit bigger. Because of the rapid expansion and surface area I have done a shave biopsy of this now large wound. This started as a small cavitating nodule. We have been using Iodoflex I changed her to [facility] today 11/6; biopsy I did last week was negative for malignancy. PAS stain was negative. We are using Hydrofera Blue. This was felt to be dermal scar 11/30. Patient is using Hydrofera Blue to this area in the middle of scar tissue on the left upper leg just below her knee. Wound slightly smaller. I have also been using silver nitrate on hyper granulation Objective Constitutional Sitting or standing Blood Pressure is within target range for patient.. Pulse regular and within target range for patient.Marland Kitchen Respirations regular, non-labored and within target range.. Temperature is normal and within the target range for the patient.Marland Kitchen Appears in no distress. Vitals Time Taken: 1:07 PM, Height: 63 in, Weight: 122 lbs, BMI: 21.6, Temperature: 98.4 F, Pulse: 70 bpm, Respiratory Rate: 18 breaths/min, Blood Pressure: 123/65 mmHg. General Notes: Exam; this is on the left lateral knee just lateral to the patellar tendon. This in the middle of scar tissue from previous skin grafting. Wound is somewhat smaller still hyper granulation. I used silver nitrate to not back some of the hyper granulation from the circumference of the wound hopefully to allow progressive epithelialization. Integumentary (Hair, Skin) Wound #1 status is Open. Original cause of wound was Gradually Appeared. The wound is located on the Left Knee. The wound measures  2.3cm length x 1.7cm width x 0.3cm depth; 3.071cm^2 area and 0.921cm^3 volume. There is Fat Layer (Subcutaneous Tissue) Exposed exposed. There is no tunneling or undermining noted. There is a medium amount of serosanguineous drainage noted. The wound margin is epibole. There is large (67-100%) red, pink, pale granulation within the wound bed. There is a small (1-33%) amount of necrotic tissue within the wound bed including Adherent Slough. Assessment Active Problems ICD-10 Unspecified open wound, left knee, initial encounter Non-pressure chronic ulcer of other part of left lower leg with fat layer exposed Presence of left artificial knee joint Essential (primary) hypertension Paroxysmal atrial fibrillation Chronic combined systolic (congestive) and diastolic (congestive) heart failure Acquired absence of right leg above knee Procedures Wound #1 Pre-procedure diagnosis of Wound #1 is a Trauma, Other located on the Left Knee . An Chemical Cauterization procedure was performed by Ricard Dillon., MD. Post procedure Diagnosis Wound #1: Same as Pre-Procedure Notes: silver nitrate Plan Follow-up Appointments: Return Appointment in 2 weeks. Dressing Change Frequency: Wound #1 Left Knee: Change Dressing every other day. Wound Cleansing: Wound #1 Left Knee: May shower and wash wound with soap and water. Primary Wound Dressing: Wound #1 Left Knee: Hydrofera Blue - classic - if available Secondary Dressing: Wound #1 Left Knee: Foam Border - or large bordered gauze. Edema Control: Elevate legs to the level of the heart or above for 30 minutes daily and/or when sitting, a frequency of: - throughout the day. 1. I am continuing with Hydrofera Blue to the left knee. Electronic Signature(s) Signed: 03/06/2019 6:46:53 PM By: Linton Ham MD Entered By: Linton Ham on 03/06/2019 14:00:52 -------------------------------------------------------------------------------- SuperBill  Details Patient Name: Date of Service: SIERRAH, ALESSANDRO 03/06/2019 Medical Record [id number] Patient Account Number: [id number] Date of Birth/Sex: Treating RN: Jan 10, 1928 (85 y.o. F) Primary Care Provider: [name]  D Other Clinician: Referring Provider: Treating Provider/Extender:Homer Pfeifer, Towana Badger, Lynnell Chad Weeks in Treatment: 13 Diagnosis Coding ICD-10 Codes Code Description S81.002A Unspecified open wound, left knee, initial encounter L97.822 Non-pressure chronic ulcer of other part of left lower leg with fat layer exposed Z96.652 Presence of left artificial knee joint I10 Essential (primary) hypertension I48.0 Paroxysmal atrial fibrillation I50.42 Chronic combined systolic (congestive) and diastolic (congestive) heart failure Z89.611 Acquired absence of right leg above knee Facility Procedures The patient participates with Medicare or their insurance follows the Medicare Facility Guidelines: CPT4 Code Description Modifier Quantity [id number] - CHEM CAUT GRANULATION TISS 1 ICD-10 Diagnosis Description L97.822 Non-pressure chronic ulcer of  other part of left lower leg with fat layer exposed Physician Procedures CPT4 Code Description: [id number] - WC PHYS CHEM CAUT GRAN TISSUE ICD-10 Diagnosis Description L97.822 Non-pressure chronic ulcer of other part of left lower leg Modifier: with fat laye Quantity: 1 r exposed Electronic Signature(s) Signed: 03/06/2019 6:46:53 PM By: Linton Ham MD Entered By: Linton Ham on 03/06/2019 14:01:55

## 2019-03-09 DIAGNOSIS — M75101 Unspecified rotator cuff tear or rupture of right shoulder, not specified as traumatic: Secondary | ICD-10-CM | POA: Diagnosis not present

## 2019-03-14 NOTE — Progress Notes (Signed)
Kelsey Joseph, Kelsey Joseph ([id number]) Visit Report for 01/13/2019 Arrival Information Details Patient Name: Date of Service: Kelsey Joseph, Kelsey Joseph 01/13/2019 12:45 PM Medical Record [id number] Patient Account Number: [id number] Date of Birth/Sex: Treating RN: [birth year] (85 y.o. Orvan Falconer Primary Care Betania Dizon: Alden Server Other Clinician: Referring Janavia Rottman: Treating Austen Wygant/Extender:Robson, Towana Badger, Rutherford Limerick in Treatment: 6 Visit Information History Since Last Visit Other All ordered tests and consults were completed: No Patient Arrived: 13:10 Added or deleted any medications: No Arrival Time: Any new allergies or adverse reactions: No Accompanied By: self None Had a fall or experienced change in No Transfer Assistance: activities of daily living that may affect Patient Identification Verified: Yes risk of falls: Secondary Verification Process Completed: Yes Signs or symptoms of abuse/neglect since last No Patient Requires Transmission-Based No visito Precautions: Hospitalized since last visit: No Patient Has Alerts: No Implantable device outside of the clinic excluding No cellular tissue based products placed in the center since last visit: Has Dressing in Place as Prescribed: Yes Pain Present Now: No Electronic Signature(s) Signed: 03/14/2019 2:59:38 PM By: Carlene Coria RN Entered By: Carlene Coria on 01/13/2019 13:10:50 -------------------------------------------------------------------------------- Encounter Discharge Information Details Patient Name: Date of Service: Kelsey Joseph 01/13/2019 12:45 PM Medical Record [id number] Patient Account Number: [id number] Date of Birth/Sex: Treating RN: 1927/07/11 (85 y.o. Debby Bud Primary Care Sevannah Madia: Alden Server Other Clinician: Referring Lindbergh Winkles: Treating Rena Sweeden/Extender:Robson, Towana Badger, Rutherford Limerick in Treatment: 6 Encounter Discharge Information Items Post Procedure  Vitals Discharge Condition: Stable Temperature (F): 98.1 Ambulatory Status: Wheelchair Pulse (bpm): 97 Discharge Destination: Home Respiratory Rate (breaths/min): 18 Transportation: Private Auto Blood Pressure (mmHg): 117/81 Accompanied By: self Schedule Follow-up Appointment: Yes Clinical Summary of Care: Electronic Signature(s) Signed: 01/13/2019 6:01:54 PM By: Deon Pilling Entered By: Deon Pilling on 01/13/2019 14:21:19 -------------------------------------------------------------------------------- Lower Extremity Assessment Details Patient Name: Date of Service: Kelsey Joseph, Kelsey Joseph 01/13/2019 12:45 PM Medical Record [id number] Patient Account Number: [id number] Date of Birth/Sex: Treating RN: 1927-06-06 (85 y.o. Orvan Falconer Primary Care Wymon Swaney: Alden Server Other Clinician: Referring Zenaya Ulatowski: Treating Merissa Renwick/Extender:Robson, Towana Badger, Rutherford Limerick in Treatment: 6 Edema Assessment Assessed: [Left: No] [Right: No] Edema: [Left: N] [Right: o] Calf Left: Right: Point of Measurement: 37 cm From Medial Instep 31 cm cm Ankle Left: Right: Point of Measurement: 10 cm From Medial Instep 21 cm cm Electronic Signature(s) Signed: 03/14/2019 2:59:38 PM By: Carlene Coria RN Entered By: Carlene Coria on 01/13/2019 13:12:09 -------------------------------------------------------------------------------- Multi-Disciplinary Care Plan Details Patient Name: Date of Service: Kelsey Joseph, Kelsey Joseph 01/13/2019 12:45 PM Medical Record [id number] Patient Account Number: [id number] Date of Birth/Sex: Treating RN: 01-20-1928 (85 y.o. [name] Primary Care Ashrita Chrismer: Alden Server Other Clinician: Referring Langford Carias: Treating Willi Borowiak/Extender:Robson, Towana Badger, Rutherford Limerick in Treatment: 6 Active Inactive Wound/Skin Impairment Nursing Diagnoses: Impaired tissue integrity Knowledge deficit related to ulceration/compromised skin  integrity Goals: Patient/caregiver will verbalize understanding of skin care regimen Date Initiated: 11/30/2018 Target Resolution Date: 01/27/2019 Goal Status: Active Ulcer/skin breakdown will have a volume reduction of 30% by week 4 Date Initiated: 11/30/2018 Date Inactivated: 01/06/2019 Target Resolution Date: 12/29/2018 Unmet Reason: debrided Goal Status: Unmet undermining Ulcer/skin breakdown will have a volume reduction of 50% by week 8 Date Initiated: 01/06/2019 Target Resolution Date: 01/27/2019 Goal Status: Active Interventions: Assess patient/caregiver ability to obtain necessary supplies Assess patient/caregiver ability to perform ulcer/skin care regimen upon admission and as needed Assess ulceration(s) every visit Provide education on ulcer and skin care Treatment Activities: Skin care regimen  initiated : 11/30/2018 Topical wound management initiated : 11/30/2018 Notes: Electronic Signature(s) Signed: 01/17/2019 5:52:09 PM By: Levan Hurst RN, BSN Entered By: Levan Hurst on 01/13/2019 13:19:42 -------------------------------------------------------------------------------- Pain Assessment Details Patient Name: Date of Service: Kelsey Joseph, Kelsey Joseph 01/13/2019 12:45 PM Medical Record [id number] Patient Account Number: [id number] Date of Birth/Sex: Treating RN: 28-Jul-1927 (85 y.o. Orvan Falconer Primary Care Chaniyah Jahr: Alden Server Other Clinician: Referring Jencarlo Bonadonna: Treating May Ozment/Extender:Robson, Towana Badger, Rutherford Limerick in Treatment: 6 Active Problems Location of Pain Severity and Description of Pain Patient Has Paino No Site Locations Pain Management and Medication Current Pain Management: Electronic Signature(s) Signed: 03/14/2019 2:59:38 PM By: Carlene Coria RN Entered By: Carlene Coria on 01/13/2019 13:12:01 -------------------------------------------------------------------------------- Patient/Caregiver Education Details Patient Name: Date  of Service: Kelsey Joseph 10/9/2020andnbsp12:45 PM Medical Record [id number] Patient Account Number: [id number] Date of Birth/Gender: Treating RN: 03/06/1928 (85 y.o. [name] Primary Care Physician: Alden Server Other Clinician: Referring Physician: Treating Physician/Extender:Robson, Towana Badger, Rutherford Limerick in Treatment: 6 Education Assessment Education Provided To: Patient Education Topics Provided Wound/Skin Impairment: Methods: Explain/Verbal Responses: State content correctly Electronic Signature(s) Signed: 01/17/2019 5:52:09 PM By: Levan Hurst RN, BSN Entered By: Levan Hurst on 01/13/2019 13:19:51 -------------------------------------------------------------------------------- Wound Assessment Details Patient Name: Date of Service: Kelsey Joseph, Kelsey Joseph 01/13/2019 12:45 PM Medical Record [id number] Patient Account Number: [id number] Date of Birth/Sex: Treating RN: 11-25-1927 (85 y.o. Orvan Falconer Primary Care Kohlton Gilpatrick: Alden Server Other Clinician: Referring Akshara Blumenthal: Treating Kimbrely Buckel/Extender:Robson, Towana Badger, Rutherford Limerick in Treatment: 6 Wound Status Wound Number: 1 Primary Trauma, Other Etiology: Wound Location: Left Knee Secondary Diabetic Wound/Ulcer of the Lower Wounding Event: Gradually Appeared Etiology: Extremity Date Acquired: 09/09/2018 Wound Open Weeks Of Treatment: 6 Status: Clustered Wound: No Comorbid Sleep Apnea, Congestive Heart Failure, History: Coronary Artery Disease, Type II Diabetes, Osteomyelitis, Neuropathy Photos Wound Measurements Length: (cm) 2.1 % Reduc Width: (cm) 1.5 % Reduc Depth: (cm) 0.1 Epithel Area: (cm) 2.474 Tunnel Volume: (cm) 0.247 Underm Wound Description Full Thickness Without Exposed Support Foul O Classification: Structures Slough Wound Flat and Intact Margin: Exudate Medium Amount: Exudate Serosanguineous Type: Exudate red, brown Color: Wound  Bed Granulation Amount: Small (1-33%) Granulation Quality: Pink Fascia Necrotic Amount: Large (67-100%) Fat Lay Necrotic Quality: Adherent Slough Tendon Muscle Expo Joint Expos Bone Expose dor After Cleansing: No /Fibrino Yes Exposed Structure Exposed: No er (Subcutaneous Tissue) Exposed: Yes Exposed: No sed: No ed: No d: No tion in Area: -799.6% tion in Volume: -349.1% ialization: None ing: No ining: No Electronic Signature(s) Signed: 01/16/2019 3:43:33 PM By: Mikeal Hawthorne EMT/HBOT Signed: 03/14/2019 2:59:38 PM By: Carlene Coria RN Entered By: Mikeal Hawthorne on 01/16/2019 08:45:43 -------------------------------------------------------------------------------- Vitals Details Patient Name: Date of Service: Kelsey Joseph 01/13/2019 12:45 PM Medical Record [id number] Patient Account Number: [id number] Date of Birth/Sex: Treating RN: Feb 09, 1928 (85 y.o. Orvan Falconer Primary Care Hawthorne Day: Alden Server Other Clinician: Referring Lissa Rowles: Treating Ariane Ditullio/Extender:Robson, Towana Badger, Rutherford Limerick in Treatment: 6 Vital Signs Time Taken: 13:10 Temperature (F): 98.1 Height (in): 63 Pulse (bpm): 97 Weight (lbs): 122 Respiratory Rate (breaths/min): 18 Body Mass Index (BMI): 21.6 Blood Pressure (mmHg): 117/81 Reference Range: 80 - 120 mg / dl Electronic Signature(s) Signed: 03/14/2019 2:59:38 PM By: Carlene Coria RN Entered By: Carlene Coria on 01/13/2019 13:11:54

## 2019-03-15 NOTE — Progress Notes (Signed)
Kelsey Joseph ([id number]) Visit Report for 03/06/2019 Arrival Information Details Patient Name: Date of Service: Kelsey Joseph, Kelsey Joseph 03/06/2019 12:30 PM Medical Record [id number] Patient Account Number: [id number] Date of Birth/Sex: Treating RN: [birth year] (85 y.o. Orvan Falconer Primary Care Johntay Doolen: Alden Server Other Clinician: Referring Malissie Musgrave: Treating Kimiye Strathman/Extender:Robson, Towana Badger, Rutherford [place] in Treatment: 13 Visit Information History Since Last Visit Other All ordered tests and consults were completed: No Patient Arrived: 13:06 Added or deleted any medications: No Arrival Time: Any new allergies or adverse reactions: No Accompanied By: self None Had a fall or experienced change in No Transfer Assistance: activities of daily living that may affect Patient Identification Verified: Yes risk of falls: Secondary Verification Process Completed: Yes Signs or symptoms of abuse/neglect since last No Patient Requires Transmission-Based No visito Precautions: Hospitalized since last visit: No Patient Has Alerts: No Implantable device outside of the clinic excluding No cellular tissue based products placed in the center since last visit: Has Dressing in Place as Prescribed: Yes Pain Present Now: No Electronic Signature(s) Signed: 03/14/2019 2:56:39 PM By: Carlene Coria RN Entered By: Carlene Coria on 03/06/2019 13:06:42 -------------------------------------------------------------------------------- Encounter Discharge Information Details Patient Name: Date of Service: Kelsey Joseph 03/06/2019 12:30 PM Medical Record [id number] Patient Account Number: [id number] Date of Birth/Sex: Treating RN: 05-Feb-1928 (85 y.o. Debby Bud Primary Care Beautifull Cisar: Alden Server Other Clinician: Referring Jahmya Onofrio: Treating Shakyra Mattera/Extender:Robson, Towana Badger, Rutherford Limerick in Treatment: 13 Encounter Discharge Information Items Discharge  Condition: Stable Ambulatory Status: Wheelchair Discharge Destination: Home Transportation: Private Auto Accompanied By: self Schedule Follow-up Appointment: Yes Clinical Summary of Care: Electronic Signature(s) Signed: 03/06/2019 6:19:57 PM By: Deon Pilling Entered By: Deon Pilling on 03/06/2019 16:17:07 -------------------------------------------------------------------------------- Lower Extremity Assessment Details Patient Name: Date of Service: ZIAN, MARINER 03/06/2019 12:30 PM Medical Record [id number] Patient Account Number: [id number] Date of Birth/Sex: Treating RN: [birth year] (85 y.o. Orvan Falconer Primary Care Jaydenn Boccio: Alden Server Other Clinician: Referring Elfriede Bonini: Treating Samarah Hogle/Extender:Robson, Towana Badger, Rutherford [place] in Treatment: 13 Edema Assessment Assessed: [Left: No] [Right: No] Edema: [Left: N] [Right: o] Calf Left: Right: Point of Measurement: 37 cm From Medial Instep 30 cm cm Ankle Left: Right: Point of Measurement: 10 cm From Medial Instep 21 cm cm Electronic Signature(s) Signed: 03/14/2019 2:56:39 PM By: Carlene Coria RN Entered By: Carlene Coria on 03/06/2019 13:10:30 -------------------------------------------------------------------------------- Multi Wound Chart Details Patient Name: Date of Service: Kelsey Joseph 03/06/2019 12:30 PM Medical Record [id number] Patient Account Number: [id number] Date of Birth/Sex: Treating RN: 19-Jul-1927 (85 y.o. F) Primary Care Arhaan Chesnut: Alden Server Other Clinician: Referring Labradford Schnitker: Treating Cuma Polyakov/Extender:Robson, Towana Badger, Rutherford Limerick in Treatment: 13 Vital Signs Height(in): 63 Pulse(bpm): 70 Weight(lbs): 122 Blood Pressure(mmHg): 123/65 Body Mass Index(BMI): 22 Temperature(F): 98.4 Respiratory 18 Rate(breaths/min): Photos: [1:No Photos] [N/A:N/A] Wound Location: [1:Left Knee] [N/A:N/A] Wounding Event: [1:Gradually Appeared] [N/A:N/A] Primary  Etiology: [1:Trauma, Other] [N/A:N/A] Secondary Etiology: [1:Diabetic Wound/Ulcer of the N/A Lower Extremity] Comorbid History: [1:Sleep Apnea, Congestive N/A Heart Failure, Coronary Artery Disease, Type II Diabetes, Osteomyelitis, Neuropathy] Date Acquired: [1:09/09/2018] [N/A:N/A] Weeks of Treatment: [1:13] [N/A:N/A] Wound Status: [1:Open] [N/A:N/A] Measurements L x W x D 2.3x1.7x0.3 [N/A:N/A] (cm) Area (cm) : [1:3.071] [N/A:N/A] Volume (cm) : [1:0.921] [N/A:N/A] % Reduction in Area: [1:-1016.70%] [N/A:N/A] % Reduction in Volume: -1574.50% [N/A:N/A] Classification: [1:Full Thickness Without Exposed Support Structures] [N/A:N/A] Exudate Amount: [1:Medium] [N/A:N/A] Exudate Type: [1:Serosanguineous] [N/A:N/A] Exudate Color: [1:red, brown] [N/A:N/A] Wound Margin: [1:Epibole] [N/A:N/A] Granulation Amount: [1:Large (67-100%)] [N/A:N/A] Granulation Quality: [1:Red, Pink,  Pale] [N/A:N/A] Necrotic Amount: [1:Small (1-33%)] [N/A:N/A] Exposed Structures: [1:Fat Layer (Subcutaneous N/A Tissue) Exposed: Yes Fascia: No Tendon: No Muscle: No Joint: No Bone: No] Epithelialization: [1:Small (1-33%)] [N/A:N/A N/A] Treatment Notes Electronic Signature(s) Signed: 03/06/2019 6:46:53 PM By: Linton Ham MD Entered By: Linton Ham on 03/06/2019 13:58:30 -------------------------------------------------------------------------------- Multi-Disciplinary Care Plan Details Patient Name: Date of Service: Kelsey Joseph 03/06/2019 12:30 PM Medical Record [id number] Patient Account Number: [id number] Date of Birth/Sex: Treating RN: 1928/02/23 (85 y.o. [name] Primary Care Rajesh Wyss: Alden Server Other Clinician: Referring Miia Blanks: Treating Christropher Gintz/Extender:Robson, Towana Badger, Rutherford [place] in Treatment: 13 Active Inactive Wound/Skin Impairment Nursing Diagnoses: Impaired tissue integrity Knowledge deficit related to ulceration/compromised skin  integrity Goals: Patient/caregiver will verbalize understanding of skin care regimen Date Initiated: 11/30/2018 Target Resolution Date: 04/07/2019 Goal Status: Active Ulcer/skin breakdown will have a volume reduction of 30% by week 4 Date Initiated: 11/30/2018 Date Inactivated: 01/06/2019 Target Resolution Date: 12/29/2018 Unmet Reason: debrided Goal Status: Unmet undermining Ulcer/skin breakdown will have a volume reduction of 50% by week 8 Date Inactivated: 01/27/2019 Target10/23/2020 Resolution Date Initiated: 01/06/2019 Date: Unmet Reason: multiple Goal Status: Unmet debridements, atypical wound Interventions: Assess patient/caregiver ability to obtain necessary supplies Assess patient/caregiver ability to perform ulcer/skin care regimen upon admission and as needed Assess ulceration(s) every visit Provide education on ulcer and skin care Treatment Activities: Skin care regimen initiated : 11/30/2018 Topical wound management initiated : 11/30/2018 Notes: Electronic Signature(s) Signed: 03/06/2019 6:27:46 PM By: Levan Hurst RN, BSN Entered By: Levan Hurst on 03/06/2019 13:43:49 -------------------------------------------------------------------------------- Pain Assessment Details Patient Name: Date of Service: KISHANA, STINCHFIELD 03/06/2019 12:30 PM Medical Record [id number] Patient Account Number: [id number] Date of Birth/Sex: Treating RN: 07-11-1927 (85 y.o. Orvan Falconer Primary Care Oletta Buehring: Alden Server Other Clinician: Referring Morgen Linebaugh: Treating Kolyn Rozario/Extender:Robson, Towana Badger, Rutherford [place]imerick in Treatment: 13 Active Problems Location of Pain Severity and Description of Pain Patient Has Paino No Site Locations Pain Management and Medication Current Pain Management: Electronic Signature(s) Signed: 03/14/2019 2:56:39 PM By: Carlene Coria RN Entered By: Carlene Coria on 03/06/2019  13:07:27 -------------------------------------------------------------------------------- Patient/Caregiver Education Details Patient Name: Date of Service: Kelsey Joseph 11/30/2020andnbsp12:30 PM Medical Record [id number] Patient Account Number: [id number] Date of Birth/Gender: Treating RN: November 19, 1927 (85 y.o. [name] Primary Care Physician: Alden Server Other Clinician: Referring Physician: Treating Physician/Extender:Robson, Towana Badger, Rutherford [place] in Treatment: 13 Education Assessment Education Provided To: Patient Education Topics Provided Wound/Skin Impairment: Methods: Explain/Verbal Responses: State content correctly [employer]) Signed: 03/06/2019 6:27:46 PM By: Levan Hurst RN, BSN Entered By: Levan Hurst on 03/06/2019 13:44:13 -------------------------------------------------------------------------------- Wound Assessment Details Patient Name: Date of Service: CRETA, BARTCH 03/06/2019 12:30 PM Medical Record [id number] Patient Account Number: [id number] Date of Birth/Sex: Treating RN: January 28, 1928 (85 y.o. [name] Primary Care Kimberlee Shoun: Alden Server Other Clinician: Referring Nyliah Nierenberg: Treating Muneer Leider/Extender:Robson, Towana Badger, Rutherford [place] in Treatment: 13 Wound Status Wound Number: 1 Primary Trauma, Other Etiology: Wound Location: Left Knee Secondary Diabetic Wound/Ulcer of the Lower Extremity Wounding Event: Gradually Appeared Etiology: Date Acquired: 09/09/2018 Wound Open Weeks Of Treatment: 13 Status: Clustered Wound: No Comorbid Sleep Apnea, Congestive Heart Failure, History: Coronary Artery Disease, Type II Diabetes, Osteomyelitis, Neuropathy Photos Wound Measurements Length: (cm) 2.3 % Reduc Width: (cm) 1.7 % Reduc Depth: (cm) 0.3 Epithel Area: (cm) 3.071 Tunnel Volume: (cm) 0.921 Underm Wound Description Classification: Full Thickness Without Exposed Support Foul  O Structures Slough Wound Epibole Epibole Margin: Exudate Medium Amount: Exudate Serosanguineous Type: Exudate  red, brown Color: Wound Bed Granulation Amount: Large (67-100%) Granulation Quality: Red, Pink, Pale Fascia Ex Necrotic Amount: Small (1-33%) Fat Layer Necrotic Quality: Adherent Slough Tendon Ex Muscle Ex Joint Exp Bone Expo dor After Cleansing: No /Fibrino Yes Exposed Structure posed: No (Subcutaneous Tissue) Exposed: Yes posed: No posed: No osed: No sed: No tion in Area: -1016.7% tion in Volume: -1574.5% ialization: Small (1-33%) ing: No ining: No Treatment Notes Wound #1 (Left Knee) 1. Cleanse With Wound Cleanser 2. Periwound Care Skin Prep 3. Primary Dressing Applied Hydrofera Blue 4. Secondary Dressing Foam Border Dressing 5. Secured With Self Adhesive Bandage Notes hydrofera blue classic used. Electronic Signature(s) Signed: 03/14/2019 2:54:39 PM By: Mikeal Hawthorne EMT/HBOT Signed: 03/15/2019 12:09:50 PM By: Levan Hurst RN, BSN Previous Signature: 03/06/2019 6:27:46 PM Version By: Levan Hurst RN, BSN Entered By: Mikeal Hawthorne on 03/13/2019 12:30:59 -------------------------------------------------------------------------------- Vitals Details Patient Name: Date of Service: SI, FORBES 03/06/2019 12:30 PM Medical Record [id number] Patient Account Number: [id number] Date of Birth/Sex: Treating RN: 1927/08/27 (85 y.o. Orvan Falconer Primary Care Michella Detjen: Alden Server Other Clinician: Referring Elanda Garmany: Treating Ange Puskas/Extender:Robson, Towana Badger, Rutherford [place] in Treatment: 13 Vital Signs Time Taken: 13:07 Temperature (F): 98.4 Height (in): 63 Pulse (bpm): 70 Weight (lbs): 122 Respiratory Rate (breaths/min): 18 Body Mass Index (BMI): 21.6 Blood Pressure (mmHg): 123/65 Reference Range: 80 - 120 mg / dl Electronic Signature(s) Signed: 03/14/2019 2:56:39 PM By: Carlene Coria RN Entered By: Carlene Coria  on 03/06/2019 13:07:20

## 2019-03-20 ENCOUNTER — Encounter (HOSPITAL_BASED_OUTPATIENT_CLINIC_OR_DEPARTMENT_OTHER): Payer: Medicare Other | Attending: Internal Medicine | Admitting: Internal Medicine

## 2019-03-20 ENCOUNTER — Other Ambulatory Visit: Payer: Self-pay

## 2019-03-20 DIAGNOSIS — I110 Hypertensive heart disease with heart failure: Secondary | ICD-10-CM | POA: Diagnosis not present

## 2019-03-20 DIAGNOSIS — I2510 Atherosclerotic heart disease of native coronary artery without angina pectoris: Secondary | ICD-10-CM | POA: Diagnosis not present

## 2019-03-20 DIAGNOSIS — I480 Paroxysmal atrial fibrillation: Secondary | ICD-10-CM | POA: Diagnosis not present

## 2019-03-20 DIAGNOSIS — S81002A Unspecified open wound, left knee, initial encounter: Secondary | ICD-10-CM | POA: Diagnosis not present

## 2019-03-20 DIAGNOSIS — G4730 Sleep apnea, unspecified: Secondary | ICD-10-CM | POA: Insufficient documentation

## 2019-03-20 DIAGNOSIS — Z89611 Acquired absence of right leg above knee: Secondary | ICD-10-CM | POA: Insufficient documentation

## 2019-03-20 DIAGNOSIS — X58XXXA Exposure to other specified factors, initial encounter: Secondary | ICD-10-CM | POA: Insufficient documentation

## 2019-03-20 DIAGNOSIS — Z96652 Presence of left artificial knee joint: Secondary | ICD-10-CM | POA: Diagnosis not present

## 2019-03-20 DIAGNOSIS — L97822 Non-pressure chronic ulcer of other part of left lower leg with fat layer exposed: Secondary | ICD-10-CM | POA: Diagnosis not present

## 2019-03-20 DIAGNOSIS — I5042 Chronic combined systolic (congestive) and diastolic (congestive) heart failure: Secondary | ICD-10-CM | POA: Diagnosis not present

## 2019-03-20 DIAGNOSIS — E1140 Type 2 diabetes mellitus with diabetic neuropathy, unspecified: Secondary | ICD-10-CM | POA: Insufficient documentation

## 2019-03-20 DIAGNOSIS — M869 Osteomyelitis, unspecified: Secondary | ICD-10-CM | POA: Insufficient documentation

## 2019-03-20 NOTE — Progress Notes (Addendum)
Kelsey, Joseph ([id number]) Visit Report for 03/20/2019 Arrival Information Details Patient Name: Date of Service: Kelsey Joseph, Kelsey Joseph 03/20/2019 3:30 PM Medical Record [id number] Patient Account Number: [id number] Date of Birth/Sex: Treating RN: [birth year] (85 y.o. Kelsey Joseph Primary Care Veryl Winemiller: Alden Server Other Clinician: Referring Wyat Infinger: Treating Opha Mcghee/Extender:Robson, Towana Badger, Rutherford [place] in Treatment: 15 Visit Information History Since Last Visit Added or deleted any medications: No Patient Arrived: Wheel Chair Any new allergies or adverse reactions: No Arrival Time: 15:49 Had a fall or experienced change in No activities of daily living that may affect Accompanied By: alone risk of falls: Transfer Assistance: None Signs or symptoms of abuse/neglect since last No Patient Identification Verified: Yes visito Secondary Verification Process Completed: Yes Hospitalized since last visit: No Patient Requires Transmission-Based No Implantable device outside of the clinic excluding No Precautions: cellular tissue based products placed in the center Patient Has Alerts: No since last visit: Has Dressing in Place as Prescribed: Yes Pain Present Now: No Electronic Signature(s) Signed: 03/20/2019 6:02:21 PM By: Levan Hurst RN, BSN Entered By: Levan Hurst on 03/20/2019 15:49:25 -------------------------------------------------------------------------------- Encounter Discharge Information Details Patient Name: Date of Service: Kelsey Joseph 03/20/2019 3:30 PM Medical Record [id number] Patient Account Number: [id number] Date of Birth/Sex: Treating RN: 1928-03-10 (85 y.o. Kelsey Joseph Primary Care Kellyn Mansfield: Alden Server Other Clinician: Referring Aleathea Pugmire: Treating Hiram Mciver/Extender:Robson, Towana Badger, Rutherford Limerick in Treatment: 15 Encounter Discharge Information Items Discharge Condition: Stable Ambulatory Status:  Wheelchair Discharge Destination: Home Transportation: Private Auto Accompanied By: alone Schedule Follow-up Appointment: Yes Clinical Summary of Care: Patient Declined Electronic Signature(s) Signed: 03/20/2019 6:02:21 PM By: Levan Hurst RN, BSN Entered By: Levan Hurst on 03/20/2019 18:01:10 -------------------------------------------------------------------------------- Multi Wound Chart Details Patient Name: Date of Service: Kelsey Joseph 03/20/2019 3:30 PM Medical Record [id number] Patient Account Number: [id number] Date of Birth/Sex: Treating RN: 1927-06-14 (85 y.o. Kelsey Joseph Primary Care Jinx Gilden: Alden Server Other Clinician: Referring Danford Tat: Treating Beva Remund/Extender:Robson, Towana Badger, Rutherford [place] in Treatment: 15 Vital Signs Height(in): 63 Capillary Blood 136 Glucose(mg/dl): Weight(lbs): 122 Pulse(bpm): 67 Body Mass Index(BMI): 22 Blood Pressure(mmHg): 100/41 Temperature(F): 98.4 Respiratory 16 Rate(breaths/min): Photos: [1:No Photos] [N/A:N/A] Wound Location: [1:Left Knee] [N/A:N/A] Wounding Event: [1:Gradually Appeared] [N/A:N/A] Primary Etiology: [1:Trauma, Other] [N/A:N/A] Secondary Etiology: [1:Diabetic Wound/Ulcer of the N/A Lower Extremity] Comorbid History: [1:Sleep Apnea, Congestive N/A Heart Failure, Coronary Artery Disease, Type II Diabetes, Osteomyelitis, Neuropathy] Date Acquired: [1:09/09/2018] [N/A:N/A] Weeks of Treatment: [1:15] [N/A:N/A] Wound Status: [1:Open] [N/A:N/A] Measurements L x W x D 2x1.8x0.2 [N/A:N/A] (cm) Area (cm) : [1:2.827] [N/A:N/A] Volume (cm) : [1:0.565] [N/A:N/A] % Reduction in Area: [1:-928.00%] [N/A:N/A] % Reduction in Volume: -927.30% [N/A:N/A] Classification: [1:Full Thickness Without Exposed Support Structures] [N/A:N/A] Exudate Amount: [1:Medium] [N/A:N/A] Exudate Type: [1:Serosanguineous] [N/A:N/A] Exudate Color: [1:red, brown] [N/A:N/A] Wound Margin: [1:Epibole]  [N/A:N/A] Granulation Amount: [1:Large (67-100%)] [N/A:N/A] Granulation Quality: [1:Red, Pink, Hyper- granulation] [N/A:N/A] Necrotic Amount: [1:Small (1-33%)] [N/A:N/A] Exposed Structures: [1:Fat Layer (Subcutaneous Tissue) Exposed: Yes Fascia: No Tendon: No Muscle: No Joint: No Bone: No] [N/A:N/A] Epithelialization: [1:Small (1-33%) Chemical Cauterization] [N/A:N/A N/A] Treatment Notes Electronic Signature(s) Signed: 03/20/2019 5:56:50 PM By: Linton Ham MD Signed: 03/20/2019 6:02:21 PM By: Levan Hurst RN, BSN Entered By: Linton Ham on 03/20/2019 16:48:10 -------------------------------------------------------------------------------- Multi-Disciplinary Care Plan Details Patient Name: Date of Service: Kelsey, Joseph 03/20/2019 3:30 PM Medical Record [id number] Patient Account Number: [id number] Date of Birth/Sex: Treating RN: 10/10/1927 (85 y.o. Kelsey Joseph Primary Care Valente Fosberg: Alden Server Other Clinician: Referring Adonis Yim: Treating  Othel Dicostanzo/Extender:Robson, Towana Badger, Lynnell Chad Weeks in Treatment: 15 Active Inactive Wound/Skin Impairment Nursing Diagnoses: Impaired tissue integrity Knowledge deficit related to ulceration/compromised skin integrity Goals: Patient/caregiver will verbalize understanding of skin care regimen Date Initiated: 11/30/2018 Target Resolution Date: 04/07/2019 Goal Status: Active Ulcer/skin breakdown will have a volume reduction of 30% by week 4 Date Initiated: 11/30/2018 Date Inactivated: 01/06/2019 Target Resolution Date: 12/29/2018 Unmet Reason: debrided Goal Status: Unmet undermining Ulcer/skin breakdown will have a volume reduction of 50% by week 8 Date Inactivated: 01/27/2019 Target10/23/2020 Resolution Date Initiated: 01/06/2019 Date: Unmet Reason: multiple Goal Status: Unmet debridements, atypical wound Interventions: Assess patient/caregiver ability to obtain necessary supplies Assess patient/caregiver  ability to perform ulcer/skin care regimen upon admission and as needed Assess ulceration(s) every visit Provide education on ulcer and skin care Treatment Activities: Skin care regimen initiated : 11/30/2018 Topical wound management initiated : 11/30/2018 Notes: Electronic Signature(s) Signed: 03/20/2019 6:02:21 PM By: Levan Hurst RN, BSN Entered By: Levan Hurst on 03/20/2019 16:01:52 -------------------------------------------------------------------------------- Pain Assessment Details Patient Name: Date of Service: Kelsey, Joseph 03/20/2019 3:30 PM Medical Record [id number] Patient Account Number: [id number] Date of Birth/Sex: Treating RN: Jun 27, 1927 (85 y.o. Kelsey Joseph Primary Care Sheala Dosh: Alden Server Other Clinician: Referring Alta Shober: Treating Willard Madrigal/Extender:Robson, Towana Badger, Rutherford [place] in Treatment: 15 Active Problems Location of Pain Severity and Description of Pain Patient Has Paino No Site Locations Pain Management and Medication Current Pain Management: Electronic Signature(s) Signed: 03/20/2019 6:02:21 PM By: Levan Hurst RN, BSN Entered By: Levan Hurst on 03/20/2019 15:49:33 -------------------------------------------------------------------------------- Patient/Caregiver Education Details Patient Name: Date of Service: Kelsey Joseph 12/14/2020andnbsp3:30 PM Medical Record [id number] Patient Account Number: [id number] Date of Birth/Gender: Treating RN: Jan 27, 1928 (85 y.o. Kelsey Joseph Primary Care Physician: Alden Server Other Clinician: Referring Physician: Treating Physician/Extender:Robson, Towana Badger, Rutherford L[place] in Treatment: 15 Education Assessment Education Provided To: Patient Education Topics Provided Wound/Skin Impairment: Methods: Explain/Verbal Responses: State content correctly [employer]) Signed: 03/20/2019 6:02:21 PM By: Levan Hurst RN, BSN Entered By: Levan Hurst on 03/20/2019 17:58:59 -------------------------------------------------------------------------------- Wound Assessment Details Patient Name: Date of Service: Kelsey, Joseph 03/20/2019 3:30 PM Medical Record [id number] Patient Account Number: [id number] Date of Birth/Sex: Treating RN: 1927/09/30 (85 y.o. Kelsey Joseph Primary Care Aldrick Derrig: Alden Server Other Clinician: Referring Lether Tesch: Treating Bryonna Sundby/Extender:Robson, Towana Badger, Rutherford [place] in Treatment: 15 Wound Status Wound Number: 1 Primary Trauma, Other Etiology: Wound Location: Left Knee Secondary Diabetic Wound/Ulcer of the Lower Extremity Wounding Event: Gradually Appeared Etiology: Date Acquired: 09/09/2018 Wound Open Weeks Of Treatment: 15 Status: Clustered Wound: No Comorbid Sleep Apnea, Congestive Heart Failure, History: Coronary Artery Disease, Type II Diabetes, Osteomyelitis, Neuropathy Photos Wound Measurements Length: (cm) 2 % Reduct Width: (cm) 1.8 % Reduct Depth: (cm) 0.2 Epitheli Area: (cm) 2.827 Tunneli Volume: (cm) 0.565 Undermi Wound Description Classification: Full Thickness Without Exposed Support Foul Odo Structures Slough/F Wound Epibole Margin: Exudate Medium Amount: Exudate Serosanguineous Type: Exudate red, brown Color: Wound Bed Granulation Amount: Large (67-100%) Granulation Quality: Red, Pink, Hyper-granulation Fascia E Necrotic Amount: Small (1-33%) Fat Laye Necrotic Quality: Adherent Slough Tendon E Muscle E Joint Ex Bone Exp r After Cleansing: No ibrino Yes Exposed Structure xposed: No r (Subcutaneous Tissue) Exposed: Yes xposed: No xposed: No posed: No osed: No ion in Area: -928% ion in Volume: -927.3% alization: Small (1-33%) ng: No ning: No Treatment Notes Wound #1 (Left Knee) 1. Cleanse With Wound Cleanser 3. Primary Dressing Applied Hydrofera Blue 4. Secondary Dressing Foam Border Dressing Electronic  Signature(s) Signed: 03/21/2019 3:57:04 PM By: Mikeal Hawthorne EMT/HBOT Signed: 03/23/2019 5:32:06 PM By: Levan Hurst RN, BSN Previous Signature: 03/20/2019 6:02:21 PM Version By: Levan Hurst RN, BSN Entered By: Mikeal Hawthorne on 03/21/2019 14:53:18 -------------------------------------------------------------------------------- [place] Details Patient Name: Date of Service: Kelsey, Joseph 03/20/2019 3:30 PM Medical Record [id number] Patient Account Number: [id number] Date of Birth/Sex: Treating RN: 1928/03/28 (85 y.o. Kelsey Joseph Primary Care Daemon Dowty: Alden Server Other Clinician: Referring Amjad Fikes: Treating Zaiden Ludlum/Extender:Robson, Towana Badger, Rutherford [place] in Treatment: 15 Vital Signs Time Taken: 15:50 Temperature (F): 98.4 Height (in): 63 Pulse (bpm): 67 Weight (lbs): 122 Respiratory Rate (breaths/min): 16 Body Mass Index (BMI): 21.6 Blood Pressure (mmHg): 100/41 Capillary Blood Glucose (mg/dl): 136 Reference Range: 80 - 120 mg / dl Notes glucose per pt report Electronic Signature(s) Signed: 03/20/2019 6:02:21 PM By: Levan Hurst RN, BSN Entered By: Levan Hurst on 03/20/2019 15:53:32

## 2019-03-20 NOTE — Progress Notes (Signed)
RAYN, WOODINGTON ([id number]) Visit Report for 03/20/2019 HPI Details Patient Name: Date of Service: Kelsey Joseph, Kelsey Joseph 03/20/2019 3:30 PM Medical Record [id number] Patient Account Number: [id number] Date of Birth/Sex: Treating RN: 1927-10-10 (85 y.o. [name] Primary Care Provider: Alden Server Other Clinician: Referring Provider: Treating Provider/Extender:Jeany Seville, Towana Badger, Rutherford Limerick in Treatment: 15 History of Present Illness HPI Description: 11/30/2018 patient presents today for initial evaluation in our clinic concerning issues that she is having with her left knee. She states that she does not really know of any specific injury that occurred and cause this ulcer. Nonetheless she does have a scratch on her leg from her dog but again she is not aware of her dog actually scratching her at this location. She does have a history of multiple knee replacements with the last being in 2006 I believe this was her third. She also has obviously a artificial knee joint on the left. She has hypertension, paroxysmal atrial fibrillation, congestive heart failure, and a right above-knee amputation. This was secondary to osteomyelitis. At this time patient is having no pain with regard to her knee at this point. Fortunately she is also having no signs of active infection at this time as far as I can see. 12/07/2018 upon evaluation today patient actually appears to be showing some signs of improvement which is good news. She still though having a little bit of granulation around the edge of the wound bed has not had as much of a improvement right in the base of the wound as I like to see this is still very dry. Nonetheless I believe that we may want to add hydrogel to the collagen in order to allow this to heal more appropriately. She is definitely in agreement with giving this a try. The good news is there does not appear to be any signs of infection. 9/17; patient have not seen  previously. 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 She has had skin grafting and also an artificial knee joint apparently from 2006 on the left. She has a small open area on the left lateral part of her knee. The exact reason for this is unclear. We have been using silver collagen with some improvement initially but not since the last visit. She is unaware of any skin issues in this area before it open. She thinks she has had this for about 2 months 9/24 absolutely no change in the condition of this wound. Almost looks like a small crater although the patient was not aware of a skin condition in this area. We have been using silver collagen absolutely no change 10/2; pathology of the nodule that did open last time suggested damage scar tissue. Also suggested the possibility of a follicle or cyst. There was no malignancy. We have been using silver collagen 10/9; wound surface is slightly smaller but no suggestion of viable tissue. Still requiring debridement we are using Iodoflex 10/23; 2-week follow-up. Her wound is somewhat better in terms of the surface although quite a bit bigger. Because of the rapid expansion and surface area I have done a shave biopsy of this now large wound. This started as a small cavitating nodule. We have been using Iodoflex I changed her to [facility] today 11/6; biopsy I did last week was negative for malignancy. PAS stain was negative. We are using Hydrofera Blue. This was felt to be dermal scar 11/30. Patient  is using Hydrofera Blue to this area in the middle of scar tissue on the left upper leg just below her knee. Wound slightly smaller. I have also been using silver nitrate on hyper granulation 12/14; she is using Hydrofera Blue. The wound is slightly smaller surface certainly looks healthier. She does have however hyper granulation which I continue to use  silver nitrate to allow continued epithelialization Electronic Signature(s) Signed: 03/20/2019 5:56:50 PM By: Linton Ham MD Entered By: Linton Ham on 03/20/2019 16:49:15 -------------------------------------------------------------------------------- Chemical Cauterization Details Patient Name: Date of Service: [name] 03/20/2019 3:30 PM Medical Record [id number] Patient Account Number: [id number] Date of Birth/Sex: Treating RN: August 01, 1927 (85 y.o. [name] Primary Care Provider: Alden Server Other Clinician: Referring Provider: Treating Provider/Extender:Kallon Caylor, Towana Badger, Rutherford Limerick in Treatment: 15 Procedure Performed for: Wound #1 Left Knee Performed By: Physician Ricard Dillon., MD Post Procedure Diagnosis Same as Pre-procedure Notes silver nitrate Electronic Signature(s) Signed: 03/20/2019 5:56:50 PM By: Linton Ham MD Entered By: Linton Ham on 03/20/2019 16:48:19 -------------------------------------------------------------------------------- Physical Exam Details Patient Name: Date of Service: FRANCOISE, EGGETT 03/20/2019 3:30 PM Medical Record [id number] Patient Account Number: [id number] Date of Birth/Sex: Treating RN: 02-Feb-1928 (85 y.o. [name] Primary Care Provider: Alden Server Other Clinician: Referring Provider: Treating Provider/Extender:Talana Slatten, Towana Badger, Rutherford Limerick in Treatment: 15 Constitutional Sitting or standing Blood Pressure is within target range for patient.. Pulse regular and within target range for patient.Marland Kitchen Respirations regular, non-labored and within target range.. Temperature is normal and within the target range for the patient.Marland Kitchen Appears in no distress. Notes Wound exam; left lateral knee just lateral to the patellar tendon. This is in the middle of scar tissue from previous grafting. Wound is still somewhat hyper granular. I used silver nitrate to not back  some of the hyper granulation from the circumference. I think we are making gradual progress there is no evidence of surrounding infection Electronic Signature(s) Signed: 03/20/2019 5:56:50 PM By: Linton Ham MD Entered By: Linton Ham on 03/20/2019 16:50:24 -------------------------------------------------------------------------------- Physician Orders Details Patient Name: Date of Service: DANILAH, SIELAFF 03/20/2019 3:30 PM Medical Record [id number] Patient Account Number: [id number] Date of Birth/Sex: Treating RN: 14-Dec-1927 (85 y.o. [name] Primary Care Provider: Alden Server Other Clinician: Referring Provider: Treating Provider/Extender:Taffany Heiser, Towana Badger, Rutherford Limerick in Treatment: 15 Verbal / Phone Orders: No Diagnosis Coding ICD-10 Coding Code Description S81.002A Unspecified open wound, left knee, initial encounter L97.822 Non-pressure chronic ulcer of other part of left lower leg with fat layer exposed Z96.652 Presence of left artificial knee joint I10 Essential (primary) hypertension I48.0 Paroxysmal atrial fibrillation I50.42 Chronic combined systolic (congestive) and diastolic (congestive) heart failure Z89.611 Acquired absence of right leg above knee Follow-up Appointments Return appointment in 3 weeks. Dressing Change Frequency Wound #1 Left Knee Change Dressing every other day. Wound Cleansing Wound #1 Left Knee May shower and wash wound with soap and water. Primary Wound Dressing Wound #1 Left Knee Hydrofera Blue - classic - if available Secondary Dressing Wound #1 Left Knee Foam Border - or large bordered gauze. Edema Control Elevate legs to the level of the heart or above for 30 minutes daily and/or when sitting, a frequency of: - throughout the day. Electronic Signature(s) Signed: 03/20/2019 5:56:50 PM By: Linton Ham MD Signed: 03/20/2019 6:02:21 PM By: Levan Hurst RN, BSN Entered By: Levan Hurst on  03/20/2019 16:01:22 -------------------------------------------------------------------------------- Problem List Details Patient Name: Date of Service: [name]. 03/20/2019 3:30 PM Medical Record  [id number] Patient Account Number: [id number] Date of Birth/Sex: Treating RN: 07/21/1927 (85 y.o. [name] Primary Care Provider: Alden Server Other Clinician: Referring Provider: Treating Provider/Extender:Jesseca Marsch, Towana Badger, Rutherford Limerick in Treatment: 15 Active Problems ICD-10 Evaluated Encounter Code Description Active Date Today Diagnosis S81.002A Unspecified open wound, left knee, initial encounter 11/30/2018 No Yes L97.822 Non-pressure chronic ulcer of other part of left lower 11/30/2018 No Yes leg with fat layer exposed Z96.652 Presence of left artificial knee joint 11/30/2018 No Yes I10 Essential (primary) hypertension 11/30/2018 No Yes I48.0 Paroxysmal atrial fibrillation 11/30/2018 No Yes I50.42 Chronic combined systolic (congestive) and diastolic [id number] No Yes (congestive) heart failure Z89.611 Acquired absence of right leg above knee 11/30/2018 No Yes Inactive Problems Resolved Problems Electronic Signature(s) Signed: 03/20/2019 5:56:50 PM By: Linton Ham MD Entered By: Linton Ham on 03/20/2019 16:48:03 -------------------------------------------------------------------------------- Progress Note Details Patient Name: Date of Service: [name] 03/20/2019 3:30 PM Medical Record [id number] Patient Account Number: [id number] Date of Birth/Sex: Treating RN: 10-24-1927 (85 y.o. [name] Primary Care Provider: Alden Server Other Clinician: Referring Provider: Treating Provider/Extender:Ilyaas Musto, Towana Badger, Rutherford Limerick in Treatment: 15 Subjective History of Present Illness (HPI) 11/30/2018 patient presents today for initial evaluation in our clinic concerning issues that she is having with her left knee. She  states that she does not really know of any specific injury that occurred and cause this ulcer. Nonetheless she does have a scratch on her leg from her dog but again she is not aware of her dog actually scratching her at this location. She does have a history of multiple knee replacements with the last being in 2006 I believe this was her third. She also has obviously a artificial knee joint on the left. She has hypertension, paroxysmal atrial fibrillation, congestive heart failure, and a right above-knee amputation. This was secondary to osteomyelitis. At this time patient is having no pain with regard to her knee at this point. Fortunately she is also having no signs of active infection at this time as far as I can see. 12/07/2018 upon evaluation today patient actually appears to be showing some signs of improvement which is good news. She still though having a little bit of granulation around the edge of the wound bed has not had as much of a improvement right in the base of the wound as I like to see this is still very dry. Nonetheless I believe that we may want to add hydrogel to the collagen in order to allow this to heal more appropriately. She is definitely in agreement with giving this a try. The good news is there does not appear to be any signs of infection. 9/17; patient have not seen previously. 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 She has had skin grafting and also an artificial knee joint apparently from 2006 on the left. She has a small open area on the left lateral part of her knee. The exact reason for this is unclear. We have been using silver collagen with some improvement initially but not since the last visit. She is unaware of any skin issues in this area before it open. She thinks she has had this for about 2 months 9/24 absolutely no change in the condition of this wound.  Almost looks like a small crater although the patient was not aware of a skin condition in this area. We have been using silver  collagen absolutely no change 10/2; pathology of the nodule that did open last time suggested damage scar tissue. Also suggested the possibility of a follicle or cyst. There was no malignancy. We have been using silver collagen 10/9; wound surface is slightly smaller but no suggestion of viable tissue. Still requiring debridement we are using Iodoflex 10/23; 2-week follow-up. Her wound is somewhat better in terms of the surface although quite a bit bigger. Because of the rapid expansion and surface area I have done a shave biopsy of this now large wound. This started as a small cavitating nodule. We have been using Iodoflex I changed her to [facility] today 11/6; biopsy I did last week was negative for malignancy. PAS stain was negative. We are using Hydrofera Blue. This was felt to be dermal scar 11/30. Patient is using Hydrofera Blue to this area in the middle of scar tissue on the left upper leg just below her knee. Wound slightly smaller. I have also been using silver nitrate on hyper granulation 12/14; she is using Hydrofera Blue. The wound is slightly smaller surface certainly looks healthier. She does have however hyper granulation which I continue to use silver nitrate to allow continued epithelialization Objective Constitutional Sitting or standing Blood Pressure is within target range for patient.. Pulse regular and within target range for patient.Marland Kitchen Respirations regular, non-labored and within target range.. Temperature is normal and within the target range for the patient.Marland Kitchen Appears in no distress. Vitals Time Taken: 3:50 PM, Height: 63 in, Weight: 122 lbs, BMI: 21.6, Temperature: 98.4 F, Pulse: 67 bpm, Respiratory Rate: 16 breaths/min, Blood Pressure: 100/41 mmHg, Capillary Blood Glucose: 136 mg/dl. General Notes: glucose per pt report General Notes:  Wound exam; left lateral knee just lateral to the patellar tendon. This is in the middle of scar tissue from previous grafting. Wound is still somewhat hyper granular. I used silver nitrate to not back some of the hyper granulation from the circumference. I think we are making gradual progress there is no evidence of surrounding infection Integumentary (Hair, Skin) Wound #1 status is Open. Original cause of wound was Gradually Appeared. The wound is located on the Left Knee. The wound measures 2cm length x 1.8cm width x 0.2cm depth; 2.827cm^2 area and 0.565cm^3 volume. There is Fat Layer (Subcutaneous Tissue) Exposed exposed. There is no tunneling or undermining noted. There is a medium amount of serosanguineous drainage noted. The wound margin is epibole. There is large (67-100%) red, pink, hyper - granulation within the wound bed. There is a small (1-33%) amount of necrotic tissue within the wound bed including Adherent Slough. Assessment Active Problems ICD-10 Unspecified open wound, left knee, initial encounter Non-pressure chronic ulcer of other part of left lower leg with fat layer exposed Presence of left artificial knee joint Essential (primary) hypertension Paroxysmal atrial fibrillation Chronic combined systolic (congestive) and diastolic (congestive) heart failure Acquired absence of right leg above knee Procedures Wound #1 Pre-procedure diagnosis of Wound #1 is a Trauma, Other located on the Left Knee . An Chemical Cauterization procedure was performed by Ricard Dillon., MD. Post procedure Diagnosis Wound #1: Same as Pre-Procedure Notes: silver nitrate Plan Follow-up Appointments: Return appointment in 3 weeks. Dressing Change Frequency: Wound #1 Left Knee: Change Dressing every other day. Wound Cleansing: Wound #1 Left Knee: May shower and wash wound with soap and water. Primary Wound Dressing: Wound #1 Left Knee: Hydrofera Blue - classic - if  available Secondary Dressing: Wound #1 Left Knee: Foam Border - or large bordered  gauze. Edema Control: Elevate legs to the level of the heart or above for 30 minutes daily and/or when sitting, a frequency of: - throughout the day. 1. Hydrofera Blue to continue. Electronic Signature(s) Signed: 03/20/2019 5:56:50 PM By: Linton Ham MD Entered By: Linton Ham on 03/20/2019 16:53:36 -------------------------------------------------------------------------------- SuperBill Details Patient Name: Date of Service: KONSTANTINA, GRAVELLE 03/20/2019 Medical Record [id number] Patient Account Number: [id number] Date of Birth/Sex: Treating RN: 1927-10-17 (85 y.o. [name] Primary Care Provider: Alden Server Other Clinician: Referring Provider: Treating Provider/Extender:Darneisha Windhorst, Towana Badger, Rutherford Limerick in Treatment: 15 Diagnosis Coding ICD-10 Codes Code Description S81.002A Unspecified open wound, left knee, initial encounter L97.822 Non-pressure chronic ulcer of other part of left lower leg with fat layer exposed Z96.652 Presence of left artificial knee joint I10 Essential (primary) hypertension I48.0 Paroxysmal atrial fibrillation I50.42 Chronic combined systolic (congestive) and diastolic (congestive) heart failure Z89.611 Acquired absence of right leg above knee Facility Procedures Physician Procedures CPT4 Code: [id number] Description: [id number] - WC PHYS CHEM CAUT GRAN TISSUE ICD-10 Diagnosis Description S81.002A Unspecified open wound, left knee, initial encounter Modifier: Quantity: 1 Electronic Signature(s) Signed: 03/20/2019 5:56:50 PM By: Linton Ham MD Entered By: Linton Ham on 03/20/2019 16:54:10

## 2019-03-24 NOTE — Progress Notes (Signed)
Remote pacemaker transmission.   

## 2019-04-03 ENCOUNTER — Other Ambulatory Visit: Payer: Self-pay

## 2019-04-03 ENCOUNTER — Ambulatory Visit (INDEPENDENT_AMBULATORY_CARE_PROVIDER_SITE_OTHER): Payer: Medicare Other | Admitting: Neurology

## 2019-04-03 ENCOUNTER — Encounter: Payer: Self-pay | Admitting: Neurology

## 2019-04-03 VITALS — BP 132/65 | HR 65 | Temp 97.3°F

## 2019-04-03 DIAGNOSIS — I25118 Atherosclerotic heart disease of native coronary artery with other forms of angina pectoris: Secondary | ICD-10-CM

## 2019-04-03 DIAGNOSIS — M5416 Radiculopathy, lumbar region: Secondary | ICD-10-CM

## 2019-04-03 DIAGNOSIS — R202 Paresthesia of skin: Secondary | ICD-10-CM | POA: Diagnosis not present

## 2019-04-03 MED ORDER — PREGABALIN 50 MG PO CAPS: 50.0000 mg | ORAL_CAPSULE | Freq: Three times a day (TID) | ORAL | 4 refills | Status: DC
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
## 2019-04-10 ENCOUNTER — Encounter (HOSPITAL_BASED_OUTPATIENT_CLINIC_OR_DEPARTMENT_OTHER): Payer: Medicare Other | Attending: Internal Medicine | Admitting: Internal Medicine

## 2019-04-10 ENCOUNTER — Other Ambulatory Visit: Payer: Self-pay

## 2019-04-10 DIAGNOSIS — E1140 Type 2 diabetes mellitus with diabetic neuropathy, unspecified: Secondary | ICD-10-CM | POA: Diagnosis not present

## 2019-04-10 DIAGNOSIS — Z96652 Presence of left artificial knee joint: Secondary | ICD-10-CM | POA: Diagnosis not present

## 2019-04-10 DIAGNOSIS — Z89611 Acquired absence of right leg above knee: Secondary | ICD-10-CM | POA: Insufficient documentation

## 2019-04-10 DIAGNOSIS — I110 Hypertensive heart disease with heart failure: Secondary | ICD-10-CM | POA: Insufficient documentation

## 2019-04-10 DIAGNOSIS — I480 Paroxysmal atrial fibrillation: Secondary | ICD-10-CM | POA: Insufficient documentation

## 2019-04-10 DIAGNOSIS — E11622 Type 2 diabetes mellitus with other skin ulcer: Secondary | ICD-10-CM | POA: Diagnosis present

## 2019-04-10 DIAGNOSIS — L97822 Non-pressure chronic ulcer of other part of left lower leg with fat layer exposed: Secondary | ICD-10-CM | POA: Diagnosis not present

## 2019-04-10 DIAGNOSIS — I5042 Chronic combined systolic (congestive) and diastolic (congestive) heart failure: Secondary | ICD-10-CM | POA: Diagnosis not present

## 2019-04-10 NOTE — Progress Notes (Addendum)
Kelsey Joseph, Kelsey Joseph ([id number]) Visit Report for 04/10/2019 Arrival Information Details Patient Name: Date of Service: Kelsey Joseph, Kelsey Joseph 04/10/2019 12:30 PM Medical Record [id number] Patient Account Number: [id number] Date of Birth/Sex: Treating RN: 01/15/1928 (86 y.o. [name], Meta.Reding Primary Care Aashka Salomone: Alden Server Other Clinician: Referring Benjamen Koelling: Treating Melo Stauber/Extender:Robson, Towana Badger, Rutherford [place] in Treatment: 18 Visit Information History Since Last Visit Added or deleted any medications: No Patient Arrived: Wheel Chair Any new allergies or adverse reactions: No Arrival Time: 12:46 Had a fall or experienced change in No activities of daily living that may affect Accompanied By: self risk of falls: Transfer Assistance: None Signs or symptoms of abuse/neglect since last No Patient Identification Verified: Yes visito Secondary Verification Process Completed: Yes Hospitalized since last visit: No Patient Requires Transmission-Based No Implantable device outside of the clinic excluding No Precautions: cellular tissue based products placed in the center Patient Has Alerts: No since last visit: Has Dressing in Place as Prescribed: Yes Pain Present Now: No Electronic Signature(s) Signed: 04/10/2019 4:44:32 PM By: Deon Pilling Entered By: Deon Pilling on 04/10/2019 12:47:31 -------------------------------------------------------------------------------- Encounter Discharge Information Details Patient Name: Date of Service: Kelsey Joseph. 04/10/2019 12:30 PM Medical Record [id number] Patient Account Number: [id number] Date of Birth/Sex: Treating RN: 1927/08/13 (86 y.o. Debby Bud Primary Care Justus Droke: Alden Server Other Clinician: Referring Efren Kross: Treating Marcena Dias/Extender:Robson, Towana Badger, Rutherford [place] in Treatment: 18 Encounter Discharge Information Items Post Procedure Vitals Discharge Condition: Stable Temperature (F):  97.9 Ambulatory Status: Wheelchair Pulse (bpm): 90 Discharge Destination: Home Respiratory Rate (breaths/min): 18 Transportation: Private Auto Blood Pressure (mmHg): 93/57 Accompanied By: self Schedule Follow-up Appointment: Yes Clinical Summary of Care: Electronic Signature(s) Signed: 04/10/2019 4:44:32 PM By: Deon Pilling Entered By: Deon Pilling on 04/10/2019 13:31:56 -------------------------------------------------------------------------------- Lower Extremity Assessment Details Patient Name: Date of Service: Kelsey Joseph, Kelsey Joseph 04/10/2019 12:30 PM Medical Record [id number] Patient Account Number: [id number] Date of Birth/Sex: Treating RN: 1928/03/05 (86 y.o. Debby Bud Primary Care Marky Buresh: Alden Server Other Clinician: Referring Masayoshi Couzens: Treating Daina Cara/Extender:Robson, Towana Badger, Rutherford [place] in Treatment: 18 Edema Assessment Assessed: [Left: No] [Right: No] Edema: [Left: N] [Right: o] Calf Left: Right: Point of Measurement: 37 cm From Medial Instep 30 cm cm Ankle Left: Right: Point of Measurement: 10 cm From Medial Instep 21 cm cm Vascular Assessment Pulses: Dorsalis Pedis Palpable: [Left:Yes] Electronic Signature(s) Signed: 04/10/2019 4:44:32 PM By: Deon Pilling Entered By: Deon Pilling on 04/10/2019 12:52:49 -------------------------------------------------------------------------------- Multi Wound Chart Details Patient Name: Date of Service: Kelsey Joseph 04/10/2019 12:30 PM Medical Record [id number] Patient Account Number: [id number] Date of Birth/Sex: Treating RN: 05/09/1927 (86 y.o. F) Primary Care Niyla Marone: Alden Server Other Clinician: Referring Karla Pavone: Treating Jeaninne Lodico/Extender:Robson, Towana Badger, Rutherford [place] in Treatment: 18 Vital Signs Height(in): 63 Capillary Blood 140 Glucose(mg/dl): Weight(lbs): 122 Pulse(bpm): 90 Body Mass Index(BMI): 22 Blood Pressure(mmHg): 93/57 Temperature(F):  97.9 Respiratory 18 Rate(breaths/min): Photos: [1:No Photos] [N/A:N/A] Wound Location: [1:Left Knee] [N/A:N/A] Wounding Event: [1:Gradually Appeared] [N/A:N/A] Primary Etiology: [1:Trauma, Other] [N/A:N/A] Secondary Etiology: [1:Diabetic Wound/Ulcer of the N/A Lower Extremity] Comorbid History: [1:Sleep Apnea, Congestive N/A Heart Failure, Coronary Artery Disease, Type II Diabetes, Osteomyelitis, Neuropathy] Date Acquired: [1:09/09/2018] [N/A:N/A] Weeks of Treatment: [1:18] [N/A:N/A] Wound Status: [1:Open] [N/A:N/A] Measurements L x W x D 1.2x1.5x0.2 [N/A:N/A] (cm) Area (cm) : [1:1.414] [N/A:N/A] Volume (cm) : [1:0.283] [N/A:N/A] % Reduction in Area: [1:-414.20%] [N/A:N/A] % Reduction in Volume: -414.50% [N/A:N/A] Classification: [1:Full Thickness Without Exposed Support Structures] [N/A:N/A] Exudate Amount: [1:Medium] [N/A:N/A] Exudate Type: [1:Serosanguineous] [  N/A:N/A] Exudate Color: [1:red, brown] [N/A:N/A] Wound Margin: [1:Well defined, not attached N/A] Granulation Amount: [1:Large (67-100%)] [N/A:N/A] Granulation Quality: [1:Red, Pink, Hyper- granulation] [N/A:N/A] Necrotic Amount: [1:Small (1-33%)] [N/A:N/A] Exposed Structures: [1:Fat Layer (Subcutaneous N/A Tissue) Exposed: Yes Fascia: No Tendon: No Muscle: No Joint: No Bone: No] Epithelialization: [1:Small (1-33%)] [N/A:N/A] Debridement: [1:Debridement - Excisional N/A] Pre-procedure [1:13:20] [N/A:N/A] Verification/Time Out Taken: Pain Control: [1:Other] [N/A:N/A] Tissue Debrided: [1:Subcutaneous, Slough] [N/A:N/A] Level: [1:Skin/Subcutaneous Tissue N/A] Debridement Area (sq cm):1.8 [N/A:N/A] Instrument: [1:Curette] [N/A:N/A] Bleeding: [1:Minimum] [N/A:N/A] Hemostasis Achieved: [1:Pressure] [N/A:N/A] Procedural Pain: [1:0] [N/A:N/A] Post Procedural Pain: [1:0] [N/A:N/A] Debridement Treatment [1:Procedure was tolerated] [N/A:N/A] Response: [1:well] Post Debridement [1:1.2x1.5x0.1] [N/A:N/A] Measurements L x W x  D (cm) Post Debridement [1:0.141] [N/A:N/A] Volume: (cm) Procedures Performed: [1:Debridement] [N/A:N/A] Treatment Notes Wound #1 (Left Knee) 1. Cleanse With Wound Cleanser 2. Periwound Care Skin Prep 3. Primary Dressing Applied Hydrofera Blue 4. Secondary Dressing Foam Border Dressing 5. Secured With Office manager) Signed: 04/10/2019 4:56:38 PM By: Linton Ham MD Entered By: Linton Ham on 04/10/2019 14:32:34 -------------------------------------------------------------------------------- [place] Details Patient Name: Date of Service: Kelsey Joseph, Kelsey Joseph 04/10/2019 12:30 PM Medical Record [id number] Patient Account Number: [id number] Date of Birth/Sex: Treating RN: 08-26-1927 (86 y.o. [name] Primary Care Abijah Roussel: Alden Server Other Clinician: Referring Kylina Vultaggio: Treating Marites Nath/Extender:Robson, Towana Badger, Rutherford [place] in Treatment: 18 Active Inactive Wound/Skin Impairment Nursing Diagnoses: Impaired tissue integrity Knowledge deficit related to ulceration/compromised skin integrity Goals: Patient/caregiver will verbalize understanding of skin care regimen Date Initiated: 11/30/2018 Target Resolution Date: 05/12/2019 Goal Status: Active Ulcer/skin breakdown will have a volume reduction of 30% by week 4 Date Initiated: 11/30/2018 Date Inactivated: 01/06/2019 Target Resolution Date: 12/29/2018 Unmet Reason: debrided Goal Status: Unmet undermining Ulcer/skin breakdown will have a volume reduction of 50% by week 8 Date Inactivated: 01/27/2019 Target Resolution Date Initiated: 01/06/2019 Date: 01/27/2019 Unmet Reason: multiple Goal Status: Unmet debridements, atypical wound Interventions: Assess patient/caregiver ability to obtain necessary supplies Assess patient/caregiver ability to perform ulcer/skin care regimen upon admission and as needed Assess ulceration(s) every visit Provide  education on ulcer and skin care Treatment Activities: Skin care regimen initiated : 11/30/2018 Topical wound management initiated : 11/30/2018 Notes: Electronic Signature(s) Signed: 04/10/2019 5:01:35 PM By: Levan Hurst RN, BSN Entered By: Levan Hurst on 04/10/2019 12:57:03 -------------------------------------------------------------------------------- Pain Assessment Details Patient Name: Date of Service: Kelsey Joseph, Kelsey Joseph 04/10/2019 12:30 PM Medical Record [id number] Patient Account Number: [id number] Date of Birth/Sex: Treating RN: [birth year] (86 y.o. Debby Bud Primary Care Levis Nazir: Alden Server Other Clinician: Referring Tamanika Heiney: Treating Dejha King/Extender:Robson, Towana Badger, Rutherford [place] in Treatment: 18 Active Problems Location of Pain Severity and Description of Pain Patient Has Paino No Site Locations Pain Management and Medication Current Pain Management: Electronic Signature(s) Signed: 04/10/2019 4:44:32 PM By: Deon Pilling Entered By: Deon Pilling on 04/10/2019 12:49:59 -------------------------------------------------------------------------------- Patient/Caregiver Education Details Patient Name: Date of Service: Kelsey Joseph 1/4/2021andnbsp12:30 PM Medical Record [id number] Patient Account Number: [id number] Date of Birth/Gender: Treating RN: 1927/07/10 (86 y.o. [name] Primary Care Physician: Alden Server Other Clinician: Referring Physician: Treating Physician/Extender:Robson, Towana Badger, Rutherford [place] in Treatment: 18 Education Assessment Education Provided To: Patient Education Topics Provided Wound/Skin Impairment: Methods: Explain/Verbal Responses: State content correctly [employer]) Signed: 04/10/2019 5:01:35 PM By: Levan Hurst RN, BSN Entered By: Levan Hurst on 04/10/2019 12:57:14 -------------------------------------------------------------------------------- Wound Assessment  Details Patient Name: Date of Service: Kelsey Joseph, Kelsey Joseph 04/10/2019 12:30 PM Medical Record [id number] Patient Account Number: [id number] Date  of Birth/Sex: Treating RN: July 24, 1927 (86 y.o. [name], Meta.Reding Primary Care Jetaun Colbath: Alden Server Other Clinician: Referring Lylla Eifler: Treating Shley Dolby/Extender:Robson, Towana Badger, Rutherford [place] in Treatment: 18 Wound Status Wound Number: 1 Primary Trauma, Other Etiology: Wound Location: Left Knee Secondary Diabetic Wound/Ulcer of the Lower Extremity Wounding Event: Gradually Appeared Etiology: Date Acquired: 09/09/2018 Wound Open Weeks Of Treatment: 18 Status: Clustered Wound: No Comorbid Sleep Apnea, Congestive Heart Failure, History: Coronary Artery Disease, Type II Diabetes, Osteomyelitis, Neuropathy Photos Wound Measurements Length: (cm) 1.2 % Reduct Width: (cm) 1.5 % Reduct Depth: (cm) 0.2 Epitheli Area: (cm) 1.414 Tunneli Volume: (cm) 0.283 Undermi Wound Description Classification: Full Thickness Without Exposed Support Foul Od Structures Slough/ Wound Well defined, not attached Margin: Exudate Medium Amount: Exudate Serosanguineous Type: Exudate red, brown Color: Wound Bed Granulation Amount: Large (67-100%) Granulation Quality: Red, Pink, Hyper-granulation Fascia Necrotic Amount: Small (1-33%) Fat Lay Necrotic Quality: Adherent Slough Tendon Muscl Joint Bone or After Cleansing: No Fibrino Yes Exposed Structure Exposed: No er (Subcutaneous Tissue) Exposed: Yes Exposed: No e Exposed: No Exposed: No Exposed: No ion in Area: -414.2% ion in Volume: -414.5% alization: Small (1-33%) ng: No ning: No Treatment Notes Wound #1 (Left Knee) 1. Cleanse With Wound Cleanser 2. Periwound Care Skin Prep 3. Primary Dressing Applied Hydrofera Blue 4. Secondary Dressing Foam Border Dressing 5. Secured With Office manager) Signed: 04/11/2019 3:29:37 PM By: Mikeal Hawthorne EMT/HBOT Signed: 04/12/2019 5:41:11 PM By: Deon Pilling Previous Signature: 04/10/2019 4:44:32 PM Version By: Deon Pilling Entered By: Mikeal Hawthorne on 04/11/2019 14:24:41 -------------------------------------------------------------------------------- Vitals Details Patient Name: Date of Service: Kelsey Joseph, Kelsey Joseph 04/10/2019 12:30 PM Medical Record [id number] Patient Account Number: [id number] Date of Birth/Sex: Treating RN: November 15, 1927 (86 y.o. [name], Meta.Reding Primary Care Jarron Curley: Alden Server Other Clinician: Referring Brooke Payes: Treating Ferol Laiche/Extender:Robson, Towana Badger, Rutherford [place] in Treatment: 18 Vital Signs Time Taken: 12:45 Temperature (F): 97.9 Height (in): 63 Pulse (bpm): 90 Weight (lbs): 122 Respiratory Rate (breaths/min): 18 Body Mass Index (BMI): 21.6 Blood Pressure (mmHg): 93/57 Capillary Blood Glucose (mg/dl): 140 Reference Range: 80 - 120 mg / dl Notes patient reported last CBG of 140 Electronic Signature(s) Signed: 04/10/2019 4:44:32 PM By: Deon Pilling Entered By: Deon Pilling on 04/10/2019 12:48:52

## 2019-04-10 NOTE — Progress Notes (Signed)
ALIXANDRA, BIBA ([id number]) Visit Report for 04/10/2019 Debridement Details Patient Name: Date of Service: Kelsey Joseph, Kelsey Joseph 04/10/2019 12:30 PM Medical Record [id number] Patient Account Number: [id number] Date of Birth/Sex: Treating RN: 1927-07-04 (86 y.o. F) Primary Care Provider: Alden Server Other Clinician: Referring Provider: Treating Provider/Extender:Percell Lamboy, Towana Badger, Rutherford Limerick in Treatment: 18 Debridement Performed for Wound #1 Left Knee Assessment: Performed By: Physician Ricard Dillon., MD Debridement Type: Debridement Severity of Tissue Pre Fat layer exposed Debridement: Level of Consciousness (Pre- Awake and Alert procedure): Pre-procedure Verification/Time Out Taken: Yes - 13:20 Start Time: 13:20 Pain Control: Other : Benzocaine 20% Total Area Debrided (L x W): 1.2 (cm) x 1.5 (cm) = 1.8 (cm) Tissue and other material Viable, Non-Viable, Slough, Subcutaneous, Biofilm, Slough debrided: Level: Skin/Subcutaneous Tissue Debridement Description: Excisional Instrument: Curette Bleeding: Minimum Hemostasis Achieved: Pressure End Time: 13:22 Procedural Pain: 0 Post Procedural Pain: 0 Response to Treatment: Procedure was tolerated well Level of Consciousness Awake and Alert (Post-procedure): Post Debridement Measurements of Total Wound Length: (cm) 1.2 Width: (cm) 1.5 Depth: (cm) 0.1 Volume: (cm) 0.141 Character of Wound/Ulcer Post Improved Debridement: Severity of Tissue Post Debridement: Fat layer exposed Post Procedure Diagnosis Same as Pre-procedure Electronic Signature(s) Signed: 04/10/2019 4:56:38 PM By: Linton Ham MD Entered By: Linton Ham on 04/10/2019 14:33:20 -------------------------------------------------------------------------------- HPI Details Patient Name: Date of Service: Kelsey Joseph 04/10/2019 12:30 PM Medical Record [id number] Patient Account Number: [id number] Date of Birth/Sex: Treating  RN: Jul 25, 1927 (86 y.o. F) Primary Care Provider: Alden Server Other Clinician: Referring Provider: Treating Provider/Extender:Saanvi Hakala, Towana Badger, Rutherford Limerick in Treatment: 18 History of Present Illness HPI Description: 11/30/2018 patient presents today for initial evaluation in our clinic concerning issues that she is having with her left knee. She states that she does not really know of any specific injury that occurred and cause this ulcer. Nonetheless she does have a scratch on her leg from her dog but again she is not aware of her dog actually scratching her at this location. She does have a history of multiple knee replacements with the last being in 2006 I believe this was her third. She also has obviously a artificial knee joint on the left. She has hypertension, paroxysmal atrial fibrillation, congestive heart failure, and a right above-knee amputation. This was secondary to osteomyelitis. At this time patient is having no pain with regard to her knee at this point. Fortunately she is also having no signs of active infection at this time as far as I can see. 12/07/2018 upon evaluation today patient actually appears to be showing some signs of improvement which is good news. She still though having a little bit of granulation around the edge of the wound bed has not had as much of a improvement right in the base of the wound as I like to see this is still very dry. Nonetheless I believe that we may want to add hydrogel to the collagen in order to allow this to heal more appropriately. She is definitely in agreement with giving this a try. The good news is there does not appear to be any signs of infection. 9/17; patient have not seen previously. 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 She has had skin grafting and also an artificial knee joint apparently from 2006 on the left. She  has a small open area on the left lateral part of her knee. The exact reason for  this is unclear. We have been using silver collagen with some improvement initially but not since the last visit. She is unaware of any skin issues in this area before it open. She thinks she has had this for about 2 months 9/24 absolutely no change in the condition of this wound. Almost looks like a small crater although the patient was not aware of a skin condition in this area. We have been using silver collagen absolutely no change 10/2; pathology of the nodule that did open last time suggested damage scar tissue. Also suggested the possibility of a follicle or cyst. There was no malignancy. We have been using silver collagen 10/9; wound surface is slightly smaller but no suggestion of viable tissue. Still requiring debridement we are using Iodoflex 10/23; 2-week follow-up. Her wound is somewhat better in terms of the surface although quite a bit bigger. Because of the rapid expansion and surface area I have done a shave biopsy of this now large wound. This started as a small cavitating nodule. We have been using Iodoflex I changed her to [facility] today 11/6; biopsy I did last week was negative for malignancy. PAS stain was negative. We are using Hydrofera Blue. This was felt to be dermal scar 11/30. Patient is using Hydrofera Blue to this area in the middle of scar tissue on the left upper leg just below her knee. Wound slightly smaller. I have also been using silver nitrate on hyper granulation 12/14; she is using Hydrofera Blue. The wound is slightly smaller surface certainly looks healthier. She does have however hyper granulation which I continue to use silver nitrate to allow continued epithelialization 1/4. Using Hydrofera Blue. Measuring slightly smaller in terms of surface area Electronic Signature(s) Signed: 04/10/2019 4:56:38 PM By: Linton Ham MD Entered By: Linton Ham on 04/10/2019  14:33:43 -------------------------------------------------------------------------------- Physical Exam Details Patient Name: Date of Service: Kelsey Joseph, Kelsey Joseph 04/10/2019 12:30 PM Medical Record [id number] Patient Account Number: [id number] Date of Birth/Sex: Treating RN: Dec 18, 1927 (86 y.o. F) Primary Care Provider: Alden Server Other Clinician: Referring Provider: Treating Provider/Extender:Kimi Bordeau, Towana Badger, Rutherford Limerick in Treatment: 18 Constitutional Patient is hypotensive. However appears well. Pulse regular and within target range for patient.Marland Kitchen Respirations regular, non-labored and within target range.. Temperature is normal and within the target range for the patient.Marland Kitchen Appears in no distress. Notes Wound exam; left lateral knee just lateral to the patellar tendon. This is in the middle of scar tissue from previous grafting. Under illumination surface did not look as good as when I saw this 2 weeks ago. I used a #3 curette to take off some of the fibrinous necrotic debris. Hemostasis with direct pressure. No evidence of surrounding infection Electronic Signature(s) Signed: 04/10/2019 4:56:38 PM By: Linton Ham MD Entered By: Linton Ham on 04/10/2019 14:35:05 -------------------------------------------------------------------------------- Physician Orders Details Patient Name: Date of Service: Kelsey Joseph, Kelsey Joseph 04/10/2019 12:30 PM Medical Record [id number] Patient Account Number: [id number] Date of Birth/Sex: Treating RN: Sigrid 24, 1929 (86 y.o. [name] Primary Care Provider: Alden Server Other Clinician: Referring Provider: Treating Provider/Extender:Zabrina Brotherton, Towana Badger, Rutherford Limerick in Treatment: 18 Verbal / Phone Orders: No Diagnosis Coding ICD-10 Coding Code Description S81.002A Unspecified open wound, left knee, initial encounter L97.822 Non-pressure chronic ulcer of other part of left lower leg with fat layer exposed Z96.652  Presence of left artificial knee joint I10 Essential (primary) hypertension I48.0 Paroxysmal atrial fibrillation I50.42 Chronic combined systolic (congestive) and diastolic (congestive) heart failure Z89.611 Acquired absence of right leg above  knee Follow-up Appointments Return Appointment in 2 weeks. Dressing Change Frequency Wound #1 Left Knee Change Dressing every other day. Wound Cleansing Wound #1 Left Knee May shower and wash wound with soap and water. Primary Wound Dressing Wound #1 Left Knee Hydrofera Blue - classic - if available Secondary Dressing Wound #1 Left Knee Foam Border - or large bordered gauze. Edema Control Elevate legs to the level of the heart or above for 30 minutes daily and/or when sitting, a frequency of: - throughout the day. Electronic Signature(s) Signed: 04/10/2019 4:56:38 PM By: Linton Ham MD Signed: 04/10/2019 5:01:35 PM By: Levan Hurst RN, BSN Entered By: Levan Hurst on 04/10/2019 13:24:01 -------------------------------------------------------------------------------- Problem List Details Patient Name: Date of Service: Kelsey Joseph, Kelsey Joseph 04/10/2019 12:30 PM Medical Record [id number] Patient Account Number: [id number] Date of Birth/Sex: Treating RN: 28-Oct-1927 (86 y.o. [name] Primary Care Provider: Alden Server Other Clinician: Referring Provider: Treating Provider/Extender:Catalea Labrecque, Towana Badger, Rutherford Limerick in Treatment: 18 Active Problems ICD-10 Evaluated Encounter Code Description Active Date Today Diagnosis S81.002A Unspecified open wound, left knee, initial encounter 11/30/2018 No Yes L97.822 Non-pressure chronic ulcer of other part of left lower 11/30/2018 No Yes leg with fat layer exposed Z96.652 Presence of left artificial knee joint 11/30/2018 No Yes I10 Essential (primary) hypertension 11/30/2018 No Yes I48.0 Paroxysmal atrial fibrillation 11/30/2018 No Yes I50.42 Chronic combined systolic (congestive)  and diastolic [id number] No Yes (congestive) heart failure Z89.611 Acquired absence of right leg above knee 11/30/2018 No Yes Inactive Problems Resolved Problems Electronic Signature(s) Signed: 04/10/2019 4:56:38 PM By: Linton Ham MD Entered By: Linton Ham on 04/10/2019 14:32:16 -------------------------------------------------------------------------------- Progress Note Details Patient Name: Date of Service: Kelsey Joseph 04/10/2019 12:30 PM Medical Record [id number] Patient Account Number: [id number] Date of Birth/Sex: Treating RN: Apr 05, 1928 (86 y.o. F) Primary Care Provider: Alden Server Other Clinician: Referring Provider: Treating Provider/Extender:Hoover Grewe, Towana Badger, Rutherford Limerick in Treatment: 18 Subjective History of Present Illness (HPI) 11/30/2018 patient presents today for initial evaluation in our clinic concerning issues that she is having with her left knee. She states that she does not really know of any specific injury that occurred and cause this ulcer. Nonetheless she does have a scratch on her leg from her dog but again she is not aware of her dog actually scratching her at this location. She does have a history of multiple knee replacements with the last being in 2006 I believe this was her third. She also has obviously a artificial knee joint on the left. She has hypertension, paroxysmal atrial fibrillation, congestive heart failure, and a right above-knee amputation. This was secondary to osteomyelitis. At this time patient is having no pain with regard to her knee at this point. Fortunately she is also having no signs of active infection at this time as far as I can see. 12/07/2018 upon evaluation today patient actually appears to be showing some signs of improvement which is good news. She still though having a little bit of granulation around the edge of the wound bed has not had as much of a improvement right in the base of the wound as I  like to see this is still very dry. Nonetheless I believe that we may want to add hydrogel to the collagen in order to allow this to heal more appropriately. She is definitely in agreement with giving this a try. The good news is there does not appear to be any signs of infection. 9/17; patient have not seen previously. 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 She has had skin grafting and also an artificial knee joint apparently from 2006 on the left. She has a small open area on the left lateral part of her knee. The exact reason for this is unclear. We have been using silver collagen with some improvement initially but not since the last visit. She is unaware of any skin issues in this area before it open. She thinks she has had this for about 2 months 9/24 absolutely no change in the condition of this wound. Almost looks like a small crater although the patient was not aware of a skin condition in this area. We have been using silver collagen absolutely no change 10/2; pathology of the nodule that did open last time suggested damage scar tissue. Also suggested the possibility of a follicle or cyst. There was no malignancy. We have been using silver collagen 10/9; wound surface is slightly smaller but no suggestion of viable tissue. Still requiring debridement we are using Iodoflex 10/23; 2-week follow-up. Her wound is somewhat better in terms of the surface although quite a bit bigger. Because of the rapid expansion and surface area I have done a shave biopsy of this now large wound. This started as a small cavitating nodule. We have been using Iodoflex I changed her to [facility] today 11/6; biopsy I did last week was negative for malignancy. PAS stain was negative. We are using Hydrofera Blue. This was felt to be dermal scar 11/30. Patient is using Hydrofera Blue to this area in the  middle of scar tissue on the left upper leg just below her knee. Wound slightly smaller. I have also been using silver nitrate on hyper granulation 12/14; she is using Hydrofera Blue. The wound is slightly smaller surface certainly looks healthier. She does have however hyper granulation which I continue to use silver nitrate to allow continued epithelialization 1/4. Using Hydrofera Blue. Measuring slightly smaller in terms of surface area Objective Constitutional Patient is hypotensive. However appears well. Pulse regular and within target range for patient.Marland Kitchen Respirations regular, non-labored and within target range.. Temperature is normal and within the target range for the patient.Marland Kitchen Appears in no distress. Vitals Time Taken: 12:45 PM, Height: 63 in, Weight: 122 lbs, BMI: 21.6, Temperature: 97.9 F, Pulse: 90 bpm, Respiratory Rate: 18 breaths/min, Blood Pressure: 93/57 mmHg, Capillary Blood Glucose: 140 mg/dl. General Notes: patient reported last CBG of 140 General Notes: Wound exam; left lateral knee just lateral to the patellar tendon. This is in the middle of scar tissue from previous grafting. Under illumination surface did not look as good as when I saw this 2 weeks ago. I used a #3 curette to take off some of the fibrinous necrotic debris. Hemostasis with direct pressure. No evidence of surrounding infection Integumentary (Hair, Skin) Wound #1 status is Open. Original cause of wound was Gradually Appeared. The wound is located on the Left Knee. The wound measures 1.2cm length x 1.5cm width x 0.2cm depth; 1.414cm^2 area and 0.283cm^3 volume. There is Fat Layer (Subcutaneous Tissue) Exposed exposed. There is no tunneling or undermining noted. There is a medium amount of serosanguineous drainage noted. The wound margin is well defined and not attached to the wound base. There is large (67-100%) red, pink, hyper - granulation within the wound bed. There is a small (1-33%) amount of  necrotic tissue within the wound bed including Adherent Slough. Assessment Active Problems ICD-10  Unspecified open wound, left knee, initial encounter Non-pressure chronic ulcer of other part of left lower leg with fat layer exposed Presence of left artificial knee joint Essential (primary) hypertension Paroxysmal atrial fibrillation Chronic combined systolic (congestive) and diastolic (congestive) heart failure Acquired absence of right leg above knee Procedures Wound #1 Pre-procedure diagnosis of Wound #1 is a Trauma, Other located on the Left Knee .Severity of Tissue Pre Debridement is: Fat layer exposed. There was a Excisional Skin/Subcutaneous Tissue Debridement with a total area of 1.8 sq cm performed by Ricard Dillon., MD. With the following instrument(s): Curette to remove Viable and Non-Viable tissue/material. Material removed includes Subcutaneous Tissue, Slough, and Biofilm after achieving pain control using Other (Benzocaine 20%). No specimens were taken. A time out was conducted at 13:20, prior to the start of the procedure. A Minimum amount of bleeding was controlled with Pressure. The procedure was tolerated well with a pain level of 0 throughout and a pain level of 0 following the procedure. Post Debridement Measurements: 1.2cm length x 1.5cm width x 0.1cm depth; 0.141cm^3 volume. Character of Wound/Ulcer Post Debridement is improved. Severity of Tissue Post Debridement is: Fat layer exposed. Post procedure Diagnosis Wound #1: Same as Pre-Procedure Plan Follow-up Appointments: Return Appointment in 2 weeks. Dressing Change Frequency: Wound #1 Left Knee: Change Dressing every other day. Wound Cleansing: Wound #1 Left Knee: May shower and wash wound with soap and water. Primary Wound Dressing: Wound #1 Left Knee: Hydrofera Blue - classic - if available Secondary Dressing: Wound #1 Left Knee: Foam Border - or large bordered gauze. Edema Control: Elevate legs  to the level of the heart or above for 30 minutes daily and/or when sitting, a frequency of: - throughout the day. 1. Continue with Hydrofera Blue. Electronic Signature(s) Signed: 04/10/2019 4:56:38 PM By: Linton Ham MD Entered By: Linton Ham on 04/10/2019 14:35:33 -------------------------------------------------------------------------------- SuperBill Details Patient Name: Date of Service: Kelsey Joseph, Kelsey Joseph 04/10/2019 Medical Record [id number] Patient Account Number: [id number] Date of Birth/Sex: Treating RN: 19-Sep-1927 (86 y.o. F) Primary Care Provider: Alden Server Other Clinician: Referring Provider: Treating Provider/Extender:Kalea Perine, Towana Badger, Rutherford Limerick in Treatment: 18 Diagnosis Coding ICD-10 Codes Code Description S81.002A Unspecified open wound, left knee, initial encounter L97.822 Non-pressure chronic ulcer of other part of left lower leg with fat layer exposed Z96.652 Presence of left artificial knee joint I10 Essential (primary) hypertension I48.0 Paroxysmal atrial fibrillation I50.42 Chronic combined systolic (congestive) and diastolic (congestive) heart failure Z89.611 Acquired absence of right leg above knee Facility Procedures The patient participates with Medicare or their insurance follows the Medicare Facility Guidelines: CPT4 Code Description Modifier Quantity [id number] 11042 - DEB SUBQ TISSUE 20 SQ CM/< 1 ICD-10 Diagnosis Description L97.822 Non-pressure chronic ulcer of  other part of left lower leg with fat layer exposed Physician Procedures CPT4 Code Description: [id number] [id number] - WC PHYS SUBQ TISS 20 SQ CM ICD-10 Diagnosis Description L97.822 Non-pressure chronic ulcer of other part of left lower leg w Modifier: ith fat laye Quantity: 1 r exposed Electronic Signature(s) Signed: 04/10/2019 4:56:38 PM By: Linton Ham MD Entered By: Linton Ham on 04/10/2019 14:35:49

## 2019-04-13 ENCOUNTER — Encounter: Payer: Self-pay | Admitting: Vascular Surgery

## 2019-04-13 ENCOUNTER — Other Ambulatory Visit: Payer: Self-pay

## 2019-04-13 ENCOUNTER — Ambulatory Visit (INDEPENDENT_AMBULATORY_CARE_PROVIDER_SITE_OTHER): Payer: Medicare Other | Admitting: Vascular Surgery

## 2019-04-13 VITALS — BP 105/62 | HR 75 | Temp 97.6°F | Resp 20 | Ht 63.25 in | Wt 120.0 lb

## 2019-04-13 DIAGNOSIS — I739 Peripheral vascular disease, unspecified: Secondary | ICD-10-CM
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
## 2019-04-18 ENCOUNTER — Other Ambulatory Visit: Payer: Self-pay | Admitting: *Deleted

## 2019-04-18 DIAGNOSIS — I739 Peripheral vascular disease, unspecified: Secondary | ICD-10-CM

## 2019-04-24 ENCOUNTER — Encounter (HOSPITAL_BASED_OUTPATIENT_CLINIC_OR_DEPARTMENT_OTHER): Payer: Medicare Other | Admitting: Internal Medicine

## 2019-04-24 ENCOUNTER — Other Ambulatory Visit: Payer: Self-pay

## 2019-04-24 DIAGNOSIS — E11622 Type 2 diabetes mellitus with other skin ulcer: Secondary | ICD-10-CM | POA: Diagnosis not present

## 2019-04-25 ENCOUNTER — Ambulatory Visit (INDEPENDENT_AMBULATORY_CARE_PROVIDER_SITE_OTHER): Payer: POS | Admitting: Podiatry

## 2019-04-25 DIAGNOSIS — B351 Tinea unguium: Secondary | ICD-10-CM | POA: Diagnosis not present

## 2019-04-25 DIAGNOSIS — I739 Peripheral vascular disease, unspecified: Secondary | ICD-10-CM

## 2019-04-25 DIAGNOSIS — T148XXA Other injury of unspecified body region, initial encounter: Secondary | ICD-10-CM

## 2019-04-25 DIAGNOSIS — M79675 Pain in left toe(s): Secondary | ICD-10-CM | POA: Diagnosis not present

## 2019-04-25 NOTE — Progress Notes (Addendum)
Kelsey Joseph ([id number]) Visit Report for 04/24/2019 Arrival Information Details Patient Name: Date of Service: Kelsey Joseph, Kelsey Joseph 04/24/2019 12:30 PM Medical Record [id number] Patient Account Number: [id number] Date of Birth/Sex: Treating RN: 1927/07/11 (86 y.o. [name] Primary Care Aland Chestnutt: Alden Server Other Clinician: Referring Daiquan Resnik: Treating Urian Martenson/Extender:Robson, Towana Badger, Rutherford Limerick in Treatment: 20 Visit Information History Since Last Visit Added or deleted any medications: No Patient Arrived: Wheel Chair Any new allergies or adverse reactions: No Arrival Time: 12:44 Had a fall or experienced change in No activities of daily living that may affect Accompanied By: self risk of falls: Transfer Assistance: None Signs or symptoms of abuse/neglect since last No Patient Identification Verified: Yes visito Secondary Verification Process Completed: Yes Hospitalized since last visit: No Patient Requires Transmission-Based No Implantable device outside of the clinic excluding No Precautions: cellular tissue based products placed in the center Patient Has Alerts: No since last visit: Has Dressing in Place as Prescribed: Yes Pain Present Now: No Electronic Signature(s) Signed: 04/24/2019 5:45:55 PM By: Kela Millin Entered By: Kela Millin on 04/24/2019 12:44:57 -------------------------------------------------------------------------------- Clinic Level of Care Assessment Details Patient Name: Date of Service: Kelsey Joseph 04/24/2019 12:30 PM Medical Record [id number] Patient Account Number: [id number] Date of Birth/Sex: Treating RN: February 12, 1928 (86 y.o. [name] Primary Care Indiyah Paone: Alden Server Other Clinician: Referring Adara Kittle: Treating Shelanda Duvall/Extender:Robson, Towana Badger, Rutherford Limerick in Treatment: 20 Clinic Level of Care Assessment Items TOOL 4 Quantity Score X - Use when only an EandM is  performed on FOLLOW-UP visit 1 0 ASSESSMENTS - Nursing Assessment / Reassessment X - Reassessment of Co-morbidities (includes updates in patient status) 1 10 X - Reassessment of Adherence to Treatment Plan 1 5 ASSESSMENTS - Wound and Skin Assessment / Reassessment X - Simple Wound Assessment / Reassessment - one wound 1 5 []  - Complex Wound Assessment / Reassessment - multiple wounds 0 []  - Dermatologic / Skin Assessment (not related to wound area) 0 ASSESSMENTS - Focused Assessment []  - Circumferential Edema Measurements - multi extremities 0 []  - Nutritional Assessment / Counseling / Intervention 0 []  - Lower Extremity Assessment (monofilament, tuning fork, pulses) 0 []  - Peripheral Arterial Disease Assessment (using hand held doppler) 0 ASSESSMENTS - Ostomy and/or Continence Assessment and Care []  - Incontinence Assessment and Management 0 []  - Ostomy Care Assessment and Management (repouching, etc.) 0 PROCESS - Coordination of Care X - Simple Patient / Family Education for ongoing care 1 15 []  - Complex (extensive) Patient / Family Education for ongoing care 0 X - Staff obtains Programmer, systems, Records, Test Results / Process Orders 1 10 []  - Staff telephones HHA, Nursing Homes / Clarify orders / etc 0 []  - Routine Transfer to another Facility (non-emergent condition) 0 []  - Routine Hospital Admission (non-emergent condition) 0 []  - New Admissions / Biomedical engineer / Ordering NPWT, Apligraf, etc. 0 []  - Emergency Hospital Admission (emergent condition) 0 X - Simple Discharge Coordination 1 10 []  - Complex (extensive) Discharge Coordination 0 PROCESS - Special Needs []  - Pediatric / Minor Patient Management 0 []  - Isolation Patient Management 0 []  - Hearing / Language / Visual special needs 0 []  - Assessment of Community assistance (transportation, D/C planning, etc.) 0 []  - Additional assistance / Altered mentation 0 []  - Support Surface(s) Assessment (bed, cushion, seat, etc.)  0 INTERVENTIONS - Wound Cleansing / Measurement X - Simple Wound Cleansing - one wound 1 5 []  - Complex Wound Cleansing - multiple wounds 0 X -  Wound Imaging (photographs - any number of wounds) 1 5 []  - Wound Tracing (instead of photographs) 0 X - Simple Wound Measurement - one wound 1 5 []  - Complex Wound Measurement - multiple wounds 0 INTERVENTIONS - Wound Dressings X - Small Wound Dressing one or multiple wounds 1 10 []  - Medium Wound Dressing one or multiple wounds 0 []  - Large Wound Dressing one or multiple wounds 0 X - Application of Medications - topical 1 5 []  - Application of Medications - injection 0 INTERVENTIONS - Miscellaneous []  - External ear exam 0 []  - Specimen Collection (cultures, biopsies, blood, body fluids, etc.) 0 []  - Specimen(s) / Culture(s) sent or taken to Lab for analysis 0 []  - Patient Transfer (multiple staff / Civil Service fast streamer / Similar devices) 0 []  - Simple Staple / Suture removal (25 or less) 0 []  - Complex Staple / Suture removal (26 or more) 0 []  - Hypo / Hyperglycemic Management (close monitor of Blood Glucose) 0 []  - Ankle / Brachial Index (ABI) - do not check if billed separately 0 X - Vital Signs 1 5 Has the patient been seen at the hospital within the last three years: Yes Total Score: 90 Level Of Care: New/Established - Level 3 Electronic Signature(s) Signed: 04/24/2019 6:02:32 PM By: Levan Hurst RN, BSN Entered By: Levan Hurst on 04/24/2019 13:06:16 -------------------------------------------------------------------------------- Encounter Discharge Information Details Patient Name: Date of Service: Kelsey Mutter. 04/24/2019 12:30 PM Medical Record [id number] Patient Account Number: [id number] Date of Birth/Sex: Treating RN: 09/04/1927 (86 y.o. [name], Meta.Reding Primary Care Oceana Walthall: Alden Server Other Clinician: Referring Liara Holm: Treating Emerald Gehres/Extender:Robson, Towana Badger, Rutherford Limerick in Treatment: 20 Encounter  Discharge Information Items Discharge Condition: Stable Ambulatory Status: Wheelchair Discharge Destination: Home Transportation: Private Auto Accompanied By: self Schedule Follow-up Appointment: Yes Clinical Summary of Care: Electronic Signature(s) Signed: 04/24/2019 5:45:39 PM By: Deon Pilling Entered By: Deon Pilling on 04/24/2019 13:09:24 -------------------------------------------------------------------------------- Lower Extremity Assessment Details Patient Name: Date of Service: KRISTIE, ZESIGER 04/24/2019 12:30 PM Medical Record [id number] Patient Account Number: [id number] Date of Birth/Sex: Treating RN: 30-Sep-1927 (86 y.o. [name] Primary Care Reegan Bouffard: Alden Server Other Clinician: Referring Kimori Tartaglia: Treating Coady Train/Extender:Robson, Towana Badger, Rutherford Limerick in Treatment: 20 Edema Assessment Assessed: [Left: No] [Right: No] Edema: [Left: N] [Right: o] Calf Left: Right: Point of Measurement: 37 cm From Medial Instep 30 cm cm Ankle Left: Right: Point of Measurement: 10 cm From Medial Instep 21.5 cm cm Vascular Assessment Pulses: Dorsalis Pedis Palpable: [Left:Yes] Electronic Signature(s) Signed: 04/24/2019 5:45:55 PM By: Kela Millin Entered By: Kela Millin on 04/24/2019 12:46:50 -------------------------------------------------------------------------------- Multi Wound Chart Details Patient Name: Date of Service: Kelsey Mutter. 04/24/2019 12:30 PM Medical Record [id number] Patient Account Number: [id number] Date of Birth/Sex: Treating RN: 1927-11-07 (86 y.o. F) Primary Care Abrina Petz: Alden Server Other Clinician: Referring Jazmina Muhlenkamp: Treating Maripat Borba/Extender:Robson, Towana Badger, Rutherford Limerick in Treatment: 20 Vital Signs Height(in): 63 Capillary Blood 148 Glucose(mg/dl): Weight(lbs): 122 Pulse(bpm): 42 Body Mass Index(BMI): 22 Blood Pressure(mmHg): 160/56 Temperature(F): 98.1 Respiratory  18 Rate(breaths/min): Photos: [1:No Photos] [N/A:N/A] Wound Location: [1:Left Knee] [N/A:N/A] Wounding Event: [1:Gradually Appeared] [N/A:N/A] Primary Etiology: [1:Trauma, Other] [N/A:N/A] Secondary Etiology: [1:Diabetic Wound/Ulcer of the N/A Lower Extremity] Comorbid History: [1:Sleep Apnea, Congestive N/A Heart Failure, Coronary Artery Disease, Type II Diabetes, Osteomyelitis, Neuropathy] Date Acquired: [1:09/09/2018] [N/A:N/A] Weeks of Treatment: [1:20] [N/A:N/A] Wound Status: [1:Open] [N/A:N/A] Measurements L x W x D 0.9x1.1x0.3 [N/A:N/A] (cm) Area (cm) : [1:0.778] [N/A:N/A] Volume (cm) : [1:0.233] [N/A:N/A] %  Reduction in Area: [1:-182.90%] [N/A:N/A] % Reduction in Volume: -323.60% [N/A:N/A] Classification: [1:Full Thickness Without Exposed Support Structures] [N/A:N/A] Exudate Amount: [1:Medium] [N/A:N/A] Exudate Type: [1:Serosanguineous] [N/A:N/A] Exudate Color: [1:red, brown] [N/A:N/A] Wound Margin: [1:Well defined, not attached N/A] Granulation Amount: [1:Large (67-100%)] [N/A:N/A] Granulation Quality: [1:Red, Pink] [N/A:N/A] Necrotic Amount: [1:Small (1-33%)] [N/A:N/A] Exposed Structures: [1:Fat Layer (Subcutaneous Tissue) Exposed: Yes Fascia: No Tendon: No Muscle: No Joint: No Bone: No Small (1-33%)] [N/A:N/A N/A] Treatment Notes Electronic Signature(s) Signed: 04/25/2019 4:08:35 PM By: Linton Ham MD Entered By: Linton Ham on 04/24/2019 13:04:04 -------------------------------------------------------------------------------- Multi-Disciplinary Care Plan Details Patient Name: Date of Service: Kelsey Mutter. 04/24/2019 12:30 PM Medical Record [id number] Patient Account Number: [id number] Date of Birth/Sex: Treating RN: 07-11-1927 (86 y.o. [name] Primary Care Cleopatra Sardo: Alden Server Other Clinician: Referring Karlynn Furrow: Treating Torie Towle/Extender:Robson, Towana Badger, Rutherford Limerick in Treatment: 20 Active Inactive Wound/Skin  Impairment Nursing Diagnoses: Impaired tissue integrity Knowledge deficit related to ulceration/compromised skin integrity Goals: Patient/caregiver will verbalize understanding of skin care regimen Date Initiated: 11/30/2018 Target Resolution Date: 05/12/2019 Goal Status: Active Ulcer/skin breakdown will have a volume reduction of 30% by week 4 Date Initiated: 11/30/2018 Date Inactivated: 01/06/2019 Target Resolution Date: 12/29/2018 Unmet Reason: debrided Goal Status: Unmet undermining Ulcer/skin breakdown will have a volume reduction of 50% by week 8 Target Resolution Date Initiated: 01/06/2019 Date Inactivated: 01/27/2019 Date: 01/27/2019 Unmet Reason: multiple Goal Status: Unmet debridements, atypical wound Interventions: Assess patient/caregiver ability to obtain necessary supplies Assess patient/caregiver ability to perform ulcer/skin care regimen upon admission and as needed Assess ulceration(s) every visit Provide education on ulcer and skin care Treatment Activities: Skin care regimen initiated : 11/30/2018 Topical wound management initiated : 11/30/2018 Notes: Electronic Signature(s) Signed: 04/24/2019 6:02:32 PM By: Levan Hurst RN, BSN Entered By: Levan Hurst on 04/24/2019 12:43:25 -------------------------------------------------------------------------------- Pain Assessment Details Patient Name: Date of Service: AAYLAH, WALEGA 04/24/2019 12:30 PM Medical Record [id number] Patient Account Number: [id number] Date of Birth/Sex: Treating RN: 1927/08/18 (86 y.o. [name] Primary Care Rennee Coyne: Alden Server Other Clinician: Referring Tanna Loeffler: Treating Corisa Montini/Extender:Robson, Towana Badger, Rutherford Limerick in Treatment: 20 Active Problems Location of Pain Severity and Description of Pain Patient Has Paino No Site Locations Pain Management and Medication Current Pain Management: Electronic Signature(s) Signed: 04/24/2019 5:45:55 PM By:  Kela Millin Entered By: Kela Millin on 04/24/2019 12:46:13 -------------------------------------------------------------------------------- Patient/Caregiver Education Details Patient Name: Date of Service: Kelsey Mutter 1/18/2021andnbsp12:30 PM Medical Record [id number] Patient Account Number: [id number] Date of Birth/Gender: Treating RN: 30-Apr-1927 (86 y.o. [name] Primary Care Physician: Alden Server Other Clinician: Referring Physician: Treating Physician/Extender:Robson, Towana Badger, Rutherford Limerick in Treatment: 20 Education Assessment Education Provided To: Patient Education Topics Provided Wound/Skin Impairment: Methods: Explain/Verbal Responses: State content correctly Electronic Signature(s) Signed: 04/24/2019 6:02:32 PM By: Levan Hurst RN, BSN Entered By: Levan Hurst on 04/24/2019 12:43:34 -------------------------------------------------------------------------------- Wound Assessment Details Patient Name: Date of Service: SYMONE, COLLADO 04/24/2019 12:30 PM Medical Record [id number] Patient Account Number: [id number] Date of Birth/Sex: Treating RN: May 11, 1927 (86 y.o. [name] Primary Care Kailin Leu: Alden Server Other Clinician: Referring Ressie Slevin: Treating Quaneisha Hanisch/Extender:Robson, Towana Badger, Rutherford Limerick in Treatment: 20 Wound Status Wound Number: 1 Primary Trauma, Other Etiology: Wound Location: Left Knee Secondary Diabetic Wound/Ulcer of the Lower Extremity Wounding Event: Gradually Appeared Etiology: Date Acquired: 09/09/2018 Wound Open Weeks Of Treatment: 20 Status: Clustered Wound: No Comorbid Sleep Apnea, Congestive Heart Failure, History: Coronary Artery Disease, Type II Diabetes, Osteomyelitis, Neuropathy Photos Wound Measurements Length: (  cm) 0.9 % Reduct Width: (cm) 1.1 % Reduct Depth: (cm) 0.3 Epitheli Area: (cm) 0.778 Tunneli Volume: (cm) 0.233 Undermi Wound  Description Classification: Full Thickness Without Exposed Support Foul Odo Structures Slough/F Wound Well defined, not attached Margin: Exudate Medium Amount: Exudate Serosanguineous Type: Exudate red, brown Color: Wound Bed Granulation Amount: Large (67-100%) Granulation Quality: Red, Pink Fascia E Necrotic Amount: Small (1-33%) Fat Laye Necrotic Quality: Adherent Slough Tendon E Muscle E Joint Ex Bone Exp r After Cleansing: No ibrino Yes Exposed Structure xposed: No r (Subcutaneous Tissue) Exposed: Yes xposed: No xposed: No posed: No osed: No ion in Area: -182.9% ion in Volume: -323.6% alization: Small (1-33%) ng: No ning: No Treatment Notes Wound #1 (Left Knee) 1. Cleanse With Wound Cleanser 2. Periwound Care Skin Prep 3. Primary Dressing Applied Hydrofera Blue 4. Secondary Dressing Foam Border Dressing 5. Secured With Office manager) Signed: 04/28/2019 5:14:05 PM By: Mikeal Hawthorne EMT/HBOT Signed: 04/28/2019 5:39:40 PM By: Kela Millin Previous Signature: 04/24/2019 5:45:55 PM Version By: Kela Millin Entered By: Mikeal Hawthorne on 04/28/2019 15:31:52 -------------------------------------------------------------------------------- Vitals Details Patient Name: Date of Service: TAMILIA, STOYER 04/24/2019 12:30 PM Medical Record [id number] Patient Account Number: [id number] Date of Birth/Sex: Treating RN: 02/11/1928 (86 y.o. [name] Primary Care Anniemae Haberkorn: Alden Server Other Clinician: Referring Angele Wiemann: Treating Shahana Capes/Extender:Robson, Towana Badger, Rutherford Limerick in Treatment: 20 Vital Signs Time Taken: 12:40 Temperature (F): 98.1 Height (in): 63 Pulse (bpm): 67 Weight (lbs): 122 Respiratory Rate (breaths/min): 18 Body Mass Index (BMI): 21.6 Blood Pressure (mmHg): 160/56 Capillary Blood Glucose (mg/dl): 148 Reference Range: 80 - 120 mg / dl Notes patient stated last CBG was  140 Electronic Signature(s) Signed: 04/24/2019 5:45:55 PM By: Kela Millin Entered By: Kela Millin on 04/24/2019 12:46:07

## 2019-04-25 NOTE — Progress Notes (Signed)
Kelsey Joseph, Kelsey Joseph ([id number]) Visit Report for 04/24/2019 HPI Details Patient Name: Date of Service: Kelsey Joseph, Kelsey Joseph 04/24/2019 12:30 PM Medical Record [id number] Patient Account Number: [id number] Date of Birth/Sex: Treating RN: 09/03/1927 (86 y.o. F) Primary Care Provider: Alden Server Other Clinician: Referring Provider: Treating Provider/Extender:Lenetta Piche, Towana Badger, Rutherford Limerick in Treatment: 20 History of Present Illness HPI Description: 11/30/2018 patient presents today for initial evaluation in our clinic concerning issues that she is having with her left knee. She states that she does not really know of any specific injury that occurred and cause this ulcer. Nonetheless she does have a scratch on her leg from her dog but again she is not aware of her dog actually scratching her at this location. She does have a history of multiple knee replacements with the last being in 2006 I believe this was her third. She also has obviously a artificial knee joint on the left. She has hypertension, paroxysmal atrial fibrillation, congestive heart failure, and a right above-knee amputation. This was secondary to osteomyelitis. At this time patient is having no pain with regard to her knee at this point. Fortunately she is also having no signs of active infection at this time as far as I can see. 12/07/2018 upon evaluation today patient actually appears to be showing some signs of improvement which is good news. She still though having a little bit of granulation around the edge of the wound bed has not had as much of a improvement right in the base of the wound as I like to see this is still very dry. Nonetheless I believe that we may want to add hydrogel to the collagen in order to allow this to heal more appropriately. She is definitely in agreement with giving this a try. The good news is there does not appear to be any signs of infection. 9/17; patient have not seen previously. 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 She has had skin grafting and also an artificial knee joint apparently from 2006 on the left. She has a small open area on the left lateral part of her knee. The exact reason for this is unclear. We have been using silver collagen with some improvement initially but not since the last visit. She is unaware of any skin issues in this area before it open. She thinks she has had this for about 2 months 9/24 absolutely no change in the condition of this wound. Almost looks like a small crater although the patient was not aware of a skin condition in this area. We have been using silver collagen absolutely no change 10/2; pathology of the nodule that did open last time suggested damage scar tissue. Also suggested the possibility of a follicle or cyst. There was no malignancy. We have been using silver collagen 10/9; wound surface is slightly smaller but no suggestion of viable tissue. Still requiring debridement we are using Iodoflex 10/23; 2-week follow-up. Her wound is somewhat better in terms of the surface although quite a bit bigger. Because of the rapid expansion and surface area I have done a shave biopsy of this now large wound. This started as a small cavitating nodule. We have been using Iodoflex I changed her to [facility] today 11/6; biopsy I did last week was negative for malignancy. PAS stain was negative. We are using Hydrofera Blue. This was felt to be dermal scar 11/30. Patient is using  Hydrofera Blue to this area in the middle of scar tissue on the left upper leg just below her knee. Wound slightly smaller. I have also been using silver nitrate on hyper granulation 12/14; she is using Hydrofera Blue. The wound is slightly smaller surface certainly looks healthier. She does have however hyper granulation which I continue to use silver nitrate to  allow continued epithelialization 1/4. Using Hydrofera Blue. Measuring slightly smaller in terms of surface area 1/18; still using Hydrofera Blue smaller in terms of surface area. Electronic Signature(s) Signed: 04/25/2019 4:08:35 PM By: Linton Ham MD Entered By: Linton Ham on 04/24/2019 13:05:16 -------------------------------------------------------------------------------- Physical Exam Details Patient Name: Date of Service: Kelsey Joseph, Kelsey Joseph 04/24/2019 12:30 PM Medical Record [id number] Patient Account Number: [id number] Date of Birth/Sex: Treating RN: [birth year] (86 y.o. F) Primary Care Provider: Alden Server Other Clinician: Referring Provider: Treating Provider/Extender:Lavoy Bernards, Towana Badger, Rutherford Limerick in Treatment: 71 Constitutional Patient is hypertensive.. Pulse regular and within target range for patient.Marland Kitchen Respirations regular, non-labored and within target range.. Temperature is normal and within the target range for the patient.Marland Kitchen Appears in no distress. Notes Wound exam; left lateral knee just lateral to the patellar tendon. This is in the middle of scar tissue from previous grafting. This is smaller however raised edges this week and somewhat of a dry surface. We will have to see if this continues no erythema around the wound Electronic Signature(s) Signed: 04/25/2019 4:08:35 PM By: Linton Ham MD Entered By: Linton Ham on 04/24/2019 13:07:01 -------------------------------------------------------------------------------- Physician Orders Details Patient Name: Date of Service: Kelsey Joseph, Kelsey Joseph 04/24/2019 12:30 PM Medical Record [id number] Patient Account Number: [id number] Date of Birth/Sex: Treating RN: 11/19/1927 (86 y.o. [name] Primary Care Provider: Alden Server Other Clinician: Referring Provider: Treating Provider/Extender:Jovanne Riggenbach, Towana Badger, Rutherford Limerick in Treatment: 20 Verbal / Phone Orders:  No Diagnosis Coding ICD-10 Coding Code Description S81.002A Unspecified open wound, left knee, initial encounter L97.822 Non-pressure chronic ulcer of other part of left lower leg with fat layer exposed Z96.652 Presence of left artificial knee joint I10 Essential (primary) hypertension I48.0 Paroxysmal atrial fibrillation I50.42 Chronic combined systolic (congestive) and diastolic (congestive) heart failure Z89.611 Acquired absence of right leg above knee Follow-up Appointments Return Appointment in 2 weeks. Dressing Change Frequency Wound #1 Left Knee Change Dressing every other day. Wound Cleansing Wound #1 Left Knee May shower and wash wound with soap and water. Primary Wound Dressing Wound #1 Left Knee Hydrofera Blue - classic - if available Secondary Dressing Wound #1 Left Knee Foam Border - or large bordered gauze. Edema Control Elevate legs to the level of the heart or above for 30 minutes daily and/or when sitting, a frequency of: - throughout the day. Electronic Signature(s) Signed: 04/24/2019 6:02:32 PM By: Levan Hurst RN, BSN Signed: 04/25/2019 4:08:35 PM By: Linton Ham MD Entered By: Levan Hurst on 04/24/2019 12:43:11 -------------------------------------------------------------------------------- Problem List Details Patient Name: Date of Service: Kelsey Joseph, Kelsey Joseph 04/24/2019 12:30 PM Medical Record [id number] Patient Account Number: [id number] Date of Birth/Sex: Treating RN: May 20, 1927 (86 y.o. [name] Primary Care Provider: Alden Server Other Clinician: Referring Provider: Treating Provider/Extender:Trenese Haft, Towana Badger, Rutherford Limerick in Treatment: 20 Active Problems ICD-10 Evaluated Encounter Code Description Active Date Today Diagnosis S81.002A Unspecified open wound, left knee, initial encounter 11/30/2018 No Yes L97.822 Non-pressure chronic ulcer of other part of left lower 11/30/2018 No Yes leg with fat layer  exposed Z96.652 Presence of left artificial knee joint 11/30/2018 No Yes I10 Essential (primary) hypertension  11/30/2018 No Yes I48.0 Paroxysmal atrial fibrillation 11/30/2018 No Yes I50.42 Chronic combined systolic (congestive) and diastolic [id number] No Yes (congestive) heart failure Z89.611 Acquired absence of right leg above knee 11/30/2018 No Yes Inactive Problems Resolved Problems Electronic Signature(s) Signed: 04/25/2019 4:08:35 PM By: Linton Ham MD Entered By: Linton Ham on 04/24/2019 13:03:50 -------------------------------------------------------------------------------- Progress Note Details Patient Name: Date of Service: Kelsey Joseph 04/24/2019 12:30 PM Medical Record [id number] Patient Account Number: [id number] Date of Birth/Sex: Treating RN: 1927-05-09 (86 y.o. F) Primary Care Provider: Alden Server Other Clinician: Referring Provider: Treating Provider/Extender:Zygmunt Mcglinn, Towana Badger, Rutherford Limerick in Treatment: 20 Subjective History of Present Illness (HPI) 11/30/2018 patient presents today for initial evaluation in our clinic concerning issues that she is having with her left knee. She states that she does not really know of any specific injury that occurred and cause this ulcer. Nonetheless she does have a scratch on her leg from her dog but again she is not aware of her dog actually scratching her at this location. She does have a history of multiple knee replacements with the last being in 2006 I believe this was her third. She also has obviously a artificial knee joint on the left. She has hypertension, paroxysmal atrial fibrillation, congestive heart failure, and a right above-knee amputation. This was secondary to osteomyelitis. At this time patient is having no pain with regard to her knee at this point. Fortunately she is also having no signs of active infection at this time as far as I can see. 12/07/2018 upon evaluation today patient  actually appears to be showing some signs of improvement which is good news. She still though having a little bit of granulation around the edge of the wound bed has not had as much of a improvement right in the base of the wound as I like to see this is still very dry. Nonetheless I believe that we may want to add hydrogel to the collagen in order to allow this to heal more appropriately. She is definitely in agreement with giving this a try. The good news is there does not appear to be any signs of infection. 9/17; patient have not seen previously. 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 She has had skin grafting and also an artificial knee joint apparently from 2006 on the left. She has a small open area on the left lateral part of her knee. The exact reason for this is unclear. We have been using silver collagen with some improvement initially but not since the last visit. She is unaware of any skin issues in this area before it open. She thinks she has had this for about 2 months 9/24 absolutely no change in the condition of this wound. Almost looks like a small crater although the patient was not aware of a skin condition in this area. We have been using silver collagen absolutely no change 10/2; pathology of the nodule that did open last time suggested damage scar tissue. Also suggested the possibility of a follicle or cyst. There was no malignancy. We have been using silver collagen 10/9; wound surface is slightly smaller but no suggestion of viable tissue. Still requiring debridement we are using Iodoflex 10/23; 2-week follow-up. Her wound is somewhat better in terms of the surface although quite a bit bigger. Because of the rapid expansion and surface area I have done a shave biopsy of this now large wound.  This started as a small cavitating nodule. We have been using Iodoflex I changed  her to [facility] today 11/6; biopsy I did last week was negative for malignancy. PAS stain was negative. We are using Hydrofera Blue. This was felt to be dermal scar 11/30. Patient is using Hydrofera Blue to this area in the middle of scar tissue on the left upper leg just below her knee. Wound slightly smaller. I have also been using silver nitrate on hyper granulation 12/14; she is using Hydrofera Blue. The wound is slightly smaller surface certainly looks healthier. She does have however hyper granulation which I continue to use silver nitrate to allow continued epithelialization 1/4. Using Hydrofera Blue. Measuring slightly smaller in terms of surface area 1/18; still using Hydrofera Blue smaller in terms of surface area. Objective Constitutional Patient is hypertensive.. Pulse regular and within target range for patient.Marland Kitchen Respirations regular, non-labored and within target range.. Temperature is normal and within the target range for the patient.Marland Kitchen Appears in no distress. Vitals Time Taken: 12:40 PM, Height: 63 in, Weight: 122 lbs, BMI: 21.6, Temperature: 98.1 F, Pulse: 67 bpm, Respiratory Rate: 18 breaths/min, Blood Pressure: 160/56 mmHg, Capillary Blood Glucose: 148 mg/dl. General Notes: patient stated last CBG was 140 General Notes: Wound exam; left lateral knee just lateral to the patellar tendon. This is in the middle of scar tissue from previous grafting. This is smaller however raised edges this week and somewhat of a dry surface. We will have to see if this continues no erythema around the wound Integumentary (Hair, Skin) Wound #1 status is Open. Original cause of wound was Gradually Appeared. The wound is located on the Left Knee. The wound measures 0.9cm length x 1.1cm width x 0.3cm depth; 0.778cm^2 area and 0.233cm^3 volume. There is Fat Layer (Subcutaneous Tissue) Exposed exposed. There is no tunneling or undermining noted. There is a medium amount of serosanguineous  drainage noted. The wound margin is well defined and not attached to the wound base. There is large (67-100%) red, pink granulation within the wound bed. There is a small (1-33%) amount of necrotic tissue within the wound bed including Adherent Slough. Assessment Active Problems ICD-10 Unspecified open wound, left knee, initial encounter Non-pressure chronic ulcer of other part of left lower leg with fat layer exposed Presence of left artificial knee joint Essential (primary) hypertension Paroxysmal atrial fibrillation Chronic combined systolic (congestive) and diastolic (congestive) heart failure Acquired absence of right leg above knee Plan Follow-up Appointments: Return Appointment in 2 weeks. Dressing Change Frequency: Wound #1 Left Knee: Change Dressing every other day. Wound Cleansing: Wound #1 Left Knee: May shower and wash wound with soap and water. Primary Wound Dressing: Wound #1 Left Knee: Hydrofera Blue - classic - if available Secondary Dressing: Wound #1 Left Knee: Foam Border - or large bordered gauze. Edema Control: Elevate legs to the level of the heart or above for 30 minutes daily and/or when sitting, a frequency of: - throughout the day. 1. I am continuing with Hydrofera Blue as the primary dressing as the wound appears to be contracting 2. The patient changes this resolved every second day 3. There is some circumferential raised edges these may need debridement we will have to see how this does next time in terms of overall wound volume Electronic Signature(s) Signed: 04/25/2019 4:08:35 PM By: Linton Ham MD Entered By: Linton Ham on 04/24/2019 13:07:55 -------------------------------------------------------------------------------- SuperBill Details Patient Name: Date of Service: Kelsey Joseph 04/24/2019 Medical Record [id number] Patient Account Number: [id number] Date  of Birth/Sex: Treating RN: 02/22/1928 (86 y.o. [name] Primary Care Provider: Alden Server Other Clinician: Referring Provider: Treating Provider/Extender:Raelin Pixler, Towana Badger, Rutherford Limerick in Treatment: 20 Diagnosis Coding ICD-10 Codes Code Description S81.002A Unspecified open wound, left knee, initial encounter L97.822 Non-pressure chronic ulcer of other part of left lower leg with fat layer exposed Z96.652 Presence of left artificial knee joint I10 Essential (primary) hypertension I48.0 Paroxysmal atrial fibrillation I50.42 Chronic combined systolic (congestive) and diastolic (congestive) heart failure Z89.611 Acquired absence of right leg above knee Facility Procedures The patient participates with Medicare or their insurance follows the Medicare Facility Guidelines: CPT4 Code Description Modifier Quantity [id number] [place] VISIT-LEV 3 EST PT 1 Physician Procedures CPT4 Code Description: [id number] - WC PHYS LEVEL 3 - EST PT ICD-10 Diagnosis Description L97.822 Non-pressure chronic ulcer of other part of left lower leg Modifier: with fat laye Quantity: 1 r exposed Electronic Signature(s) Signed: 04/25/2019 4:08:35 PM By: Linton Ham MD Entered By: Linton Ham on 04/24/2019 13:08:35

## 2019-04-30 NOTE — Progress Notes (Signed)
Subjective: 86 y.o. returns the office today for painful, elongated, thickened toenails which she cannot trim herself. Denies any redness or drainage around the nails. Denies any acute changes since last appointment and no new complaints today. Denies any systemic complaints such as fevers, chills, nausea, vomiting.   PCP: [name], MD   Objective: AAO 3, NAD DP/PT pulses palpable, CRT less than 3 seconds Nails hypertrophic, dystrophic, elongated, brittle, discolored 5. There is subjective irritation to the nails 1-5 bilaterally. There is no surrounding erythema or drainage along the nail sites. Small superficial abrasion on the dorsal aspect of left foot with any edema, erythema, increase in warmth.  No signs of infection.  It appears extremely scabbed over. No open lesions or pre-ulcerative lesions are identified. No other areas of tenderness bilateral lower extremities. No overlying edema, erythema, increased warmth. No pain with calf compression, swelling, warmth, erythema.  Assessment: Patient presents with symptomatic onychomycosis; PAD with history of amputation   Plan: -Treatment options including alternatives, risks, complications were discussed -Nails sharply debrided 5 without complication/bleeding. -Continue a small amount of antibiotic ointment on the left foot abrasion daily.  Monitoring signs or symptoms of infection.  If not healed next 2 weeks to let me know or sooner if any issues.  She is also going to wound care center for another issue on her leg around the knee and recommended her to show them the left foot abrasion as well. -Discussed daily foot inspection. If there are any changes, to call the office immediately.  -Follow-up in 3 months or sooner if any problems are to arise. In the meantime, encouraged to call the office with any questions, concerns, changes symptoms.  Celesta Gentile, DPM

## 2019-05-01 ENCOUNTER — Ambulatory Visit (INDEPENDENT_AMBULATORY_CARE_PROVIDER_SITE_OTHER): Payer: Medicare Other | Admitting: Cardiovascular Disease

## 2019-05-01 ENCOUNTER — Encounter: Payer: Self-pay | Admitting: Cardiovascular Disease

## 2019-05-01 ENCOUNTER — Other Ambulatory Visit: Payer: Self-pay

## 2019-05-01 VITALS — BP 156/60 | HR 64 | Ht 63.25 in | Wt 130.0 lb

## 2019-05-01 DIAGNOSIS — E7800 Pure hypercholesterolemia, unspecified: Secondary | ICD-10-CM

## 2019-05-01 DIAGNOSIS — I10 Essential (primary) hypertension: Secondary | ICD-10-CM

## 2019-05-01 DIAGNOSIS — I25118 Atherosclerotic heart disease of native coronary artery with other forms of angina pectoris: Secondary | ICD-10-CM

## 2019-05-01 DIAGNOSIS — I5032 Chronic diastolic (congestive) heart failure: Secondary | ICD-10-CM

## 2019-05-01 DIAGNOSIS — I495 Sick sinus syndrome: Secondary | ICD-10-CM

## 2019-05-01 DIAGNOSIS — I480 Paroxysmal atrial fibrillation: Secondary | ICD-10-CM | POA: Diagnosis not present

## 2019-05-01 DIAGNOSIS — Z950 Presence of cardiac pacemaker: Secondary | ICD-10-CM

## 2019-05-01 DIAGNOSIS — I739 Peripheral vascular disease, unspecified: Secondary | ICD-10-CM

## 2019-05-01 DIAGNOSIS — D500 Iron deficiency anemia secondary to blood loss (chronic): Secondary | ICD-10-CM

## 2019-05-01 MED ORDER — COQ10 100 MG PO CAPS: 300.0000 mg | ORAL_CAPSULE | Freq: Every day | ORAL | Status: DC
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
## 2019-05-01 NOTE — Patient Instructions (Signed)
Medication Instructions:  START CoQ10- 300 mg once daily. This can be bought over the counter.  *If you need a refill on your cardiac medications before your next appointment, please call your pharmacy*  Lab Work: None ordered If you have labs (blood work) drawn today and your tests are completely normal, you will receive your results only by: Marland Kitchen MyChart Message (if you have MyChart) OR . A paper copy in the mail If you have any lab test that is abnormal or we need to change your treatment, we will call you to review the results.  Testing/Procedures: None ordered  Follow-Up: At [facility], you and your health needs are our priority.  As part of our continuing mission to provide you with exceptional heart care, we have created designated Provider Care Teams.  These Care Teams include your primary Cardiologist (physician) and Advanced Practice Providers (APPs -  Physician Assistants and Nurse Practitioners) who all work together to provide you with the care you need, when you need it.  Your next appointment:   6 month(s)  The format for your next appointment:   In Person  Provider:   Sanda Klein, MD  Other Instructions Dr. Sallyanne Kuster would like you to check your blood pressure daily for the next 2 weeks.  Keep a journal of these daily blood pressure and heart rate readings and call our office or send a message through [place] with the results. Thank you!

## 2019-05-05 ENCOUNTER — Other Ambulatory Visit: Payer: Self-pay | Admitting: Cardiovascular Disease

## 2019-05-08 ENCOUNTER — Encounter (HOSPITAL_BASED_OUTPATIENT_CLINIC_OR_DEPARTMENT_OTHER): Payer: Medicare Other | Admitting: Internal Medicine

## 2019-05-08 ENCOUNTER — Other Ambulatory Visit: Payer: Self-pay

## 2019-05-08 DIAGNOSIS — L97822 Non-pressure chronic ulcer of other part of left lower leg with fat layer exposed: Secondary | ICD-10-CM | POA: Insufficient documentation

## 2019-05-08 DIAGNOSIS — Z89611 Acquired absence of right leg above knee: Secondary | ICD-10-CM | POA: Insufficient documentation

## 2019-05-08 DIAGNOSIS — I5042 Chronic combined systolic (congestive) and diastolic (congestive) heart failure: Secondary | ICD-10-CM | POA: Diagnosis not present

## 2019-05-08 DIAGNOSIS — I110 Hypertensive heart disease with heart failure: Secondary | ICD-10-CM | POA: Diagnosis not present

## 2019-05-08 DIAGNOSIS — Z96652 Presence of left artificial knee joint: Secondary | ICD-10-CM | POA: Diagnosis not present

## 2019-05-08 NOTE — Progress Notes (Signed)
Kelsey Joseph, Kelsey Joseph ([id number]) Visit Report for 05/08/2019 HPI Details Patient Name: Date of Service: Kelsey Joseph, Kelsey Joseph 05/08/2019 12:30 PM Medical Record [id number] Patient Account Number: [id number] Date of Birth/Sex: Treating RN: [birth year] (86 y.o. F) Primary Care Provider: Alden Server Other Clinician: Referring Provider: Treating Provider/Extender:Dameka Younker, Towana Badger, Rutherford Limerick in Treatment: 22 History of Present Illness HPI Description: 11/30/2018 patient presents today for initial evaluation in our clinic concerning issues that she is having with her left knee. She states that she does not really know of any specific injury that occurred and cause this ulcer. Nonetheless she does have a scratch on her leg from her dog but again she is not aware of her dog actually scratching her at this location. She does have a history of multiple knee replacements with the last being in 2006 I believe this was her third. She also has obviously a artificial knee joint on the left. She has hypertension, paroxysmal atrial fibrillation, congestive heart failure, and a right above-knee amputation. This was secondary to osteomyelitis. At this time patient is having no pain with regard to her knee at this point. Fortunately she is also having no signs of active infection at this time as far as I can see. 12/07/2018 upon evaluation today patient actually appears to be showing some signs of improvement which is good news. She still though having a little bit of granulation around the edge of the wound bed has not had as much of a improvement right in the base of the wound as I like to see this is still very dry. Nonetheless I believe that we may want to add hydrogel to the collagen in order to allow this to heal more appropriately. She is definitely in agreement with giving this a try. The good news is there does not appear to be any signs of infection. 9/17; patient have not seen previously. 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 She has had skin grafting and also an artificial knee joint apparently from 2006 on the left. She has a small open area on the left lateral part of her knee. The exact reason for this is unclear. We have been using silver collagen with some improvement initially but not since the last visit. She is unaware of any skin issues in this area before it open. She thinks she has had this for about 2 months 9/24 absolutely no change in the condition of this wound. Almost looks like a small crater although the patient was not aware of a skin condition in this area. We have been using silver collagen absolutely no change 10/2; pathology of the nodule that did open last time suggested damage scar tissue. Also suggested the possibility of a follicle or cyst. There was no malignancy. We have been using silver collagen 10/9; wound surface is slightly smaller but no suggestion of viable tissue. Still requiring debridement we are using Iodoflex 10/23; 2-week follow-up. Her wound is somewhat better in terms of the surface although quite a bit bigger. Because of the rapid expansion and surface area I have done a shave biopsy of this now large wound. This started as a small cavitating nodule. We have been using Iodoflex I changed her to [facility] today 11/6; biopsy I did last week was negative for malignancy. PAS stain was negative. We are using Hydrofera Blue. This was felt to be dermal scar 11/30. Patient is using  Hydrofera Blue to this area in the middle of scar tissue on the left upper leg just below her knee. Wound slightly smaller. I have also been using silver nitrate on hyper granulation 12/14; she is using Hydrofera Blue. The wound is slightly smaller surface certainly looks healthier. She does have however hyper granulation which I continue to use silver nitrate to  allow continued epithelialization 1/4. Using Hydrofera Blue. Measuring slightly smaller in terms of surface area 1/18; still using Hydrofera Blue smaller in terms of surface area. 2/1; using Hydrofera Blue. Surface area improvement Electronic Signature(s) Signed: 05/08/2019 6:41:12 PM By: Linton Ham MD Entered By: Linton Ham on 05/08/2019 13:57:19 -------------------------------------------------------------------------------- Physical Exam Details Patient Name: Date of Service: Kelsey Joseph, Kelsey Joseph 05/08/2019 12:30 PM Medical Record [id number] Patient Account Number: [id number] Date of Birth/Sex: Treating RN: February 07, 1928 (86 y.o. F) Primary Care Provider: Alden Server Other Clinician: Referring Provider: Treating Provider/Extender:Abeera Flannery, Towana Badger, Rutherford Limerick in Treatment: 22 Constitutional Sitting or standing Blood Pressure is within target range for patient.. Pulse regular and within target range for patient.Marland Kitchen Respirations regular, non-labored and within target range.. Temperature is normal and within the target range for the patient.Marland Kitchen Appears in no distress. Integumentary (Hair, Skin) No erythema around the wound. Notes Wound exam; we continue to make good improvement here. The area is on the left lateral knee just lateral to the patellar tendon. This is in the middle of skin grafting scar. Edges look better than last week. Wound is smaller central part of the wound still has some depth however. There is no surrounding infection Electronic Signature(s) Signed: 05/08/2019 6:41:12 PM By: Linton Ham MD Entered By: Linton Ham on 05/08/2019 13:59:02 -------------------------------------------------------------------------------- Physician Orders Details Patient Name: Date of Service: Kelsey Joseph, Kelsey Joseph 05/08/2019 12:30 PM Medical Record [id number] Patient Account Number: [id number] Date of Birth/Sex: Treating RN: 09-Aug-1927 (86 y.o. [name] Primary Care Provider: Alden Server Other Clinician: Referring Provider: Treating Provider/Extender:Romaine Neville, Towana Badger, Rutherford Limerick in Treatment: 22 Verbal / Phone Orders: No Diagnosis Coding ICD-10 Coding Code Description S81.002A Unspecified open wound, left knee, initial encounter L97.822 Non-pressure chronic ulcer of other part of left lower leg with fat layer exposed Z96.652 Presence of left artificial knee joint I10 Essential (primary) hypertension I48.0 Paroxysmal atrial fibrillation I50.42 Chronic combined systolic (congestive) and diastolic (congestive) heart failure Z89.611 Acquired absence of right leg above knee Follow-up Appointments Return Appointment in 2 weeks. Dressing Change Frequency Wound #1 Left Knee Change Dressing every other day. Wound Cleansing Wound #1 Left Knee May shower and wash wound with soap and water. Primary Wound Dressing Wound #1 Left Knee Hydrofera Blue - classic - if available Secondary Dressing Wound #1 Left Knee Foam Border - or large bordered gauze. Edema Control Elevate legs to the level of the heart or above for 30 minutes daily and/or when sitting, a frequency of: - throughout the day. Electronic Signature(s) Signed: 05/08/2019 6:39:44 PM By: Levan Hurst RN, BSN Signed: 05/08/2019 6:41:12 PM By: Linton Ham MD Entered By: Levan Hurst on 05/08/2019 13:30:02 -------------------------------------------------------------------------------- Problem List Details Patient Name: Date of Service: Kelsey Joseph, Kelsey Joseph 05/08/2019 12:30 PM Medical Record [id number] Patient Account Number: [id number] Date of Birth/Sex: Treating RN: [birth year] (86 y.o. [name] Primary Care Provider: Alden Server Other Clinician: Referring Provider: Treating Provider/Extender:Robie Mcniel, Towana Badger, Rutherford Limerick in Treatment: 22 Active Problems ICD-10 Evaluated Encounter Code Description Active Date Today  Diagnosis S81.002A Unspecified open wound, left knee, initial encounter 11/30/2018 No Yes L97.822 Non-pressure  chronic ulcer of other part of left lower 11/30/2018 No Yes leg with fat layer exposed Z96.652 Presence of left artificial knee joint 11/30/2018 No Yes I10 Essential (primary) hypertension 11/30/2018 No Yes I48.0 Paroxysmal atrial fibrillation 11/30/2018 No Yes I50.42 Chronic combined systolic (congestive) and diastolic [id number] No Yes (congestive) heart failure Z89.611 Acquired absence of right leg above knee 11/30/2018 No Yes Inactive Problems Resolved Problems Electronic Signature(s) Signed: 05/08/2019 6:41:12 PM By: Linton Ham MD Entered By: Linton Ham on 05/08/2019 13:56:54 -------------------------------------------------------------------------------- Progress Note Details Patient Name: Date of Service: Kelsey Joseph 05/08/2019 12:30 PM Medical Record [id number] Patient Account Number: [id number] Date of Birth/Sex: Treating RN: 11/24/1927 (86 y.o. F) Primary Care Provider: Alden Server Other Clinician: Referring Provider: Treating Provider/Extender:Youa Deloney, Towana Badger, Rutherford Limerick in Treatment: 22 Subjective History of Present Illness (HPI) 11/30/2018 patient presents today for initial evaluation in our clinic concerning issues that she is having with her left knee. She states that she does not really know of any specific injury that occurred and cause this ulcer. Nonetheless she does have a scratch on her leg from her dog but again she is not aware of her dog actually scratching her at this location. She does have a history of multiple knee replacements with the last being in 2006 I believe this was her third. She also has obviously a artificial knee joint on the left. She has hypertension, paroxysmal atrial fibrillation, congestive heart failure, and a right above-knee amputation. This was secondary to osteomyelitis. At this time patient is having  no pain with regard to her knee at this point. Fortunately she is also having no signs of active infection at this time as far as I can see. 12/07/2018 upon evaluation today patient actually appears to be showing some signs of improvement which is good news. She still though having a little bit of granulation around the edge of the wound bed has not had as much of a improvement right in the base of the wound as I like to see this is still very dry. Nonetheless I believe that we may want to add hydrogel to the collagen in order to allow this to heal more appropriately. She is definitely in agreement with giving this a try. The good news is there does not appear to be any signs of infection. 9/17; patient have not seen previously. 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 She has had skin grafting and also an artificial knee joint apparently from 2006 on the left. She has a small open area on the left lateral part of her knee. The exact reason for this is unclear. We have been using silver collagen with some improvement initially but not since the last visit. She is unaware of any skin issues in this area before it open. She thinks she has had this for about 2 months 9/24 absolutely no change in the condition of this wound. Almost looks like a small crater although the patient was not aware of a skin condition in this area. We have been using silver collagen absolutely no change 10/2; pathology of the nodule that did open last time suggested damage scar tissue. Also suggested the possibility of a follicle or cyst. There was no malignancy. We have been using silver collagen 10/9; wound surface is slightly smaller but no suggestion of viable tissue. Still requiring debridement we are using Iodoflex 10/23; 2-week follow-up. Her wound is somewhat  better in terms of the surface although quite a bit bigger.  Because of the rapid expansion and surface area I have done a shave biopsy of this now large wound. This started as a small cavitating nodule. We have been using Iodoflex I changed her to [facility] today 11/6; biopsy I did last week was negative for malignancy. PAS stain was negative. We are using Hydrofera Blue. This was felt to be dermal scar 11/30. Patient is using Hydrofera Blue to this area in the middle of scar tissue on the left upper leg just below her knee. Wound slightly smaller. I have also been using silver nitrate on hyper granulation 12/14; she is using Hydrofera Blue. The wound is slightly smaller surface certainly looks healthier. She does have however hyper granulation which I continue to use silver nitrate to allow continued epithelialization 1/4. Using Hydrofera Blue. Measuring slightly smaller in terms of surface area 1/18; still using Hydrofera Blue smaller in terms of surface area. 2/1; using Hydrofera Blue. Surface area improvement Objective Constitutional Sitting or standing Blood Pressure is within target range for patient.. Pulse regular and within target range for patient.Marland Kitchen Respirations regular, non-labored and within target range.. Temperature is normal and within the target range for the patient.Marland Kitchen Appears in no distress. Vitals Time Taken: 1:13 PM, Height: 63 in, Weight: 122 lbs, BMI: 21.6, Temperature: 98.5 F, Pulse: 55 bpm, Respiratory Rate: 20 breaths/min, Blood Pressure: 117/60 mmHg, Capillary Blood Glucose: 140 mg/dl. General Notes: Wound exam; we continue to make good improvement here. The area is on the left lateral knee just lateral to the patellar tendon. This is in the middle of skin grafting scar. Edges look better than last week. Wound is smaller central part of the wound still has some depth however. There is no surrounding infection Integumentary (Hair, Skin) No erythema around the wound. Wound #1 status is Open. Original cause of wound was  Gradually Appeared. The wound is located on the Left Knee. The wound measures 0.6cm length x 0.6cm width x 0.2cm depth; 0.283cm^2 area and 0.057cm^3 volume. There is Fat Layer (Subcutaneous Tissue) Exposed exposed. There is no tunneling or undermining noted. There is a medium amount of serosanguineous drainage noted. The wound margin is well defined and not attached to the wound base. There is large (67-100%) red, pink granulation within the wound bed. There is no necrotic tissue within the wound bed. Assessment Active Problems ICD-10 Unspecified open wound, left knee, initial encounter Non-pressure chronic ulcer of other part of left lower leg with fat layer exposed Presence of left artificial knee joint Essential (primary) hypertension Paroxysmal atrial fibrillation Chronic combined systolic (congestive) and diastolic (congestive) heart failure Acquired absence of right leg above knee Plan Follow-up Appointments: Return Appointment in 2 weeks. Dressing Change Frequency: Wound #1 Left Knee: Change Dressing every other day. Wound Cleansing: Wound #1 Left Knee: May shower and wash wound with soap and water. Primary Wound Dressing: Wound #1 Left Knee: Hydrofera Blue - classic - if available Secondary Dressing: Wound #1 Left Knee: Foam Border - or large bordered gauze. Edema Control: Elevate legs to the level of the heart or above for 30 minutes daily and/or when sitting, a frequency of: - throughout the day. 1. We will continue with Hydrofera Blue with border foam 2. We seem to make making good progress without evidence of infection. 3. Were in the middle of scar tissue here from previous skin grafting which hampers healing Electronic Signature(s) Signed: 05/08/2019 6:41:12 PM By: Linton Ham MD Entered  By: Linton Ham on 05/08/2019 14:00:27 -------------------------------------------------------------------------------- SuperBill Details Patient Name: Date of  Service: Kelsey Joseph, Kelsey Joseph 05/08/2019 Medical Record [id number] Patient Account Number: [id number] Date of Birth/Sex: Treating RN: 1927-08-31 (86 y.o. F) Primary Care Provider: Alden Server Other Clinician: Referring Provider: Treating Provider/Extender:Octavio Matheney, Towana Badger, Rutherford Limerick in Treatment: 22 Diagnosis Coding ICD-10 Codes Code Description S81.002A Unspecified open wound, left knee, initial encounter L97.822 Non-pressure chronic ulcer of other part of left lower leg with fat layer exposed Z96.652 Presence of left artificial knee joint I10 Essential (primary) hypertension I48.0 Paroxysmal atrial fibrillation I50.42 Chronic combined systolic (congestive) and diastolic (congestive) heart failure Z89.611 Acquired absence of right leg above knee Facility Procedures The patient participates with Medicare or their insurance follows the Medicare Facility Guidelines: CPT4 Code Description Modifier Quantity [id number] [place] VISIT-LEV 3 EST PT 1 Physician Procedures CPT4 Code Description: [id number] - WC PHYS LEVEL 3 - EST PT ICD-10 Diagnosis Description L97.822 Non-pressure chronic ulcer of other part of left lower leg S81.002A Unspecified open wound, left knee, initial encounter Modifier: with fat laye Quantity: 1 r exposed Electronic Signature(s) Signed: 05/08/2019 6:39:44 PM By: Levan Hurst RN, BSN Signed: 05/08/2019 6:41:12 PM By: Linton Ham MD Entered By: Levan Hurst on 05/08/2019 18:20:32
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
## 2019-05-11 ENCOUNTER — Ambulatory Visit (HOSPITAL_COMMUNITY)
Admission: RE | Admit: 2019-05-11 | Discharge: 2019-05-11 | Disposition: A | Discharge status: Home / Self Care | Payer: Medicare Other | Source: Ambulatory Visit | Attending: Surgery | Admitting: Surgery

## 2019-05-11 ENCOUNTER — Ambulatory Visit (INDEPENDENT_AMBULATORY_CARE_PROVIDER_SITE_OTHER)
Admission: RE | Admit: 2019-05-11 | Discharge: 2019-05-11 | Disposition: A | Discharge status: Home / Self Care | Payer: Medicare Other | Source: Ambulatory Visit | Attending: Surgery | Admitting: Surgery

## 2019-05-11 ENCOUNTER — Encounter: Payer: Self-pay | Admitting: Vascular Surgery

## 2019-05-11 ENCOUNTER — Other Ambulatory Visit: Payer: Self-pay

## 2019-05-11 ENCOUNTER — Ambulatory Visit (INDEPENDENT_AMBULATORY_CARE_PROVIDER_SITE_OTHER): Payer: PRIVATE HEALTH INSURANCE | Admitting: Vascular Surgery

## 2019-05-11 VITALS — BP 118/54 | HR 61 | Temp 98.0°F | Resp 20 | Ht 63.0 in | Wt 130.0 lb

## 2019-05-11 DIAGNOSIS — I739 Peripheral vascular disease, unspecified: Secondary | ICD-10-CM
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
## 2019-05-12 ENCOUNTER — Other Ambulatory Visit: Payer: Self-pay | Admitting: *Deleted

## 2019-05-12 DIAGNOSIS — I739 Peripheral vascular disease, unspecified: Secondary | ICD-10-CM

## 2019-05-22 ENCOUNTER — Encounter (HOSPITAL_BASED_OUTPATIENT_CLINIC_OR_DEPARTMENT_OTHER): Payer: Medicare Other | Attending: Internal Medicine | Admitting: Internal Medicine

## 2019-05-22 ENCOUNTER — Other Ambulatory Visit: Payer: Self-pay

## 2019-05-22 DIAGNOSIS — L97822 Non-pressure chronic ulcer of other part of left lower leg with fat layer exposed: Secondary | ICD-10-CM | POA: Diagnosis not present

## 2019-05-22 NOTE — Progress Notes (Signed)
Kelsey Joseph, Kelsey Joseph ([id number]) Visit Report for 05/22/2019 Debridement Details Patient Name: Date of Service: Kelsey Joseph, Kelsey Joseph 05/22/2019 12:45 PM Medical Record [id number] Patient Account Number: [id number] Date of Birth/Sex: Treating RN: 06-29-1927 (86 y.o. [name] Primary Care Provider: Alden Server Other Clinician: Referring Provider: Treating Provider/Extender:Victoriano Campion, Towana Badger, Rutherford Limerick in Treatment: 24 Debridement Performed for Wound #1 Left Knee Assessment: Performed By: Physician Ricard Dillon., MD Debridement Type: Debridement Severity of Tissue Pre Fat layer exposed Debridement: Level of Consciousness (Pre- Awake and Alert procedure): Pre-procedure Verification/Time Out Taken: Yes - 13:52 Start Time: 13:52 Total Area Debrided (L x W): 0.3 (cm) x 0.2 (cm) = 0.06 (cm) Tissue and other material Viable, Non-Viable, Skin: Epidermis debrided: Level: Skin/Epidermis Debridement Description: Selective/Open Wound Instrument: Curette Bleeding: Minimum Hemostasis Achieved: Pressure End Time: 13:53 Procedural Pain: 0 Post Procedural Pain: 0 Response to Treatment: Procedure was tolerated well Level of Consciousness Awake and Alert (Post-procedure): Post Debridement Measurements of Total Wound Length: (cm) 0.3 Width: (cm) 0.2 Depth: (cm) 0.1 Volume: (cm) 0.005 Character of Wound/Ulcer Post Improved Debridement: Severity of Tissue Post Debridement: Fat layer exposed Post Procedure Diagnosis Same as Pre-procedure Electronic Signature(s) Signed: 05/22/2019 6:14:31 PM By: Levan Hurst RN, BSN Signed: 05/22/2019 6:16:05 PM By: Linton Ham MD Entered By: Linton Ham on 05/22/2019 14:11:52 -------------------------------------------------------------------------------- HPI Details Patient Name: Date of Service: Kelsey Joseph 05/22/2019 12:45 PM Medical Record [id number] Patient Account Number: [id number] Date of  Birth/Sex: Treating RN: [birth year] (86 y.o. [name] Primary Care Provider: Alden Server Other Clinician: Referring Provider: Treating Provider/Extender:Daley Mooradian, Towana Badger, Rutherford Limerick in Treatment: 24 History of Present Illness HPI Description: 11/30/2018 patient presents today for initial evaluation in our clinic concerning issues that she is having with her left knee. She states that she does not really know of any specific injury that occurred and cause this ulcer. Nonetheless she does have a scratch on her leg from her dog but again she is not aware of her dog actually scratching her at this location. She does have a history of multiple knee replacements with the last being in 2006 I believe this was her third. She also has obviously a artificial knee joint on the left. She has hypertension, paroxysmal atrial fibrillation, congestive heart failure, and a right above-knee amputation. This was secondary to osteomyelitis. At this time patient is having no pain with regard to her knee at this point. Fortunately she is also having no signs of active infection at this time as far as I can Kelsey Joseph. 12/07/2018 upon evaluation today patient actually appears to be showing some signs of improvement which is good news. She still though having a little bit of granulation around the edge of the wound bed has not had as much of a improvement right in the base of the wound as I like to Kelsey Joseph this is still very dry. Nonetheless I believe that we may want to add hydrogel to the collagen in order to allow this to heal more appropriately. She is definitely in agreement with giving this a try. The good news is there does not appear to be any signs of infection. 9/17; patient have not seen previously. 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 She has had skin grafting and also an artificial knee joint  apparently from 2006 on the left. She has a small open area on the left lateral part of her  knee. The exact reason for this is unclear. We have been using silver collagen with some improvement initially but not since the last visit. She is unaware of any skin issues in this area before it open. She thinks she has had this for about 2 months 9/24 absolutely no change in the condition of this wound. Almost looks like a small crater although the patient was not aware of a skin condition in this area. We have been using silver collagen absolutely no change 10/2; pathology of the nodule that did open last time suggested damage scar tissue. Also suggested the possibility of a follicle or cyst. There was no malignancy. We have been using silver collagen 10/9; wound surface is slightly smaller but no suggestion of viable tissue. Still requiring debridement we are using Iodoflex 10/23; 2-week follow-up. Her wound is somewhat better in terms of the surface although quite a bit bigger. Because of the rapid expansion and surface area I have done a shave biopsy of this now large wound. This started as a small cavitating nodule. We have been using Iodoflex I changed her to [facility] today 11/6; biopsy I did last week was negative for malignancy. PAS stain was negative. We are using Hydrofera Blue. This was felt to be dermal scar 11/30. Patient is using Hydrofera Blue to this area in the middle of scar tissue on the left upper leg just below her knee. Wound slightly smaller. I have also been using silver nitrate on hyper granulation 12/14; she is using Hydrofera Blue. The wound is slightly smaller surface certainly looks healthier. She does have however hyper granulation which I continue to use silver nitrate to allow continued epithelialization 1/4. Using Hydrofera Blue. Measuring slightly smaller in terms of surface area 1/18; still using Hydrofera Blue smaller in terms of surface area. 2/1; using  Hydrofera Blue. Surface area improvement 2/15; using Hydrofera Blue. It was hard to identify that anything was open today. Abnormal wound in the setting of dermal scar tissue from previous surgery Electronic Signature(s) Signed: 05/22/2019 6:16:05 PM By: Linton Ham MD Entered By: Linton Ham on 05/22/2019 14:12:58 -------------------------------------------------------------------------------- Physical Exam Details Patient Name: Date of Service: Kelsey Joseph, Kelsey Joseph 05/22/2019 12:45 PM Medical Record [id number] Patient Account Number: [id number] Date of Birth/Sex: Treating RN: 1927-08-05 (86 y.o. [name] Primary Care Provider: Alden Server Other Clinician: Referring Provider: Treating Provider/Extender:Eder Macek, Towana Badger, Rutherford Limerick in Treatment: 24 Constitutional Sitting or standing Blood Pressure is within target range for patient.. Pulse regular and within target range for patient.Marland Kitchen Respirations regular, non-labored and within target range.. Temperature is normal and within the target range for the patient.Marland Kitchen Appears in no distress. Notes Wound exam; we continue to make good improvement here. Came in with some flaking dry nonviable skin over the area. Using a #3 curette this was removed. She has a very tiny open area remaining which is actually looks quite healthy in terms of the wound bed. Electronic Signature(s) Signed: 05/22/2019 6:16:05 PM By: Linton Ham MD Entered By: Linton Ham on 05/22/2019 14:14:55 -------------------------------------------------------------------------------- Physician Orders Details Patient Name: Date of Service: Kelsey Joseph, Kelsey Joseph 05/22/2019 12:45 PM Medical Record [id number] Patient Account Number: [id number] Date of Birth/Sex: Treating RN: 16-Jun-1927 (86 y.o. [name] Primary Care Provider: Alden Server Other Clinician: Referring Provider: Treating Provider/Extender:Parisha Beaulac, Towana Badger, Rutherford Limerick in Treatment: 24 Verbal / Phone Orders: No Diagnosis Coding ICD-10 Coding Code Description S81.002A Unspecified open wound, left knee, initial encounter L97.822 Non-pressure chronic ulcer of  other part of left lower leg with fat layer exposed Z96.652 Presence of left artificial knee joint I10 Essential (primary) hypertension I48.0 Paroxysmal atrial fibrillation I50.42 Chronic combined systolic (congestive) and diastolic (congestive) heart failure Z89.611 Acquired absence of right leg above knee Follow-up Appointments Return Appointment in 2 weeks. Dressing Change Frequency Wound #1 Left Knee Change Dressing every other day. Wound Cleansing Wound #1 Left Knee May shower and wash wound with soap and water. Primary Wound Dressing Wound #1 Left Knee Hydrofera Blue - classic - if available Secondary Dressing Wound #1 Left Knee Foam Border - or large bordered gauze. Edema Control Elevate legs to the level of the heart or above for 30 minutes daily and/or when sitting, a frequency of: - throughout the day. Electronic Signature(s) Signed: 05/22/2019 6:14:31 PM By: Levan Hurst RN, BSN Signed: 05/22/2019 6:16:05 PM By: Linton Ham MD Entered By: Levan Hurst on 05/22/2019 13:37:12 -------------------------------------------------------------------------------- Problem List Details Patient Name: Date of Service: Kelsey Joseph, Kelsey Joseph 05/22/2019 12:45 PM Medical Record [id number] Patient Account Number: [id number] Date of Birth/Sex: Treating RN: 1927/12/06 (86 y.o. [name] Primary Care Provider: Alden Server Other Clinician: Referring Provider: Treating Provider/Extender:Perrin Eddleman, Towana Badger, Rutherford Limerick in Treatment: 24 Active Problems ICD-10 Evaluated Encounter Code Description Active Date Today Diagnosis S81.002A Unspecified open wound, left knee, initial encounter 11/30/2018 No Yes L97.822 Non-pressure chronic ulcer of other part of left lower  11/30/2018 No Yes leg with fat layer exposed Z96.652 Presence of left artificial knee joint 11/30/2018 No Yes I10 Essential (primary) hypertension 11/30/2018 No Yes I48.0 Paroxysmal atrial fibrillation 11/30/2018 No Yes I50.42 Chronic combined systolic (congestive) and diastolic [id number] No Yes (congestive) heart failure Z89.611 Acquired absence of right leg above knee 11/30/2018 No Yes Inactive Problems Resolved Problems Electronic Signature(s) Signed: 05/22/2019 6:16:05 PM By: Linton Ham MD Entered By: Linton Ham on 05/22/2019 14:11:35 -------------------------------------------------------------------------------- Progress Note Details Patient Name: Date of Service: Kelsey Joseph 05/22/2019 12:45 PM Medical Record [id number] Patient Account Number: [id number] Date of Birth/Sex: Treating RN: 1927/07/07 (86 y.o. [name] Primary Care Provider: Alden Server Other Clinician: Referring Provider: Treating Provider/Extender:Theodoro Koval, Towana Badger, Rutherford Limerick in Treatment: 24 Subjective History of Present Illness (HPI) 11/30/2018 patient presents today for initial evaluation in our clinic concerning issues that she is having with her left knee. She states that she does not really know of any specific injury that occurred and cause this ulcer. Nonetheless she does have a scratch on her leg from her dog but again she is not aware of her dog actually scratching her at this location. She does have a history of multiple knee replacements with the last being in 2006 I believe this was her third. She also has obviously a artificial knee joint on the left. She has hypertension, paroxysmal atrial fibrillation, congestive heart failure, and a right above-knee amputation. This was secondary to osteomyelitis. At this time patient is having no pain with regard to her knee at this point. Fortunately she is also having no signs of active infection at this time as far as I can  Kelsey Joseph. 12/07/2018 upon evaluation today patient actually appears to be showing some signs of improvement which is good news. She still though having a little bit of granulation around the edge of the wound bed has not had as much of a improvement right in the base of the wound as I like to Kelsey Joseph this is still very dry. Nonetheless I believe that we may want to add hydrogel to  the collagen in order to allow this to heal more appropriately. She is definitely in agreement with giving this a try. The good news is there does not appear to be any signs of infection. 9/17; patient have not seen previously. 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 She has had skin grafting and also an artificial knee joint apparently from 2006 on the left. She has a small open area on the left lateral part of her knee. The exact reason for this is unclear. We have been using silver collagen with some improvement initially but not since the last visit. She is unaware of any skin issues in this area before it open. She thinks she has had this for about 2 months 9/24 absolutely no change in the condition of this wound. Almost looks like a small crater although the patient was not aware of a skin condition in this area. We have been using silver collagen absolutely no change 10/2; pathology of the nodule that did open last time suggested damage scar tissue. Also suggested the possibility of a follicle or cyst. There was no malignancy. We have been using silver collagen 10/9; wound surface is slightly smaller but no suggestion of viable tissue. Still requiring debridement we are using Iodoflex 10/23; 2-week follow-up. Her wound is somewhat better in terms of the surface although quite a bit bigger. Because of the rapid expansion and surface area I have done a shave biopsy of this now large wound. This started as a small cavitating  nodule. We have been using Iodoflex I changed her to [facility] today 11/6; biopsy I did last week was negative for malignancy. PAS stain was negative. We are using Hydrofera Blue. This was felt to be dermal scar 11/30. Patient is using Hydrofera Blue to this area in the middle of scar tissue on the left upper leg just below her knee. Wound slightly smaller. I have also been using silver nitrate on hyper granulation 12/14; she is using Hydrofera Blue. The wound is slightly smaller surface certainly looks healthier. She does have however hyper granulation which I continue to use silver nitrate to allow continued epithelialization 1/4. Using Hydrofera Blue. Measuring slightly smaller in terms of surface area 1/18; still using Hydrofera Blue smaller in terms of surface area. 2/1; using Hydrofera Blue. Surface area improvement 2/15; using Hydrofera Blue. It was hard to identify that anything was open today. Abnormal wound in the setting of dermal scar tissue from previous surgery Objective Constitutional Sitting or standing Blood Pressure is within target range for patient.. Pulse regular and within target range for patient.Marland Kitchen Respirations regular, non-labored and within target range.. Temperature is normal and within the target range for the patient.Marland Kitchen Appears in no distress. Vitals Time Taken: 1:30 PM, Height: 63 in, Weight: 122 lbs, BMI: 21.6, Temperature: 98.7 F, Pulse: 83 bpm, Respiratory Rate: 19 breaths/min, Blood Pressure: 113/49 mmHg, Capillary Blood Glucose: 134 mg/dl. General Notes: patient stated last CBG was 134 General Notes: Wound exam; we continue to make good improvement here. Came in with some flaking dry nonviable skin over the area. Using a #3 curette this was removed. She has a very tiny open area remaining which is actually looks quite healthy in terms of the wound bed. Integumentary (Hair, Skin) Wound #1 status is Open. Original cause of wound was Gradually Appeared.  The wound is located on the Left Knee. The wound measures 0.3cm length x 0.2cm  width x 0.1cm depth; 0.047cm^2 area and 0.005cm^3 volume. There is Fat Layer (Subcutaneous Tissue) Exposed exposed. There is no tunneling or undermining noted. There is a medium amount of serosanguineous drainage noted. The wound margin is well defined and not attached to the wound base. There is large (67-100%) red, pink granulation within the wound bed. There is no necrotic tissue within the wound bed. 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 There was a Selective/Open Wound Skin/Epidermis Debridement with a total area of 0.06 sq cm performed by Ricard Dillon., MD. With the following instrument(s): Curette to remove Viable and Non-Viable tissue/material. Material removed includes Skin: Epidermis. No specimens were taken. A time out was conducted at 13:52, prior to the start of the procedure. A Minimum amount of bleeding was controlled with Pressure. The procedure was tolerated well with a pain level of 0 throughout and a pain level of 0 following the procedure. Post Debridement Measurements: 0.3cm length x 0.2cm width x 0.1cm depth; 0.005cm^3 volume. Character of Wound/Ulcer Post Debridement is improved. Severity of Tissue Post Debridement is: Fat layer exposed. Post procedure Diagnosis Wound #1: Same as Pre-Procedure Plan Follow-up Appointments: Return Appointment in 2 weeks. Dressing Change  Frequency: Wound #1 Left Knee: Change Dressing every other day. Wound Cleansing: Wound #1 Left Knee: May shower and wash wound with soap and water. Primary Wound Dressing: Wound #1 Left Knee: Hydrofera Blue - classic - if available Secondary Dressing: Wound #1 Left Knee: Foam Border - or large bordered gauze. Edema Control: Elevate legs to the level of the heart or above for 30 minutes daily and/or when sitting, a frequency of: - throughout the day. 1. Hydrofera Blue with border foam to continue. The patient is changing the dressing herself. 2. This is done very well and is progressing towards closure Electronic Signature(s) Signed: 05/22/2019 6:16:05 PM By: Linton Ham MD Entered By: Linton Ham on 05/22/2019 14:15:36 -------------------------------------------------------------------------------- SuperBill Details Patient Name: Date of Service: Kelsey Joseph, Kelsey E. 05/22/2019 Medical Record [id number] Patient Account Number: [id number] Date of Birth/Sex: Treating RN: 11-Dec-1927 (86 y.o. [name] Primary Care Provider: Alden Server Other Clinician: Referring Provider: Treating Provider/Extender:Clancey Welton, Towana Badger, Rutherford Limerick in Treatment: 24 Diagnosis Coding ICD-10 Codes Code Description S81.002A Unspecified open wound, left knee, initial encounter L97.822 Non-pressure chronic ulcer of other part of left lower leg with fat layer exposed Z96.652 Presence of left artificial knee joint I10 Essential (primary) hypertension I48.0 Paroxysmal atrial fibrillation I50.42 Chronic combined systolic (congestive) and diastolic (congestive) heart failure Z89.611 Acquired absence of right leg above knee Facility Procedures The patient participates with Medicare or their insurance follows the Medicare Facility Guidelines: CPT4 Code Description Modifier Quantity [id number] - DEBRIDE WOUND 1ST 20 SQ CM OR < 1 ICD-10 Diagnosis Description L97.822 Non-pressure  chronic ulcer  of other part of left lower leg with fat layer exposed Physician Procedures CPT4 Code Description: [id number] - WC PHYS DEBR WO ANESTH 20 SQ CM ICD-10 Diagnosis Description L97.822 Non-pressure chronic ulcer of other part of left lower leg wi Modifier: th fat laye Quantity: 1  r exposed Electronic Signature(s) Signed: 05/22/2019 6:16:05 PM By: Linton Ham MD Entered By: Linton Ham on 05/22/2019 14:15:56

## 2019-05-22 NOTE — Progress Notes (Addendum)
Kelsey Joseph, Kelsey Joseph ([id number]) Visit Report for 05/22/2019 Arrival Information Details Patient Name: Date of Service: Kelsey Joseph, Kelsey Joseph 05/22/2019 12:45 PM Medical Record [id number] Patient Account Number: [id number] Date of Birth/Sex: Treating RN: 1928-02-01 (86 y.o. [name] Primary Care Nevaya Nagele: Alden Server Other Clinician: Referring Johnross Nabozny: Treating Lamiya Naas/Extender:Robson, Towana Badger, Rutherford [place] in Treatment: 24 Visit Information History Since Last Visit Added or deleted any medications: No Patient Arrived: Wheel Chair Any new allergies or adverse reactions: No Arrival Time: 13:34 Had a fall or experienced change in No activities of daily living that may affect Accompanied By: self risk of falls: Transfer Assistance: None Signs or symptoms of abuse/neglect since last No Patient Identification Verified: Yes visito Secondary Verification Process Completed: Yes Hospitalized since last visit: No Patient Requires Transmission-Based No Implantable device outside of the clinic excluding No Precautions: cellular tissue based products placed in the center Patient Has Alerts: No since last visit: Has Dressing in Place as Prescribed: Yes Pain Present Now: No Electronic Signature(s) Signed: 05/22/2019 6:12:41 PM By: Kela Millin Entered By: Kela Millin on 05/22/2019 13:34:57 -------------------------------------------------------------------------------- Lower Extremity Assessment Details Patient Name: Date of Service: Kelsey Joseph, Kelsey Joseph 05/22/2019 12:45 PM Medical Record [id number] Patient Account Number: [id number] Date of Birth/Sex: Treating RN: 1927/09/04 (86 y.o. [name] Primary Care Kreston Ahrendt: Alden Server Other Clinician: Referring Artice Bergerson: Treating Enos Muhl/Extender:Robson, Towana Badger, Rutherford [place] in Treatment: 24 Electronic Signature(s) Signed: 05/22/2019 6:12:41 PM By: Kela Millin Entered By:  Kela Millin on 05/22/2019 13:36:08 -------------------------------------------------------------------------------- Multi Wound Chart Details Patient Name: Date of Service: Kelsey Joseph, Kelsey Joseph 05/22/2019 12:45 PM Medical Record [id number] Patient Account Number: [id number] Date of Birth/Sex: Treating RN: 15-Nov-1927 (86 y.o. [name] Primary Care Nessie Nong: Alden Server Other Clinician: Referring Destony Prevost: Treating Enrique Manganaro/Extender:Robson, Towana Badger, Rutherford [place] in Treatment: 24 Vital Signs Height(in): 63 Capillary Blood 134 Glucose(mg/dl): Weight(lbs): 122 Pulse(bpm): 83 Body Mass Index(BMI): 22 Blood Pressure(mmHg): 113/49 Temperature(F): 98.7 Respiratory 19 Rate(breaths/min): Photos: [1:No Photos] [N/A:N/A] Wound Location: [1:Left Knee] [N/A:N/A] Wounding Event: [1:Gradually Appeared] [N/A:N/A] Primary Etiology: [1:Trauma, Other] [N/A:N/A] Secondary Etiology: [1:Diabetic Wound/Ulcer of the N/A Lower Extremity] Comorbid History: [1:Sleep Apnea, Congestive N/A Heart Failure, Coronary Artery Disease, Type II Diabetes, Osteomyelitis, Neuropathy] Date Acquired: [1:09/09/2018] [N/A:N/A] Weeks of Treatment: [1:24] [N/A:N/A] Wound Status: [1:Open] [N/A:N/A] Measurements L x W x D 0.3x0.2x0.1 [N/A:N/A] (cm) Area (cm) : [1:0.047] [N/A:N/A] Volume (cm) : [1:0.005] [N/A:N/A] % Reduction in Area: [1:82.90%] [N/A:N/A] % Reduction in Volume: 90.90% [N/A:N/A] Classification: [1:Full Thickness Without Exposed Support Structures] [N/A:N/A] Exudate Amount: [1:Medium] [N/A:N/A] Exudate Type: [1:Serosanguineous] [N/A:N/A] Exudate Color: [1:red, brown] [N/A:N/A] Wound Margin: [1:Well defined, not attached N/A] Granulation Amount: [1:Large (67-100%)] [N/A:N/A] Granulation Quality: [1:Red, Pink] [N/A:N/A] Necrotic Amount: [1:None Present (0%)] [N/A:N/A] Exposed Structures: [1:Fat Layer (Subcutaneous N/A Tissue) Exposed: Yes Fascia: No Tendon: No Muscle: No Joint:  No Bone: No] Epithelialization: [1:Large (67-100%)] [N/A:N/A] Debridement: [1:Debridement - Selective/Open Wound] [N/A:N/A] Pre-procedure [1:13:52] [N/A:N/A] Verification/Time Out Taken: Level: [1:Skin/Epidermis] [N/A:N/A] Debridement Area (sq cm):0.06 [N/A:N/A] Instrument: [1:Curette] [N/A:N/A] Bleeding: [1:Minimum] [N/A:N/A] Hemostasis Achieved: [1:Pressure] [N/A:N/A] Procedural Pain: [1:0] [N/A:N/A] Post Procedural Pain: [1:0] [N/A:N/A] Debridement Treatment Procedure was tolerated [N/A:N/A] Response: [1:well] Post Debridement [1:0.3x0.2x0.1] [N/A:N/A] Measurements L x W x D (cm) Post Debridement [1:0.005] [N/A:N/A] Volume: (cm) Procedures Performed: Debridement [N/A:N/A] Treatment Notes Electronic Signature(s) Signed: 05/22/2019 6:14:31 PM By: Levan Hurst RN, BSN Signed: 05/22/2019 6:16:05 PM By: Linton Ham MD Entered By: Linton Ham on 05/22/2019 14:11:43 -------------------------------------------------------------------------------- Multi-Disciplinary Care Plan Details Patient Name: Date of Service: Kelsey Joseph,  Kelsey E. 05/22/2019 12:45 PM Medical Record [id number] Patient Account Number: [id number] Date of Birth/Sex: Treating RN: 1927-10-04 (86 y.o. [name] Primary Care Aniyha Tate: Alden Server Other Clinician: Referring Birt Reinoso: Treating Gredmarie Delange/Extender:Robson, Towana Badger, Rutherford [place] in Treatment: 24 Active Inactive Wound/Skin Impairment Nursing Diagnoses: Impaired tissue integrity Knowledge deficit related to ulceration/compromised skin integrity Goals: Patient/caregiver will verbalize understanding of skin care regimen Date Initiated: 11/30/2018 Target Resolution Date: 06/09/2019 Goal Status: Active Ulcer/skin breakdown will have a volume reduction of 30% by week 4 Date Initiated: 11/30/2018 Date Inactivated: 01/06/2019 Target Resolution Date: 12/29/2018 Unmet Reason: debrided Goal Status: Unmet undermining Ulcer/skin  breakdown will have a volume reduction of 50% by week 8 Target Resolution Date Initiated: 01/06/2019 Date Inactivated: 01/27/2019 Date: 01/27/2019 Unmet Reason: multiple Goal Status: Unmet debridements, atypical wound Interventions: Assess patient/caregiver ability to obtain necessary supplies Assess patient/caregiver ability to perform ulcer/skin care regimen upon admission and as needed Assess ulceration(s) every visit Provide education on ulcer and skin care Treatment Activities: Skin care regimen initiated : 11/30/2018 Topical wound management initiated : 11/30/2018 Notes: Electronic Signature(s) Signed: 05/22/2019 6:14:31 PM By: Levan Hurst RN, BSN Entered By: Levan Hurst on 05/22/2019 13:37:23 -------------------------------------------------------------------------------- Pain Assessment Details Patient Name: Date of Service: Kelsey Joseph, Kelsey Joseph 05/22/2019 12:45 PM Medical Record [id number] Patient Account Number: [id number] Date of Birth/Sex: Treating RN: [birth year] (86 y.o. [name] Primary Care Nikkol Pai: Alden Server Other Clinician: Referring Jasiel Belisle: Treating Tira Lafferty/Extender:Robson, Towana Badger, Rutherford [place] in Treatment: 24 Active Problems Location of Pain Severity and Description of Pain Patient Has Paino No Site Locations Pain Management and Medication Current Pain Management: Electronic Signature(s) Signed: 05/22/2019 6:12:41 PM By: Kela Millin Entered By: Kela Millin on 05/22/2019 13:35:50 -------------------------------------------------------------------------------- Patient/Caregiver Education Details Patient Name: Date of Service: Kelsey Joseph 2/15/2021andnbsp12:45 PM Medical Record [id number] Patient Account Number: [id number] Date of Birth/Gender: Treating RN: June 04, 1927 (86 y.o. [name] Primary Care Physician: Alden Server Other Clinician: Referring Physician: Treating  Physician/Extender:Robson, Towana Badger, Rutherford [place] in Treatment: 24 Education Assessment Education Provided To: Patient Education Topics Provided Wound/Skin Impairment: Methods: Explain/Verbal Responses: State content correctly Electronic Signature(s) Signed: 05/22/2019 6:14:31 PM By: Levan Hurst RN, BSN Entered By: Levan Hurst on 05/22/2019 13:37:48 -------------------------------------------------------------------------------- Wound Assessment Details Patient Name: Date of Service: Kelsey Joseph, Kelsey Joseph 05/22/2019 12:45 PM Medical Record [id number] Patient Account Number: [id number] Date of Birth/Sex: Treating RN: 07-03-1927 (86 y.o. [name] Primary Care Dayjah Selman: Alden Server Other Clinician: Referring Fawna Cranmer: Treating Taavi Hoose/Extender:Robson, Towana Badger, Rutherford [place] in Treatment: 24 Wound Status Wound Number: 1 Primary Trauma, Other Etiology: Wound Location: Left Knee Secondary Diabetic Wound/Ulcer of the Lower Extremity Wounding Event: Gradually Appeared Etiology: Date Acquired: 09/09/2018 Wound Open Weeks Of Treatment: 24 Status: Clustered Wound: No Comorbid Sleep Apnea, Congestive Heart Failure, History: Coronary Artery Disease, Type II Diabetes, Osteomyelitis, Neuropathy Photos Wound Measurements Length: (cm) 0.3 % Reduc Width: (cm) 0.2 % Reduc Depth: (cm) 0.1 Epithel Area: (cm) 0.047 Tunnel Volume: (cm) 0.005 Underm Wound Description Full Thickness Without Exposed Support Foul O Classification: Structures Slough Wound Well defined, not attached Margin: Exudate Medium Amount: Exudate Serosanguineous Type: Exudate red, brown Color: Wound Bed Granulation Amount: Large (67-100%) Granulation Quality: Red, Pink Fascia Necrotic Amount: None Present (0%) Fat Lay Tendon Muscle Exp Joint Expo Bone Expos dor After Cleansing: No /Fibrino No Exposed Structure Exposed: No er (Subcutaneous Tissue) Exposed:  Yes Exposed: No osed: No sed: No ed: No tion in Area: 82.9% tion in Volume: 90.9%  ialization: Large (67-100%) ing: No ining: No Electronic Signature(s) Signed: 05/23/2019 4:25:10 PM By: Mikeal Hawthorne EMT/HBOT Signed: 06/01/2019 8:56:47 AM By: Levan Hurst RN, BSN Previous Signature: 05/22/2019 6:12:41 PM Version By: Kela Millin Entered By: Mikeal Hawthorne on 05/23/2019 15:27:50 -------------------------------------------------------------------------------- Vitals Details Patient Name: Date of Service: Kelsey Joseph, Kelsey Joseph 05/22/2019 12:45 PM Medical Record [id number] Patient Account Number: [id number] Date of Birth/Sex: Treating RN: Aug 08, 1927 (86 y.o. [name] Primary Care Carlo Guevarra: Alden Server Other Clinician: Referring [place] Dalporto: Treating Keela Rubert/Extender:Robson, Towana Badger, Rutherford [place] in Treatment: 24 Vital Signs Time Taken: 13:30 Temperature (F): 98.7 Height (in): 63 Pulse (bpm): 83 Weight (lbs): 122 Respiratory Rate (breaths/min): 19 Body Mass Index (BMI): 21.6 Blood Pressure (mmHg): 113/49 Capillary Blood Glucose (mg/dl): 134 Reference Range: 80 - 120 mg / dl Notes patient stated last CBG was 134 Electronic Signature(s) Signed: 05/22/2019 6:12:41 PM By: Kela Millin Entered By: Kela Millin on 05/22/2019 13:35:37

## 2019-05-25 ENCOUNTER — Ambulatory Visit (INDEPENDENT_AMBULATORY_CARE_PROVIDER_SITE_OTHER): Payer: Medicare Other | Admitting: *Deleted

## 2019-05-25 DIAGNOSIS — I495 Sick sinus syndrome: Secondary | ICD-10-CM | POA: Diagnosis not present

## 2019-05-26 ENCOUNTER — Other Ambulatory Visit: Payer: Self-pay

## 2019-05-26 LAB — CUP PACEART REMOTE DEVICE CHECK
Battery Remaining Longevity: 58 mo
Battery Voltage: 2.99 V
Brady Statistic AP VP Percent: 2.79 %
Brady Statistic AP VS Percent: 96.19 %
Brady Statistic AS VP Percent: 0.01 %
Brady Statistic AS VS Percent: 1.01 %
Brady Statistic RA Percent Paced: 98.83 %
Brady Statistic RV Percent Paced: 2.97 %
Date Time Interrogation Session: 20210219133539
Implantable Lead Implant Date: 20151215
Implantable Lead Implant Date: 20151215
Implantable Lead Location: 753859
Implantable Lead Location: 753860
Implantable Lead Model: 5076
Implantable Lead Model: 5076
Implantable Pulse Generator Implant Date: 20151215
Lead Channel Impedance Value: 399 Ohm
Lead Channel Impedance Value: 437 Ohm
Lead Channel Impedance Value: 456 Ohm
Lead Channel Impedance Value: 532 Ohm
Lead Channel Pacing Threshold Amplitude: 0.875 V
Lead Channel Pacing Threshold Amplitude: 1 V
Lead Channel Pacing Threshold Pulse Width: 0.4 ms
Lead Channel Pacing Threshold Pulse Width: 0.4 ms
Lead Channel Sensing Intrinsic Amplitude: 1.5 mV
Lead Channel Sensing Intrinsic Amplitude: 1.5 mV
Lead Channel Sensing Intrinsic Amplitude: 18.25 mV
Lead Channel Sensing Intrinsic Amplitude: 18.25 mV
Lead Channel Setting Pacing Amplitude: 1.75 V
Lead Channel Setting Pacing Amplitude: 2 V
Lead Channel Setting Pacing Pulse Width: 0.4 ms
Lead Channel Setting Sensing Sensitivity: 2 mV

## 2019-05-26 MED ORDER — VERAPAMIL HCL ER 240 MG PO TBCR: EXTENDED_RELEASE_TABLET | ORAL | 3 refills | Status: DC

## 2019-06-05 ENCOUNTER — Encounter (HOSPITAL_BASED_OUTPATIENT_CLINIC_OR_DEPARTMENT_OTHER): Payer: Medicare Other | Attending: Internal Medicine | Admitting: Internal Medicine

## 2019-06-05 ENCOUNTER — Other Ambulatory Visit: Payer: Self-pay

## 2019-06-05 DIAGNOSIS — I480 Paroxysmal atrial fibrillation: Secondary | ICD-10-CM | POA: Diagnosis not present

## 2019-06-05 DIAGNOSIS — X58XXXA Exposure to other specified factors, initial encounter: Secondary | ICD-10-CM | POA: Diagnosis not present

## 2019-06-05 DIAGNOSIS — S81002A Unspecified open wound, left knee, initial encounter: Secondary | ICD-10-CM | POA: Diagnosis present

## 2019-06-05 DIAGNOSIS — Z09 Encounter for follow-up examination after completed treatment for conditions other than malignant neoplasm: Secondary | ICD-10-CM | POA: Diagnosis not present

## 2019-06-05 DIAGNOSIS — I110 Hypertensive heart disease with heart failure: Secondary | ICD-10-CM | POA: Diagnosis not present

## 2019-06-05 DIAGNOSIS — L97822 Non-pressure chronic ulcer of other part of left lower leg with fat layer exposed: Secondary | ICD-10-CM | POA: Diagnosis not present

## 2019-06-05 DIAGNOSIS — Z96652 Presence of left artificial knee joint: Secondary | ICD-10-CM | POA: Diagnosis not present

## 2019-06-05 DIAGNOSIS — I5042 Chronic combined systolic (congestive) and diastolic (congestive) heart failure: Secondary | ICD-10-CM | POA: Diagnosis not present

## 2019-06-05 DIAGNOSIS — Z89611 Acquired absence of right leg above knee: Secondary | ICD-10-CM | POA: Diagnosis not present

## 2019-06-05 NOTE — Progress Notes (Addendum)
Kelsey Joseph, Kelsey Joseph ([id number]) Visit Report for 06/05/2019 Arrival Information Details Patient Name: Date of Service: Kelsey Joseph, Kelsey Joseph 06/05/2019 12:45 PM Medical Record [id number] Patient Account Number: [id number] Date of Birth/Sex: Treating RN: Mar 29, 1928 (86 y.o. [name] Primary Care Kimberla Driskill: Alden Server Other Clinician: Referring Analee Montee: Treating Xue Low/Extender:Robson, Towana Badger, Rutherford L[place] in Treatment: 26 Visit Information History Since Last Visit Added or deleted any medications: No Patient Arrived: Wheel Chair Any new allergies or adverse reactions: No Arrival Time: 13:03 Had a fall or experienced change in No activities of daily living that may affect Accompanied By: self risk of falls: Transfer Assistance: None Signs or symptoms of abuse/neglect since last No Patient Identification Verified: Yes visito Secondary Verification Process Completed: Yes Hospitalized since last visit: No Patient Requires Transmission-Based No Implantable device outside of the clinic excluding No Precautions: cellular tissue based products placed in the center Patient Has Alerts: No since last visit: Has Dressing in Place as Prescribed: Yes Pain Present Now: No Electronic Signature(s) Signed: 06/05/2019 5:21:05 PM By: Kela Millin Entered By: Kela Millin on 06/05/2019 13:03:49 -------------------------------------------------------------------------------- Clinic Level of Care Assessment Details Patient Name: Date of Service: Kelsey Joseph 06/05/2019 12:45 PM Medical Record [id number] Patient Account Number: [id number] Date of Birth/Sex: Treating RN: [birth year] (86 y.o. [name] Primary Care Shadi Larner: Alden Server Other Clinician: Referring Matilyn Fehrman: Treating Ieasha Boerema/Extender:Robson, Towana Badger, Rutherford Limerick in Treatment: 26 Clinic Level of Care Assessment Items TOOL 4 Quantity Score X - Use when only an EandM is  performed on FOLLOW-UP visit 1 0 ASSESSMENTS - Nursing Assessment / Reassessment X - Reassessment of Co-morbidities (includes updates in patient status) 1 10 X - Reassessment of Adherence to Treatment Plan 1 5 ASSESSMENTS - Wound and Skin Assessment / Reassessment X - Simple Wound Assessment / Reassessment - one wound 1 5 []  - Complex Wound Assessment / Reassessment - multiple wounds 0 []  - Dermatologic / Skin Assessment (not related to wound area) 0 ASSESSMENTS - Focused Assessment []  - Circumferential Edema Measurements - multi extremities 0 []  - Nutritional Assessment / Counseling / Intervention 0 []  - Lower Extremity Assessment (monofilament, tuning fork, pulses) 0 []  - Peripheral Arterial Disease Assessment (using hand held doppler) 0 ASSESSMENTS - Ostomy and/or Continence Assessment and Care []  - Incontinence Assessment and Management 0 []  - Ostomy Care Assessment and Management (repouching, etc.) 0 PROCESS - Coordination of Care X - Simple Patient / Family Education for ongoing care 1 15 []  - Complex (extensive) Patient / Family Education for ongoing care 0 X - Staff obtains Programmer, systems, Records, Test Results / Process Orders 1 10 []  - Staff telephones HHA, Nursing Homes / Clarify orders / etc 0 []  - Routine Transfer to another Facility (non-emergent condition) 0 []  - Routine Hospital Admission (non-emergent condition) 0 []  - New Admissions / Biomedical engineer / Ordering NPWT, Apligraf, etc. 0 []  - Emergency Hospital Admission (emergent condition) 0 X - Simple Discharge Coordination 1 10 []  - Complex (extensive) Discharge Coordination 0 PROCESS - Special Needs []  - Pediatric / Minor Patient Management 0 []  - Isolation Patient Management 0 []  - Hearing / Language / Visual special needs 0 []  - Assessment of Community assistance (transportation, D/C planning, etc.) 0 []  - Additional assistance / Altered mentation 0 []  - Support Surface(s) Assessment (bed, cushion, seat, etc.)  0 INTERVENTIONS - Wound Cleansing / Measurement X - Simple Wound Cleansing - one wound 1 5 []  - Complex Wound Cleansing - multiple wounds 0 X -  Wound Imaging (photographs - any number of wounds) 1 5 []  - Wound Tracing (instead of photographs) 0 X - Simple Wound Measurement - one wound 1 5 []  - Complex Wound Measurement - multiple wounds 0 INTERVENTIONS - Wound Dressings []  - Small Wound Dressing one or multiple wounds 0 []  - Medium Wound Dressing one or multiple wounds 0 []  - Large Wound Dressing one or multiple wounds 0 []  - Application of Medications - topical 0 []  - Application of Medications - injection 0 INTERVENTIONS - Miscellaneous []  - External ear exam 0 []  - Specimen Collection (cultures, biopsies, blood, body fluids, etc.) 0 []  - Specimen(s) / Culture(s) sent or taken to Lab for analysis 0 []  - Patient Transfer (multiple staff / Civil Service fast streamer / Similar devices) 0 []  - Simple Staple / Suture removal (25 or less) 0 []  - Complex Staple / Suture removal (26 or more) 0 []  - Hypo / Hyperglycemic Management (close monitor of Blood Glucose) 0 []  - Ankle / Brachial Index (ABI) - do not check if billed separately 0 X - Vital Signs 1 5 Has the patient been seen at the hospital within the last three years: Yes Total Score: 75 Level Of Care: New/Established - Level 2 Electronic Signature(s) Signed: 06/05/2019 5:57:14 PM By: Levan Hurst RN, BSN Entered By: Levan Hurst on 06/05/2019 17:41:56 -------------------------------------------------------------------------------- Encounter Discharge Information Details Patient Name: Date of Service: Kelsey Joseph 06/05/2019 12:45 PM Medical Record [id number] Patient Account Number: [id number] Date of Birth/Sex: Treating RN: 1928-01-31 (86 y.o. [name] Primary Care Osaze Hubbert: Alden Server Other Clinician: Referring Deroy Noah: Treating Mariangel Ringley/Extender:Robson, Towana Badger, Rutherford Limerick in Treatment: 26 Encounter  Discharge Information Items Discharge Condition: Stable Ambulatory Status: Wheelchair Discharge Destination: Home Transportation: Private Auto Accompanied By: alone Schedule Follow-up Appointment: Yes Clinical Summary of Care: Patient Declined Electronic Signature(s) Signed: 06/05/2019 5:57:14 PM By: Levan Hurst RN, BSN Entered By: Levan Hurst on 06/05/2019 17:42:17 -------------------------------------------------------------------------------- Lower Extremity Assessment Details Patient Name: Date of Service: Kelsey Joseph, Kelsey Joseph 06/05/2019 12:45 PM Medical Record [id number] Patient Account Number: [id number] Date of Birth/Sex: Treating RN: 12-14-1927 (86 y.o. [name] Primary Care Yaasir Menken: Alden Server Other Clinician: Referring Cella Cappello: Treating Lillyann Ahart/Extender:Robson, Towana Badger, Rutherford Limer[place] in Treatment: 26 Electronic Signature(s) Signed: 06/05/2019 5:21:05 PM By: Kela Millin Entered By: Kela Millin on 06/05/2019 13:05:45 -------------------------------------------------------------------------------- Multi Wound Chart Details Patient Name: Date of Service: Kelsey Joseph, Kelsey Joseph 06/05/2019 12:45 PM Medical Record [id number] Patient Account Number: [id number] Date of Birth/Sex: Treating RN: 1927/09/17 (86 y.o. [name] Primary Care Dino Borntreger: Alden Server Other Clinician: Referring Eudora Guevarra: Treating Renold Kozar/Extender:Robson, Towana Badger, Rutherford Limer[place] in Treatment: 26 Vital Signs Height(in): 63 Capillary Blood 136 Glucose(mg/dl): Weight(lbs): 122 Pulse(bpm): 72 Body Mass Index(BMI): 22 Blood Pressure(mmHg): 91/39 Temperature(F): 97.8 Respiratory 19 Rate(breaths/min): Photos: [1:No Photos] [N/A:N/A] Wound Location: [1:Left Knee] [N/A:N/A] Wounding Event: [1:Gradually Appeared] [N/A:N/A] Primary Etiology: [1:Trauma, Other] [N/A:N/A] Secondary Etiology: [1:Diabetic Wound/Ulcer of the N/A Lower Extremity] Comorbid  History: [1:Sleep Apnea, Congestive N/A Heart Failure, Coronary Artery Disease, Type II Diabetes, Osteomyelitis, Neuropathy] Date Acquired: [1:09/09/2018] [N/A:N/A] Weeks of Treatment: [1:26] [N/A:N/A] Wound Status: [1:Healed - Epithelialized] [N/A:N/A] Measurements L x W x D 0x0x0 [N/A:N/A] (cm) Area (cm) : [1:0] [N/A:N/A] Volume (cm) : [1:0] [N/A:N/A] % Reduction in Area: [1:100.00%] [N/A:N/A] % Reduction in Volume: 100.00% [N/A:N/A] Classification: [1:Full Thickness Without Exposed Support Structures] [N/A:N/A] Exudate Amount: [1:None Present] [N/A:N/A] Wound Margin: [1:Distinct, outline attached N/A] Granulation Amount: [1:None Present (0%)] [N/A:N/A] Necrotic Amount: [1:None Present (  0%)] [N/A:N/A] Exposed Structures: [1:Fascia: No Fat Layer (Subcutaneous Tissue) Exposed: No Tendon: No Muscle: No Joint: No Bone: No Large (67-100%)] [N/A:N/A N/A] Treatment Notes Electronic Signature(s) Signed: 06/05/2019 5:45:55 PM By: Linton Ham MD Signed: 06/05/2019 5:57:14 PM By: Levan Hurst RN, BSN Entered By: Linton Ham on 06/05/2019 13:57:24 -------------------------------------------------------------------------------- Multi-Disciplinary Care Plan Details Patient Name: Date of Service: Kelsey Joseph, Kelsey Joseph 06/05/2019 12:45 PM Medical Record [id number] Patient Account Number: [id number] Date of Birth/Sex: Treating RN: 04-25-1927 (86 y.o. [name] Primary Care Yuko Coventry: Alden Server Other Clinician: Referring Tammey Deeg: Treating Able Malloy/Extender:Robson, Towana Badger, Rutherford Limerick in Treatment: 26 Active Inactive Electronic Signature(s) Signed: 06/05/2019 5:57:14 PM By: Levan Hurst RN, BSN Entered By: Levan Hurst on 06/05/2019 17:41:26 -------------------------------------------------------------------------------- Pain Assessment Details Patient Name: Date of Service: Kelsey Joseph, Kelsey Joseph 06/05/2019 12:45 PM Medical Record [id number] Patient Account  Number: [id number] Date of Birth/Sex: Treating RN: [birth year] (86 y.o. [name] Primary Care Camillo Quadros: Alden Server Other Clinician: Referring Kaire Stary: Treating [name] Rottinghaus/Extender:Robson, Towana Badger, Rutherford Limerick in Treatment: 26 Active Problems Location of Pain Severity and Description of Pain Patient Has Paino No Site Locations Pain Management and Medication Current Pain Management: Electronic Signature(s) Signed: 06/05/2019 5:21:05 PM By: Kela Millin Entered By: Kela Millin on 06/05/2019 13:05:36 -------------------------------------------------------------------------------- Patient/Caregiver Education Details Patient Name: Date of Service: Kelsey Joseph 3/1/2021andnbsp12:45 PM Medical Record [id number] Patient Account Number: [id number] Date of Birth/Gender: Treating RN: December 30, 1927 (86 y.o. [name] Primary Care Physician: Alden Server Other Clinician: Referring Physician: Treating Physician/Extender:Robson, Towana Badger, Rutherford Limerick in Treatment: 26 Education Assessment Education Provided To: Patient Education Topics Provided Wound/Skin Impairment: Methods: Explain/Verbal Responses: State content correctly [employer]) Signed: 06/05/2019 5:57:14 PM By: Levan Hurst RN, BSN Entered By: Levan Hurst on 06/05/2019 17:41:36 -------------------------------------------------------------------------------- Wound Assessment Details Patient Name: Date of Service: Kelsey Joseph, Kelsey Joseph 06/05/2019 12:45 PM Medical Record [id number] Patient Account Number: [id number] Date of Birth/Sex: Treating RN: 04-Mar-1928 (86 y.o. [name] Primary Care Chrys Landgrebe: Alden Server Other Clinician: Referring Linville Decarolis: Treating Sadey Yandell/Extender:Robson, Towana Badger, Rutherford Limerick in Treatment: 26 Wound Status Wound Number: 1 Primary Trauma, Other Etiology: Wound Location: Left Knee Secondary Diabetic Wound/Ulcer  of the Lower Extremity Wounding Event: Gradually Appeared Etiology: Date Acquired: 09/09/2018 Wound Healed - Epithelialized Weeks Of Treatment: 26 Status: Clustered Wound: No Comorbid Sleep Apnea, Congestive Heart Failure, History: Coronary Artery Disease, Type II Diabetes, Osteomyelitis, Neuropathy Photos Wound Measurements Length: (cm) 0 % Reduct Width: (cm) 0 % Reduct Depth: (cm) 0 Epitheli Area: (cm) 0 Tunneli Volume: (cm) 0 Undermi Wound Description Classification: Full Thickness Without Exposed Support Structures Wound Distinct, outline attached Margin: Exudate None Present Amount: Wound Bed Granulation Amount: None Present (0%) Necrotic Amount: None Present (0%) Foul Odor After Cleansing: No Slough/Fibrino No Exposed Structure Fascia Exposed: No Fat Layer (Subcutaneous Tissue) Exposed: No Tendon Exposed: No Muscle Exposed: No Joint Exposed: No Bone Exposed: No ion in Area: 100% ion in Volume: 100% alization: Large (67-100%) ng: No ning: No Electronic Signature(s) Signed: 06/07/2019 4:03:04 PM By: Mikeal Hawthorne EMT/HBOT Signed: 06/07/2019 5:54:02 PM By: Levan Hurst RN, BSN Previous Signature: 06/05/2019 5:57:14 PM Version By: Levan Hurst RN, BSN Entered By: Mikeal Hawthorne on 06/07/2019 14:01:01 -------------------------------------------------------------------------------- [place] Details Patient Name: Date of Service: Kelsey Joseph 06/05/2019 12:45 PM Medical Record [id number] Patient Account Number: [id number] Date of Birth/Sex: Treating RN: 1927-07-31 (86 y.o. [name] Primary Care Antron Seth: Alden Server Other Clinician: Referring Shyloh Derosa: Treating Sebastien Jackson/Extender:Robson, Towana Badger, Lynnell Chad  Weeks in Treatment: 26 Vital Signs Time Taken: 13:00 Temperature (F): 97.8 Height (in): 63 Pulse (bpm): 72 Weight (lbs): 122 Respiratory Rate (breaths/min): 19 Body Mass Index (BMI): 21.6 Blood Pressure (mmHg):  91/39 Capillary Blood Glucose (mg/dl): 136 Reference Range: 80 - 120 mg / dl Notes patient stated CBG this morning was 136 Electronic Signature(s) Signed: 06/05/2019 5:21:05 PM By: Kela Millin Entered By: Kela Millin on 06/05/2019 13:05:32

## 2019-06-05 NOTE — Progress Notes (Signed)
Kelsey Joseph, Kelsey Joseph ([id number]) Visit Report for 06/05/2019 HPI Details Patient Name: Date of Service: Kelsey Joseph, Kelsey Joseph 06/05/2019 12:45 PM Medical Record [id number] Patient Account Number: [id number] Date of Birth/Sex: Treating RN: [birth year] (86 y.o. Kelsey Joseph Primary Care Provider: Alden Joseph Other Clinician: Referring Provider: Treating Provider/Extender:Kelsey Joseph, Kelsey Joseph, Kelsey Joseph in Treatment: 26 History of Present Illness HPI Description: 11/30/2018 patient presents today for initial evaluation in our clinic concerning issues that she is having with her left knee. She states that she does not really know of any specific injury that occurred and cause this ulcer. Nonetheless she does have a scratch on her leg from her dog but again she is not aware of her dog actually scratching her at this location. She does have a history of multiple knee replacements with the last being in 2006 I believe this was her third. She also has obviously a artificial knee joint on the left. She has hypertension, paroxysmal atrial fibrillation, congestive heart failure, and a right above-knee amputation. This was secondary to osteomyelitis. At this time patient is having no pain with regard to her knee at this point. Fortunately she is also having no signs of active infection at this time as far as I can see. 12/07/2018 upon evaluation today patient actually appears to be showing some signs of improvement which is good news. She still though having a little bit of granulation around the edge of the wound bed has not had as much of a improvement right in the base of the wound as I like to see this is still very dry. Nonetheless I believe that we may want to add hydrogel to the collagen in order to allow this to heal more appropriately. She is definitely in agreement with giving this a try. The good news is there does not appear to be any signs of infection. 9/17; patient have not seen  previously. 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 She has had skin grafting and also an artificial knee joint apparently from 2006 on the left. She has a small open area on the left lateral part of her knee. The exact reason for this is unclear. We have been using silver collagen with some improvement initially but not since the last visit. She is unaware of any skin issues in this area before it open. She thinks she has had this for about 2 months 9/24 absolutely no change in the condition of this wound. Almost looks like a small crater although the patient was not aware of a skin condition in this area. We have been using silver collagen absolutely no change 10/2; pathology of the nodule that did open last time suggested damage scar tissue. Also suggested the possibility of a follicle or cyst. There was no malignancy. We have been using silver collagen 10/9; wound surface is slightly smaller but no suggestion of viable tissue. Still requiring debridement we are using Iodoflex 10/23; 2-week follow-up. Her wound is somewhat better in terms of the surface although quite a bit bigger. Because of the rapid expansion and surface area I have done a shave biopsy of this now large wound. This started as a small cavitating nodule. We have been using Iodoflex I changed her to [facility] today 11/6; biopsy I did last week was negative for malignancy. PAS stain was negative. We are using Hydrofera Blue. This was felt to be dermal scar 11/30. Patient  is using Hydrofera Blue to this area in the middle of scar tissue on the left upper leg just below her knee. Wound slightly smaller. I have also been using silver nitrate on hyper granulation 12/14; she is using Hydrofera Blue. The wound is slightly smaller surface certainly looks healthier. She does have however hyper granulation which I continue to use  silver nitrate to allow continued epithelialization 1/4. Using Hydrofera Blue. Measuring slightly smaller in terms of surface area 1/18; still using Hydrofera Blue smaller in terms of surface area. 2/1; using Hydrofera Blue. Surface area improvement 2/15; using Hydrofera Blue. It was hard to identify that anything was open today. Abnormal wound in the setting of dermal scar tissue from previous surgery 3/1; 2-week follow-up. The patient is actually closed. Abnormal wound in the setting of dermal scar right over her fibular head in the setting of previous surgery. Came in with a very unusual wound which I debrided and sent to pathology. Negative for malignancy PAS stain was negative. Electronic Signature(s) Signed: 06/05/2019 5:45:55 PM By: Kelsey Ham MD Entered By: Kelsey Joseph on 06/05/2019 13:58:07 -------------------------------------------------------------------------------- Physical Exam Details Patient Name: Date of Service: Kelsey Joseph 06/05/2019 12:45 PM Medical Record [id number] Patient Account Number: [id number] Date of Birth/Sex: Treating RN: 06-18-1927 (86 y.o. Kelsey Joseph Primary Care Provider: Alden Joseph Other Clinician: Referring Provider: Treating Provider/Extender:Hailei Besser, Kelsey Joseph, Kelsey Joseph in Treatment: 26 Constitutional Patient is hypotensive. However appears alert and well. Pulse regular and within target range for patient.Marland Kitchen Respirations regular, non-labored and within target range.. Temperature is normal and within the target range for the patient.Marland Kitchen Appears in no distress. Notes Wound exam; the patient is 100% epithelialized. Vulnerable area over the fibular head. I am still not exactly sure what caused this in the first place. I have told her to protect this area make sure there is no friction here. Electronic Signature(s) Signed: 06/05/2019 5:45:55 PM By: Kelsey Ham MD Entered By: Kelsey Joseph on 06/05/2019  13:58:48 -------------------------------------------------------------------------------- Physician Orders Details Patient Name: Date of Service: Kelsey Joseph, Kelsey Joseph 06/05/2019 12:45 PM Medical Record [id number] Patient Account Number: [id number] Date of Birth/Sex: Treating RN: 20-Dec-1927 (86 y.o. Kelsey Joseph Primary Care Provider: Alden Joseph Other Clinician: Referring Provider: Treating Provider/Extender:Hasset Chaviano, Kelsey Joseph, Kelsey Joseph in Treatment: 26 Verbal / Phone Orders: No Diagnosis Coding ICD-10 Coding Code Description S81.002A Unspecified open wound, left knee, initial encounter L97.822 Non-pressure chronic ulcer of other part of left lower leg with fat layer exposed Z96.652 Presence of left artificial knee joint I10 Essential (primary) hypertension I48.0 Paroxysmal atrial fibrillation I50.42 Chronic combined systolic (congestive) and diastolic (congestive) heart failure Z89.611 Acquired absence of right leg above knee Discharge From [facility] Services Discharge from [place] Primary Wound Dressing Foam - for protection Edema Control Elevate legs to the level of the heart or above for 30 minutes daily and/or when sitting, a frequency of: - throughout the day. Electronic Signature(s) Signed: 06/05/2019 5:45:55 PM By: Kelsey Ham MD Signed: 06/05/2019 5:57:14 PM By: Levan Hurst RN, BSN Entered By: Levan Hurst on 06/05/2019 13:53:52 -------------------------------------------------------------------------------- Problem List Details Patient Name: Date of Service: Kelsey Joseph, Kelsey Joseph 06/05/2019 12:45 PM Medical Record [id number] Patient Account Number: [id number] Date of Birth/Sex: Treating RN: 1927/04/07 (86 y.o. Kelsey Joseph Primary Care Provider: Alden Joseph Other Clinician: Referring Provider: Treating Provider/Extender:Brailyn Delman, Kelsey Joseph, Kelsey Joseph in Treatment: 26 Active Problems ICD-10 Evaluated Encounter Code  Description Active Date Today Diagnosis S81.002A Unspecified open wound, left knee,  initial encounter 11/30/2018 No Yes L97.822 Non-pressure chronic ulcer of other part of left lower 11/30/2018 No Yes leg with fat layer exposed Z96.652 Presence of left artificial knee joint 11/30/2018 No Yes I10 Essential (primary) hypertension 11/30/2018 No Yes I48.0 Paroxysmal atrial fibrillation 11/30/2018 No Yes I50.42 Chronic combined systolic (congestive) and diastolic [id number] No Yes (congestive) heart failure Z89.611 Acquired absence of right leg above knee 11/30/2018 No Yes Inactive Problems Resolved Problems Electronic Signature(s) Signed: 06/05/2019 5:45:55 PM By: Kelsey Ham MD Signed: 06/05/2019 5:57:14 PM By: Levan Hurst RN, BSN Entered By: Levan Hurst on 06/05/2019 14:02:14 -------------------------------------------------------------------------------- Progress Note Details Patient Name: Date of Service: Kelsey Joseph, Kelsey Joseph 06/05/2019 12:45 PM Medical Record [id number] Patient Account Number: [id number] Date of Birth/Sex: Treating RN: 1927/04/09 (86 y.o. Kelsey Joseph Primary Care Provider: Alden Joseph Other Clinician: Referring Provider: Treating Provider/Extender:Chesky Heyer, Kelsey Joseph, Kelsey Joseph in Treatment: 26 Subjective History of Present Illness (HPI) 11/30/2018 patient presents today for initial evaluation in our clinic concerning issues that she is having with her left knee. She states that she does not really know of any specific injury that occurred and cause this ulcer. Nonetheless she does have a scratch on her leg from her dog but again she is not aware of her dog actually scratching her at this location. She does have a history of multiple knee replacements with the last being in 2006 I believe this was her third. She also has obviously a artificial knee joint on the left. She has hypertension, paroxysmal atrial fibrillation, congestive heart failure, and  a right above-knee amputation. This was secondary to osteomyelitis. At this time patient is having no pain with regard to her knee at this point. Fortunately she is also having no signs of active infection at this time as far as I can see. 12/07/2018 upon evaluation today patient actually appears to be showing some signs of improvement which is good news. She still though having a little bit of granulation around the edge of the wound bed has not had as much of a improvement right in the base of the wound as I like to see this is still very dry. Nonetheless I believe that we may want to add hydrogel to the collagen in order to allow this to heal more appropriately. She is definitely in agreement with giving this a try. The good news is there does not appear to be any signs of infection. 9/17; patient have not seen previously. 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 She has had skin grafting and also an artificial knee joint apparently from 2006 on the left. She has a small open area on the left lateral part of her knee. The exact reason for this is unclear. We have been using silver collagen with some improvement initially but not since the last visit. She is unaware of any skin issues in this area before it open. She thinks she has had this for about 2 months 9/24 absolutely no change in the condition of this wound. Almost looks like a small crater although the patient was not aware of a skin condition in this area. We have been using silver collagen absolutely no change 10/2; pathology of the nodule that did open last time suggested damage scar tissue. Also suggested the possibility of a follicle or cyst. There was no malignancy. We have been using silver collagen 10/9; wound surface is slightly smaller but no  suggestion of viable tissue. Still requiring debridement we are using Iodoflex 10/23;  2-week follow-up. Her wound is somewhat better in terms of the surface although quite a bit bigger. Because of the rapid expansion and surface area I have done a shave biopsy of this now large wound. This started as a small cavitating nodule. We have been using Iodoflex I changed her to [facility] today 11/6; biopsy I did last week was negative for malignancy. PAS stain was negative. We are using Hydrofera Blue. This was felt to be dermal scar 11/30. Patient is using Hydrofera Blue to this area in the middle of scar tissue on the left upper leg just below her knee. Wound slightly smaller. I have also been using silver nitrate on hyper granulation 12/14; she is using Hydrofera Blue. The wound is slightly smaller surface certainly looks healthier. She does have however hyper granulation which I continue to use silver nitrate to allow continued epithelialization 1/4. Using Hydrofera Blue. Measuring slightly smaller in terms of surface area 1/18; still using Hydrofera Blue smaller in terms of surface area. 2/1; using Hydrofera Blue. Surface area improvement 2/15; using Hydrofera Blue. It was hard to identify that anything was open today. Abnormal wound in the setting of dermal scar tissue from previous surgery 3/1; 2-week follow-up. The patient is actually closed. Abnormal wound in the setting of dermal scar right over her fibular head in the setting of previous surgery. Came in with a very unusual wound which I debrided and sent to pathology. Negative for malignancy PAS stain was negative. Objective Constitutional Patient is hypotensive. However appears alert and well. Pulse regular and within target range for patient.Marland Kitchen Respirations regular, non-labored and within target range.. Temperature is normal and within the target range for the patient.Marland Kitchen Appears in no distress. Vitals Time Taken: 1:00 PM, Height: 63 in, Weight: 122 lbs, BMI: 21.6, Temperature: 97.8 F, Pulse: 72 bpm, Respiratory  Rate: 19 breaths/min, Blood Pressure: 91/39 mmHg, Capillary Blood Glucose: 136 mg/dl. General Notes: patient stated CBG this morning was 136 General Notes: Wound exam; the patient is 100% epithelialized. Vulnerable area over the fibular head. I am still not exactly sure what caused this in the first place. I have told her to protect this area make sure there is no friction here. Integumentary (Hair, Skin) Wound #1 status is Healed - Epithelialized. Original cause of wound was Gradually Appeared. The wound is located on the Left Knee. The wound measures 0cm length x 0cm width x 0cm depth; 0cm^2 area and 0cm^3 volume. There is no tunneling or undermining noted. There is a none present amount of drainage noted. The wound margin is distinct with the outline attached to the wound base. There is no granulation within the wound bed. There is no necrotic tissue within the wound bed. Assessment Active Problems ICD-10 Unspecified open wound, left knee, initial encounter Non-pressure chronic ulcer of other part of left lower leg with fat layer exposed Presence of left artificial knee joint Essential (primary) hypertension Paroxysmal atrial fibrillation Chronic combined systolic (congestive) and diastolic (congestive) heart failure Acquired absence of right leg above knee Plan Discharge From [facility] Services: Discharge from [place] Primary Wound Dressing: Foam - for protection Edema Control: Elevate legs to the level of the heart or above for 30 minutes daily and/or when sitting, a frequency of: - throughout the day. 1. I would continue to protect this area daily example with a thick Band-Aid or foam 2. Otherwise the patient can be discharged from the  wound care center Electronic Signature(s) Signed: 06/05/2019 5:45:55 PM By: Kelsey Ham MD Entered By: Kelsey Joseph on 06/05/2019 13:59:19 -------------------------------------------------------------------------------- SuperBill  Details Patient Name: Date of Service: Kelsey Joseph, Kelsey Joseph 06/05/2019 Medical Record [id number] Patient Account Number: [id number] Date of Birth/Sex: Treating RN: 1927/04/30 (86 y.o. Kelsey Joseph Primary Care Provider: Alden Joseph Other Clinician: Referring Provider: Treating Provider/Extender:Alyze Lauf, Kelsey Joseph, Kelsey Joseph in Treatment: 26 Diagnosis Coding ICD-10 Codes Code Description S81.002A Unspecified open wound, left knee, initial encounter L97.822 Non-pressure chronic ulcer of other part of left lower leg with fat layer exposed Z96.652 Presence of left artificial knee joint I10 Essential (primary) hypertension I48.0 Paroxysmal atrial fibrillation I50.42 Chronic combined systolic (congestive) and diastolic (congestive) heart failure Z89.611 Acquired absence of right leg above knee Facility Procedures The patient participates with Medicare or their insurance follows the Medicare Facility Guidelines: CPT4 Code Description Modifier Quantity [id number] - WOUND CARE VISIT-LEV 2 EST PT 1 Physician Procedures CPT4 Code Description: [id number] - WC PHYS LEVEL 2 - EST PT ICD-10 Diagnosis Description S81.002A Unspecified open wound, left knee, initial encounter L97.822 Non-pressure chronic ulcer of other part of left lower le Modifier: g with fat laye Quantity: 1 r exposed Electronic Signature(s) Signed: 06/05/2019 5:45:55 PM By: Kelsey Ham MD Signed: 06/05/2019 5:57:14 PM By: Levan Hurst RN, BSN Entered By: Levan Hurst on 06/05/2019 17:42:06

## 2019-06-06 ENCOUNTER — Other Ambulatory Visit: Payer: Self-pay

## 2019-08-08 ENCOUNTER — Ambulatory Visit (HOSPITAL_COMMUNITY): Admission: RE | Admit: 2019-08-08 | Payer: PRIVATE HEALTH INSURANCE | Source: Ambulatory Visit

## 2019-08-08 ENCOUNTER — Ambulatory Visit: Payer: PRIVATE HEALTH INSURANCE

## 2019-08-17 ENCOUNTER — Telehealth: Payer: Self-pay

## 2019-08-17 NOTE — Telephone Encounter (Signed)
Call received from patient c/o Lt leg red from calf down. She denies pain, swelling or SOB. Patient has studies and follow up appt on 09/01/19. Advised patient to elevate leg and may apply her compression stocking. Advised scheduling will contact patient if any earlier appointments. Patient advised to contact if any changes. Patient voiced understanding. Minette Brine, RN

## 2019-08-24 ENCOUNTER — Ambulatory Visit (INDEPENDENT_AMBULATORY_CARE_PROVIDER_SITE_OTHER): Payer: PRIVATE HEALTH INSURANCE | Admitting: *Deleted

## 2019-08-24 DIAGNOSIS — R55 Syncope and collapse: Secondary | ICD-10-CM

## 2019-08-24 LAB — CUP PACEART REMOTE DEVICE CHECK
Battery Remaining Longevity: 51 mo
Battery Voltage: 2.98 V
Brady Statistic AP VP Percent: 3.58 %
Brady Statistic AP VS Percent: 95.6 %
Brady Statistic AS VP Percent: 0.01 %
Brady Statistic AS VS Percent: 0.82 %
Brady Statistic RA Percent Paced: 99.1 %
Brady Statistic RV Percent Paced: 3.81 %
Date Time Interrogation Session: 20210520155213
Implantable Lead Implant Date: 20151215
Implantable Lead Implant Date: 20151215
Implantable Lead Location: 753859
Implantable Lead Location: 753860
Implantable Lead Model: 5076
Implantable Lead Model: 5076
Implantable Pulse Generator Implant Date: 20151215
Lead Channel Impedance Value: 380 Ohm
Lead Channel Impedance Value: 399 Ohm
Lead Channel Impedance Value: 437 Ohm
Lead Channel Impedance Value: 513 Ohm
Lead Channel Pacing Threshold Amplitude: 0.875 V
Lead Channel Pacing Threshold Amplitude: 1 V
Lead Channel Pacing Threshold Pulse Width: 0.4 ms
Lead Channel Pacing Threshold Pulse Width: 0.4 ms
Lead Channel Sensing Intrinsic Amplitude: 17.875 mV
Lead Channel Sensing Intrinsic Amplitude: 17.875 mV
Lead Channel Sensing Intrinsic Amplitude: 2.375 mV
Lead Channel Sensing Intrinsic Amplitude: 2.375 mV
Lead Channel Setting Pacing Amplitude: 1.75 V
Lead Channel Setting Pacing Amplitude: 2 V
Lead Channel Setting Pacing Pulse Width: 0.4 ms
Lead Channel Setting Sensing Sensitivity: 2 mV

## 2019-08-25 ENCOUNTER — Telehealth: Payer: Self-pay | Admitting: Neurology

## 2019-08-25 MED ORDER — PREGABALIN 50 MG PO CAPS: 50.0000 mg | ORAL_CAPSULE | Freq: Three times a day (TID) | ORAL | 4 refills | Status: DC

## 2019-08-25 NOTE — Telephone Encounter (Signed)
I called in Lyrica 50mg  tid, 270 tablets with 4 refills

## 2019-08-28 NOTE — Progress Notes (Signed)
Remote pacemaker transmission.   

## 2019-09-01 ENCOUNTER — Ambulatory Visit (INDEPENDENT_AMBULATORY_CARE_PROVIDER_SITE_OTHER): Payer: Medicare Other | Admitting: Physician Assistant

## 2019-09-01 ENCOUNTER — Other Ambulatory Visit: Payer: Self-pay

## 2019-09-01 ENCOUNTER — Ambulatory Visit (HOSPITAL_COMMUNITY)
Admission: RE | Admit: 2019-09-01 | Discharge: 2019-09-01 | Disposition: A | Discharge status: Home / Self Care | Payer: Medicare Other | Source: Ambulatory Visit | Attending: Vascular Surgery | Admitting: Vascular Surgery

## 2019-09-01 VITALS — BP 116/64 | HR 63 | Temp 97.5°F | Resp 14 | Ht 63.5 in | Wt 123.0 lb

## 2019-09-01 DIAGNOSIS — I739 Peripheral vascular disease, unspecified: Secondary | ICD-10-CM | POA: Diagnosis present
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
## 2019-09-05 ENCOUNTER — Other Ambulatory Visit: Payer: Self-pay | Admitting: *Deleted

## 2019-09-05 DIAGNOSIS — I739 Peripheral vascular disease, unspecified: Secondary | ICD-10-CM

## 2019-10-31 ENCOUNTER — Encounter: Payer: Self-pay | Admitting: *Deleted

## 2019-11-06 ENCOUNTER — Encounter: Payer: PRIVATE HEALTH INSURANCE | Admitting: Cardiovascular Disease

## 2019-11-23 ENCOUNTER — Ambulatory Visit (INDEPENDENT_AMBULATORY_CARE_PROVIDER_SITE_OTHER): Payer: Medicare Other | Admitting: *Deleted

## 2019-11-23 DIAGNOSIS — I495 Sick sinus syndrome: Secondary | ICD-10-CM | POA: Diagnosis not present

## 2019-11-26 LAB — CUP PACEART REMOTE DEVICE CHECK
Battery Remaining Longevity: 46 mo
Battery Voltage: 2.98 V
Brady Statistic AP VP Percent: 3.98 %
Brady Statistic AP VS Percent: 90.72 %
Brady Statistic AS VP Percent: 2.52 %
Brady Statistic AS VS Percent: 2.78 %
Brady Statistic RA Percent Paced: 92.15 %
Brady Statistic RV Percent Paced: 6.48 %
Date Time Interrogation Session: 20210820162041
Implantable Lead Implant Date: 20151215
Implantable Lead Implant Date: 20151215
Implantable Lead Location: 753859
Implantable Lead Location: 753860
Implantable Lead Model: 5076
Implantable Lead Model: 5076
Implantable Pulse Generator Implant Date: 20151215
Lead Channel Impedance Value: 361 Ohm
Lead Channel Impedance Value: 380 Ohm
Lead Channel Impedance Value: 399 Ohm
Lead Channel Impedance Value: 475 Ohm
Lead Channel Pacing Threshold Amplitude: 0.875 V
Lead Channel Pacing Threshold Amplitude: 1 V
Lead Channel Pacing Threshold Pulse Width: 0.4 ms
Lead Channel Pacing Threshold Pulse Width: 0.4 ms
Lead Channel Sensing Intrinsic Amplitude: 1.5 mV
Lead Channel Sensing Intrinsic Amplitude: 1.5 mV
Lead Channel Sensing Intrinsic Amplitude: 16.125 mV
Lead Channel Sensing Intrinsic Amplitude: 16.125 mV
Lead Channel Setting Pacing Amplitude: 1.75 V
Lead Channel Setting Pacing Amplitude: 2 V
Lead Channel Setting Pacing Pulse Width: 0.4 ms
Lead Channel Setting Sensing Sensitivity: 2 mV

## 2019-11-27 NOTE — Progress Notes (Signed)
Remote pacemaker transmission.   

## 2020-02-24 ENCOUNTER — Other Ambulatory Visit: Payer: Self-pay | Admitting: Neurology

## 2020-04-02 ENCOUNTER — Other Ambulatory Visit: Payer: Self-pay | Admitting: *Deleted

## 2020-04-02 DIAGNOSIS — I739 Peripheral vascular disease, unspecified: Secondary | ICD-10-CM

## 2020-04-11 ENCOUNTER — Emergency Department (HOSPITAL_COMMUNITY): Payer: Medicare Other

## 2020-04-11 ENCOUNTER — Other Ambulatory Visit: Payer: Self-pay

## 2020-04-11 ENCOUNTER — Inpatient Hospital Stay (HOSPITAL_COMMUNITY): Payer: Medicare Other

## 2020-04-11 ENCOUNTER — Inpatient Hospital Stay (HOSPITAL_COMMUNITY)
Admission: EM | Admit: 2020-04-11 | Discharge: 2020-04-12 | DRG: 123 | Disposition: A | Discharge status: Home Health | Payer: Medicare Other | Attending: Neurology | Admitting: Neurology

## 2020-04-11 ENCOUNTER — Encounter (HOSPITAL_COMMUNITY): Payer: Self-pay

## 2020-04-11 DIAGNOSIS — I5042 Chronic combined systolic (congestive) and diastolic (congestive) heart failure: Secondary | ICD-10-CM | POA: Diagnosis present

## 2020-04-11 DIAGNOSIS — I2510 Atherosclerotic heart disease of native coronary artery without angina pectoris: Secondary | ICD-10-CM | POA: Diagnosis present

## 2020-04-11 DIAGNOSIS — Z888 Allergy status to other drugs, medicaments and biological substances status: Secondary | ICD-10-CM

## 2020-04-11 DIAGNOSIS — G4733 Obstructive sleep apnea (adult) (pediatric): Secondary | ICD-10-CM | POA: Diagnosis present

## 2020-04-11 DIAGNOSIS — H539 Unspecified visual disturbance: Secondary | ICD-10-CM

## 2020-04-11 DIAGNOSIS — M316 Other giant cell arteritis: Secondary | ICD-10-CM | POA: Diagnosis present

## 2020-04-11 DIAGNOSIS — Z8049 Family history of malignant neoplasm of other genital organs: Secondary | ICD-10-CM | POA: Diagnosis not present

## 2020-04-11 DIAGNOSIS — Z79899 Other long term (current) drug therapy: Secondary | ICD-10-CM

## 2020-04-11 DIAGNOSIS — H47012 Ischemic optic neuropathy, left eye: Secondary | ICD-10-CM | POA: Diagnosis present

## 2020-04-11 DIAGNOSIS — I6320 Cerebral infarction due to unspecified occlusion or stenosis of unspecified precerebral arteries: Secondary | ICD-10-CM

## 2020-04-11 DIAGNOSIS — R2990 Unspecified symptoms and signs involving the nervous system: Secondary | ICD-10-CM

## 2020-04-11 DIAGNOSIS — Z87891 Personal history of nicotine dependence: Secondary | ICD-10-CM

## 2020-04-11 DIAGNOSIS — Z7982 Long term (current) use of aspirin: Secondary | ICD-10-CM | POA: Diagnosis not present

## 2020-04-11 DIAGNOSIS — E7800 Pure hypercholesterolemia, unspecified: Secondary | ICD-10-CM | POA: Diagnosis present

## 2020-04-11 DIAGNOSIS — H5340 Unspecified visual field defects: Secondary | ICD-10-CM

## 2020-04-11 DIAGNOSIS — Z8249 Family history of ischemic heart disease and other diseases of the circulatory system: Secondary | ICD-10-CM

## 2020-04-11 DIAGNOSIS — I639 Cerebral infarction, unspecified: Secondary | ICD-10-CM | POA: Diagnosis present

## 2020-04-11 DIAGNOSIS — E039 Hypothyroidism, unspecified: Secondary | ICD-10-CM | POA: Diagnosis present

## 2020-04-11 DIAGNOSIS — I6300 Cerebral infarction due to thrombosis of unspecified precerebral artery: Secondary | ICD-10-CM | POA: Diagnosis not present

## 2020-04-11 DIAGNOSIS — K219 Gastro-esophageal reflux disease without esophagitis: Secondary | ICD-10-CM | POA: Diagnosis present

## 2020-04-11 DIAGNOSIS — H9190 Unspecified hearing loss, unspecified ear: Secondary | ICD-10-CM | POA: Diagnosis present

## 2020-04-11 DIAGNOSIS — I480 Paroxysmal atrial fibrillation: Secondary | ICD-10-CM | POA: Diagnosis present

## 2020-04-11 DIAGNOSIS — I63431 Cerebral infarction due to embolism of right posterior cerebral artery: Principal | ICD-10-CM | POA: Diagnosis present

## 2020-04-11 DIAGNOSIS — I63111 Cerebral infarction due to embolism of right vertebral artery: Secondary | ICD-10-CM | POA: Diagnosis present

## 2020-04-11 DIAGNOSIS — Z801 Family history of malignant neoplasm of trachea, bronchus and lung: Secondary | ICD-10-CM

## 2020-04-11 DIAGNOSIS — H53462 Homonymous bilateral field defects, left side: Secondary | ICD-10-CM | POA: Diagnosis present

## 2020-04-11 DIAGNOSIS — Z806 Family history of leukemia: Secondary | ICD-10-CM | POA: Diagnosis not present

## 2020-04-11 DIAGNOSIS — I130 Hypertensive heart and chronic kidney disease with heart failure and stage 1 through stage 4 chronic kidney disease, or unspecified chronic kidney disease: Secondary | ICD-10-CM | POA: Diagnosis present

## 2020-04-11 DIAGNOSIS — N183 Chronic kidney disease, stage 3 unspecified: Secondary | ICD-10-CM | POA: Diagnosis present

## 2020-04-11 DIAGNOSIS — G629 Polyneuropathy, unspecified: Secondary | ICD-10-CM | POA: Diagnosis present

## 2020-04-11 DIAGNOSIS — I6389 Other cerebral infarction: Secondary | ICD-10-CM

## 2020-04-11 DIAGNOSIS — R29701 NIHSS score 1: Secondary | ICD-10-CM | POA: Diagnosis present

## 2020-04-11 DIAGNOSIS — Z20822 Contact with and (suspected) exposure to covid-19: Secondary | ICD-10-CM | POA: Diagnosis present

## 2020-04-11 DIAGNOSIS — E785 Hyperlipidemia, unspecified: Secondary | ICD-10-CM | POA: Diagnosis present

## 2020-04-11 DIAGNOSIS — Z791 Long term (current) use of non-steroidal anti-inflammatories (NSAID): Secondary | ICD-10-CM | POA: Diagnosis not present

## 2020-04-11 DIAGNOSIS — H34232 Retinal artery branch occlusion, left eye: Secondary | ICD-10-CM | POA: Diagnosis present

## 2020-04-11 DIAGNOSIS — H538 Other visual disturbances: Secondary | ICD-10-CM | POA: Diagnosis present

## 2020-04-11 DIAGNOSIS — Z950 Presence of cardiac pacemaker: Secondary | ICD-10-CM

## 2020-04-11 DIAGNOSIS — Z96652 Presence of left artificial knee joint: Secondary | ICD-10-CM | POA: Diagnosis present

## 2020-04-11 DIAGNOSIS — I252 Old myocardial infarction: Secondary | ICD-10-CM

## 2020-04-11 DIAGNOSIS — Z91040 Latex allergy status: Secondary | ICD-10-CM

## 2020-04-11 DIAGNOSIS — Z833 Family history of diabetes mellitus: Secondary | ICD-10-CM

## 2020-04-11 DIAGNOSIS — Z89621 Acquired absence of right hip joint: Secondary | ICD-10-CM

## 2020-04-11 DIAGNOSIS — Z79890 Hormone replacement therapy (postmenopausal): Secondary | ICD-10-CM

## 2020-04-11 DIAGNOSIS — I495 Sick sinus syndrome: Secondary | ICD-10-CM | POA: Diagnosis present

## 2020-04-11 DIAGNOSIS — Z882 Allergy status to sulfonamides status: Secondary | ICD-10-CM

## 2020-04-11 DIAGNOSIS — Z831 Family history of other infectious and parasitic diseases: Secondary | ICD-10-CM

## 2020-04-11 LAB — RESP PANEL BY RT-PCR (FLU A&B, COVID) ARPGX2
Influenza A by PCR: NEGATIVE
Influenza B by PCR: NEGATIVE
SARS Coronavirus 2 by RT PCR: NEGATIVE

## 2020-04-11 LAB — CBC
HCT: 39.5 % (ref 36.0–46.0)
HCT: 44.5 % (ref 36.0–46.0)
Hemoglobin: 12.8 g/dL (ref 12.0–15.0)
Hemoglobin: 14 g/dL (ref 12.0–15.0)
MCH: 29.1 pg (ref 26.0–34.0)
MCH: 28.5 pg (ref 26.0–34.0)
MCHC: 32.4 g/dL (ref 30.0–36.0)
MCHC: 31.5 g/dL (ref 30.0–36.0)
MCV: 89.8 fL (ref 80.0–100.0)
MCV: 90.6 fL (ref 80.0–100.0)
Platelets: 190 10*3/uL (ref 150–400)
Platelets: 159 10*3/uL (ref 150–400)
RBC: 4.4 MIL/uL (ref 3.87–5.11)
RBC: 4.91 MIL/uL (ref 3.87–5.11)
RDW: 14.6 % (ref 11.5–15.5)
RDW: 14.5 % (ref 11.5–15.5)
WBC: 6.9 10*3/uL (ref 4.0–10.5)
WBC: 5.9 10*3/uL (ref 4.0–10.5)
nRBC: 0 % (ref 0.0–0.2)
nRBC: 0 % (ref 0.0–0.2)

## 2020-04-11 LAB — DIFFERENTIAL
Abs Immature Granulocytes: 0.02 10*3/uL (ref 0.00–0.07)
Basophils Absolute: 0 10*3/uL (ref 0.0–0.1)
Basophils Relative: 0 %
Eosinophils Absolute: 0.1 10*3/uL (ref 0.0–0.5)
Eosinophils Relative: 2 %
Immature Granulocytes: 0 %
Lymphocytes Relative: 32 %
Lymphs Abs: 2.2 10*3/uL (ref 0.7–4.0)
Monocytes Absolute: 0.6 10*3/uL (ref 0.1–1.0)
Monocytes Relative: 8 %
Neutro Abs: 3.9 10*3/uL (ref 1.7–7.7)
Neutrophils Relative %: 58 %

## 2020-04-11 LAB — I-STAT CHEM 8, ED
BUN: 29 mg/dL — ABNORMAL HIGH (ref 8–23)
Calcium, Ion: 1.03 mmol/L — ABNORMAL LOW (ref 1.15–1.40)
Chloride: 104 mmol/L (ref 98–111)
Creatinine, Ser: 1 mg/dL (ref 0.44–1.00)
Glucose, Bld: 113 mg/dL — ABNORMAL HIGH (ref 70–99)
HCT: 38 % (ref 36.0–46.0)
Hemoglobin: 12.9 g/dL (ref 12.0–15.0)
Potassium: 3.4 mmol/L — ABNORMAL LOW (ref 3.5–5.1)
Sodium: 141 mmol/L (ref 135–145)
TCO2: 25 mmol/L (ref 22–32)

## 2020-04-11 LAB — URINALYSIS, ROUTINE W REFLEX MICROSCOPIC
Bilirubin Urine: NEGATIVE
Glucose, UA: NEGATIVE mg/dL
Ketones, ur: NEGATIVE mg/dL
Leukocytes,Ua: NEGATIVE
Nitrite: NEGATIVE
Protein, ur: 30 mg/dL — AB
Specific Gravity, Urine: 1.04 — ABNORMAL HIGH (ref 1.005–1.030)
pH: 7 (ref 5.0–8.0)

## 2020-04-11 LAB — ECHOCARDIOGRAM COMPLETE
Height: 63 in
S' Lateral: 2.2 cm
Weight: 1932.99 oz

## 2020-04-11 LAB — COMPREHENSIVE METABOLIC PANEL
ALT: 24 U/L (ref 0–44)
AST: 38 U/L (ref 15–41)
Albumin: 3.7 g/dL (ref 3.5–5.0)
Alkaline Phosphatase: 75 U/L (ref 38–126)
Anion gap: 14 (ref 5–15)
BUN: 29 mg/dL — ABNORMAL HIGH (ref 8–23)
CO2: 25 mmol/L (ref 22–32)
Calcium: 8.9 mg/dL (ref 8.9–10.3)
Chloride: 102 mmol/L (ref 98–111)
Creatinine, Ser: 1.04 mg/dL — ABNORMAL HIGH (ref 0.44–1.00)
GFR, Estimated: 51 mL/min — ABNORMAL LOW (ref >60)
Glucose, Bld: 113 mg/dL — ABNORMAL HIGH (ref 70–99)
Potassium: 4.4 mmol/L (ref 3.5–5.1)
Sodium: 141 mmol/L (ref 135–145)
Total Bilirubin: 1 mg/dL (ref 0.3–1.2)
Total Protein: 6.5 g/dL (ref 6.5–8.1)

## 2020-04-11 LAB — GLUCOSE, CAPILLARY
Glucose-Capillary: 121 mg/dL — ABNORMAL HIGH (ref 70–99)
Glucose-Capillary: 145 mg/dL — ABNORMAL HIGH (ref 70–99)
Glucose-Capillary: 207 mg/dL — ABNORMAL HIGH (ref 70–99)
Glucose-Capillary: 244 mg/dL — ABNORMAL HIGH (ref 70–99)

## 2020-04-11 LAB — MRSA PCR SCREENING: MRSA by PCR: NEGATIVE

## 2020-04-11 LAB — PROTIME-INR
INR: 1.1 (ref 0.8–1.2)
Prothrombin Time: 13.6 seconds (ref 11.4–15.2)

## 2020-04-11 LAB — C-REACTIVE PROTEIN: CRP: 0.6 mg/dL (ref <1.0)

## 2020-04-11 LAB — RAPID URINE DRUG SCREEN, HOSP PERFORMED
Amphetamines: NOT DETECTED
Barbiturates: NOT DETECTED
Benzodiazepines: NOT DETECTED
Cocaine: NOT DETECTED
Opiates: NOT DETECTED
Tetrahydrocannabinol: NOT DETECTED

## 2020-04-11 LAB — LIPID PANEL
Cholesterol: 133 mg/dL (ref 0–200)
HDL: 56 mg/dL (ref >40)
LDL Cholesterol: 58 mg/dL (ref 0–99)
Total CHOL/HDL Ratio: 2.4 RATIO
Triglycerides: 97 mg/dL (ref <150)
VLDL: 19 mg/dL (ref 0–40)

## 2020-04-11 LAB — APTT: aPTT: 31 seconds (ref 24–36)

## 2020-04-11 LAB — HEMOGLOBIN A1C
Hgb A1c MFr Bld: 7 % — ABNORMAL HIGH (ref 4.8–5.6)
Mean Plasma Glucose: 154.2 mg/dL

## 2020-04-11 LAB — SEDIMENTATION RATE: Sed Rate: 25 mm/hr — ABNORMAL HIGH (ref 0–22)

## 2020-04-11 LAB — ETHANOL: Alcohol, Ethyl (B): <10 mg/dL (ref <10)

## 2020-04-11 MED ORDER — LABETALOL HCL 5 MG/ML IV SOLN
INTRAVENOUS | Status: AC
  Administered 2020-04-11: 10 mg
  Filled 2020-04-11: qty 4

## 2020-04-11 MED ORDER — ACETAMINOPHEN 650 MG RE SUPP: 650.0000 mg | RECTAL | Status: DC | PRN

## 2020-04-11 MED ORDER — TORSEMIDE 20 MG PO TABS
30.0000 mg | ORAL_TABLET | Freq: Every day | ORAL | Status: DC
Start: 2020-04-11 — End: 2020-04-12
  Administered 2020-04-11 – 2020-04-12 (×2): 30 mg via ORAL
  Filled 2020-04-11 (×2): qty 2

## 2020-04-11 MED ORDER — ACETAMINOPHEN 325 MG PO TABS
650.0000 mg | ORAL_TABLET | ORAL | Status: DC | PRN
  Administered 2020-04-11 – 2020-04-12 (×4): 650 mg via ORAL
  Filled 2020-04-11 (×4): qty 2

## 2020-04-11 MED ORDER — ALTEPLASE (STROKE) FULL DOSE INFUSION
0.9000 mg/kg | Freq: Once | INTRAVENOUS | Status: AC
  Administered 2020-04-11: 54.9 mg via INTRAVENOUS
  Filled 2020-04-11: qty 100

## 2020-04-11 MED ORDER — CHLORHEXIDINE GLUCONATE CLOTH 2 % EX PADS
6.0000 | MEDICATED_PAD | Freq: Every day | CUTANEOUS | Status: DC
  Administered 2020-04-11 – 2020-04-12 (×2): 6 via TOPICAL

## 2020-04-11 MED ORDER — LABETALOL HCL 5 MG/ML IV SOLN
10.0000 mg | INTRAVENOUS | Status: DC | PRN
Start: 2020-04-11 — End: 2020-04-12
  Administered 2020-04-11: 10 mg via INTRAVENOUS
  Filled 2020-04-11: qty 4

## 2020-04-11 MED ORDER — IOHEXOL 350 MG/ML SOLN
75.0000 mL | Freq: Once | INTRAVENOUS | Status: AC | PRN
  Administered 2020-04-11: 75 mL via INTRAVENOUS

## 2020-04-11 MED ORDER — STROKE: EARLY STAGES OF RECOVERY BOOK: Freq: Once | Status: AC

## 2020-04-11 MED ORDER — LEVOTHYROXINE SODIUM 100 MCG PO TABS
100.0000 ug | ORAL_TABLET | Freq: Every day | ORAL | Status: DC
  Administered 2020-04-11 – 2020-04-12 (×2): 100 ug via ORAL
  Filled 2020-04-11 (×2): qty 1

## 2020-04-11 MED ORDER — HYDRALAZINE HCL 20 MG/ML IJ SOLN: 10.0000 mg | Freq: Four times a day (QID) | INTRAMUSCULAR | Status: DC | PRN

## 2020-04-11 MED ORDER — PANTOPRAZOLE SODIUM 40 MG IV SOLR
40.0000 mg | Freq: Every day | INTRAVENOUS | Status: DC
  Administered 2020-04-11: 40 mg via INTRAVENOUS
  Filled 2020-04-11: qty 40

## 2020-04-11 MED ORDER — MONTELUKAST SODIUM 10 MG PO TABS
10.0000 mg | ORAL_TABLET | Freq: Every day | ORAL | Status: DC
  Administered 2020-04-11: 10 mg via ORAL
  Filled 2020-04-11: qty 1

## 2020-04-11 MED ORDER — ROSUVASTATIN CALCIUM 20 MG PO TABS
20.0000 mg | ORAL_TABLET | Freq: Every day | ORAL | Status: DC
  Administered 2020-04-11 – 2020-04-12 (×2): 20 mg via ORAL
  Filled 2020-04-11 (×2): qty 1

## 2020-04-11 MED ORDER — MIRABEGRON ER 25 MG PO TB24
25.0000 mg | ORAL_TABLET | Freq: Every day | ORAL | Status: DC
  Administered 2020-04-11 – 2020-04-12 (×2): 25 mg via ORAL
  Filled 2020-04-11 (×2): qty 1

## 2020-04-11 MED ORDER — ACETAMINOPHEN 160 MG/5ML PO SOLN: 650.0000 mg | ORAL | Status: DC | PRN

## 2020-04-11 MED ORDER — SODIUM CHLORIDE 0.9 % IV SOLN
50.0000 mL | Freq: Once | INTRAVENOUS | Status: AC
  Administered 2020-04-11: 50 mL via INTRAVENOUS

## 2020-04-11 MED ORDER — CLEVIDIPINE BUTYRATE 0.5 MG/ML IV EMUL: 0.0000 mg/h | INTRAVENOUS | Status: DC

## 2020-04-11 MED ORDER — SODIUM CHLORIDE 0.9 % IV SOLN
50.0000 mL/h | INTRAVENOUS | Status: DC
  Administered 2020-04-11: 50 mL/h via INTRAVENOUS

## 2020-04-11 MED ORDER — PREGABALIN 50 MG PO CAPS
50.0000 mg | ORAL_CAPSULE | Freq: Three times a day (TID) | ORAL | Status: DC
  Administered 2020-04-11 – 2020-04-12 (×5): 50 mg via ORAL
  Filled 2020-04-11 (×5): qty 1

## 2020-04-11 NOTE — Progress Notes (Signed)
PT Cancellation Note  Patient Details Name: Kelsey Joseph MRN: [id number] DOB: 25-Mar-1928   Cancelled Treatment:    Reason Eval/Treat Not Completed: Active bedrest order Pt currently with strict bedrest orders and awaiting transfer to ICU. Will hold until pt medically appropriate and follow up as schedule allows.   Farley Ly, PT, DPT  Acute Rehabilitation Services  Pager: [phone] Office: [phone]    [name] 04/11/2020, 9:00 AM

## 2020-04-11 NOTE — ED Triage Notes (Signed)
Assume care from EMS, EMS reports around 1130pm pt had got a "weird feeling" and started feeling light headed, dizziness, and blur vision. Pt states feeling "tightness around head" but denies headache   EMS VS  98.5 176/76 Hr 67 O2 97 RR 16 CBG 129

## 2020-04-11 NOTE — ED Notes (Signed)
Paged Dr. Amada Jupiter for pt

## 2020-04-11 NOTE — ED Notes (Signed)
Pt has Right leg ampuation

## 2020-04-11 NOTE — ED Provider Notes (Signed)
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 None. ASPECTS [facility] Stroke Program Early CT Score) - Ganglionic level infarction (caudate, lentiform nuclei, internal capsule, insula, M1-M3 cortex): 7 - Supraganglionic infarction (M4-M6 cortex): 3 Total score (0-10 with 10 being normal): 10 IMPRESSION: 1. No acute intracranial infarct or other abnormality. 2. ASPECTS is 10. 3. Age-related cerebral atrophy with chronic microvascular ischemic disease. These results were communicated to Dr. Amada Jupiter at 2:50 amon 1/6/2022by text page via the [facility] messaging system. Electronically Signed   By: Rise Mu M.D.   On: 04/11/2020 02:51    Procedures .Critical Care Performed by: Shon Baton, MD Authorized by: Shon Baton, MD   Critical care provider statement:    Critical care time (minutes):  40   Critical care was necessary to treat or prevent imminent or life-threatening deterioration  of the following conditions: Acute stroke.   Critical care was time spent personally by me on the following activities:  Discussions with consultants,  development of treatment plan with patient or surrogate, ordering and review of laboratory studies, ordering and review of radiographic studies and ordering and performing treatments and interventions   (including critical care time)  Medications Ordered in ED Medications  alteplase (ACTIVASE) 1 mg/mL infusion 54.9 mg (0 mg Intravenous Paused 04/11/20 0340)    Followed by  0.9 %  sodium chloride infusion (has no administration in time range)  0.9 %  sodium chloride infusion (has no administration in time range)  acetaminophen (TYLENOL) tablet 650 mg (has no administration in time range)    Or  acetaminophen (TYLENOL) 160 MG/5ML solution 650 mg (has no administration in time range)    Or  acetaminophen (TYLENOL) suppository 650 mg (has no administration in time range)  pantoprazole (PROTONIX) injection 40 mg (has no administration in time range)  clevidipine (CLEVIPREX) infusion 0.5 mg/mL (0 mg/hr Intravenous Hold 04/11/20 0346)  iohexol (OMNIPAQUE) 350 MG/ML injection 75 mL (75 mLs Intravenous Contrast Given 04/11/20 0255)   stroke: mapping our early stages of recovery book ( Does not apply Given 04/11/20 0334)  labetalol (NORMODYNE) 5 MG/ML injection (10 mg  Given 04/11/20 0341)    ED Course  I have reviewed the triage vital signs and the nursing notes.  Pertinent labs & imaging results that were available during my care of the patient were reviewed by me and considered in my medical decision making (see chart for details).  Clinical Course as of 04/11/20 0348  Thu Apr 11, 2020  0219 Code stroke activated based on last seen normal of 12:30 AM and noted neurodeficit of left homonymous quadrantanopia. [CH]    Clinical Course User Index [CH] Jermario Kalmar, Barbette Hair, MD   MDM Rules/Calculators/A&P                          Patient presents with left homonymous superior quadrantanopia.  Onset of symptoms approximately 2 hours prior to arrival.  Code stroke was initiated.  She otherwise does not have  any significant stroke symptoms and is other wise neurologically intact.  Neurology is at the bedside.  Patient went for CT scan of the head that shows no acute bleed.  CTA of the head and neck was ordered.  Patient does have a history of atrial fibrillation but is not on any anticoagulants.  EKG today shows a paced rhythm.  Work-up notable for abrupt occlusion of the left distal vertebral artery likely a dissection.  This fits with the patient's deficits.  Risk and benefits of tPA were discussed by neurology.  Patient decided to move forward with tPA as she felt her deficits were life changing for her.  She understood the risk and benefits.  Patient being managed primarily by neurology following tPA administration.  Labs otherwise reviewed and largely reassuring. Final Clinical Impression(s) / ED Diagnoses Final diagnoses:  Left homonymous superior quadrantanopia    Rx / DC Orders ED Discharge Orders    None       Corley Maffeo, Barbette Hair, MD 04/11/20 [phone]

## 2020-04-11 NOTE — Progress Notes (Signed)
PHARMACIST CODE STROKE RESPONSE  Notified to mix tPA at 0322 by Dr. Amada Jupiter Delivered tPA to RN at 0327  tPA dose = 5.5 mg bolus over 1 minute followed by 49.4 mg for a total dose of 54.9 mg over 1 hour  Issues/delays encountered (if applicable): First Alaris pump channel failed so had to hang on second module  Christoper Fabian, PharmD, BCPS Please see amion for complete clinical pharmacist phone list 04/11/20 3:36 AM

## 2020-04-11 NOTE — Progress Notes (Signed)
STROKE TEAM PROGRESS NOTE   INTERVAL HISTORY  This morning she is reporting resolution of her blurred vision. She describes visual loss as upper areas of vision but unsure if unilateral or bilateral. Mild to moderate associated headache which appears to resolve. She denies any prior history of recurrent headaches vision loss. She denies any history of scalp claudication, jaw pain or myalgias or arthralgias. Blood pressure is adequately controlled.  Vitals:   04/11/20 1117 04/11/20 1130 04/11/20 1200 04/11/20 1215  BP:  (!) 179/70 (!) 183/75 (!) 182/62  Pulse:  70 66 66  Resp:  17 (!) 9 11  Temp: 98.2 F (36.8 C)     TempSrc: Oral     SpO2:  95% 97% 95%  Weight:      Height:       CBC:  Recent Labs  Lab 04/11/20 0225 04/11/20 0234 04/11/20 0442  WBC 6.9  --  5.9  NEUTROABS 3.9  --   --   HGB 12.8 12.9 14.0  HCT 39.5 38.0 44.5  MCV 89.8  --  90.6  PLT 190  --  342   Basic Metabolic Panel:  Recent Labs  Lab 04/11/20 0225 04/11/20 0234  NA 141 141  K 4.4 3.4*  CL 102 104  CO2 25  --   GLUCOSE 113* 113*  BUN 29* 29*  CREATININE 1.04* 1.00  CALCIUM 8.9  --    Lipid Panel:  Recent Labs  Lab 04/11/20 0443  CHOL 133  TRIG 97  HDL 56  CHOLHDL 2.4  VLDL 19  LDLCALC 58   HgbA1c:  Recent Labs  Lab 04/11/20 0443  HGBA1C 7.0*   Urine Drug Screen:  Recent Labs  Lab 04/11/20 0952  LABOPIA NONE DETECTED  COCAINSCRNUR NONE DETECTED  LABBENZ NONE DETECTED  AMPHETMU NONE DETECTED  THCU NONE DETECTED  LABBARB NONE DETECTED    Alcohol Level  Recent Labs  Lab 04/11/20 0225  ETH <10    IMAGING past 24 hours CT Code Stroke CTA Head W/WO contrast  Result Date: 04/11/2020 CLINICAL DATA:  Initial evaluation for acute stroke, blurry vision. EXAM: CT ANGIOGRAPHY HEAD AND NECK TECHNIQUE: Multidetector CT imaging of the head and neck was performed using the standard protocol during bolus administration of intravenous contrast. Multiplanar CT image reconstructions and  MIPs were obtained to evaluate the vascular anatomy. Carotid stenosis measurements (when applicable) are obtained utilizing NASCET criteria, using the distal internal carotid diameter as the denominator. CONTRAST:  68m OMNIPAQUE IOHEXOL 350 MG/ML SOLN COMPARISON:  Prior head CT from earlier same day. FINDINGS: CTA NECK FINDINGS Aortic arch: Visualized aortic arch of normal caliber with normal 3 vessel morphology. Moderate atheromatous change about the arch and origin of the great vessels without hemodynamically significant stenosis. Right carotid system: Right CCA patent from its origin to the bifurcation without stenosis. Eccentric calcified plaque at the right bifurcation without significant stenosis. Right ICA widely patent distally without stenosis, dissection or occlusion. Left carotid system: Left CCA patent from its origin to the bifurcation eccentric calcified plaque at the left bifurcation/proximal left ICA without significant stenosis. Left ICA widely patent distally without stenosis, dissection or occlusion. Vertebral arteries: Both vertebral arteries arise from the subclavian arteries. No proximal subclavian artery stenosis. Atheromatous plaque at the origins of both vertebral arteries with associated moderate to severe ostial stenosis. Right vertebral artery otherwise widely patent within the neck. Diffusely attenuated flow seen within the left V1 and V2 segments. There is fairly abrupt occlusion of the distal  left vertebral artery just proximal to the V2/V3 junction, suspected to reflect an acute dissection (series 7, image 3026). Small irregular filling defect at this level consistent with intraluminal thrombus. Left vertebral otherwise remains occluded to the cranial vault. Skeleton: No visible acute osseous abnormality. No discrete or worrisome osseous lesions. Moderate cervical spondylosis at C5-6 and C6-7 without high-grade stenosis. Other neck: No other acute soft tissue abnormality within the  neck. No mass or adenopathy. Upper chest: Enlarged 1.4 cm precarinal node, indeterminate, but could be reactive. Additional mildly enlarged 1.1 cm prevascular node. Diffuse ground-glass opacity with interlobular septal thickening seen within the visualized lungs, likely reflecting pulmonary edema. Left-sided pacemaker/AICD noted. Review of the MIP images confirms the above findings CTA HEAD FINDINGS Anterior circulation: Petrous segments patent bilaterally. Scattered atheromatous plaque within the carotid siphons with associated mild to moderate narrowing, left worse than right. A1 segments patent bilaterally. Normal anterior communicating artery complex. Anterior cerebral arteries widely patent to their distal aspects. M1 segments mildly irregular but are widely patent without stenosis. Normal MCA bifurcations. Distal MCA branches well perfused and symmetric. Posterior circulation: Right V4 segment widely patent to the vertebrobasilar junction. Right PICA origin patent and normal. Left vertebral artery occluded as it courses into the cranial vault. Opacification of the distal left V4 segment with perfusion of the left PICA, likely retrograde in nature across the vertebrobasilar junction. Basilar patent distally without stenosis or other abnormality. Superior cerebellar arteries patent bilaterally. Right PCA supplied via the basilar. Fetal type origin of the left PCA supplied via a robust left posterior communicating artery. Left PCA widely patent to its distal aspect. On the right, there is abrupt occlusion of a distal right P3 branch, presumably embolic from the left vertebral dissection (series 9, image 150). Venous sinuses: Patent allowing for timing the contrast bolus. Anatomic variants: Fetal type origin of the left PCA.  No aneurysm. Review of the MIP images confirms the above findings IMPRESSION: 1. Abrupt occlusion of the distal left vertebral artery just proximal to the V2/V3 junction, likely reflecting an  acute dissection. Left vertebral artery a remains occluded to the cranial vault. Perfusion of the distal left V4 segment and left PICA likely retrograde in nature across the vertebrobasilar junction. 2. Acute distal right P3 occlusion, presumably embolic in nature. Right PCA supplied via the basilar, with a fetal type origin left PCA. 3. Atheromatous plaque at the origins of both vertebral arteries with associated moderate to severe ostial stenosis. 4. Diffuse ground-glass opacity with interlobular septal thickening within the visualized lungs, likely reflecting pulmonary edema. 5. Enlarged mediastinal adenopathy, indeterminate, but could be reactive. Critical Value/emergent results were called by telephone at the time of interpretation on 04/11/2020 at 3:00 am to provider [facility] , who verbally acknowledged these results. Electronically Signed   By: Jeannine Boga M.D.   On: 04/11/2020 03:40   CT Code Stroke CTA Neck W/WO contrast  Result Date: 04/11/2020 CLINICAL DATA:  Initial evaluation for acute stroke, blurry vision. EXAM: CT ANGIOGRAPHY HEAD AND NECK TECHNIQUE: Multidetector CT imaging of the head and neck was performed using the standard protocol during bolus administration of intravenous contrast. Multiplanar CT image reconstructions and MIPs were obtained to evaluate the vascular anatomy. Carotid stenosis measurements (when applicable) are obtained utilizing NASCET criteria, using the distal internal carotid diameter as the denominator. CONTRAST:  30m OMNIPAQUE IOHEXOL 350 MG/ML SOLN COMPARISON:  Prior head CT from earlier same day. FINDINGS: CTA NECK FINDINGS Aortic arch: Visualized aortic arch of normal caliber  with normal 3 vessel morphology. Moderate atheromatous change about the arch and origin of the great vessels without hemodynamically significant stenosis. Right carotid system: Right CCA patent from its origin to the bifurcation without stenosis. Eccentric calcified plaque at  the right bifurcation without significant stenosis. Right ICA widely patent distally without stenosis, dissection or occlusion. Left carotid system: Left CCA patent from its origin to the bifurcation eccentric calcified plaque at the left bifurcation/proximal left ICA without significant stenosis. Left ICA widely patent distally without stenosis, dissection or occlusion. Vertebral arteries: Both vertebral arteries arise from the subclavian arteries. No proximal subclavian artery stenosis. Atheromatous plaque at the origins of both vertebral arteries with associated moderate to severe ostial stenosis. Right vertebral artery otherwise widely patent within the neck. Diffusely attenuated flow seen within the left V1 and V2 segments. There is fairly abrupt occlusion of the distal left vertebral artery just proximal to the V2/V3 junction, suspected to reflect an acute dissection (series 7, image 3026). Small irregular filling defect at this level consistent with intraluminal thrombus. Left vertebral otherwise remains occluded to the cranial vault. Skeleton: No visible acute osseous abnormality. No discrete or worrisome osseous lesions. Moderate cervical spondylosis at C5-6 and C6-7 without high-grade stenosis. Other neck: No other acute soft tissue abnormality within the neck. No mass or adenopathy. Upper chest: Enlarged 1.4 cm precarinal node, indeterminate, but could be reactive. Additional mildly enlarged 1.1 cm prevascular node. Diffuse ground-glass opacity with interlobular septal thickening seen within the visualized lungs, likely reflecting pulmonary edema. Left-sided pacemaker/AICD noted. Review of the MIP images confirms the above findings CTA HEAD FINDINGS Anterior circulation: Petrous segments patent bilaterally. Scattered atheromatous plaque within the carotid siphons with associated mild to moderate narrowing, left worse than right. A1 segments patent bilaterally. Normal anterior communicating artery complex.  Anterior cerebral arteries widely patent to their distal aspects. M1 segments mildly irregular but are widely patent without stenosis. Normal MCA bifurcations. Distal MCA branches well perfused and symmetric. Posterior circulation: Right V4 segment widely patent to the vertebrobasilar junction. Right PICA origin patent and normal. Left vertebral artery occluded as it courses into the cranial vault. Opacification of the distal left V4 segment with perfusion of the left PICA, likely retrograde in nature across the vertebrobasilar junction. Basilar patent distally without stenosis or other abnormality. Superior cerebellar arteries patent bilaterally. Right PCA supplied via the basilar. Fetal type origin of the left PCA supplied via a robust left posterior communicating artery. Left PCA widely patent to its distal aspect. On the right, there is abrupt occlusion of a distal right P3 branch, presumably embolic from the left vertebral dissection (series 9, image 150). Venous sinuses: Patent allowing for timing the contrast bolus. Anatomic variants: Fetal type origin of the left PCA.  No aneurysm. Review of the MIP images confirms the above findings IMPRESSION: 1. Abrupt occlusion of the distal left vertebral artery just proximal to the V2/V3 junction, likely reflecting an acute dissection. Left vertebral artery a remains occluded to the cranial vault. Perfusion of the distal left V4 segment and left PICA likely retrograde in nature across the vertebrobasilar junction. 2. Acute distal right P3 occlusion, presumably embolic in nature. Right PCA supplied via the basilar, with a fetal type origin left PCA. 3. Atheromatous plaque at the origins of both vertebral arteries with associated moderate to severe ostial stenosis. 4. Diffuse ground-glass opacity with interlobular septal thickening within the visualized lungs, likely reflecting pulmonary edema. 5. Enlarged mediastinal adenopathy, indeterminate, but could be reactive.  Critical Value/emergent results  were called by telephone at the time of interpretation on 04/11/2020 at 3:00 am to provider [facility] , who verbally acknowledged these results. Electronically Signed   By: Jeannine Boga M.D.   On: 04/11/2020 03:40   CT HEAD CODE STROKE WO CONTRAST  Result Date: 04/11/2020 CLINICAL DATA:  Code stroke. Initial evaluation for acute blurry vision, stroke suspected. EXAM: CT HEAD WITHOUT CONTRAST TECHNIQUE: Contiguous axial images were obtained from the base of the skull through the vertex without intravenous contrast. COMPARISON:  None. FINDINGS: Brain: Age-related cerebral atrophy with moderate chronic microvascular ischemic disease. No acute intracranial hemorrhage. No acute large vessel territory infarct. No mass lesion, midline shift or mass effect. No hydrocephalus or extra-axial fluid collection. Vascular: No hyperdense vessel. Calcified atherosclerosis at the skull base. Skull: Scalp soft tissues and calvarium within normal limits. Sinuses/Orbits: Globes and orbital soft tissues demonstrate no acute finding. Ocular senescent calcifications noted. Paranasal sinuses are largely clear. No significant mastoid effusion. Other: None. ASPECTS [facility] Stroke Program Early CT Score) - Ganglionic level infarction (caudate, lentiform nuclei, internal capsule, insula, M1-M3 cortex): 7 - Supraganglionic infarction (M4-M6 cortex): 3 Total score (0-10 with 10 being normal): 10 IMPRESSION: 1. No acute intracranial infarct or other abnormality. 2. ASPECTS is 10. 3. Age-related cerebral atrophy with chronic microvascular ischemic disease. These results were communicated to Dr. Leonel Ramsay at 2:50 amon 1/6/2022by text page via the [facility] messaging system. Electronically Signed   By: Jeannine Boga M.D.   On: 04/11/2020 02:51   PHYSICAL EXAM Frail elderly Caucasian lady not in distress. . Afebrile. Head is nontraumatic. Neck is supple without bruit.    Cardiac exam no  murmur or gallop. Lungs are clear to auscultation. Distal pulses are well felt. Right leg amputation  Neurological Exam ;  Awake  Alert oriented x 3. Normal speech and language.eye movements full without nystagmus.fundi were not visualized. Vision acuity and fields appear normal. Hearing is normal. Palatal movements are normal. Face symmetric. Tongue midline. Normal strength, tone, reflexes and coordination. Normal sensation. Gait deferred.   ASSESSMENT/PLAN Kelsey Joseph is a 87 y.o. 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   She presented with sudden onset of dizziness with vision disturbance which she describes as blurred vision in the upper visual fields with uncertain laterality. She is s/p TPA given at 0330 1/6.  Probable right occipital infarct versus left eye partial vision loss due to retinal artery branch occlusion versus ischemic optic neuropathy or temporal arteritis -treated with IV tPA with good resolution of symptoms  Code Stroke CT head No acute abnormality. Age-related cerebral atrophy with chronic microvascular ischemic disease. ASPECTS 10.     CTA head & neck: reveals a vertebral occlusion as well as distal P3 occlusion  MRI: not a candidate due to pacemaker/defibrillator  Carotid Doppler: not ordered  2D Echo pending   LDL 58  HgbA1c 7.0  VTE prophylaxis - Holding post TPA x 24 hours     Diet   Diet Heart Room service appropriate? Yes with Assist; Fluid consistency: Thin   ANA, ESR   Therapy  recommendations:   Disposition:  Hypertension  Home meds:  Labetolol 165m BID, Verapamil CR 2421mdaily   PRN labetolol and hydralazine ordered  . Permissive hypertension (OK if < 220/120) but gradually normalize in 5-7 days . Long-term BP goal normotensive  Hyperlipidemia  Home meds: Crestor 2066maily, resumed in hospital  LDL 58, goal < 70  Continue statin at discharge  Other Stroke Risk Factors  Advanced Age >/= 65   10 pack year history cigarette smoker who quit 62 69s ago  Coronary artery disease  CHF    Hospital day # 0 Continue strict blood pressure control and close neurological monitoring as per post tPA protocol. Mobilize out of bed. Therapy consults. Patient will be unable to get MRI due to pacemaker events repeat CT scan. 24 hours post tPA. Continue ongoing stroke work-up. Start aspirin after follow-up CT is negative for bleed. Check ESR, ANA panel and C-reactive protein. Long discussion with patient and answered questions.This patient is critically ill and at significant risk of neurological worsening, death and care requires constant monitoring of vital signs, hemodynamics,respiratory and cardiac monitoring, extensive review of multiple databases, frequent neurological assessment, discussion with family, other specialists and medical decision making of high complexity.I have made any additions or clarifications directly to the above note.This critical care time does not reflect procedure time, or teaching time or supervisory time of PA/NP/Med Resident etc but could involve care discussion time.  I spent 30 minutes of neurocritical care time  in the care of  this patient.  PraAntony ContrasD   To contact Stroke Continuity provider, please refer to [email or web address], contact General Neurology

## 2020-04-11 NOTE — ED Notes (Signed)
Pt reports she has had a headache since her arrival to the ED... Pt reports it was 4/10. Pt reports she would like tylenol for it. MD made aware of patient having a headache.

## 2020-04-11 NOTE — Progress Notes (Signed)
Notified Dr. Pearlean Brownie of elevated SBP slightly over goal. PRN order received. Will continue to monitor.

## 2020-04-11 NOTE — Procedures (Signed)
Heart rate is too high for accurate echo at this time. 

## 2020-04-11 NOTE — Progress Notes (Signed)
Echocardiogram 2D Echocardiogram has been performed.  Kelsey Joseph Kelsey Joseph 04/11/2020, 3:42 PM
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
## 2020-04-12 ENCOUNTER — Inpatient Hospital Stay (HOSPITAL_COMMUNITY): Payer: Medicare Other

## 2020-04-12 DIAGNOSIS — I6300 Cerebral infarction due to thrombosis of unspecified precerebral artery: Secondary | ICD-10-CM | POA: Diagnosis not present

## 2020-04-12 LAB — BASIC METABOLIC PANEL
Anion gap: 11 (ref 5–15)
BUN: 19 mg/dL (ref 8–23)
CO2: 26 mmol/L (ref 22–32)
Calcium: 8.7 mg/dL — ABNORMAL LOW (ref 8.9–10.3)
Chloride: 104 mmol/L (ref 98–111)
Creatinine, Ser: 0.97 mg/dL (ref 0.44–1.00)
GFR, Estimated: 56 mL/min — ABNORMAL LOW (ref >60)
Glucose, Bld: 140 mg/dL — ABNORMAL HIGH (ref 70–99)
Potassium: 3.1 mmol/L — ABNORMAL LOW (ref 3.5–5.1)
Sodium: 141 mmol/L (ref 135–145)

## 2020-04-12 LAB — ANA W/REFLEX IF POSITIVE: Anti Nuclear Antibody (ANA): NEGATIVE

## 2020-04-12 LAB — GLUCOSE, CAPILLARY
Glucose-Capillary: 139 mg/dL — ABNORMAL HIGH (ref 70–99)
Glucose-Capillary: 128 mg/dL — ABNORMAL HIGH (ref 70–99)
Glucose-Capillary: 148 mg/dL — ABNORMAL HIGH (ref 70–99)

## 2020-04-12 MED ORDER — METOPROLOL TARTRATE 25 MG PO TABS
100.0000 mg | ORAL_TABLET | Freq: Two times a day (BID) | ORAL | Status: DC
  Administered 2020-04-12: 100 mg via ORAL
  Filled 2020-04-12: qty 4

## 2020-04-12 MED ORDER — VERAPAMIL HCL ER 240 MG PO TBCR
240.0000 mg | EXTENDED_RELEASE_TABLET | Freq: Every day | ORAL | Status: DC
  Administered 2020-04-12: 240 mg via ORAL
  Filled 2020-04-12: qty 1

## 2020-04-12 MED ORDER — ASPIRIN EC 325 MG PO TBEC
325.0000 mg | DELAYED_RELEASE_TABLET | Freq: Every day | ORAL | Status: DC
  Administered 2020-04-12: 325 mg via ORAL
  Filled 2020-04-12: qty 1

## 2020-04-12 MED ORDER — ASPIRIN EC 81 MG PO TBEC: 81.0000 mg | DELAYED_RELEASE_TABLET | Freq: Every day | ORAL | 3 refills | Status: DC

## 2020-04-12 MED ORDER — TRAMADOL HCL 50 MG PO TABS
50.0000 mg | ORAL_TABLET | Freq: Four times a day (QID) | ORAL | Status: DC | PRN
  Administered 2020-04-12: 50 mg via ORAL
  Filled 2020-04-12: qty 1

## 2020-04-12 MED ORDER — ACETAMINOPHEN 325 MG PO TABS
650.0000 mg | ORAL_TABLET | ORAL | Status: AC | PRN
Start: 2020-04-12 — End: ?

## 2020-04-12 MED ORDER — CLOPIDOGREL BISULFATE 75 MG PO TABS: 75.0000 mg | ORAL_TABLET | Freq: Every day | ORAL | 0 refills | Status: DC

## 2020-04-12 MED ORDER — ASPIRIN EC 325 MG PO TBEC: 325.0000 mg | DELAYED_RELEASE_TABLET | Freq: Every day | ORAL | 3 refills | Status: DC

## 2020-04-12 MED ORDER — POTASSIUM CHLORIDE CRYS ER 20 MEQ PO TBCR
40.0000 meq | EXTENDED_RELEASE_TABLET | Freq: Once | ORAL | Status: AC
  Administered 2020-04-12: 40 meq via ORAL
  Filled 2020-04-12: qty 2

## 2020-04-12 NOTE — Progress Notes (Signed)
While assisting pt from [facility] to chair patient reported Blurred vision in left eye and light hedadedness, neuro status unchanged, NIH unchanged. MD called and to the bedside.

## 2020-04-12 NOTE — Progress Notes (Signed)
Patient discharge home in her personal scooter. Patient verbalized signs ans symptoms of stroke and call 911. Patient prescription at [facility]. IV removed and cathter tip in place. Patient verbalized understanding of discharge instructions using teach back.

## 2020-04-12 NOTE — Progress Notes (Signed)
Occupational Therapy Evaluation Patient Details Name: Kelsey Joseph MRN: [id number] DOB: [birth year] Today's Date: 04/12/2020    History of Present Illness 87 yo admitted with dizziness and blurred vision s/p tPA with head CT negative. PMhx: SSS s/p pacemaker, incontinent bowel, AFib, HTN, CHF, HLD, CAD, CKD, Rt hip disarticulation, neuropathy, HOH   Clinical Impression   PTA pt modified independent with all mobility and ADL tasks. Pt presents with apparent L field deficit in addition to apparent cognitive deficits with memory as demonstrated by scoring a 6 on the Short Blessed Test (see below). Pt reports feeling "woozy" and "dizzy" with transfers. VSS. Recommend pt have initial 24/7 S and direct assistance with mobility, IADL and ADL tasks. Recommend pt follow up with her eye doctor and have a Full Field Visual Assessment completed. Pt educated on importance of refraining from driving at this time. Recommend follow up with [facility].     Follow Up Recommendations  Home health OT;Supervision/Assistance - 24 hour (follow up with Eye Doctor for Full Field Visual Assessment; Refrain from driving; direct S with all medication and financial management)    Equipment Recommendations  None recommended by OT    Recommendations for Other Services       Precautions / Restrictions Precautions Precautions: Fall      Mobility Bed Mobility               General bed mobility comments: OOB in chair    Transfers Overall transfer level: Needs assistance         Squat pivot transfers: Min guard     General transfer comment: "I just feel weak".    Balance     Sitting balance-Leahy Scale: Good                                     ADL either performed or assessed with clinical judgement   ADL Overall ADL's : Needs assistance/impaired                                     Functional mobility during ADLs: Min guard (squat pivot) General ADL Comments:  minguard for LB ADLa dn set up for UB ADL     Vision Baseline Vision/History: Wears glasses Wears Glasses: Reading only Patient Visual Report: Peripheral vision impairment Vision Assessment?: Yes Eye Alignment: Within Functional Limits Ocular Range of Motion: Within Functional Limits Alignment/Gaze Preference: Within Defined Limits Tracking/Visual Pursuits: Decreased smoothness of horizontal tracking;Decreased smoothness of vertical tracking Saccades: Decreased speed of saccadic movement;Impaired - to be further tested in functional context Visual Fields: Left visual field deficit Additional Comments: Upper quadrant appears more impaired than lower quadrant; pt appears to have intact central vision; REports part of therapist's face is "dark"     Perception     Praxis      Pertinent Vitals/Pain Pain Assessment: Faces Faces Pain Scale: Hurts a little bit Pain Location: neck; Head Pain Descriptors / Indicators: Aching Pain Intervention(s): Limited activity within patient's tolerance     Hand Dominance Right   Extremity/Trunk Assessment Upper Extremity Assessment Upper Extremity Assessment: Generalized weakness (hx of B shoulder pain)   Lower Extremity Assessment Lower Extremity Assessment: Defer to PT evaluation   Cervical / Trunk Assessment Cervical / Trunk Assessment: Kyphotic   Communication Communication Communication: No difficulties   Cognition Arousal/Alertness: Awake/alert Behavior During  Therapy: WFL for tasks assessed/performed Overall Cognitive Status: Impaired/Different from baseline Area of Impairment: Memory;Awareness;Attention                   Current Attention Level: Selective Memory: Decreased short-term memory   Safety/Judgement: Decreased awareness of safety Awareness: Emergent Problem Solving: Slow processing General Comments: Scored a 6 on the Short Blessed Test, noting increased difficulty with STM. Pt states "my memory is usually  really good"   General Comments  son present    Exercises     Shoulder Instructions      Home Living Family/patient expects to be discharged to:: Private residence Living Arrangements: Alone Available Help at Discharge: Family;Personal care attendant;Available PRN/intermittently Type of Home: House Home Access: Level entry     Home Layout: One level     Bathroom Shower/Tub: Occupational psychologist: Handicapped height Bathroom Accessibility: Yes How Accessible: Accessible via wheelchair Home Equipment: Transport planner;Wheelchair - manual;Shower seat - built in;Shower seat          Prior Functioning/Environment Level of Independence: Independent with assistive device(s)                 OT Problem List: Decreased strength;Decreased activity tolerance;Impaired balance (sitting and/or standing);Impaired vision/perception;Decreased cognition;Decreased safety awareness;Pain      OT Treatment/Interventions:      OT Goals(Current goals can be found in the care plan section) Acute Rehab OT Goals Patient Stated Goal: return home to my dog OT Goal Formulation: All assessment and education complete, DC therapy  OT Frequency:     Barriers to D/C:            Co-evaluation              AM-PAC OT "6 Clicks" Daily Activity     Outcome Measure Help from another person eating meals?: None Help from another person taking care of personal grooming?: A Little Help from another person toileting, which includes using toliet, bedpan, or urinal?: A Little Help from another person bathing (including washing, rinsing, drying)?: A Little Help from another person to put on and taking off regular upper body clothing?: A Little Help from another person to put on and taking off regular lower body clothing?: A Little 6 Click Score: 19   End of Session Nurse Communication: Mobility status;Other (comment) (DC needs)  Activity Tolerance: Patient tolerated treatment  well Patient left: in chair;with call bell/phone within reach;with chair alarm set;with family/visitor present  OT Visit Diagnosis: Muscle weakness (generalized) (M62.81);Low vision, both eyes (H54.2);Other symptoms and signs involving cognitive function                Time: 1350-1430 OT Time Calculation (min): 40 min Charges:  OT General Charges $OT Visit: 1 Visit OT Evaluation $OT Eval Moderate Complexity: 1 Mod OT Treatments $Self Care/Home Management : 8-22 mins $Therapeutic Activity: 8-22 mins  Maurie Boettcher, OT/L   Acute OT Clinical Specialist Acute Rehabilitation Services Pager [phone] Office [phone]   [facility] 04/12/2020, 3:41 PM

## 2020-04-12 NOTE — Progress Notes (Signed)
STROKE TEAM PROGRESS NOTE   INTERVAL HISTORY  This morning she is reporting resolution of her blurred vision. She describes visual loss as upper areas of vision but unsure if unilateral or bilateral. Mild to moderate associated headache which appears to resolve. She denies any prior history of recurrent headaches vision loss. She denies any history of scalp claudication, jaw pain or myalgias or arthralgias. Blood pressure is adequately controlled.  Vitals:   04/12/20 1102 04/12/20 1103 04/12/20 1104 04/12/20 1105  BP:    (!) 143/122  Pulse: (!) 102 (!) 102 (!) 104 99  Resp: '14 15 19 ' (!) 22  Temp:    97.6 F (36.4 C)  TempSrc:    Oral  SpO2: 96% 95% 97% 97%  Weight:      Height:       CBC:  Recent Labs  Lab 04/11/20 0225 04/11/20 0234 04/11/20 0442  WBC 6.9  --  5.9  NEUTROABS 3.9  --   --   HGB 12.8 12.9 14.0  HCT 39.5 38.0 44.5  MCV 89.8  --  90.6  PLT 190  --  416   Basic Metabolic Panel:  Recent Labs  Lab 04/11/20 0225 04/11/20 0234 04/12/20 0602  NA 141 141 141  K 4.4 3.4* 3.1*  CL 102 104 104  CO2 25  --  26  GLUCOSE 113* 113* 140*  BUN 29* 29* 19  CREATININE 1.04* 1.00 0.97  CALCIUM 8.9  --  8.7*   Lipid Panel:  Recent Labs  Lab 04/11/20 0443  CHOL 133  TRIG 97  HDL 56  CHOLHDL 2.4  VLDL 19  LDLCALC 58   HgbA1c:  Recent Labs  Lab 04/11/20 0443  HGBA1C 7.0*   Urine Drug Screen:  Recent Labs  Lab 04/11/20 0952  LABOPIA NONE DETECTED  COCAINSCRNUR NONE DETECTED  LABBENZ NONE DETECTED  AMPHETMU NONE DETECTED  THCU NONE DETECTED  LABBARB NONE DETECTED    Alcohol Level  Recent Labs  Lab 04/11/20 0225  ETH <10    IMAGING past 24 hours CT HEAD WO CONTRAST  Result Date: 04/12/2020 CLINICAL DATA:  Follow-up examination for acute stroke. EXAM: CT HEAD WITHOUT CONTRAST TECHNIQUE: Contiguous axial images were obtained from the base of the skull through the vertex without intravenous contrast. COMPARISON:  Prior studies from 04/11/2020.  FINDINGS: Brain: Age-related cerebral atrophy with chronic small vessel ischemic disease again noted. No visible acute or evolving large vessel territory infarct. No acute intracranial hemorrhage. No mass lesion or mass effect. No hydrocephalus or midline shift. No extra-axial fluid collection. Vascular: No visible hyperdense vessel. Calcified atherosclerosis present at the skull base. Skull: Stable.  No new or acute abnormality. Sinuses/Orbits: Globes and orbital soft tissues remain within normal limits. Paranasal sinuses remain largely clear. Trace right mastoid effusion noted, of doubtful significance. Other: None. IMPRESSION: 1. Stable head CT. No visible acute or evolving ischemic infarct identified. No other new acute intracranial abnormality. 2. Stable atrophy with chronic small vessel ischemic disease. Electronically Signed   By: Jeannine Boga M.D.   On: 04/12/2020 05:01   ECHOCARDIOGRAM COMPLETE  Result Date: 04/11/2020    ECHOCARDIOGRAM REPORT   Patient Name:   Kelsey Joseph Date of Exam: 04/11/2020 Medical Rec #:  [id number]      Height:       63.0 in Accession #:    [id number]     Weight:       120.8 lb Date of Birth:  08-10-1927  BSA:          1.561 m Patient Age:    87 years       BP:           92/56 mmHg Patient Gender: F              HR:           91 bpm. Exam Location:  Inpatient Procedure: 2D Echo, Color Doppler and Cardiac Doppler Indications:    Stroke i163.9  History:        Patient has prior history of Echocardiogram examinations, most                 recent 02/09/2018. Pacemaker, Arrythmias:Atrial Fibrillation;                 Risk Factors:Hypertension, Diabetes, Dyslipidemia and Sleep                 Apnea.  Sonographer:    Raquel Sarna Senior RDCS Referring Phys: [phone] MCNEILL P Schoolcraft  1. He patient is in atrial fibrillation.  2. Left ventricular ejection fraction, by estimation, is 65 to 70%. The left ventricle has normal function. The left ventricle has no regional  wall motion abnormalities. There is mild concentric left ventricular hypertrophy. Left ventricular diastolic function could not be evaluated.  3. Right ventricular systolic function is normal. The right ventricular size is normal. There is mildly elevated pulmonary artery systolic pressure. The estimated right ventricular systolic pressure is 90.2 mmHg.  4. The mitral valve is normal in structure. No evidence of mitral valve regurgitation. No evidence of mitral stenosis.  5. Tricuspid valve regurgitation is moderate.  6. The aortic valve is normal in structure. Aortic valve regurgitation is trivial. Mild to moderate aortic valve sclerosis/calcification is present, without any evidence of aortic stenosis.  7. The inferior vena cava is normal in size with greater than 50% respiratory variability, suggesting right atrial pressure of 3 mmHg. Conclusion(s)/Recommendation(s): No intracardiac source of embolism detected on this transthoracic study. A transesophageal echocardiogram is recommended to exclude cardiac source of embolism if clinically indicated. FINDINGS  Left Ventricle: Left ventricular ejection fraction, by estimation, is 65 to 70%. The left ventricle has normal function. The left ventricle has no regional wall motion abnormalities. The left ventricular internal cavity size was normal in size. There is  mild concentric left ventricular hypertrophy. Left ventricular diastolic function could not be evaluated due to atrial fibrillation. Left ventricular diastolic function could not be evaluated. Right Ventricle: The right ventricular size is normal. No increase in right ventricular wall thickness. Right ventricular systolic function is normal. There is mildly elevated pulmonary artery systolic pressure. The tricuspid regurgitant velocity is 3.04  m/s, and with an assumed right atrial pressure of 3 mmHg, the estimated right ventricular systolic pressure is 40.9 mmHg. Left Atrium: Left atrial size was normal in  size. Right Atrium: Right atrial size was normal in size. Pericardium: There is no evidence of pericardial effusion. Mitral Valve: The mitral valve is normal in structure. No evidence of mitral valve regurgitation. No evidence of mitral valve stenosis. Tricuspid Valve: The tricuspid valve is normal in structure. Tricuspid valve regurgitation is moderate . No evidence of tricuspid stenosis. Aortic Valve: The aortic valve is normal in structure. Aortic valve regurgitation is trivial. Mild to moderate aortic valve sclerosis/calcification is present, without any evidence of aortic stenosis. Pulmonic Valve: The pulmonic valve was normal in structure. Pulmonic valve regurgitation is not visualized. No evidence of pulmonic  stenosis. Aorta: The aortic root is normal in size and structure. Venous: The inferior vena cava is normal in size with greater than 50% respiratory variability, suggesting right atrial pressure of 3 mmHg. IAS/Shunts: No atrial level shunt detected by color flow Doppler. Additional Comments: A pacer wire is visualized in the right atrium and right ventricle.  LEFT VENTRICLE PLAX 2D LVIDd:         3.40 cm LVIDs:         2.20 cm LV PW:         1.20 cm LV IVS:        1.10 cm LVOT diam:     1.55 cm LV SV:         24 LV SV Index:   16 LVOT Area:     1.89 cm  RIGHT VENTRICLE RV S prime:     10.20 cm/s TAPSE (M-mode): 1.5 cm LEFT ATRIUM             Index       RIGHT ATRIUM           Index LA diam:        3.40 cm 2.18 cm/m  RA Area:     14.60 cm LA Vol (A2C):   57.8 ml 37.03 ml/m RA Volume:   33.00 ml  21.14 ml/m LA Vol (A4C):   51.9 ml 33.25 ml/m LA Biplane Vol: 57.0 ml 36.52 ml/m  AORTIC VALVE LVOT Vmax:   89.45 cm/s LVOT Vmean:  54.700 cm/s LVOT VTI:    0.128 m  AORTA Ao Root diam: 2.80 cm Ao Asc diam:  3.00 cm TRICUSPID VALVE TR Peak grad:   37.0 mmHg TR Vmax:        304.00 cm/s  SHUNTS Systemic VTI:  0.13 m Systemic Diam: 1.55 cm Ena Dawley MD Electronically signed by Ena Dawley MD  Signature Date/Time: 04/11/2020/7:53:28 PM    Final    PHYSICAL EXAM Frail elderly Caucasian lady not in distress. . Afebrile. Head is nontraumatic. Neck is supple without bruit.    Cardiac exam no murmur or gallop. Lungs are clear to auscultation. Distal pulses are well felt. Right leg amputation  Neurological Exam ;  Awake  Alert oriented x 3. Normal speech and language.eye movements full without nystagmus.fundi were not visualized. Vision acuity and fields appear normal. Hearing is normal. Palatal movements are normal. Face symmetric. Tongue midline. Normal strength, tone, reflexes and coordination. Normal sensation. Gait deferred.   ASSESSMENT/PLAN Ms. Kelsey Joseph is a 87 y.o. 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   She presented with sudden onset of dizziness with vision disturbance which she describes as blurred vision in the upper visual fields with uncertain laterality. She is s/p TPA given at 0330 1/6.  Probable strokelike episode treated with IV tPA for left eye partial vision loss due to retinal artery branch occlusion versus ischemic optic neuropathy or temporal arteritis -treated with IV tPA with good resolution of symptoms  Code Stroke CT head No acute abnormality. Age-related cerebral atrophy with chronic microvascular ischemic disease. ASPECTS 10.     CTA head & neck: reveals  a vertebral occlusion as well as distal P3 occlusion  MRI: not a candidate due to pacemaker/defibrillator  Carotid Doppler: not ordered  2D  Echo pending   LDL 58  HgbA1c 7.0  VTE prophylaxis - Holding post TPA x 24 hours     Diet   Diet Heart Room service appropriate? Yes with Assist; Fluid consistency: Thin   ANA, ESR   Therapy recommendations:   Disposition:    Hypertension  Home meds:  Labetolol 169m BID, Verapamil CR 2457mdaily   PRN labetolol and hydralazine ordered  . Permissive hypertension (OK if < 220/120) but gradually normalize in 5-7 days . Long-term BP goal normotensive  Hyperlipidemia  Home meds: Crestor 2064maily, resumed in hospital  LDL 58, goal < 70  Continue statin at discharge  Other Stroke Risk Factors  Advanced Age >/= 65   10 pack year history cigarette smoker who quit 62 6s ago  Coronary artery disease  CHF    Hospital day # 1 Patient complained of transient blurred vision today after she was given a bath and sat up in a chair but that the pain still resolved.  ESR is normal.  Repeat CT scan of the head shows no evidence of acute stroke.  Long discussion with patient and her son at the bedside and answered questions.plan mobilize out of bed.  Therapy consults.  Likely discharge home later today.  Patient has atrial fibrillation but had GI hemorrhage on Eliquis in the past and so we will not restart it instead do aspirin 81 and Plavix 75 mg for 3 weeks followed by aspirin 325 mg daily alone PraAntony ContrasD   To contact Stroke Continuity provider, please refer to [email or web address], contact General Neurology

## 2020-04-12 NOTE — Evaluation (Signed)
Physical Therapy Evaluation Patient Details Name: Kelsey Joseph MRN: [id number] DOB: 07-24-1927 Today's Date: 04/12/2020   History of Present Illness  87 yo admitted with dizziness and blurred vision s/p tPA with head CT negative. PMhx: SSS s/p pacemaker, incontinent bowel, AFib, HTN, CHF, HLD, CAD, CKD, Rt hip disarticulation, neuropathy, HOH  Clinical Impression  Pt pleasant and reports she lives alone with dog and that her daughter has hired an Engineer, production but that the aide has been out and only present at work 1 day so far. Pt normally moves around in home in [facility] and in community with scooter. Pt reports no blurred vision, dizziness, weakness or other neuro symptoms this session. Pt reports right hip pain limiting mobility and demonstrates decreased ability to perform ADLs and transfers with guarding. Pt with decreased activity tolerance and function from baseline who will benefit from acute therapy to maximize mobility, safety and independence to decrease burden of care. Initial assist at home until return to mod I recommended.    Pre BP 146/63 Post 129/100 HR 123 93% RA   Follow Up Recommendations Home health PT;Supervision/Assistance - 24 hour (initial 24hr assist until return to mod I)    Equipment Recommendations  None recommended by PT    Recommendations for Other Services OT consult     Precautions / Restrictions Precautions Precautions: Fall Precaution Comments: right hip disarticulation. permissive HTn <220/120      Mobility  Bed Mobility Overal bed mobility: Needs Assistance Bed Mobility: Supine to Sit     Supine to sit: Min guard;HOB elevated     General bed mobility comments: HOB 25 degrees with use of rail, increased time, guarding for lines and safety    Transfers Overall transfer level: Needs assistance   Transfers: Sit to/from Stand;Stand Pivot Transfers;Squat Pivot Transfers Sit to Stand: Min guard Stand pivot transfers: Min guard Squat pivot transfers: Min  guard     General transfer comment: guarding for standing from bed with stand pivot to [facility] toward left with pt able to manage clothing from sitting and perform pericare. Pt required assist to don clothing in standing then sat again with squat pivot from [facility] to recliner with guarding for safety  Ambulation/Gait             General Gait Details: non ambulatory at baseline  Stairs            Wheelchair Mobility    Modified Rankin (Stroke Patients Only) Modified Rankin (Stroke Patients Only) Pre-Morbid Rankin Score: Moderately severe disability Modified Rankin: Moderately severe disability     Balance Overall balance assessment: Needs assistance   Sitting balance-Leahy Scale: Fair Sitting balance - Comments: EOb without assist     Standing balance-Leahy Scale: Zero Standing balance comment: bil UE support in standing                             Pertinent Vitals/Pain Pain Assessment: Faces Pain Score: 8  Pain Location: right hip Pain Descriptors / Indicators: Aching;Guarding;Constant Pain Intervention(s): Limited activity within patient's tolerance;Monitored during session;Repositioned;Patient requesting pain meds-RN notified    Home Living Family/patient expects to be discharged to:: Private residence Living Arrangements: Alone Available Help at Discharge: Family;Personal care attendant;Available PRN/intermittently Type of Home: House Home Access: Level entry     Home Layout: One level Home Equipment: Transport planner;Wheelchair - manual;Shower seat - built in;Shower seat      Prior Function Level of Independence: Independent with assistive device(s)  Comments: pivots to electric scooter, takes her dog for walks. Uses WC in the house and scooter outside. Drives. Has an aide 5days/wk but has only come 1x     Hand Dominance        Extremity/Trunk Assessment   Upper Extremity Assessment Upper Extremity Assessment: Overall WFL for  tasks assessed    Lower Extremity Assessment Lower Extremity Assessment: RLE deficits/detail;LLE deficits/detail RLE Deficits / Details: hip disarticulation LLE Deficits / Details: grossly 3/5 not formally assessed    Cervical / Trunk Assessment Cervical / Trunk Assessment: Kyphotic  Communication   Communication: No difficulties  Cognition Arousal/Alertness: Awake/alert Behavior During Therapy: WFL for tasks assessed/performed Overall Cognitive Status: Impaired/Different from baseline Area of Impairment: Memory;Safety/judgement;Problem solving                     Memory: Decreased short-term memory   Safety/Judgement: Decreased awareness of deficits   Problem Solving: Requires verbal cues General Comments: pt able to provide home setup and PLOF. Pt with decreased problem solving and management for clothing and self care with toileting. Pt with difficulty instructing/approving room setup for transfers similar to home      General Comments      Exercises     Assessment/Plan    PT Assessment Patient needs continued PT services  PT Problem List Decreased mobility;Decreased activity tolerance;Decreased cognition;Decreased balance       PT Treatment Interventions DME instruction;Functional mobility training;Therapeutic activities;Patient/family education;Cognitive remediation;Neuromuscular re-education;Therapeutic exercise    PT Goals (Current goals can be found in the Care Plan section)  Acute Rehab PT Goals Patient Stated Goal: return home to my dog PT Goal Formulation: With patient Time For Goal Achievement: 04/26/20 Potential to Achieve Goals: Good    Frequency Min 3X/week   Barriers to discharge Decreased caregiver support      Co-evaluation               AM-PAC PT "6 Clicks" Mobility  Outcome Measure Help needed turning from your back to your side while in a flat bed without using bedrails?: A Little Help needed moving from lying on your back  to sitting on the side of a flat bed without using bedrails?: A Little Help needed moving to and from a bed to a chair (including a wheelchair)?: A Little Help needed standing up from a chair using your arms (e.g., wheelchair or bedside chair)?: A Little Help needed to walk in hospital room?: Total Help needed climbing 3-5 steps with a railing? : Total 6 Click Score: 14    End of Session   Activity Tolerance: Patient tolerated treatment well Patient left: in chair;with call bell/phone within reach;with chair alarm set Nurse Communication: Mobility status PT Visit Diagnosis: Other abnormalities of gait and mobility (R26.89);Muscle weakness (generalized) (M62.81)    Time: 2694-8546 PT Time Calculation (min) (ACUTE ONLY): 28 min   Charges:   PT Evaluation $PT Eval Moderate Complexity: 1 Mod PT Treatments $Therapeutic Activity: 8-22 mins        Toya Palacios P, PT Acute Rehabilitation Services Pager: [phone] Office: [phone]   Sayre Mazor B Christyne Mccain 04/12/2020, 11:23 AM

## 2020-04-12 NOTE — Discharge Summary (Addendum)
Stroke Discharge Summary  Patient ID: Kelsey Joseph   MRN: [id number]      DOB: 08-07-1927  Date of Admission: 04/11/2020 Date of Discharge: 04/12/2020  Attending Physician:  Greta Doom, *, Stroke MD Consultant(s):     None   Patient's PCP:  [name], MD  DISCHARGE DIAGNOSIS:  1.  Probable strokelike episode treated with IV tPA for left eye partial vision loss due to retinal artery branch occlusion versus ischemic optic neuropathy or temporal arteritis  2. Atrial fibrillation  3. History of long term anticoagulation (Eliquis) discontinuation due to GI bleed and anemia  Active Problems:   Stroke (cerebrum) (HCC)    Allergies as of 04/12/2020       Reactions   Latex Other (See Comments)   Patient states only "latex bandages" causes blisters   Sulfa Antibiotics Diarrhea   Severe diarrhea   Adhesive [tape] Other (See Comments)   Blisters, when left on for "a while."        Medication List     STOP taking these medications    meloxicam 15 MG tablet Commonly known as: MOBIC       TAKE these medications    acetaminophen 325 MG tablet Commonly known as: TYLENOL Take 2 tablets (650 mg total) by mouth every 4 (four) hours as needed for mild pain (or temp > 37.5 C (99.5 F)). Do not exceed 3,500 mg of acetaminophen daily   aspirin EC 81 MG tablet Take 1 tablet (81 mg total) by mouth daily for 21 days. What changed: Another medication with the same name was added. Make sure you understand how and when to take each.   aspirin EC 325 MG tablet Take 1 tablet (325 mg total) by mouth daily. DO NOT START Until Jan 29th Start taking on: May 04, 2020 What changed: You were already taking a medication with the same name, and this prescription was added. Make sure you understand how and when to take each.   BIOTIN PO Take 1 tablet by mouth daily.   clopidogrel 75 MG tablet Commonly known as: Plavix Take 1 tablet (75 mg total) by mouth daily. Take from  04/12/20-05/03/20 For stroke prevention   CoQ10 100 MG Caps Take 300 mg by mouth daily. What changed: how much to take   estradiol 0.5 MG tablet Commonly known as: ESTRACE Take 0.5 mg by mouth at bedtime.   fluticasone 50 MCG/ACT nasal spray Commonly known as: FLONASE Place 1 spray into both nostrils daily as needed for allergies.   hydrocortisone 1 % lotion Apply 1 application topically as needed. Apply on the itchy rashes on the back   LORazepam 0.5 MG tablet Commonly known as: ATIVAN Take 1 tablet (0.5 mg total) by mouth as needed for anxiety. What changed: reasons to take this   metoprolol tartrate 100 MG tablet Commonly known as: LOPRESSOR TAKE 1 TABLET TWICE A DAY   montelukast 10 MG tablet Commonly known as: SINGULAIR Take 10 mg by mouth at bedtime.   Myrbetriq 25 MG Tb24 tablet Generic drug: mirabegron ER Take 25 mg by mouth daily.   nabumetone 500 MG tablet Commonly known as: RELAFEN Take 500 mg by mouth at bedtime.   nitroGLYCERIN 0.4 MG SL tablet Commonly known as: NITROSTAT Place 1 tablet (0.4 mg total) under the tongue every 5 (five) minutes x 3 doses as needed for chest pain.   omeprazole 40 MG capsule Commonly known as: PRILOSEC Take 40 mg by mouth  daily.   pregabalin 50 MG capsule Commonly known as: LYRICA Take 1 capsule (50 mg total) by mouth 3 (three) times daily. Please call to schedule appt or you may request future refills from PCP.   REFRESH OP Place 1 drop into both eyes daily as needed (For dry eyes or irritation.).   rosuvastatin 20 MG tablet Commonly known as: CRESTOR TAKE 1 TABLET DAILY   Synthroid 100 MCG tablet Generic drug: levothyroxine Take 100 mcg by mouth daily.   torsemide 20 MG tablet Commonly known as: DEMADEX Take 1.5 tablets (30 mg total) by mouth daily. For SOB/edema take an additional 10 mg   traMADol 50 MG tablet Commonly known as: ULTRAM Take 50 mg by mouth every 6 (six) hours as needed for moderate pain.    verapamil 240 MG CR tablet Commonly known as: CALAN-SR Take 240 mg daily What changed:  how much to take how to take this when to take this additional instructions      Discharge med rec reviewed with 32M pharmacist prior to discharge.    LABORATORY STUDIES CBC    Component Value Date/Time   WBC 5.9 04/11/2020 0442   RBC 4.91 04/11/2020 0442   HGB 14.0 04/11/2020 0442   HCT 44.5 04/11/2020 0442   PLT 159 04/11/2020 0442   MCV 90.6 04/11/2020 0442   MCV 95.4 06/20/2012 1924   MCH 28.5 04/11/2020 0442   MCHC 31.5 04/11/2020 0442   RDW 14.5 04/11/2020 0442   LYMPHSABS 2.2 04/11/2020 0225   MONOABS 0.6 04/11/2020 0225   EOSABS 0.1 04/11/2020 0225   BASOSABS 0.0 04/11/2020 0225   CMP    Component Value Date/Time   NA 141 04/12/2020 0602   NA 139 04/01/2018 1525   K 3.1 (L) 04/12/2020 0602   CL 104 04/12/2020 0602   CO2 26 04/12/2020 0602   GLUCOSE 140 (H) 04/12/2020 0602   BUN 19 04/12/2020 0602   BUN 41 (H) 04/01/2018 1525   CREATININE 0.97 04/12/2020 0602   CREATININE 0.84 03/13/2014 1616   CALCIUM 8.7 (L) 04/12/2020 0602   PROT 6.5 04/11/2020 0225   ALBUMIN 3.7 04/11/2020 0225   AST 38 04/11/2020 0225   ALT 24 04/11/2020 0225   ALKPHOS 75 04/11/2020 0225   BILITOT 1.0 04/11/2020 0225   GFRNONAA 56 (L) 04/12/2020 0602   GFRAA 51 (L) 10/11/2018 1754   COAGS Lab Results  Component Value Date   INR 1.1 04/11/2020   INR 1.19 10/18/2016   INR 1.34 10/01/2014   Lipid Panel    Component Value Date/Time   CHOL 133 04/11/2020 0443   TRIG 97 04/11/2020 0443   HDL 56 04/11/2020 0443   CHOLHDL 2.4 04/11/2020 0443   VLDL 19 04/11/2020 0443   LDLCALC 58 04/11/2020 0443   HgbA1C  Lab Results  Component Value Date   HGBA1C 7.0 (H) 04/11/2020   Urinalysis    Component Value Date/Time   COLORURINE YELLOW 04/11/2020 0952   APPEARANCEUR HAZY (A) 04/11/2020 0952   LABSPEC 1.040 (H) 04/11/2020 0952   PHURINE 7.0 04/11/2020 0952   GLUCOSEU NEGATIVE  04/11/2020 0952   HGBUR SMALL (A) 04/11/2020 0952   HGBUR negative 12/21/2008 1209   BILIRUBINUR NEGATIVE 04/11/2020 0952   BILIRUBINUR n 12/11/2010 1100   KETONESUR NEGATIVE 04/11/2020 0952   PROTEINUR 30 (A) 04/11/2020 0952   UROBILINOGEN 0.2 07/02/2013 0030   NITRITE NEGATIVE 04/11/2020 0952   LEUKOCYTESUR NEGATIVE 04/11/2020 0952   Urine Drug Screen     Component  Value Date/Time   LABOPIA NONE DETECTED 04/11/2020 0952   COCAINSCRNUR NONE DETECTED 04/11/2020 0952   LABBENZ NONE DETECTED 04/11/2020 0952   AMPHETMU NONE DETECTED 04/11/2020 0952   THCU NONE DETECTED 04/11/2020 0952   LABBARB NONE DETECTED 04/11/2020 0952    Alcohol Level    Component Value Date/Time   ETH <10 04/11/2020 0225     SIGNIFICANT DIAGNOSTIC STUDIES CT Code Stroke CTA Head W/WO contrast  Result Date: 04/11/2020 CLINICAL DATA:  Initial evaluation for acute stroke, blurry vision. EXAM: CT ANGIOGRAPHY HEAD AND NECK TECHNIQUE: Multidetector CT imaging of the head and neck was performed using the standard protocol during bolus administration of intravenous contrast. Multiplanar CT image reconstructions and MIPs were obtained to evaluate the vascular anatomy. Carotid stenosis measurements (when applicable) are obtained utilizing NASCET criteria, using the distal internal carotid diameter as the denominator. CONTRAST:  64m OMNIPAQUE IOHEXOL 350 MG/ML SOLN COMPARISON:  Prior head CT from earlier same day. FINDINGS: CTA NECK FINDINGS Aortic arch: Visualized aortic arch of normal caliber with normal 3 vessel morphology. Moderate atheromatous change about the arch and origin of the great vessels without hemodynamically significant stenosis. Right carotid system: Right CCA patent from its origin to the bifurcation without stenosis. Eccentric calcified plaque at the right bifurcation without significant stenosis. Right ICA widely patent distally without stenosis, dissection or occlusion. Left carotid system: Left CCA  patent from its origin to the bifurcation eccentric calcified plaque at the left bifurcation/proximal left ICA without significant stenosis. Left ICA widely patent distally without stenosis, dissection or occlusion. Vertebral arteries: Both vertebral arteries arise from the subclavian arteries. No proximal subclavian artery stenosis. Atheromatous plaque at the origins of both vertebral arteries with associated moderate to severe ostial stenosis. Right vertebral artery otherwise widely patent within the neck. Diffusely attenuated flow seen within the left V1 and V2 segments. There is fairly abrupt occlusion of the distal left vertebral artery just proximal to the V2/V3 junction, suspected to reflect an acute dissection (series 7, image 3026). Small irregular filling defect at this level consistent with intraluminal thrombus. Left vertebral otherwise remains occluded to the cranial vault. Skeleton: No visible acute osseous abnormality. No discrete or worrisome osseous lesions. Moderate cervical spondylosis at C5-6 and C6-7 without high-grade stenosis. Other neck: No other acute soft tissue abnormality within the neck. No mass or adenopathy. Upper chest: Enlarged 1.4 cm precarinal node, indeterminate, but could be reactive. Additional mildly enlarged 1.1 cm prevascular node. Diffuse ground-glass opacity with interlobular septal thickening seen within the visualized lungs, likely reflecting pulmonary edema. Left-sided pacemaker/AICD noted. Review of the MIP images confirms the above findings CTA HEAD FINDINGS Anterior circulation: Petrous segments patent bilaterally. Scattered atheromatous plaque within the carotid siphons with associated mild to moderate narrowing, left worse than right. A1 segments patent bilaterally. Normal anterior communicating artery complex. Anterior cerebral arteries widely patent to their distal aspects. M1 segments mildly irregular but are widely patent without stenosis. Normal MCA  bifurcations. Distal MCA branches well perfused and symmetric. Posterior circulation: Right V4 segment widely patent to the vertebrobasilar junction. Right PICA origin patent and normal. Left vertebral artery occluded as it courses into the cranial vault. Opacification of the distal left V4 segment with perfusion of the left PICA, likely retrograde in nature across the vertebrobasilar junction. Basilar patent distally without stenosis or other abnormality. Superior cerebellar arteries patent bilaterally. Right PCA supplied via the basilar. Fetal type origin of the left PCA supplied via a robust left posterior communicating artery. Left PCA widely  patent to its distal aspect. On the right, there is abrupt occlusion of a distal right P3 branch, presumably embolic from the left vertebral dissection (series 9, image 150). Venous sinuses: Patent allowing for timing the contrast bolus. Anatomic variants: Fetal type origin of the left PCA.  No aneurysm. Review of the MIP images confirms the above findings IMPRESSION: 1. Abrupt occlusion of the distal left vertebral artery just proximal to the V2/V3 junction, likely reflecting an acute dissection. Left vertebral artery a remains occluded to the cranial vault. Perfusion of the distal left V4 segment and left PICA likely retrograde in nature across the vertebrobasilar junction. 2. Acute distal right P3 occlusion, presumably embolic in nature. Right PCA supplied via the basilar, with a fetal type origin left PCA. 3. Atheromatous plaque at the origins of both vertebral arteries with associated moderate to severe ostial stenosis. 4. Diffuse ground-glass opacity with interlobular septal thickening within the visualized lungs, likely reflecting pulmonary edema. 5. Enlarged mediastinal adenopathy, indeterminate, but could be reactive. Critical Value/emergent results were called by telephone at the time of interpretation on 04/11/2020 at 3:00 am to provider MCNEILL [facility] , who  verbally acknowledged these results. Electronically Signed   By: Jeannine Boga M.D.   On: 04/11/2020 03:40   CT HEAD WO CONTRAST  Result Date: 04/12/2020 CLINICAL DATA:  Follow-up examination for acute stroke. EXAM: CT HEAD WITHOUT CONTRAST TECHNIQUE: Contiguous axial images were obtained from the base of the skull through the vertex without intravenous contrast. COMPARISON:  Prior studies from 04/11/2020. FINDINGS: Brain: Age-related cerebral atrophy with chronic small vessel ischemic disease again noted. No visible acute or evolving large vessel territory infarct. No acute intracranial hemorrhage. No mass lesion or mass effect. No hydrocephalus or midline shift. No extra-axial fluid collection. Vascular: No visible hyperdense vessel. Calcified atherosclerosis present at the skull base. Skull: Stable.  No new or acute abnormality. Sinuses/Orbits: Globes and orbital soft tissues remain within normal limits. Paranasal sinuses remain largely clear. Trace right mastoid effusion noted, of doubtful significance. Other: None. IMPRESSION: 1. Stable head CT. No visible acute or evolving ischemic infarct identified. No other new acute intracranial abnormality. 2. Stable atrophy with chronic small vessel ischemic disease. Electronically Signed   By: Jeannine Boga M.D.   On: 04/12/2020 05:01   CT Code Stroke CTA Neck W/WO contrast  Result Date: 04/11/2020 CLINICAL DATA:  Initial evaluation for acute stroke, blurry vision. EXAM: CT ANGIOGRAPHY HEAD AND NECK TECHNIQUE: Multidetector CT imaging of the head and neck was performed using the standard protocol during bolus administration of intravenous contrast. Multiplanar CT image reconstructions and MIPs were obtained to evaluate the vascular anatomy. Carotid stenosis measurements (when applicable) are obtained utilizing NASCET criteria, using the distal internal carotid diameter as the denominator. CONTRAST:  65m OMNIPAQUE IOHEXOL 350 MG/ML SOLN COMPARISON:   Prior head CT from earlier same day. FINDINGS: CTA NECK FINDINGS Aortic arch: Visualized aortic arch of normal caliber with normal 3 vessel morphology. Moderate atheromatous change about the arch and origin of the great vessels without hemodynamically significant stenosis. Right carotid system: Right CCA patent from its origin to the bifurcation without stenosis. Eccentric calcified plaque at the right bifurcation without significant stenosis. Right ICA widely patent distally without stenosis, dissection or occlusion. Left carotid system: Left CCA patent from its origin to the bifurcation eccentric calcified plaque at the left bifurcation/proximal left ICA without significant stenosis. Left ICA widely patent distally without stenosis, dissection or occlusion. Vertebral arteries: Both vertebral arteries arise from the subclavian  arteries. No proximal subclavian artery stenosis. Atheromatous plaque at the origins of both vertebral arteries with associated moderate to severe ostial stenosis. Right vertebral artery otherwise widely patent within the neck. Diffusely attenuated flow seen within the left V1 and V2 segments. There is fairly abrupt occlusion of the distal left vertebral artery just proximal to the V2/V3 junction, suspected to reflect an acute dissection (series 7, image 3026). Small irregular filling defect at this level consistent with intraluminal thrombus. Left vertebral otherwise remains occluded to the cranial vault. Skeleton: No visible acute osseous abnormality. No discrete or worrisome osseous lesions. Moderate cervical spondylosis at C5-6 and C6-7 without high-grade stenosis. Other neck: No other acute soft tissue abnormality within the neck. No mass or adenopathy. Upper chest: Enlarged 1.4 cm precarinal node, indeterminate, but could be reactive. Additional mildly enlarged 1.1 cm prevascular node. Diffuse ground-glass opacity with interlobular septal thickening seen within the visualized lungs,  likely reflecting pulmonary edema. Left-sided pacemaker/AICD noted. Review of the MIP images confirms the above findings CTA HEAD FINDINGS Anterior circulation: Petrous segments patent bilaterally. Scattered atheromatous plaque within the carotid siphons with associated mild to moderate narrowing, left worse than right. A1 segments patent bilaterally. Normal anterior communicating artery complex. Anterior cerebral arteries widely patent to their distal aspects. M1 segments mildly irregular but are widely patent without stenosis. Normal MCA bifurcations. Distal MCA branches well perfused and symmetric. Posterior circulation: Right V4 segment widely patent to the vertebrobasilar junction. Right PICA origin patent and normal. Left vertebral artery occluded as it courses into the cranial vault. Opacification of the distal left V4 segment with perfusion of the left PICA, likely retrograde in nature across the vertebrobasilar junction. Basilar patent distally without stenosis or other abnormality. Superior cerebellar arteries patent bilaterally. Right PCA supplied via the basilar. Fetal type origin of the left PCA supplied via a robust left posterior communicating artery. Left PCA widely patent to its distal aspect. On the right, there is abrupt occlusion of a distal right P3 branch, presumably embolic from the left vertebral dissection (series 9, image 150). Venous sinuses: Patent allowing for timing the contrast bolus. Anatomic variants: Fetal type origin of the left PCA.  No aneurysm. Review of the MIP images confirms the above findings IMPRESSION: 1. Abrupt occlusion of the distal left vertebral artery just proximal to the V2/V3 junction, likely reflecting an acute dissection. Left vertebral artery a remains occluded to the cranial vault. Perfusion of the distal left V4 segment and left PICA likely retrograde in nature across the vertebrobasilar junction. 2. Acute distal right P3 occlusion, presumably embolic in  nature. Right PCA supplied via the basilar, with a fetal type origin left PCA. 3. Atheromatous plaque at the origins of both vertebral arteries with associated moderate to severe ostial stenosis. 4. Diffuse ground-glass opacity with interlobular septal thickening within the visualized lungs, likely reflecting pulmonary edema. 5. Enlarged mediastinal adenopathy, indeterminate, but could be reactive. Critical Value/emergent results were called by telephone at the time of interpretation on 04/11/2020 at 3:00 am to provider MCNEILL [facility] , who verbally acknowledged these results. Electronically Signed   By: Jeannine Boga M.D.   On: 04/11/2020 03:40   ECHOCARDIOGRAM COMPLETE  Result Date: 04/11/2020    ECHOCARDIOGRAM REPORT   Patient Name:   Kelsey Joseph Date of Exam: 04/11/2020 Medical Rec #:  [id number]      Height:       63.0 in Accession #:    [id number]     Weight:  120.8 lb Date of Birth:  1928/02/13       BSA:          1.561 m Patient Age:    89 years       BP:           92/56 mmHg Patient Gender: F              HR:           91 bpm. Exam Location:  Inpatient Procedure: 2D Echo, Color Doppler and Cardiac Doppler Indications:    Stroke i163.9  History:        Patient has prior history of Echocardiogram examinations, most                 recent 02/09/2018. Pacemaker, Arrythmias:Atrial Fibrillation;                 Risk Factors:Hypertension, Diabetes, Dyslipidemia and Sleep                 Apnea.  Sonographer:    Raquel Sarna Senior RDCS Referring Phys: [id number] MCNEILL P [place]  1. He patient is in atrial fibrillation.  2. Left ventricular ejection fraction, by estimation, is 65 to 70%. The left ventricle has normal function. The left ventricle has no regional wall motion abnormalities. There is mild concentric left ventricular hypertrophy. Left ventricular diastolic function could not be evaluated.  3. Right ventricular systolic function is normal. The right ventricular size is normal. There  is mildly elevated pulmonary artery systolic pressure. The estimated right ventricular systolic pressure is 30.8 mmHg.  4. The mitral valve is normal in structure. No evidence of mitral valve regurgitation. No evidence of mitral stenosis.  5. Tricuspid valve regurgitation is moderate.  6. The aortic valve is normal in structure. Aortic valve regurgitation is trivial. Mild to moderate aortic valve sclerosis/calcification is present, without any evidence of aortic stenosis.  7. The inferior vena cava is normal in size with greater than 50% respiratory variability, suggesting right atrial pressure of 3 mmHg. Conclusion(s)/Recommendation(s): No intracardiac source of embolism detected on this transthoracic study. A transesophageal echocardiogram is recommended to exclude cardiac source of embolism if clinically indicated. FINDINGS  Left Ventricle: Left ventricular ejection fraction, by estimation, is 65 to 70%. The left ventricle has normal function. The left ventricle has no regional wall motion abnormalities. The left ventricular internal cavity size was normal in size. There is  mild concentric left ventricular hypertrophy. Left ventricular diastolic function could not be evaluated due to atrial fibrillation. Left ventricular diastolic function could not be evaluated. Right Ventricle: The right ventricular size is normal. No increase in right ventricular wall thickness. Right ventricular systolic function is normal. There is mildly elevated pulmonary artery systolic pressure. The tricuspid regurgitant velocity is 3.04  m/s, and with an assumed right atrial pressure of 3 mmHg, the estimated right ventricular systolic pressure is 65.7 mmHg. Left Atrium: Left atrial size was normal in size. Right Atrium: Right atrial size was normal in size. Pericardium: There is no evidence of pericardial effusion. Mitral Valve: The mitral valve is normal in structure. No evidence of mitral valve regurgitation. No evidence of mitral  valve stenosis. Tricuspid Valve: The tricuspid valve is normal in structure. Tricuspid valve regurgitation is moderate . No evidence of tricuspid stenosis. Aortic Valve: The aortic valve is normal in structure. Aortic valve regurgitation is trivial. Mild to moderate aortic valve sclerosis/calcification is present, without any evidence of aortic stenosis. Pulmonic Valve: The pulmonic valve was  normal in structure. Pulmonic valve regurgitation is not visualized. No evidence of pulmonic stenosis. Aorta: The aortic root is normal in size and structure. Venous: The inferior vena cava is normal in size with greater than 50% respiratory variability, suggesting right atrial pressure of 3 mmHg. IAS/Shunts: No atrial level shunt detected by color flow Doppler. Additional Comments: A pacer wire is visualized in the right atrium and right ventricle.  LEFT VENTRICLE PLAX 2D LVIDd:         3.40 cm LVIDs:         2.20 cm LV PW:         1.20 cm LV IVS:        1.10 cm LVOT diam:     1.55 cm LV SV:         24 LV SV Index:   16 LVOT Area:     1.89 cm  RIGHT VENTRICLE RV S prime:     10.20 cm/s TAPSE (M-mode): 1.5 cm LEFT ATRIUM             Index       RIGHT ATRIUM           Index LA diam:        3.40 cm 2.18 cm/m  RA Area:     14.60 cm LA Vol (A2C):   57.8 ml 37.03 ml/m RA Volume:   33.00 ml  21.14 ml/m LA Vol (A4C):   51.9 ml 33.25 ml/m LA Biplane Vol: 57.0 ml 36.52 ml/m  AORTIC VALVE LVOT Vmax:   89.45 cm/s LVOT Vmean:  54.700 cm/s LVOT VTI:    0.128 m  AORTA Ao Root diam: 2.80 cm Ao Asc diam:  3.00 cm TRICUSPID VALVE TR Peak grad:   37.0 mmHg TR Vmax:        304.00 cm/s  SHUNTS Systemic VTI:  0.13 m Systemic Diam: 1.55 cm Ena Dawley MD Electronically signed by Ena Dawley MD Signature Date/Time: 04/11/2020/7:53:28 PM    Final    CT HEAD CODE STROKE WO CONTRAST  Result Date: 04/11/2020 CLINICAL DATA:  Code stroke. Initial evaluation for acute blurry vision, stroke suspected. EXAM: CT HEAD WITHOUT CONTRAST TECHNIQUE:  Contiguous axial images were obtained from the base of the skull through the vertex without intravenous contrast. COMPARISON:  None. FINDINGS: Brain: Age-related cerebral atrophy with moderate chronic microvascular ischemic disease. No acute intracranial hemorrhage. No acute large vessel territory infarct. No mass lesion, midline shift or mass effect. No hydrocephalus or extra-axial fluid collection. Vascular: No hyperdense vessel. Calcified atherosclerosis at the skull base. Skull: Scalp soft tissues and calvarium within normal limits. Sinuses/Orbits: Globes and orbital soft tissues demonstrate no acute finding. Ocular senescent calcifications noted. Paranasal sinuses are largely clear. No significant mastoid effusion. Other: None. ASPECTS [facility] Stroke Program Early CT Score) - Ganglionic level infarction (caudate, lentiform nuclei, internal capsule, insula, M1-M3 cortex): 7 - Supraganglionic infarction (M4-M6 cortex): 3 Total score (0-10 with 10 being normal): 10 IMPRESSION: 1. No acute intracranial infarct or other abnormality. 2. ASPECTS is 10. 3. Age-related cerebral atrophy with chronic microvascular ischemic disease. These results were communicated to Dr. Leonel Ramsay at 2:50 amon 1/6/2022by text page via the [name] messaging system. Electronically Signed   By: Jeannine Boga M.D.   On: 04/11/2020 02:51   HISTORY OF PRESENT ILLNESS Briefly, Kelsey Joseph is a 87 y.o. female with PMH significant for CAD, SSS s/p non-MRI compatible pacemaker and defibrillator in 2015, CKD, Paroxysmal atrial fibrillation and flutter (failed Eliquis d/t GI  bleed and anemia) who presented to the ED with with blurry vision and lightheadedness. She was treated with tPA in the ED and admitted to critical care setting.   [place] was evaluated in the ED and admitted to the ICU for further neurologic monitoring and evaluation. Repeat HCT showed no acute infarct. CTA head & neck showed a vertebral  occlusion as well as distal P3 occlusion. 2D Echo showed EF 65-70%. ESR was mildly elevated at 25. CRP was within normal limits. ANA was negative.  Blood pressure was managed initially on Cleviprex drip then transitioned to her oral home medications.  She continued to experience intermittent dizziness with resolution of blurred vision yesterday. Today she reported visual loss as upper areas of vision but unsure if unilateral or bilateral. She has reported mild to moderate intermittent  headache which has resolved since admission. MR brain was contraindicated due to pacemaker/debrillator placed in 2015. This appeared to be a stroke like episode due to retinal artery branch occlusion versus ischemic optic neuropathy or temporal arteritis.   Physical and occupational therapists were consulted to provide evaluation, treatment and discharge planning. Patient was cleared to return home with 24 hour assistance and ongoing PT/OT. Home  PT/OT and RN for medication supervision were arranged with care management team. Family plans to stay with her to provide 24 hour care.   DISCHARGE EXAM Blood pressure (!) 143/122, pulse 99, temperature 97.6 F (36.4 C), temperature source Oral, resp. rate (!) 22, height 5' 3" (1.6 m), weight 54.8 kg, SpO2 97 %. Patient seen with and examined by Dr. Leonie Man Awake  Alert oriented x 3. Normal speech and language.eye movements full without nystagmus.fundi were not visualized. Vision acuity and fields appear normal. Hearing is normal. Palatal movements are normal. Face symmetric. Tongue midline. Normal strength, tone, reflexes and coordination. Normal sensation. Gait deferred (RLE amputee)   Discharge Diet       Diet   Diet Heart Room service appropriate? Yes with Assist; Fluid consistency: Thin   liquids  DISCHARGE PLAN Disposition:  Home with home health PT/OT and medication RN services Plan for ASA 67m and Plavix 75 mg x 3 weeks (ending Jan 28th) then ASA 325 mg alone   Ongoing stroke risk factor control by Primary Care Physician at time of discharge Follow-up PCP PDeland Pretty MD in 2 weeks. [facility], office to schedule an appointment.   40 minutes were spent preparing discharge. Delila A Bailey-Modzik, NP-C I have personally obtained history,examined this patient, reviewed notes, independently viewed imaging studies, participated in medical decision making and plan of care.ROS completed by me personally and pertinent positives fully documented  I have made any additions or clarifications directly to the above note. Agree with note above.    PAntony Contras MD Medical Director [facility] Pager: [phone]/01/2021 10:06 AM

## 2020-04-12 NOTE — Progress Notes (Signed)
PT Cancellation Note  Patient Details Name: Kelsey Joseph MRN: [id number] DOB: [name], 1929   Cancelled Treatment:    Reason Eval/Treat Not Completed: Active bedrest order   Sandy Salaam [name] 04/12/2020, 7:42 AM  Bayard Males, PT Acute Rehabilitation Services Pager: [phone] Office: [phone]

## 2020-04-12 NOTE — Progress Notes (Signed)
OT Cancellation Note  Patient Details Name: Kelsey Joseph MRN: [id number] DOB: 1927-08-08   Cancelled Treatment:    Reason Eval/Treat Not Completed: Active bedrest order  Kelsey Joseph 04/12/2020, 8:18 AM  Maurie Boettcher, OT/L   Acute OT Clinical Specialist Acute Rehabilitation Services Pager [phone] Office [phone]

## 2020-04-12 NOTE — Discharge Instructions (Signed)
NO driving until further notice   Hospital Discharge After a Stroke  Being discharged from the hospital after a stroke can feel overwhelming. Many things may be different, and it is normal to feel scared or anxious. Some stroke survivors may be able to return to their homes, and others may need more specialized care on a temporary or permanent basis. Your stroke care team will work with you to develop a discharge plan that is best for you. Ask questions if you do not understand something. Invite a friend or family member to participate in discharge planning. Understanding and following your discharge plan can help to prevent another stroke or other problems. Understanding your medicines After a stroke, your health care provider may prescribe one or more types of medicine. It is important to take medicines exactly as told by your health care provider. Serious harm, such as another stroke, can happen if you are unable to take your medicine exactly as prescribed. Make sure you understand:  What medicine to take.  Why you are taking the medicine.  How and when to take it.  If it can be taken with your other medicines and herbal supplements.  Possible side effects.  When to call your health care provider if you have any side effects.  How you will get and pay for your medicines. Medical assistance programs may be able to help you pay for prescription medicines if you cannot afford them. If you are taking an anticoagulant, be sure to take it exactly as told by your health care provider. This type of medicine can increase the risk of bleeding because it works to prevent blood from clotting. You may need to take certain precautions to prevent bleeding. You should contact your health care provider if you have:  Bleeding or bruising.  A fall or other injury to your head.  Blood in your urine or stool (feces). Planning for home safety  Take steps to prevent falls, such as installing grab bars or  using a shower chair. Ask a friend or family member to get needed things in place before you go home if possible. A therapist can come to your home to make recommendations for safety equipment. Ask your health care provider if you would benefit from this service or from home care. Getting needed equipment Ask your health care provider for a list of any medical equipment and supplies you will need at home. These may include items such as:  Walkers.  Canes.  Wheelchairs.  Hand-strengthening devices.  Special eating utensils. Medical equipment can be rented or purchased, depending on your insurance coverage. Check with your insurance company about what is covered. Keeping follow-up visits After a stroke, you will need to follow up regularly with a health care provider. You may also need rehabilitation, which can include physical therapy, occupational therapy, or speech-language therapy. Keeping these appointments is very important to your recovery after a stroke. Be sure to bring your medicine list and discharge papers with you to your appointments. If you need help to keep track of your schedule, use a calendar or appointment reminder. Preventing another stroke Having a stroke puts you at risk for another stroke in the future. Ask your health care provider what actions you can take to lower the risk. These may include:  Increasing how much you exercise.  Making a healthy eating plan.  Quitting smoking.  Managing other health conditions, such as high blood pressure, high cholesterol, or diabetes.  Limiting alcohol use. Knowing the warning  signs of a stroke  Make sure you understand the signs of a stroke. Before you leave the hospital, you will receive information outlining the stroke warning signs. Share these with your friends and family members. "BE FAST" is an easy way to remember the main warning signs of a stroke:  B - Balance. Signs are dizziness, sudden trouble walking, or loss  of balance.  E - Eyes. Signs are trouble seeing or a sudden change in vision.  F - Face. Signs are sudden weakness or numbness of the face, or the face or eyelid drooping on one side.  A - Arms. Signs are weakness or numbness in an arm. This happens suddenly and usually on one side of the body.  S - Speech. Signs are sudden trouble speaking, slurred speech, or trouble understanding what people say.  T - Time. Time to call emergency services. Write down what time symptoms started. Other signs of stroke may include:  A sudden, severe headache with no known cause.  Nausea or vomiting.  Seizure. These symptoms may represent a serious problem that is an emergency. Do not wait to see if the symptoms will go away. Get medical help right away. Call your local emergency services (911 in the [place] Do not drive yourself to the hospital. Make note of the time that you had your first symptoms. Your emergency responders or emergency room staff will need to know this information. Summary  Being discharged from the hospital after a stroke can feel overwhelming. It is normal to feel scared or anxious.  Make sure you take medicines exactly as told by your health care provider.  Know the warning signs of a stroke, and get help right way if you have any of these symptoms. "BE FAST" is an easy way to remember the main warning signs of a stroke. This information is not intended to replace advice given to you by your health care provider. Make sure you discuss any questions you have with your health care provider. Document Revised: 12/14/2018 Document Reviewed: 06/26/2016 Elsevier Patient Education  [place].

## 2020-04-12 NOTE — TOC Transition Note (Addendum)
    Final next level of care: [place] Barriers to Discharge: No Barriers Identified   Patient Goals and CMS Choice     Choice offered to / list presented to : Patient  Discharge Placement  Strokelike symptoms, received TPA, ready to Discharge, secured PT OT and RN for Home health through [facility] who accepted for PT OT and RN for medication management. .                      Discharge Plan and Services   Discharge Planning Services: CM Consult                      HH Arranged: RN,PT,OT [place] Agency: [facility] Date [place]: 04/12/20 Time [place]: [place] Representative spoke with at [place]!  Social Determinants of Health (SDOH) Interventions     Readmission Risk Interventions No flowsheet data found.

## 2020-04-12 NOTE — Plan of Care (Signed)
Patient understands risk factors of stroke and sign and symptoms of stroke.
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
## 2020-04-15 ENCOUNTER — Ambulatory Visit: Payer: Medicare Other

## 2020-04-15 ENCOUNTER — Encounter (HOSPITAL_COMMUNITY): Payer: Medicare Other

## 2020-04-30 ENCOUNTER — Ambulatory Visit (INDEPENDENT_AMBULATORY_CARE_PROVIDER_SITE_OTHER): Payer: Medicare Other | Admitting: Neurology

## 2020-04-30 ENCOUNTER — Other Ambulatory Visit: Payer: Self-pay

## 2020-04-30 ENCOUNTER — Encounter: Payer: Self-pay | Admitting: Neurology

## 2020-04-30 VITALS — BP 139/70 | HR 82 | Ht 63.0 in | Wt 131.4 lb

## 2020-04-30 DIAGNOSIS — R4189 Other symptoms and signs involving cognitive functions and awareness: Secondary | ICD-10-CM | POA: Diagnosis not present

## 2020-04-30 DIAGNOSIS — I6300 Cerebral infarction due to thrombosis of unspecified precerebral artery: Secondary | ICD-10-CM | POA: Diagnosis not present

## 2020-04-30 DIAGNOSIS — M5416 Radiculopathy, lumbar region: Secondary | ICD-10-CM | POA: Diagnosis not present

## 2020-04-30 MED ORDER — CLOPIDOGREL BISULFATE 75 MG PO TABS: 75.0000 mg | ORAL_TABLET | Freq: Every day | ORAL | 4 refills | Status: DC
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
## 2020-04-30 NOTE — Patient Instructions (Signed)
Stop ASA  

## 2020-05-01 LAB — VITAMIN B12: Vitamin B-12: 1095 pg/mL (ref 232–1245)

## 2020-05-08 ENCOUNTER — Other Ambulatory Visit: Payer: Self-pay | Admitting: Cardiovascular Disease

## 2020-05-09 ENCOUNTER — Other Ambulatory Visit: Payer: Self-pay | Admitting: *Deleted

## 2020-05-09 MED ORDER — CLOPIDOGREL BISULFATE 75 MG PO TABS: 75.0000 mg | ORAL_TABLET | Freq: Every day | ORAL | 4 refills | Status: DC

## 2020-05-23 ENCOUNTER — Ambulatory Visit (INDEPENDENT_AMBULATORY_CARE_PROVIDER_SITE_OTHER): Payer: Medicare Other

## 2020-05-23 DIAGNOSIS — I495 Sick sinus syndrome: Secondary | ICD-10-CM

## 2020-05-24 LAB — CUP PACEART REMOTE DEVICE CHECK
Battery Remaining Longevity: 30 mo
Battery Voltage: 2.96 V
Brady Statistic AP VP Percent: 1.67 %
Brady Statistic AP VS Percent: 67.39 %
Brady Statistic AS VP Percent: 20.38 %
Brady Statistic AS VS Percent: 10.57 %
Brady Statistic RA Percent Paced: 66.11 %
Brady Statistic RV Percent Paced: 19.98 %
Date Time Interrogation Session: 20220218123824
Implantable Lead Implant Date: 20151215
Implantable Lead Implant Date: 20151215
Implantable Lead Location: 753859
Implantable Lead Location: 753860
Implantable Lead Model: 5076
Implantable Lead Model: 5076
Implantable Pulse Generator Implant Date: 20151215
Lead Channel Impedance Value: 361 Ohm
Lead Channel Impedance Value: 380 Ohm
Lead Channel Impedance Value: 399 Ohm
Lead Channel Impedance Value: 513 Ohm
Lead Channel Pacing Threshold Amplitude: 0.875 V
Lead Channel Pacing Threshold Amplitude: 1 V
Lead Channel Pacing Threshold Pulse Width: 0.4 ms
Lead Channel Pacing Threshold Pulse Width: 0.4 ms
Lead Channel Sensing Intrinsic Amplitude: 1.625 mV
Lead Channel Sensing Intrinsic Amplitude: 1.625 mV
Lead Channel Sensing Intrinsic Amplitude: 14.75 mV
Lead Channel Sensing Intrinsic Amplitude: 14.75 mV
Lead Channel Setting Pacing Amplitude: 2 V
Lead Channel Setting Pacing Amplitude: 2 V
Lead Channel Setting Pacing Pulse Width: 0.4 ms
Lead Channel Setting Sensing Sensitivity: 2 mV

## 2020-05-29 NOTE — Progress Notes (Signed)
Remote pacemaker transmission.   

## 2020-06-26 ENCOUNTER — Institutional Professional Consult (permissible substitution): Payer: Medicare Other | Admitting: Neurology

## 2020-07-03 ENCOUNTER — Other Ambulatory Visit: Payer: Self-pay

## 2020-07-03 ENCOUNTER — Ambulatory Visit (INDEPENDENT_AMBULATORY_CARE_PROVIDER_SITE_OTHER): Payer: POS | Admitting: Cardiovascular Disease

## 2020-07-03 VITALS — BP 110/50 | Ht 63.5 in | Wt 130.0 lb

## 2020-07-03 DIAGNOSIS — I25118 Atherosclerotic heart disease of native coronary artery with other forms of angina pectoris: Secondary | ICD-10-CM

## 2020-07-03 DIAGNOSIS — I480 Paroxysmal atrial fibrillation: Secondary | ICD-10-CM

## 2020-07-03 DIAGNOSIS — H547 Unspecified visual loss: Secondary | ICD-10-CM

## 2020-07-03 DIAGNOSIS — I495 Sick sinus syndrome: Secondary | ICD-10-CM

## 2020-07-03 DIAGNOSIS — I739 Peripheral vascular disease, unspecified: Secondary | ICD-10-CM

## 2020-07-03 DIAGNOSIS — Z950 Presence of cardiac pacemaker: Secondary | ICD-10-CM

## 2020-07-03 DIAGNOSIS — I5032 Chronic diastolic (congestive) heart failure: Secondary | ICD-10-CM

## 2020-07-03 DIAGNOSIS — D500 Iron deficiency anemia secondary to blood loss (chronic): Secondary | ICD-10-CM

## 2020-07-03 DIAGNOSIS — I10 Essential (primary) hypertension: Secondary | ICD-10-CM

## 2020-07-03 DIAGNOSIS — E7800 Pure hypercholesterolemia, unspecified: Secondary | ICD-10-CM

## 2020-07-03 LAB — PACEMAKER DEVICE OBSERVATION

## 2020-07-03 MED ORDER — APIXABAN 2.5 MG PO TABS: 2.5000 mg | ORAL_TABLET | Freq: Two times a day (BID) | ORAL | 11 refills | Status: AC
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
## 2020-07-03 NOTE — Patient Instructions (Addendum)
Medication Instructions:  STOP the Aspirin STOP the Plavix  START Eliquis 2.5 mg twice daily  *If you need a refill on your cardiac medications before your next appointment, please call your pharmacy*   Lab Work: Your provider would like for you to return in one month to have the following labs drawn: CBC. You do not need an appointment for the lab. Once in our office lobby there is a podium where you can sign in and ring the doorbell to alert [place] that you are here. The lab is open from 8:00 am to 4:30 pm; closed for lunch from 12:45pm-1:45pm.  If you have labs (blood work) drawn today and your tests are completely normal, you will receive your results only by: Marland Kitchen MyChart Message (if you have MyChart) OR . A paper copy in the mail If you have any lab test that is abnormal or we need to change your treatment, we will call you to review the results.   Testing/Procedures: None ordered   Follow-Up: At [facility], you and your health needs are our priority.  As part of our continuing mission to provide you with exceptional heart care, we have created designated Provider Care Teams.  These Care Teams include your primary Cardiologist (physician) and Advanced Practice Providers (APPs -  Physician Assistants and Nurse Practitioners) who all work together to provide you with the care you need, when you need it.  We recommend signing up for the patient portal called "MyChart".  Sign up information is provided on this After Visit Summary.  MyChart is used to connect with patients for Virtual Visits (Telemedicine).  Patients are able to view lab/test results, encounter notes, upcoming appointments, etc.  Non-urgent messages can be sent to your provider as well.   To learn more about what you can do with MyChart, go to [email or web address].    Your next appointment:   3 month(s)  The format for your next appointment:   In Person  Provider:   You may see Sanda Klein, MD or one of the  following Advanced Practice Providers on your designated Care Team:    Almyra Deforest, PA-C  Fabian Sharp, PA-C or   Roby Lofts, [place]

## 2020-07-04 ENCOUNTER — Encounter: Payer: Self-pay | Admitting: Cardiovascular Disease

## 2020-07-15 ENCOUNTER — Other Ambulatory Visit: Payer: Self-pay | Admitting: Cardiovascular Disease

## 2020-07-17 ENCOUNTER — Other Ambulatory Visit: Payer: Self-pay

## 2020-07-17 NOTE — Patient Outreach (Signed)
[facility] Care Management  07/17/2020  Starkisha E Reever 05/02/1927 [id number]  First telephone outreach attempt to obtain mRS. No answer. Left message for returned call.  [name] Management Assistant [phone]

## 2020-07-22 ENCOUNTER — Other Ambulatory Visit: Payer: Self-pay

## 2020-07-22 NOTE — Patient Outreach (Signed)
[facility]) Care Management  07/22/2020  Kelsey Joseph [birth year] [id number]   Second telephone outreach attempt to obtain mRS. No answer. Left message for returned call.  [name] Management Assistant [phone]

## 2020-07-23 ENCOUNTER — Other Ambulatory Visit: Payer: Self-pay

## 2020-07-23 NOTE — Patient Outreach (Signed)
[name]  07/23/2020  Kelsey Joseph 08/10/1927 [id number]   3 outreach attempts were completed to obtain mRs. mRs could not be obtained because patient never returned my calls. mRs=7    [place] Management Assistant [phone]

## 2020-10-02 ENCOUNTER — Other Ambulatory Visit: Payer: Self-pay

## 2020-10-02 ENCOUNTER — Ambulatory Visit (INDEPENDENT_AMBULATORY_CARE_PROVIDER_SITE_OTHER): Payer: Medicare Other | Admitting: Cardiovascular Disease

## 2020-10-02 VITALS — BP 146/79 | HR 63 | Ht 63.5 in | Wt 126.0 lb

## 2020-10-02 DIAGNOSIS — I495 Sick sinus syndrome: Secondary | ICD-10-CM

## 2020-10-02 DIAGNOSIS — I480 Paroxysmal atrial fibrillation: Secondary | ICD-10-CM | POA: Diagnosis not present

## 2020-10-02 DIAGNOSIS — I739 Peripheral vascular disease, unspecified: Secondary | ICD-10-CM | POA: Diagnosis not present

## 2020-10-02 DIAGNOSIS — D500 Iron deficiency anemia secondary to blood loss (chronic): Secondary | ICD-10-CM

## 2020-10-02 DIAGNOSIS — I5032 Chronic diastolic (congestive) heart failure: Secondary | ICD-10-CM

## 2020-10-02 DIAGNOSIS — H547 Unspecified visual loss: Secondary | ICD-10-CM

## 2020-10-02 DIAGNOSIS — I25118 Atherosclerotic heart disease of native coronary artery with other forms of angina pectoris: Secondary | ICD-10-CM | POA: Diagnosis not present

## 2020-10-02 DIAGNOSIS — I10 Essential (primary) hypertension: Secondary | ICD-10-CM

## 2020-10-02 DIAGNOSIS — E7800 Pure hypercholesterolemia, unspecified: Secondary | ICD-10-CM

## 2020-10-02 DIAGNOSIS — Z89621 Acquired absence of right hip joint: Secondary | ICD-10-CM

## 2020-10-02 DIAGNOSIS — Z950 Presence of cardiac pacemaker: Secondary | ICD-10-CM

## 2020-10-02 LAB — PACEMAKER DEVICE OBSERVATION

## 2020-10-02 MED ORDER — VERAPAMIL HCL ER 120 MG PO TBCR: 120.0000 mg | EXTENDED_RELEASE_TABLET | Freq: Every day | ORAL | 1 refills | Status: DC
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
## 2020-10-02 NOTE — Patient Instructions (Signed)
Medication Instructions:  DECREASE the Verapamil to 120 mg once daily  *If you need a refill on your cardiac medications before your next appointment, please call your pharmacy*   Lab Work: Your provider would like for you to have the following labs today: CBC and BMET  If you have labs (blood work) drawn today and your tests are completely normal, you will receive your results only by: [place] (if you have MyChart) OR A paper copy in the mail If you have any lab test that is abnormal or we need to change your treatment, we will call you to review the results.   Testing/Procedures: None ordered   Follow-Up: At [facility], you and your health needs are our priority.  As part of our continuing mission to provide you with exceptional heart care, we have created designated Provider Care Teams.  These Care Teams include your primary Cardiologist (physician) and Advanced Practice Providers (APPs -  Physician Assistants and Nurse Practitioners) who all work together to provide you with the care you need, when you need it.  We recommend signing up for the patient portal called "MyChart".  Sign up information is provided on this After Visit Summary.  MyChart is used to connect with patients for Virtual Visits (Telemedicine).  Patients are able to view lab/test results, encounter notes, upcoming appointments, etc.  Non-urgent messages can be sent to your provider as well.   To learn more about what you can do with MyChart, go to [email or web address].    Your next appointment:   Follow up in November with Dr. Sallyanne Kuster

## 2020-10-03 LAB — CBC
Hematocrit: 40 % (ref 34.0–46.6)
Hemoglobin: 13.3 g/dL (ref 11.1–15.9)
MCH: 30.9 pg (ref 26.6–33.0)
MCHC: 33.3 g/dL (ref 31.5–35.7)
MCV: 93 fL (ref 79–97)
Platelets: 197 10*3/uL (ref 150–450)
RBC: 4.3 x10E6/uL (ref 3.77–5.28)
RDW: 15.2 % (ref 11.7–15.4)
WBC: 6.5 10*3/uL (ref 3.4–10.8)

## 2020-10-03 LAB — BASIC METABOLIC PANEL
BUN/Creatinine Ratio: 37 — ABNORMAL HIGH (ref 12–28)
BUN: 37 mg/dL — ABNORMAL HIGH (ref 10–36)
CO2: 28 mmol/L (ref 20–29)
Calcium: 10 mg/dL (ref 8.7–10.3)
Chloride: 97 mmol/L (ref 96–106)
Creatinine, Ser: 0.99 mg/dL (ref 0.57–1.00)
Glucose: 106 mg/dL — ABNORMAL HIGH (ref 65–99)
Potassium: 4.1 mmol/L (ref 3.5–5.2)
Sodium: 141 mmol/L (ref 134–144)
eGFR: 54 mL/min/{1.73_m2} — ABNORMAL LOW (ref >59)

## 2020-10-04 ENCOUNTER — Encounter: Payer: Self-pay | Admitting: *Deleted

## 2020-10-04 NOTE — Progress Notes (Signed)
I called Kelsey Joseph to alert her of possible COVID-19 exposure during our clinic visit, since I tested positive for the illness the next morning (I had a negative test on the morning of the appointment). I asked her to monitor for fever, cough, dyspnea, sore throat, etc. And to test for COVID if these occur, call me or PCP to see if additional treatment is warranted.

## 2020-10-30 ENCOUNTER — Telehealth: Payer: Self-pay | Admitting: Cardiovascular Disease

## 2020-10-30 NOTE — Telephone Encounter (Signed)
Spoke to Daughter. She states patient is having a lot of  joint pain in her wrist and hips .  Not able to sleep well at night also because of the discomfort. Daughter would like to know what she could take for the discomfort. Since patient is on Eliquis and blood pressure medications.   Daughter states she use take Mobic in the past.  Daughter is aware if patient needs an prescription PCP will need to prescribe it but we can give a recommendation of medication.  Aware will defer to Dr Sallyanne Kuster and [facility] pharmacist

## 2020-10-30 NOTE — Telephone Encounter (Signed)
New Message:     Please call patient's daughter. She saAys her Mother needs something for her Arthritidis. She is  concerned, that the  patient will be raking the Arthritidis medicine along with her blood thinner. She needs the blood thinner to having a Stroke.

## 2020-10-30 NOTE — Telephone Encounter (Signed)
spoke to patient Kelsey Joseph , information from pharmacist and Dr Livingston Diones, Kelsey Gobble, MD (2:28 PM)    Ok to use OTC ibuprofen 200-400 mg sparingly (avoid taking daily as this promotes fluid retention, kidney damage and GI bleeding). Better choice than Mobic (same side effects, but much longer duration of effect, so may be even less safe).    Kelsey Joseph voiced understanding.

## 2020-10-30 NOTE — Telephone Encounter (Signed)
Patient can try acetaminophen '325mg'$  two tablets ('650mg'$ ) every 6 hours

## 2020-10-30 NOTE — Telephone Encounter (Signed)
I did not need this encounter. °

## 2020-11-21 ENCOUNTER — Ambulatory Visit (INDEPENDENT_AMBULATORY_CARE_PROVIDER_SITE_OTHER): Payer: Medicare Other

## 2020-11-21 DIAGNOSIS — I495 Sick sinus syndrome: Secondary | ICD-10-CM

## 2020-11-25 LAB — CUP PACEART REMOTE DEVICE CHECK
Battery Remaining Longevity: 34 mo
Battery Voltage: 2.96 V
Brady Statistic AP VP Percent: 1.96 %
Brady Statistic AP VS Percent: 91.8 %
Brady Statistic AS VP Percent: 1.26 %
Brady Statistic AS VS Percent: 4.99 %
Brady Statistic RA Percent Paced: 91.83 %
Brady Statistic RV Percent Paced: 3.2 %
Date Time Interrogation Session: 20220819122955
Implantable Lead Implant Date: 20151215
Implantable Lead Implant Date: 20151215
Implantable Lead Location: 753859
Implantable Lead Location: 753860
Implantable Lead Model: 5076
Implantable Lead Model: 5076
Implantable Pulse Generator Implant Date: 20151215
Lead Channel Impedance Value: 323 Ohm
Lead Channel Impedance Value: 361 Ohm
Lead Channel Impedance Value: 380 Ohm
Lead Channel Impedance Value: 475 Ohm
Lead Channel Pacing Threshold Amplitude: 0.875 V
Lead Channel Pacing Threshold Amplitude: 0.875 V
Lead Channel Pacing Threshold Pulse Width: 0.4 ms
Lead Channel Pacing Threshold Pulse Width: 0.4 ms
Lead Channel Sensing Intrinsic Amplitude: 1.625 mV
Lead Channel Sensing Intrinsic Amplitude: 1.625 mV
Lead Channel Sensing Intrinsic Amplitude: 15.25 mV
Lead Channel Sensing Intrinsic Amplitude: 15.25 mV
Lead Channel Setting Pacing Amplitude: 1.75 V
Lead Channel Setting Pacing Amplitude: 2 V
Lead Channel Setting Pacing Pulse Width: 0.4 ms
Lead Channel Setting Sensing Sensitivity: 2 mV

## 2020-12-11 NOTE — Progress Notes (Signed)
Remote pacemaker transmission.   

## 2021-02-19 ENCOUNTER — Other Ambulatory Visit: Payer: Self-pay | Admitting: Cardiovascular Disease

## 2021-02-25 ENCOUNTER — Other Ambulatory Visit: Payer: Self-pay

## 2021-02-25 ENCOUNTER — Ambulatory Visit (INDEPENDENT_AMBULATORY_CARE_PROVIDER_SITE_OTHER): Payer: Medicare Other | Admitting: Cardiovascular Disease

## 2021-02-25 ENCOUNTER — Encounter: Payer: Self-pay | Admitting: Cardiovascular Disease

## 2021-02-25 VITALS — BP 150/80 | HR 79 | Ht 63.5 in

## 2021-02-25 DIAGNOSIS — Z950 Presence of cardiac pacemaker: Secondary | ICD-10-CM

## 2021-02-25 DIAGNOSIS — Z8673 Personal history of transient ischemic attack (TIA), and cerebral infarction without residual deficits: Secondary | ICD-10-CM | POA: Diagnosis not present

## 2021-02-25 DIAGNOSIS — I25118 Atherosclerotic heart disease of native coronary artery with other forms of angina pectoris: Secondary | ICD-10-CM

## 2021-02-25 DIAGNOSIS — G720 Drug-induced myopathy: Secondary | ICD-10-CM

## 2021-02-25 DIAGNOSIS — I471 Supraventricular tachycardia: Secondary | ICD-10-CM

## 2021-02-25 DIAGNOSIS — I10 Essential (primary) hypertension: Secondary | ICD-10-CM

## 2021-02-25 DIAGNOSIS — E7800 Pure hypercholesterolemia, unspecified: Secondary | ICD-10-CM

## 2021-02-25 DIAGNOSIS — T466X5A Adverse effect of antihyperlipidemic and antiarteriosclerotic drugs, initial encounter: Secondary | ICD-10-CM

## 2021-02-25 DIAGNOSIS — D500 Iron deficiency anemia secondary to blood loss (chronic): Secondary | ICD-10-CM

## 2021-02-25 DIAGNOSIS — I4719 Other supraventricular tachycardia: Secondary | ICD-10-CM

## 2021-02-25 DIAGNOSIS — I480 Paroxysmal atrial fibrillation: Secondary | ICD-10-CM

## 2021-02-25 DIAGNOSIS — I5032 Chronic diastolic (congestive) heart failure: Secondary | ICD-10-CM

## 2021-02-25 DIAGNOSIS — I739 Peripheral vascular disease, unspecified: Secondary | ICD-10-CM

## 2021-02-25 DIAGNOSIS — Z89621 Acquired absence of right hip joint: Secondary | ICD-10-CM

## 2021-02-25 DIAGNOSIS — I495 Sick sinus syndrome: Secondary | ICD-10-CM

## 2021-02-25 NOTE — Patient Instructions (Signed)
Medication Instructions:  No changes *If you need a refill on your cardiac medications before your next appointment, please call your pharmacy*   Lab Work: Your provider would like for you to have the following labs today: CBC  If you have labs (blood work) drawn today and your tests are completely normal, you will receive your results only by: [place] (if you have MyChart) OR A paper copy in the mail If you have any lab test that is abnormal or we need to change your treatment, we will call you to review the results.   Testing/Procedures: None ordered   Follow-Up: At [facility], you and your health needs are our priority.  As part of our continuing mission to provide you with exceptional heart care, we have created designated Provider Care Teams.  These Care Teams include your primary Cardiologist (physician) and Advanced Practice Providers (APPs -  Physician Assistants and Nurse Practitioners) who all work together to provide you with the care you need, when you need it.  We recommend signing up for the patient portal called "MyChart".  Sign up information is provided on this After Visit Summary.  MyChart is used to connect with patients for Virtual Visits (Telemedicine).  Patients are able to view lab/test results, encounter notes, upcoming appointments, etc.  Non-urgent messages can be sent to your provider as well.   To learn more about what you can do with MyChart, go to [email or web address].    Your next appointment:   6 month(s)  The format for your next appointment:   In Person  Provider:   None
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
## 2021-02-26 ENCOUNTER — Encounter: Payer: Self-pay | Admitting: *Deleted

## 2021-02-26 LAB — CBC
Hematocrit: 38.4 % (ref 34.0–46.6)
Hemoglobin: 12.4 g/dL (ref 11.1–15.9)
MCH: 30 pg (ref 26.6–33.0)
MCHC: 32.3 g/dL (ref 31.5–35.7)
MCV: 93 fL (ref 79–97)
Platelets: 184 10*3/uL (ref 150–450)
RBC: 4.14 x10E6/uL (ref 3.77–5.28)
RDW: 15 % (ref 11.7–15.4)
WBC: 8.1 10*3/uL (ref 3.4–10.8)

## 2021-03-01 ENCOUNTER — Encounter: Payer: Self-pay | Admitting: Cardiovascular Disease

## 2021-03-17 ENCOUNTER — Emergency Department (HOSPITAL_COMMUNITY): Payer: Medicare Other

## 2021-03-17 ENCOUNTER — Other Ambulatory Visit: Payer: Self-pay

## 2021-03-17 ENCOUNTER — Emergency Department (HOSPITAL_COMMUNITY)
Admission: EM | Admit: 2021-03-17 | Discharge: 2021-03-17 | Disposition: A | Discharge status: Home / Self Care | Payer: Medicare Other | Attending: Emergency Medicine | Admitting: Emergency Medicine

## 2021-03-17 ENCOUNTER — Encounter (HOSPITAL_COMMUNITY): Payer: Self-pay | Admitting: Emergency Medicine

## 2021-03-17 DIAGNOSIS — E039 Hypothyroidism, unspecified: Secondary | ICD-10-CM | POA: Insufficient documentation

## 2021-03-17 DIAGNOSIS — E1140 Type 2 diabetes mellitus with diabetic neuropathy, unspecified: Secondary | ICD-10-CM | POA: Diagnosis not present

## 2021-03-17 DIAGNOSIS — I130 Hypertensive heart and chronic kidney disease with heart failure and stage 1 through stage 4 chronic kidney disease, or unspecified chronic kidney disease: Secondary | ICD-10-CM | POA: Insufficient documentation

## 2021-03-17 DIAGNOSIS — Z91040 Latex allergy status: Secondary | ICD-10-CM | POA: Diagnosis not present

## 2021-03-17 DIAGNOSIS — R079 Chest pain, unspecified: Secondary | ICD-10-CM

## 2021-03-17 DIAGNOSIS — R0789 Other chest pain: Secondary | ICD-10-CM | POA: Diagnosis not present

## 2021-03-17 DIAGNOSIS — I5033 Acute on chronic diastolic (congestive) heart failure: Secondary | ICD-10-CM | POA: Insufficient documentation

## 2021-03-17 DIAGNOSIS — Z7901 Long term (current) use of anticoagulants: Secondary | ICD-10-CM | POA: Diagnosis not present

## 2021-03-17 DIAGNOSIS — R519 Headache, unspecified: Secondary | ICD-10-CM | POA: Diagnosis not present

## 2021-03-17 DIAGNOSIS — N183 Chronic kidney disease, stage 3 unspecified: Secondary | ICD-10-CM | POA: Diagnosis not present

## 2021-03-17 DIAGNOSIS — Z96652 Presence of left artificial knee joint: Secondary | ICD-10-CM | POA: Insufficient documentation

## 2021-03-17 DIAGNOSIS — D631 Anemia in chronic kidney disease: Secondary | ICD-10-CM | POA: Diagnosis not present

## 2021-03-17 DIAGNOSIS — Z79899 Other long term (current) drug therapy: Secondary | ICD-10-CM | POA: Diagnosis not present

## 2021-03-17 DIAGNOSIS — Z87891 Personal history of nicotine dependence: Secondary | ICD-10-CM | POA: Insufficient documentation

## 2021-03-17 DIAGNOSIS — E1122 Type 2 diabetes mellitus with diabetic chronic kidney disease: Secondary | ICD-10-CM | POA: Diagnosis not present

## 2021-03-17 DIAGNOSIS — I4891 Unspecified atrial fibrillation: Secondary | ICD-10-CM | POA: Insufficient documentation

## 2021-03-17 DIAGNOSIS — Z7984 Long term (current) use of oral hypoglycemic drugs: Secondary | ICD-10-CM | POA: Insufficient documentation

## 2021-03-17 DIAGNOSIS — I2510 Atherosclerotic heart disease of native coronary artery without angina pectoris: Secondary | ICD-10-CM | POA: Insufficient documentation

## 2021-03-17 DIAGNOSIS — I10 Essential (primary) hypertension: Secondary | ICD-10-CM

## 2021-03-17 DIAGNOSIS — R030 Elevated blood-pressure reading, without diagnosis of hypertension: Secondary | ICD-10-CM | POA: Diagnosis present

## 2021-03-17 LAB — BASIC METABOLIC PANEL
Anion gap: 10 (ref 5–15)
BUN: 28 mg/dL — ABNORMAL HIGH (ref 8–23)
CO2: 28 mmol/L (ref 22–32)
Calcium: 9.2 mg/dL (ref 8.9–10.3)
Chloride: 103 mmol/L (ref 98–111)
Creatinine, Ser: 0.92 mg/dL (ref 0.44–1.00)
GFR, Estimated: 59 mL/min — ABNORMAL LOW (ref >60)
Glucose, Bld: 116 mg/dL — ABNORMAL HIGH (ref 70–99)
Potassium: 4.1 mmol/L (ref 3.5–5.1)
Sodium: 141 mmol/L (ref 135–145)

## 2021-03-17 LAB — CBC
HCT: 39.9 % (ref 36.0–46.0)
Hemoglobin: 12.7 g/dL (ref 12.0–15.0)
MCH: 30.1 pg (ref 26.0–34.0)
MCHC: 31.8 g/dL (ref 30.0–36.0)
MCV: 94.5 fL (ref 80.0–100.0)
Platelets: 169 10*3/uL (ref 150–400)
RBC: 4.22 MIL/uL (ref 3.87–5.11)
RDW: 17.9 % — ABNORMAL HIGH (ref 11.5–15.5)
WBC: 5.8 10*3/uL (ref 4.0–10.5)
nRBC: 0 % (ref 0.0–0.2)

## 2021-03-17 LAB — TROPONIN I (HIGH SENSITIVITY)
Troponin I (High Sensitivity): 9 ng/L (ref <18)
Troponin I (High Sensitivity): 9 ng/L (ref <18)

## 2021-03-17 MED ORDER — ACETAMINOPHEN 500 MG PO TABS
1000.0000 mg | ORAL_TABLET | Freq: Once | ORAL | Status: AC
  Administered 2021-03-17: 1000 mg via ORAL
  Filled 2021-03-17: qty 2

## 2021-03-17 MED ORDER — IRBESARTAN 300 MG PO TABS
300.0000 mg | ORAL_TABLET | Freq: Once | ORAL | Status: AC
  Administered 2021-03-17: 300 mg via ORAL
  Filled 2021-03-17: qty 1
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
## 2021-03-17 NOTE — ED Triage Notes (Signed)
BIBA.  Per EMS pt from [facility]. Pt c/o "chest discomfort. EMS reports pt was getting ready to walk her dog when she realized she had some chest pressure. She then called for staff. When EMS arrived pt denied chest pain and stated she had some pressure in her head.  A&O but does have moments of forgetting.   324 mg ASA 1 NTG 1 20 left hand BP 203/100 HR 62 RR 18 O2 sat - 93 CBG 243 hx of DM.

## 2021-03-17 NOTE — Discharge Instructions (Signed)
So you need to take all your evening medications tonight and if you have medicines in the morning that you only take 1 time a day take those.  Then resume your normal schedule tomorrow.  All the lab work and the CAT scan of your brain look normal today.  If you continue to have elevated blood pressure you should follow-up with your doctor as they may need to adjust to your blood pressure medication.

## 2021-06-11 ENCOUNTER — Telehealth: Payer: Self-pay | Admitting: Cardiovascular Disease

## 2021-06-11 NOTE — Telephone Encounter (Signed)
? ?  Pt c/o Shortness Of Breath: STAT if SOB developed within the last 24 hours or pt is noticeably SOB on the phone ? ?1. Are you currently SOB (can you hear that pt is SOB on the phone)? No  ? ?2. How long have you been experiencing SOB? SOB getting worst ? ?3. Are you SOB when sitting or when up moving around? Sitting  ? ?4. Are you currently experiencing any other symptoms? Pt's daughter said, pt's sob is getting worst, she did increased pt's torsemide, she gives it 1 and 1/2 tablet in am and 1 tablet to 1 and 1/2 tablet in pm. She said pt been in a lot of pain as well, she wanted to discuss with a nurse how or what she can do to help pt be more comfortable. She wanted to ask Dr. Loletha Grayer if pt's meds needs to be change  ?

## 2021-06-11 NOTE — Telephone Encounter (Addendum)
Returned the call to the daughter to discuss the patient. She stated that the patient has been having shortness of breath and swelling in her leg. She currently takes Torsemide 30 mg once daily. The daughter has been giving her an extra 1-1.5 tablets about every other night for the past few months to help. She stated that the extra dose has been helping. When they try to give her just 30 mg, the swelling and shortness of breath worsens. ? ?She stated that she does not feel like the shortness of breath is worse when she is lying down but the patient has been wheezing. They are unable to weigh her.  ? ?The daughter stated that she feels the patient's quality of life is starting to decline and she wants her to be comfortable.  ? ?The daughter has been made aware that it looks like we have not had a transmission since August. Message will be sent to the device clinic to help the daughter with this. ? ?Appointment made for 3/13 with Dr. Sallyanne Kuster.  ? ?

## 2021-06-12 NOTE — Telephone Encounter (Signed)
LMOVM for daughter to call the device clinic to receive help sending a manual transmission. Left direct number for daughter to return call. ?

## 2021-06-13 NOTE — Telephone Encounter (Signed)
I spoke with the patient daughter. She cannot locate the patient monitor.  I ordered the patient a new monitor and had it sent to her daughter's address [address] [place], [place] [id number]. She should receive the new monitor in 7-10 business days. ?

## 2021-06-16 ENCOUNTER — Encounter: Payer: Self-pay | Admitting: Cardiovascular Disease

## 2021-06-16 ENCOUNTER — Ambulatory Visit (INDEPENDENT_AMBULATORY_CARE_PROVIDER_SITE_OTHER): Payer: Medicare Other | Admitting: Cardiovascular Disease

## 2021-06-16 ENCOUNTER — Other Ambulatory Visit: Payer: Self-pay

## 2021-06-16 VITALS — BP 116/67 | HR 86 | Ht 63.5 in | Wt 127.0 lb

## 2021-06-16 DIAGNOSIS — I5032 Chronic diastolic (congestive) heart failure: Secondary | ICD-10-CM

## 2021-06-16 DIAGNOSIS — I739 Peripheral vascular disease, unspecified: Secondary | ICD-10-CM

## 2021-06-16 DIAGNOSIS — I25118 Atherosclerotic heart disease of native coronary artery with other forms of angina pectoris: Secondary | ICD-10-CM

## 2021-06-16 DIAGNOSIS — E7800 Pure hypercholesterolemia, unspecified: Secondary | ICD-10-CM

## 2021-06-16 DIAGNOSIS — Z8673 Personal history of transient ischemic attack (TIA), and cerebral infarction without residual deficits: Secondary | ICD-10-CM

## 2021-06-16 DIAGNOSIS — I495 Sick sinus syndrome: Secondary | ICD-10-CM

## 2021-06-16 DIAGNOSIS — I480 Paroxysmal atrial fibrillation: Secondary | ICD-10-CM

## 2021-06-16 DIAGNOSIS — Z950 Presence of cardiac pacemaker: Secondary | ICD-10-CM

## 2021-06-16 DIAGNOSIS — D500 Iron deficiency anemia secondary to blood loss (chronic): Secondary | ICD-10-CM

## 2021-06-16 DIAGNOSIS — I10 Essential (primary) hypertension: Secondary | ICD-10-CM

## 2021-06-16 DIAGNOSIS — Z89621 Acquired absence of right hip joint: Secondary | ICD-10-CM

## 2021-06-16 MED ORDER — TORSEMIDE 20 MG PO TABS
30.0000 mg | ORAL_TABLET | Freq: Two times a day (BID) | ORAL | 1 refills | Status: AC
Start: 2021-06-16 — End: ?

## 2021-06-16 MED ORDER — TORSEMIDE 20 MG PO TABS: 30.0000 mg | ORAL_TABLET | Freq: Two times a day (BID) | ORAL | 1 refills | Status: DC

## 2021-06-16 NOTE — Patient Instructions (Signed)
Medication Instructions:  INCREASE the Torsemide to 30 mg twice a day  *If you need a refill on your cardiac medications before your next appointment, please call your pharmacy*   Lab Work: Your provider would like for you to return in 2 weeks  to have the following labs drawn: BNP and BMET. You do not need an appointment for the lab. Once in our office lobby there is a podium where you can sign in and ring the doorbell to alert [place] that you are here. The lab is open from 8:00 am to 4:30 pm; closed for lunch from 12:45pm-1:45pm.  If you have labs (blood work) drawn today and your tests are completely normal, you will receive your results only by: Kelsey Joseph (if you have MyChart) OR A paper copy in the mail If you have any lab test that is abnormal or we need to change your treatment, we will call you to review the results.   Testing/Procedures: None ordered   Follow-Up: At [facility], you and your health needs are our priority.  As part of our continuing mission to provide you with exceptional heart care, we have created designated Provider Care Teams.  These Care Teams include your primary Cardiologist (physician) and Advanced Practice Providers (APPs -  Physician Assistants and Nurse Practitioners) who all work together to provide you with the care you need, when you need it.  We recommend signing up for the patient portal called "MyChart".  Sign up information is provided on this After Visit Summary.  MyChart is used to connect with patients for Virtual Visits (Telemedicine).  Patients are able to view lab/test results, encounter notes, upcoming appointments, etc.  Non-urgent messages can be sent to your provider as well.   To learn more about what you can do with MyChart, go to [email or web address].    Your next appointment:   6 week(s) on a device day  The format for your next appointment:   In Person  Provider:   Sanda Klein, MD {  Other Instructions Your  physician recommends that you weigh yourself twice a week at the same time, on the same scale and with the same amount of clothing. Please keep a record of these weights and bring to your next appointment.  Low-Sodium Eating Plan Sodium, which is an element that makes up salt, helps you maintain a healthy balance of fluids in your body. Too much sodium can increase your blood pressure and cause fluid and waste to be held in your body. Your health care provider or dietitian may recommend following this plan if you have high blood pressure (hypertension), kidney disease, liver disease, or heart failure. Eating less sodium can help lower your blood pressure, reduce swelling, and protect your heart, liver, and kidneys. What are tips for following this plan? Reading food labels The Nutrition Facts label lists the amount of sodium in one serving of the food. If you eat more than one serving, you must multiply the listed amount of sodium by the number of servings. Choose foods with less than 140 mg of sodium per serving. Avoid foods with 300 mg of sodium or more per serving. Shopping  Look for lower-sodium products, often labeled as "low-sodium" or "no salt added." Always check the sodium content, even if foods are labeled as "unsalted" or "no salt added." Buy fresh foods. Avoid canned foods and pre-made or frozen meals. Avoid canned, cured, or processed meats. Buy breads that have less than 80 mg of sodium per slice.  Cooking  Eat more home-cooked food and less restaurant, buffet, and fast food. Avoid adding salt when cooking. Use salt-free seasonings or herbs instead of table salt or sea salt. Check with your health care provider or pharmacist before using salt substitutes. Cook with plant-based oils, such as canola, sunflower, or olive oil. Meal planning When eating at a restaurant, ask that your food be prepared with less salt or no salt, if possible. Avoid dishes labeled as brined, pickled,  cured, smoked, or made with soy sauce, miso, or teriyaki sauce. Avoid foods that contain MSG (monosodium glutamate). MSG is sometimes added to [place] food, bouillon, and some canned foods. Make meals that can be grilled, baked, poached, roasted, or steamed. These are generally made with less sodium. General information Most people on this plan should limit their sodium intake to 1,500-2,000 mg (milligrams) of sodium each day. What foods should I eat? Fruits Fresh, frozen, or canned fruit. Fruit juice. Vegetables Fresh or frozen vegetables. "No salt added" canned vegetables. "No salt added" tomato sauce and paste. Low-sodium or reduced-sodium tomato and vegetable juice. Grains Low-sodium cereals, including oats, puffed wheat and rice, and shredded wheat. Low-sodium crackers. Unsalted rice. Unsalted pasta. Low-sodium bread. Whole-grain breads and whole-grain pasta. Meats and other proteins Fresh or frozen (no salt added) meat, poultry, seafood, and fish. Low-sodium canned tuna and salmon. Unsalted nuts. Dried peas, beans, and lentils without added salt. Unsalted canned beans. Eggs. Unsalted nut butters. Dairy Milk. Soy milk. Cheese that is naturally low in sodium, such as ricotta cheese, fresh mozzarella, or Swiss cheese. Low-sodium or reduced-sodium cheese. Cream cheese. Yogurt. Seasonings and condiments Fresh and dried herbs and spices. Salt-free seasonings. Low-sodium mustard and ketchup. Sodium-free salad dressing. Sodium-free light mayonnaise. Fresh or refrigerated horseradish. Lemon juice. Vinegar. Other foods Homemade, reduced-sodium, or low-sodium soups. Unsalted popcorn and pretzels. Low-salt or salt-free chips. The items listed above may not be a complete list of foods and beverages you can eat. Contact a dietitian for more information. What foods should I avoid? Vegetables Sauerkraut, pickled vegetables, and relishes. Olives. [place] fries. Onion rings. Regular canned vegetables (not  low-sodium or reduced-sodium). Regular canned tomato sauce and paste (not low-sodium or reduced-sodium). Regular tomato and vegetable juice (not low-sodium or reduced-sodium). Frozen vegetables in sauces. Grains Instant hot cereals. Bread stuffing, pancake, and biscuit mixes. Croutons. Seasoned rice or pasta mixes. Noodle soup cups. Boxed or frozen macaroni and cheese. Regular salted crackers. Self-rising flour. Meats and other proteins Meat or fish that is salted, canned, smoked, spiced, or pickled. Precooked or cured meat, such as sausages or meat loaves. Berniece Salines. Ham. Pepperoni. Hot dogs. Corned beef. Chipped beef. Salt pork. Jerky. Pickled herring. Anchovies and sardines. Regular canned tuna. Salted nuts. Dairy Processed cheese and cheese spreads. Hard cheeses. Cheese curds. Blue cheese. Feta cheese. String cheese. Regular cottage cheese. Buttermilk. Canned milk. Fats and oils Salted butter. Regular margarine. Ghee. Bacon fat. Seasonings and condiments Onion salt, garlic salt, seasoned salt, table salt, and sea salt. Canned and packaged gravies. Worcestershire sauce. Tartar sauce. Barbecue sauce. Teriyaki sauce. Soy sauce, including reduced-sodium. Steak sauce. Fish sauce. Oyster sauce. Cocktail sauce. Horseradish that you find on the shelf. Regular ketchup and mustard. Meat flavorings and tenderizers. Bouillon cubes. Hot sauce. Pre-made or packaged marinades. Pre-made or packaged taco seasonings. Relishes. Regular salad dressings. Salsa. Other foods Salted popcorn and pretzels. Corn chips and puffs. Potato and tortilla chips. Canned or dried soups. Pizza. Frozen entrees and pot pies. The items listed above may not be a  complete list of foods and beverages you should avoid. Contact a dietitian for more information. Summary Eating less sodium can help lower your blood pressure, reduce swelling, and protect your heart, liver, and kidneys. Most people on this plan should limit their sodium intake to  1,500-2,000 mg (milligrams) of sodium each day. Canned, boxed, and frozen foods are high in sodium. Restaurant foods, fast foods, and pizza are also very high in sodium. You also get sodium by adding salt to food. Try to cook at home, eat more fresh fruits and vegetables, and eat less fast food and canned, processed, or prepared foods. This information is not intended to replace advice given to you by your health care provider. Make sure you discuss any questions you have with your health care provider. Document Revised: 04/28/2019 Document Reviewed: 02/22/2019 Elsevier Patient Education  2022 [employer].

## 2021-06-17 ENCOUNTER — Other Ambulatory Visit: Payer: Self-pay

## 2021-06-17 ENCOUNTER — Encounter (HOSPITAL_BASED_OUTPATIENT_CLINIC_OR_DEPARTMENT_OTHER): Payer: Medicare Other | Admitting: Internal Medicine

## 2021-06-18 ENCOUNTER — Encounter: Payer: Self-pay | Admitting: Cardiovascular Disease

## 2021-06-18 ENCOUNTER — Telehealth: Payer: Self-pay

## 2021-06-18 NOTE — Telephone Encounter (Signed)
Pt monitor has been ordered. She should receive it in 7-10 business days.  ?
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
## 2021-06-21 ENCOUNTER — Other Ambulatory Visit: Payer: Self-pay | Admitting: Physician Assistant

## 2021-06-21 ENCOUNTER — Telehealth: Payer: Self-pay | Admitting: Physician Assistant

## 2021-06-21 NOTE — Telephone Encounter (Signed)
Pt daughter called stating the patient has taken a turn and is worsening. O2 sat on room air at recent OV was 90%. Home health came into her independent living today and noted an O2 of 86%. They apparently sent an order to PCP to eval for home O2. The daughter is very concerned and would like to get O2 this weekend. I do not see a documented O2 in Epic. I will attempt to order, but I do not think we will be able to get O2 without help with a DME company and proper documentation in the chart. She also described weeping in her legs and we discussed possibly increasing her torsemide further just to get her through the weekend. We discussed different strategies and she will try 40 mg torsemide in the morning and 30 mg in the afternoon. I reviewed her recent BMP. I also mentioned possibly getting help from in-home hospice this weekend should she decline further.  ? ?I will send message to Dr. Sallyanne Kuster for further recommendations. Ms. Kelsey Joseph thanked me for the call.  ?

## 2021-06-23 NOTE — Telephone Encounter (Signed)
OK to increase the torsemide to 40 mg twice daily if necessary for fluid retention and shortness of breath. ?

## 2021-06-24 ENCOUNTER — Telehealth: Payer: Self-pay | Admitting: Cardiovascular Disease

## 2021-06-24 NOTE — Telephone Encounter (Signed)
Patient's daughter called stating her mother is a resident at [facility], she is trying to get her mother oxygen.  However is having a hard time getting it for her mother.  She states she had two different professionals come and do reading of her mother oxygen level.  One was 90 the other one was 88.  She is wondering if there is a possibility we would have a pulse oximeter she would be able to purchase from [place].  She did state she was going to go buy one from the drug store.   ?

## 2021-06-24 NOTE — Telephone Encounter (Signed)
Called pt's daughter, Malachy Mood. She states she spoke to someone and they will be delivering oxygen and a pulse ox to her mother today. "So I guess we are good. Thank you for reaching back out to me tho." No further questions or concerns at this time.  ?

## 2021-06-25 ENCOUNTER — Telehealth: Payer: Self-pay | Admitting: Cardiovascular Disease

## 2021-06-25 NOTE — Telephone Encounter (Signed)
Daughter Malachy Mood asked about torsemide dosage. She stated patient's leg is less swollen. She is concerned about patient's kidney function. Daughter stated patient was started on oxygen at 2L/m. Patient has "color backi, s breathing better, and is not listless." ?Shared this message from Dr. Sallyanne Kuster ?"OK to increase the torsemide to 40 mg twice daily if necessary for fluid retention and shortness of breath."  ?  ?The dose of torsemide will go back to 30 mg twice daily, unless there is fluid retention and sob. ?Daughter stated hospice will evaluate patient tomorrow for hospice care. ?

## 2021-06-25 NOTE — Telephone Encounter (Signed)
?  Pt c/o medication issue: ? ?1. Name of Medication: torsemide (DEMADEX) 20 MG tablet ? ?2. How are you currently taking this medication (dosage and times per day)? Take 1.5 tablets (30 mg total) by mouth 2 (two) times daily. ? ?3. Are you having a reaction (difficulty breathing--STAT)?  ? ?4. What is your medication issue? Pt's daughter is concern about pt is getting dehydrated because pt is not drinking enough water and wondering pt can lower the dose of her torsemide or completely discontinued it  ?

## 2021-06-25 NOTE — Telephone Encounter (Signed)
At the current stage of her illness, I think the priority is to treat her symptoms, especially shortness of breath.  I would worry less about the kidney function as long as we are removing enough fluid to where she can breathe comfortably.  Sure the consultation with hospice will be beneficial. ?

## 2021-06-27 NOTE — Telephone Encounter (Signed)
Left a message for the daughter to call back if anything further is needed.  ?

## 2021-07-28 ENCOUNTER — Ambulatory Visit: Payer: Medicare Other | Admitting: Cardiovascular Disease

## 2021-08-04 DEATH — deceased

## 2021-08-05 ENCOUNTER — Encounter: Payer: Self-pay | Admitting: Cardiovascular Disease

## 2021-08-20 ENCOUNTER — Telehealth: Payer: Self-pay | Admitting: Cardiovascular Disease

## 2021-08-20 NOTE — Telephone Encounter (Signed)
-  Pt's daughter called stating she received a call from Dr. Lurline Del regarding her mother's long term benefits, but wanted to make him aware her mother has passed away. She also wanted to make MD aware he was her mother's favorite physician and she really appreciate the care provided to her mother. ?-Nurse offered her condolence and let daughter know our office family will be thinking of them.   ? ? ?Will forward to MD and medical records to make aware.  ?

## 2021-08-20 NOTE — Telephone Encounter (Signed)
Patient daughter called stating she received a from Dr. Sallyanne Kuster today, telling her he received a request for long term benefits request. He gave her a number to call back at [phone], but she gets a recording saying this number is no longer in service.  ? ? ? ?
